# Patient Record
Sex: Male | Born: 1960 | Race: Black or African American | Hispanic: No | Smoking: Never smoker
Health system: Southern US, Community
[De-identification: ages and names within clinical notes are randomized; demographics above are authoritative.]

## PROBLEM LIST (undated history)

## (undated) DIAGNOSIS — I10 Essential (primary) hypertension: Secondary | ICD-10-CM

## (undated) DIAGNOSIS — R519 Headache, unspecified: Secondary | ICD-10-CM

## (undated) DIAGNOSIS — K219 Gastro-esophageal reflux disease without esophagitis: Secondary | ICD-10-CM

## (undated) DIAGNOSIS — G4730 Sleep apnea, unspecified: Secondary | ICD-10-CM

## (undated) DIAGNOSIS — K922 Gastrointestinal hemorrhage, unspecified: Secondary | ICD-10-CM

## (undated) DIAGNOSIS — R079 Chest pain, unspecified: Secondary | ICD-10-CM

## (undated) DIAGNOSIS — N400 Enlarged prostate without lower urinary tract symptoms: Secondary | ICD-10-CM

## (undated) DIAGNOSIS — I4891 Unspecified atrial fibrillation: Secondary | ICD-10-CM

## (undated) DIAGNOSIS — J449 Chronic obstructive pulmonary disease, unspecified: Secondary | ICD-10-CM

## (undated) DIAGNOSIS — D573 Sickle-cell trait: Secondary | ICD-10-CM

## (undated) DIAGNOSIS — I471 Supraventricular tachycardia, unspecified: Secondary | ICD-10-CM

## (undated) DIAGNOSIS — M1990 Unspecified osteoarthritis, unspecified site: Secondary | ICD-10-CM

## (undated) DIAGNOSIS — E041 Nontoxic single thyroid nodule: Secondary | ICD-10-CM

## (undated) DIAGNOSIS — I499 Cardiac arrhythmia, unspecified: Secondary | ICD-10-CM

## (undated) HISTORY — PX: ATRIAL FIBRILLATION ABLATION: EP1191

## (undated) HISTORY — PX: BACK SURGERY: SHX140

## (undated) HISTORY — PX: TRANSURETHRAL RESECTION OF PROSTATE: SHX73

## (undated) HISTORY — PX: TONSILLECTOMY: SUR1361

## (undated) NOTE — *Deleted (*Deleted)
Cardiology Admission History and Physical:   Patient ID: [name] MRN: [id number]; DOB: Jul 22, 1960   Admission date: 02/14/2020  Primary Care Provider: Dois Davenport, MD [facility] Cardiologist: Noah Delaine, MD  [facility] Electrophysiologist:  None ***  Chief Complaint:  ***  Patient Profile:   [name] is a 85 y.o. male with a hx of paroxysmal atrial fibrillation/atrial tachycardia s/p ablation 2018>> redo in 2021 with PVI plus CTI on sotalol and Eliquis, TIA, HTN, and COPD who presented to [facility] 02/14/20 with chest pain.   History of Present Illness:   [name] is a 6yo M with a hx as stated above who presented to [facility] 02/14/20 with chest pain. Per chart review the patient called his cardiologists office today with complaints of chest pain with associated nausea and abdominal pain which began on the morning of presentation.    He has been seen by our service in hospital consultation 04/28/19 for the evaluation of SVT after being admitted with syncope, anemia and GI bleed. His Xarelto has been held. He was noted to have a rapid, narrow complex tachycardia, which is likely SVT. No recurrent afib has been noted. CHADVASC score of 3. Followed by cardiology at [facility]. Echo here shows a hyperdynamic ventricle, probably d/t anemia. BP appears controlled today. He is currently on carvedilol. I agree that we should restart sotalol 40 mg BID and can restart amlodipine for BP control. On exam he is pleasant, in NAD, lungs clear, RRR, s1/s2, no M/R/G's, abdomen soft, non-tender, no LE edema, 2+ pulses. EKG on 1/18 shows normal QTc.  [name] had breakthrough SVT - will restart home sotalol at treatment dose (80 mg BID) and stop coreg. Restart amlodipine (at lower 5 mg dose) for additional BP control as sotalol does not do that. Would recommend follow-up with his cardiologist at [facility] after discharge. Agree with holding off on anticoagulation for at  least 1 week and could consider switch to Eliquis after d/c   Past Medical History:  Diagnosis Date  . A-fib (HCC)   . Chest pain    a. 11/2002 Cath: EF 68%, LM nl, LAD small, nl, LCX nl, RCA tortuous/nl;  b. 08/2011 St Echo: EF 60%, normal contractility, no scar/ischemia.  Marland Kitchen COPD (chronic obstructive pulmonary disease) (HCC)   . Enlarged prostate   . GERD (gastroesophageal reflux disease)   . GI bleeding 08/2018  . Hypertension   . Sickle cell trait (HCC)   . Sleep apnea   . SVT (supraventricular tachycardia) (HCC)     Past Surgical History:  Procedure Laterality Date  . ATRIAL FIBRILLATION ABLATION    . BIOPSY  08/27/2018   Procedure: BIOPSY;  Surgeon: Tressia Danas, MD;  Location: [facility] ENDOSCOPY;  Service: Gastroenterology;;  . BIOPSY  08/28/2018   Procedure: BIOPSY;  Surgeon: Tressia Danas, MD;  Location: [facility] ENDOSCOPY;  Service: Gastroenterology;;  . COLONOSCOPY WITH PROPOFOL N/A 08/28/2018   Procedure: COLONOSCOPY WITH PROPOFOL;  Surgeon: Tressia Danas, MD;  Location: [facility] ENDOSCOPY;  Service: Gastroenterology;  Laterality: N/A;  . ENTEROSCOPY N/A 08/27/2018   Procedure: ENTEROSCOPY;  Surgeon: Tressia Danas, MD;  Location: [facility] ENDOSCOPY;  Service: Gastroenterology;  Laterality: N/A;  . GIVENS CAPSULE STUDY N/A 08/28/2018   Procedure: GIVENS CAPSULE STUDY;  Surgeon: Tressia Danas, MD;  Location: [facility] ENDOSCOPY;  Service: Gastroenterology;  Laterality: N/A;  . LEFT HEART CATHETERIZATION WITH CORONARY ANGIOGRAM N/A 03/11/2013   Procedure: LEFT HEART CATHETERIZATION WITH CORONARY ANGIOGRAM;  Surgeon: Dolores Patty, MD;  [facility] C[facility];  Service: Cardiovascular;  Laterality: N/A;  . POLYPECTOMY  08/28/2018   Procedure: POLYPECTOMY;  Surgeon: Tressia Danas, MD;  Location: [facility] ENDOSCOPY;  Service: Gastroenterology;;  . TONSILLECTOMY    . TRANSURETHRAL RESECTION OF PROSTATE       Medications Prior to Admission: Prior to Admission medications   Medication Sig  Start Date End Date Taking? Authorizing Provider  acetaminophen (TYLENOL) 325 MG tablet Take 650 mg by mouth every 6 (six) hours as needed for moderate pain.    [provider]  amLODipine (NORVASC) 5 MG tablet Take 1 tablet (5 mg total) by mouth daily. 04/30/19 05/30/19  Darlin Drop, DO  apixaban (ELIQUIS) 5 MG TABS tablet Take 1 tablet (5 mg total) by mouth 2 (two) times daily. Take Eliquis 5 mg twice a day one week after GI bleed.  Do not restart Xarelto. 05/06/19   Darlin Drop, DO  cetirizine (ZYRTEC) 10 MG tablet Take 10 mg by mouth daily as needed for allergies.     [provider]  ferrous sulfate 325 (65 FE) MG tablet Take 1 tablet (325 mg total) by mouth daily with breakfast. 04/30/19 06/29/19  Darlin Drop, DO  fluticasone (FLONASE) 50 MCG/ACT nasal spray Place 1 spray into both nostrils daily as needed for allergies or rhinitis.    [provider]  Fluticasone-Salmeterol (ADVAIR) 250-50 MCG/DOSE AEPB Inhale 1 puff into the lungs 2 (two) times daily as needed (wheezing).    [provider]  nitroGLYCERIN (NITROSTAT) 0.4 MG SL tablet Place 1 tablet (0.4 mg total) under the tongue every 5 (five) minutes as needed for chest pain. 03/12/13   Leroy Sea, MD  ondansetron (ZOFRAN) 8 MG tablet Take 8 mg by mouth every 8 (eight) hours as needed for nausea or vomiting.  10/20/18   [provider]  pantoprazole (PROTONIX) 40 MG tablet Take 40 mg by mouth daily. 07/28/14   [provider]  polyethylene glycol (MIRALAX / GLYCOLAX) 17 g packet Take 17 g by mouth daily. 04/30/19   Darlin Drop, DO  sildenafil (REVATIO) 20 MG tablet TAKE 3-5 TABLETS BY MOUTH AS DIRECTED 11/25/18   [provider]  sotalol (BETAPACE) 80 MG tablet Take 1 tablet (80 mg total) by mouth every 12 (twelve) hours. 04/29/19 05/29/19  Darlin Drop, DO  sucralfate (CARAFATE) 1 g tablet Take 1 tablet (1 g total) by mouth 4 (four) times daily -  with meals and at  bedtime. 08/24/19   Rancour, Jeannett Senior, MD  traMADol (ULTRAM) 50 MG tablet Take 50 mg by mouth every 6 (six) hours as needed for moderate pain.    [provider]  gabapentin (NEURONTIN) 300 MG capsule Take 300 mg by mouth at bedtime.  02/26/19  [provider]     Allergies:   No Known Allergies  Social History:   Social History   Socioeconomic History  . Marital status: Divorced    Spouse name: Not on file  . Number of children: Not on file  . Years of education: Not on file  . Highest education level: Not on file  Occupational History  . Not on file  Tobacco Use  . Smoking status: Never Smoker  . Smokeless tobacco: Never Used  Vaping Use  . Vaping Use: Never used  Substance and Sexual Activity  . Alcohol use: No  . Drug use: Never  . Sexual activity: Not on file  Other Topics Concern  . Not on  file  Social History Narrative   Lives in [place] with dtr.   Social Determinants of Health   Financial Resource Strain:   . Difficulty of Paying Living Expenses: Not on file  Food Insecurity:   . Worried About Programme researcher, broadcasting/film/video in the Last Year: Not on file  . Ran Out of Food in the Last Year: Not on file  Transportation Needs:   . Lack of Transportation (Medical): Not on file  . Lack of Transportation (Non-Medical): Not on file  Physical Activity:   . Days of Exercise per Week: Not on file  . Minutes of Exercise per Session: Not on file  Stress:   . Feeling of Stress : Not on file  Social Connections:   . Frequency of Communication with Friends and Family: Not on file  . Frequency of Social Gatherings with Friends and Family: Not on file  . Attends Religious Services: Not on file  . Active Member of Clubs or Organizations: Not on file  . Attends Banker Meetings: Not on file  . Marital Status: Not on file  Intimate Partner Violence:   . Fear of Current or Ex-Partner: Not on file  . Emotionally Abused: Not on file  . Physically Abused: Not  on file  . Sexually Abused: Not on file    Family History:  *** The patient's family history includes CAD in his mother; Multiple myeloma in his brother; Sickle cell anemia in his father. There is no history of Gastric cancer.    ROS:  Please see the history of present illness.  ***All other ROS reviewed and negative.     Physical Exam/Data:   Vitals:   02/14/20 1715 02/14/20 1730 02/14/20 1745 02/14/20 1815  BP: 110/80 99/78 101/80 (!) 118/93  Pulse: (!) 56 (!) 55 (!) 54 (!) 56  Resp: (!) 24 15 17 16   Temp:      TempSrc:      SpO2: 98% 99% 98% 99%  Weight:      Height:       No intake or output data in the 24 hours ending 02/14/20 1844 Last 3 Weights 02/14/2020 02/14/2020 08/23/2019  Weight (lbs) 210 lb 206 lb 216 lb 0.8 oz  Weight (kg) 95.255 kg 93.441 kg 98 kg     Body mass index is 29.29 kg/m.  General:  Well nourished, well developed, in no acute distress*** HEENT: normal Lymph: no adenopathy Neck: no*** JVD Endocrine:  No thryomegaly Vascular: No carotid bruits; FA pulses 2+ bilaterally without bruits  Cardiac:  normal S1, S2; RRR; no murmur *** Lungs:  clear to auscultation bilaterally, no wheezing, rhonchi or rales  Abd: soft, nontender, no hepatomegaly  Ext: no*** edema Musculoskeletal:  No deformities, BUE and BLE strength normal and equal Skin: warm and dry  Neuro:  CNs 2-12 intact, no focal abnormalities noted Psych:  Normal affect    EKG:  The ECG that was done *** was personally reviewed and demonstrates ***  Relevant CV Studies:  Echo 11/26/19:  The left ventricular size is normal.Mild left ventricular hypertrophy Left ventricular systolic function is normal. LV ejection fraction = 55-60%. The left ventricular wall motion is normal. Left ventricular filling pattern is prolonged relaxation. The right ventricle is normal in size and function. The left atrial size is normal. There is no significant valvular stenosis or regurgitation. There was  insufficient TR detected to calculate RV systolic pressure. The aortic sinus is normal size. IVC size was normal. There is  no pericardial effusion. There is no significant change in comparison with the last study.   Laboratory Data:  High Sensitivity Troponin:   Recent Labs  Lab 02/14/20 1514  TROPONINIHS 8      Chemistry Recent Labs  Lab 02/14/20 1514  NA 141  K 3.1*  CL 104  CO2 26  GLUCOSE 86  BUN 15  CREATININE 1.33*  CALCIUM 9.8  GFRNONAA >60  ANIONGAP 11    No results for input(s): PROT, ALBUMIN, AST, ALT, ALKPHOS, BILITOT in the last 168 hours. Hematology Recent Labs  Lab 02/14/20 1514  WBC 6.5  RBC 5.27  HGB 15.2  HCT 45.0  MCV 85.4  MCH 28.8  MCHC 33.8  RDW 12.9  PLT 219   BNPNo results for input(s): BNP, PROBNP in the last 168 hours.  DDimer No results for input(s): DDIMER in the last 168 hours.   Radiology/Studies:  DG Chest Port 1 View  Result Date: 02/14/2020 CLINICAL DATA:  Chest pain for 1 day EXAM: PORTABLE CHEST 1 VIEW COMPARISON:  08/23/2019 FINDINGS: The heart size and mediastinal contours are within normal limits. Both lungs are clear. The visualized skeletal structures are unremarkable. IMPRESSION: No active disease. Electronically Signed   By: Sharlet Salina M.D.   On: 02/14/2020 16:10     Assessment and Plan:   1. ***  {Complete the following score calculators/questions which help meet required metrics.  Press F2         :[id number]}   {Is the patient being seen for CHEST PAIN, UNSTABLE ANGINA, NSTEMI or STEMI?     :[id number]}  {Does this patient have CHF or CHF symptoms?      :[id number]} {Does this patient have ATRIAL FIBRILLATION?:[id number]} Severity of Illness: {Observation/Inpatient:21159}   For questions or updates, please contact [facility] Please consult [email or web address] for contact info under     Signed, Georgie Chard, NP  02/14/2020 6:44 PM

---
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
## 1998-02-15 ENCOUNTER — Encounter: Payer: Self-pay | Admitting: Family Medicine

## 1998-02-15 ENCOUNTER — Ambulatory Visit (HOSPITAL_COMMUNITY): Admission: RE | Admit: 1998-02-15 | Discharge: 1998-02-15 | Payer: Self-pay | Admitting: Family Medicine

## 1998-09-10 ENCOUNTER — Encounter: Payer: Self-pay | Admitting: Emergency Medicine

## 1998-09-11 ENCOUNTER — Inpatient Hospital Stay (HOSPITAL_COMMUNITY): Admission: EM | Admit: 1998-09-11 | Discharge: 1998-09-11 | Payer: Self-pay | Admitting: Emergency Medicine

## 1998-09-12 ENCOUNTER — Inpatient Hospital Stay (HOSPITAL_COMMUNITY): Admission: EM | Admit: 1998-09-12 | Discharge: 1998-09-14 | Payer: Self-pay | Admitting: Emergency Medicine

## 1998-09-13 ENCOUNTER — Encounter: Payer: Self-pay | Admitting: Interventional Cardiology

## 2000-02-02 ENCOUNTER — Emergency Department (HOSPITAL_COMMUNITY): Admission: EM | Admit: 2000-02-02 | Discharge: 2000-02-02 | Payer: Self-pay | Admitting: Emergency Medicine

## 2000-02-05 ENCOUNTER — Encounter: Admission: RE | Admit: 2000-02-05 | Discharge: 2000-02-05 | Payer: Self-pay | Admitting: Family Medicine

## 2000-02-05 ENCOUNTER — Encounter: Payer: Self-pay | Admitting: Family Medicine

## 2000-03-08 ENCOUNTER — Encounter: Payer: Self-pay | Admitting: Family Medicine

## 2000-03-08 ENCOUNTER — Encounter: Admission: RE | Admit: 2000-03-08 | Discharge: 2000-03-08 | Payer: Self-pay | Admitting: Family Medicine

## 2001-02-20 ENCOUNTER — Encounter: Admission: RE | Admit: 2001-02-20 | Discharge: 2001-02-20 | Payer: Self-pay | Admitting: Family Medicine

## 2001-02-20 ENCOUNTER — Encounter: Payer: Self-pay | Admitting: Family Medicine

## 2002-05-05 ENCOUNTER — Ambulatory Visit (HOSPITAL_BASED_OUTPATIENT_CLINIC_OR_DEPARTMENT_OTHER): Admission: RE | Admit: 2002-05-05 | Discharge: 2002-05-05 | Payer: Self-pay | Admitting: Neurology

## 2002-11-16 ENCOUNTER — Encounter: Payer: Self-pay | Admitting: Emergency Medicine

## 2002-11-16 ENCOUNTER — Inpatient Hospital Stay (HOSPITAL_COMMUNITY): Admission: EM | Admit: 2002-11-16 | Discharge: 2002-11-19 | Payer: Self-pay | Admitting: Emergency Medicine

## 2002-11-17 ENCOUNTER — Encounter: Payer: Self-pay | Admitting: Internal Medicine

## 2002-11-20 ENCOUNTER — Inpatient Hospital Stay (HOSPITAL_COMMUNITY): Admission: EM | Admit: 2002-11-20 | Discharge: 2002-11-23 | Payer: Self-pay | Admitting: Emergency Medicine

## 2002-11-20 ENCOUNTER — Encounter: Payer: Self-pay | Admitting: Internal Medicine

## 2002-11-21 ENCOUNTER — Encounter: Payer: Self-pay | Admitting: Internal Medicine

## 2003-10-25 ENCOUNTER — Encounter (INDEPENDENT_AMBULATORY_CARE_PROVIDER_SITE_OTHER): Payer: Self-pay | Admitting: Interventional Cardiology

## 2003-10-26 ENCOUNTER — Inpatient Hospital Stay (HOSPITAL_COMMUNITY): Admission: EM | Admit: 2003-10-26 | Discharge: 2003-10-27 | Payer: Self-pay

## 2004-01-28 ENCOUNTER — Emergency Department (HOSPITAL_COMMUNITY): Admission: EM | Admit: 2004-01-28 | Discharge: 2004-01-28 | Payer: Self-pay | Admitting: Emergency Medicine

## 2004-06-11 DIAGNOSIS — R7301 Impaired fasting glucose: Secondary | ICD-10-CM | POA: Insufficient documentation

## 2007-09-15 DIAGNOSIS — N400 Enlarged prostate without lower urinary tract symptoms: Secondary | ICD-10-CM | POA: Insufficient documentation

## 2007-11-16 ENCOUNTER — Encounter: Admission: RE | Admit: 2007-11-16 | Discharge: 2007-12-16 | Payer: Self-pay | Admitting: Family Medicine

## 2007-11-20 ENCOUNTER — Emergency Department (HOSPITAL_BASED_OUTPATIENT_CLINIC_OR_DEPARTMENT_OTHER): Admission: EM | Admit: 2007-11-20 | Discharge: 2007-11-20 | Payer: Self-pay | Admitting: Emergency Medicine

## 2007-12-16 DIAGNOSIS — E041 Nontoxic single thyroid nodule: Secondary | ICD-10-CM | POA: Insufficient documentation

## 2008-04-18 ENCOUNTER — Emergency Department (HOSPITAL_BASED_OUTPATIENT_CLINIC_OR_DEPARTMENT_OTHER): Admission: EM | Admit: 2008-04-18 | Discharge: 2008-04-18 | Payer: Self-pay | Admitting: Emergency Medicine

## 2008-04-18 ENCOUNTER — Ambulatory Visit: Payer: Self-pay | Admitting: Diagnostic Radiology

## 2009-05-06 DIAGNOSIS — E782 Mixed hyperlipidemia: Secondary | ICD-10-CM | POA: Insufficient documentation

## 2009-05-07 ENCOUNTER — Ambulatory Visit: Payer: Self-pay | Admitting: Radiology

## 2009-05-07 ENCOUNTER — Emergency Department (HOSPITAL_BASED_OUTPATIENT_CLINIC_OR_DEPARTMENT_OTHER): Admission: EM | Admit: 2009-05-07 | Discharge: 2009-05-07 | Payer: Self-pay | Admitting: Emergency Medicine

## 2009-05-09 ENCOUNTER — Emergency Department (HOSPITAL_COMMUNITY): Admission: EM | Admit: 2009-05-09 | Discharge: 2009-05-10 | Payer: Self-pay | Admitting: Emergency Medicine

## 2009-05-17 DIAGNOSIS — J4530 Mild persistent asthma, uncomplicated: Secondary | ICD-10-CM | POA: Insufficient documentation

## 2010-02-23 ENCOUNTER — Encounter: Payer: Self-pay | Admitting: Internal Medicine

## 2010-06-24 LAB — URINALYSIS, ROUTINE W REFLEX MICROSCOPIC
Bilirubin Urine: NEGATIVE
Glucose, UA: NEGATIVE mg/dL
Hgb urine dipstick: NEGATIVE
Ketones, ur: NEGATIVE mg/dL
Nitrite: NEGATIVE
Protein, ur: NEGATIVE mg/dL
Specific Gravity, Urine: 1.012 (ref 1.005–1.030)
Urobilinogen, UA: 0.2 mg/dL (ref 0.0–1.0)
pH: 6 (ref 5.0–8.0)

## 2010-06-27 LAB — POCT CARDIAC MARKERS
CKMB, poc: <1 ng/mL — ABNORMAL LOW (ref 1.0–8.0)
CKMB, poc: <1 ng/mL — ABNORMAL LOW (ref 1.0–8.0)
Myoglobin, poc: 109 ng/mL (ref 12–200)
Myoglobin, poc: 125 ng/mL (ref 12–200)
Troponin i, poc: <0.05 ng/mL (ref 0.00–0.09)
Troponin i, poc: <0.05 ng/mL (ref 0.00–0.09)

## 2010-06-27 LAB — POCT I-STAT, CHEM 8
BUN: 12 mg/dL (ref 6–23)
Calcium, Ion: 1.1 mmol/L — ABNORMAL LOW (ref 1.12–1.32)
Chloride: 106 mEq/L (ref 96–112)
Creatinine, Ser: 1.5 mg/dL (ref 0.4–1.5)
Glucose, Bld: 95 mg/dL (ref 70–99)
HCT: 43 % (ref 39.0–52.0)
Hemoglobin: 14.6 g/dL (ref 13.0–17.0)
Potassium: 3.5 mEq/L (ref 3.5–5.1)
Sodium: 142 mEq/L (ref 135–145)
TCO2: 28 mmol/L (ref 0–100)

## 2010-06-27 LAB — DIFFERENTIAL
Basophils Absolute: 0 10*3/uL (ref 0.0–0.1)
Basophils Relative: 1 % (ref 0–1)
Eosinophils Absolute: 0.1 10*3/uL (ref 0.0–0.7)
Eosinophils Relative: 2 % (ref 0–5)
Lymphocytes Relative: 18 % (ref 12–46)
Lymphs Abs: 1.1 10*3/uL (ref 0.7–4.0)
Monocytes Absolute: 1.1 10*3/uL — ABNORMAL HIGH (ref 0.1–1.0)
Monocytes Relative: 18 % — ABNORMAL HIGH (ref 3–12)
Neutro Abs: 3.6 10*3/uL (ref 1.7–7.7)
Neutrophils Relative %: 62 % (ref 43–77)

## 2010-06-27 LAB — URINALYSIS, ROUTINE W REFLEX MICROSCOPIC
Bilirubin Urine: NEGATIVE
Glucose, UA: NEGATIVE mg/dL
Ketones, ur: NEGATIVE mg/dL
Leukocytes, UA: NEGATIVE
Nitrite: NEGATIVE
Protein, ur: 30 mg/dL — AB
Specific Gravity, Urine: 1.016 (ref 1.005–1.030)
Urobilinogen, UA: 0.2 mg/dL (ref 0.0–1.0)
pH: 6 (ref 5.0–8.0)

## 2010-06-27 LAB — URINE MICROSCOPIC-ADD ON

## 2010-06-27 LAB — CBC
HCT: 42.5 % (ref 39.0–52.0)
Hemoglobin: 14.4 g/dL (ref 13.0–17.0)
MCHC: 33.8 g/dL (ref 30.0–36.0)
MCV: 87.3 fL (ref 78.0–100.0)
Platelets: 188 10*3/uL (ref 150–400)
RBC: 4.86 MIL/uL (ref 4.22–5.81)
RDW: 13.1 % (ref 11.5–15.5)
WBC: 5.9 10*3/uL (ref 4.0–10.5)

## 2010-07-23 LAB — URINE MICROSCOPIC-ADD ON

## 2010-07-23 LAB — URINALYSIS, ROUTINE W REFLEX MICROSCOPIC
Bilirubin Urine: NEGATIVE
Glucose, UA: NEGATIVE mg/dL
Ketones, ur: NEGATIVE mg/dL
Leukocytes, UA: NEGATIVE
Nitrite: NEGATIVE
Protein, ur: NEGATIVE mg/dL
Specific Gravity, Urine: 1.019 (ref 1.005–1.030)
Urobilinogen, UA: 0.2 mg/dL (ref 0.0–1.0)
pH: 5.5 (ref 5.0–8.0)

## 2010-08-24 NOTE — H&P (Signed)
NAMEMarland Kitchen  KEDRON, UNO NO.:  [id number]   MEDICAL RECORD NO.:  [id number]                   PATIENT TYPE:  EMS   LOCATION:  [facility]                                 FACILITY:  [facility]   PHYSICIAN:  Hollice Espy, M.D.            DATE OF BIRTH:  [birth year]   DATE OF ADMISSION:  10/25/2003  DATE OF DISCHARGE:                                HISTORY & PHYSICAL   PRIMARY CARE PHYSICIAN:  Unassigned, patient previously used to see Dr.  Chales Salmon. Abigail Miyamoto but has not seen him for a while and says he is seeing a  new doctor but he cannot remember who.   CHIEF COMPLAINT:  Syncope.   HISTORY OF PRESENT ILLNESS:  This is a 50 year old white malehite male with past  medical history of chest pain and hypertension, who had a normal cardiac  catheterization done in December of 2004 at this hospital and presents with  syncope.  The patient states he has been previously well with no complaints,  no recent illnesses, no sick contacts, when all of a sudden this evening, he  started feeling lightheaded and dizzy.  He denies any nausea or vomiting, he  denied any vertigo symptoms, he denied any chest pain or shortness of breath  but felt like he had to sit down.  He was given some water, which he took a  few sips of this.  He tried to then stand up and walk; the next thing he  remembers, he remembers just going down.  He is not sure for the length of  time he was out, however, family was present and did attend to him.  He does  not remember much about the events after that.  He denied any problems with  slurred speech or weakness or confusion, but just has very little memory of  the incident.  Unfortunately, the family is not present in the emergency  room, nor the attending ER physician who saw the patient.  The patient was  transported to [facility].  An EKG done showed normal sinus rhythm  with a left anterior fascicular block, which, when compared to previous  EKGs,  appears normal.  The patient's vitals were checked and he was with a  heart rate of 71, with a blood pressure of 136/90 on admission; these did  not change.  Orthostatics were checked and found to be normal.  CT of the  head as well as chest x-ray were checked and found to be normal.  The  patient had no elevated white count; he did have a slightly low potassium of  2.7 but the rest of his electrolytes were normal and there was nothing else  of regard.  The patient was admitted, started on IV fluids and currently he  states he is feeling okay.  He denies any complaints.  He denies any  headaches, visual changes, chest pain, palpitations, shortness of breath,  wheeze, cough, abdominal pain, hematuria, dysuria, constipation, diarrhea or  weakness.  He just tells me that he is tired, however, it is midnight.  The  patient also reports that he has had intermittent episodes of chest pain  going on for the past few months but these have been never long-lasting and  he did not have any episodes today.   PAST MEDICAL HISTORY:  1. Past medical history is for previous episode of chest pain, status post     cardiac catheterization done in December which was completely normal.  2. He also has a history of hypertension.  3. The patient has a reported history of TIAs seen on previous discharge     summaries; he did not mention a history of TIAs.   MEDICATIONS:  The patient tells me he takes 2 blood pressure agents; one is  hydrochlorothiazide, the other he cannot remember the name of.   ALLERGIES:  He has no known drug allergies.   SOCIAL HISTORY:  He denies any tobacco, alcohol or drug use.   FAMILY HISTORY:  He has had a mother and grandmother with CAD but no one at  a young age.   PHYSICAL EXAM:  VITAL SIGNS:  Patient's temperature 98.6, heart rate 71,  blood pressure 136/90, respirations 18, O2 saturation 100% on 2 L.  GENERAL:  In general, he is alert and oriented, in no apparent distress.   HEENT:  Normocephalic, atraumatic.  His mucous membranes are moist.  NECK:  He has no carotid bruits.  HEART:  Regular rate and rhythm, S1 and he has a loud S2.  LUNGS:  Lungs are clear to auscultation bilaterally.  ABDOMEN:  Abdomen is soft, nontender and nondistended.  Positive bowel  sounds.  EXTREMITIES:  No clubbing, cyanosis or edema.  NEUROLOGICAL:  His cranial nerves are all intact.  He has no focal extremity  deficits or weaknesses.   LABORATORY WORK:  CT of his head and chest x-ray are both normal.   Sodium 138, potassium 2.7, chloride 108, bicarb 22, BUN 10, creatinine 1.3,  glucose 101.  His LFTs are all within normal limits.  White count 7.7,  hemoglobin and hematocrit 14.2 and 41.8, MCV of 85, platelet count 226,000;  he has no shift.  CPK and MB are 96.3 and 1.1; second set 115 and 1.4; both  troponins are less than 0.05.   ASSESSMENT AND PLAN:  1. Syncope.  This may be some mild dehydration secondary to his     antihypertensives.  In talking to him, I feel that he does not supposedly     follow with a primary care physician and likely just takes his scheduled     doses of medicine which he says have been good for him for the past few     years.  Will admit for a 24-hour observation, check carotid Dopplers and     echocardiogram, as there are no reports of these in his chart, to rule     out stenosis or arrhythmia, but likely this may be from blood pressure     agents alone.  We will continue to check his vitals as well.  2. In regards to his chest pain episodes, even though with his previous     history and a normal catheterization back in December, I would not pursue     this further for right now.  3. In regards to hypokalemia, I will go ahead and replace.  Hollice Espy, M.D.    SKK/MEDQ  D:  10/25/2003  T:  10/25/2003  Job:  [id number]

## 2010-08-24 NOTE — Cardiovascular Report (Signed)
   NAMEVIDAL, LAMPKINS NO.:  [id number]   MEDICAL RECORD NO.:  [id number]                   PATIENT TYPE:  INP   LOCATION:  4733                                 FACILITY:  [facility]   PHYSICIAN:  Salvadore Farber, M.D.             DATE OF BIRTH:  [birth year]   DATE OF PROCEDURE:  11/18/2002  DATE OF DISCHARGE:                              CARDIAC CATHETERIZATION   PROCEDURE:  Left heart catheterization, left ventriculography, coronary  angiography.   INDICATION:  Mr. 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  No prior history of  cardiovascular disease.  He was admitted to hospital complaining of chest  discomfort for five days.  He evolved inferior T-wave inversions and based  on this based on this electrocardiographic abnormality, his complaints of  chest pain, he was  referred for diagnostic angiography.   PROCEDURAL TECHNIQUE:  Informed consent was obtained.  Under 1% lidocaine  local anesthesia, a 6 French sheath was placed in the left femoral artery  using the modified Seldinger technique.  Diagnostic angiography and  ventriculography were performed using JL-4, Amplatz left I and pigtail  catheters.  The patient tolerated the procedure well and was transferred to  the holding room in stable condition.   COMPLICATIONS:  None.   FINDINGS:  1. LV:  128/3/14. EF 68% without regional wall motion abnormality.  2. No aortic stenosis or mitral regurgitation.  3. Left main:  Angiographically normal.  4. LAD:  Small vessel which does not reach the apex of the heart.  It gives     rise to two diagonal branches.  It is angiographically normal.  5. Circumflex:  Moderate size vessel giving rise to two obtuse marginals.     It is angiographically normal.  6. RCA:  Large, dominant vessel with a high anterior takeoff.  The vessel     was tortuous, but angiographically normal.    IMPRESSION/RECOMMENDATIONS:  1. Angiographically normal coronary arteries.  Suspect noncardiac etiology     to this chest discomfort.  2. Normal left ventricular size and systolic function.  3. No aortic stenosis or mitral regurgitation.                                                 Salvadore Farber, M.D.    WED/MEDQ  D:  11/18/2002  T:  11/18/2002  Job:  [id number]   cc:   Chales Salmon. Abigail Miyamoto, M.D.  [address]  [place]  [place] [address]  Fax: [phone]   Jonelle Sidle, M.D. [facility]

## 2010-08-24 NOTE — Discharge Summary (Signed)
[name], [name] NO.:  [id number]   MEDICAL RECORD NO.:  [id number]                   PATIENT TYPE:  INP   LOCATION:  4733                                 FACILITY:  [facility]   PHYSICIAN:  [name], M.D.               DATE OF BIRTH:  [birth year]   DATE OF ADMISSION:  11/16/2002  DATE OF DISCHARGE:  11/19/2002                                 DISCHARGE SUMMARY   CHIEF COMPLAINT:  Chest pains, headache, cough.   CONTINUITY DOCTOR:  [name], M.D.  Phone number [phone].  She is with  [facility].   CONSULTS:  Cardiology by [name].   DISCHARGE DIAGNOSES:  1. Non-cardiac chest pain.  2. Headache of unknown etiology with negative CT scan.  3. Toxic encephalopathy.  4. Constipation.  5. Hypertension.  6. Hemorrhoids.  7. History of tobacco abuse.  8. History of transient ischemic attack.   DISCHARGE MEDICATIONS:  1. Aspirin 81 mg p.o. daily.  2. Cozaar 50 mg p.o. daily.  3. Colace 100 mg p.o. b.i.d.  4. Pepcid 20 mg b.i.d.  5. Tylenol Extra-Strength over-the-counter one to two t.i.d. for headache.   FOLLOWUP:  The patient will schedule appointment with Dr. Eileen Cox at  [facility].  Her phone number is [phone].  The patient was advised to  schedule this appointment within the next few work days.  During this  appointment, the patient's pain due to headaches should be addressed.  The  patient would like something stronger then Tylenol for his almost daily  headache.   The patient also needs followup for his high blood pressure.  His blood  pressure on discharge was 137/92.  He was discharged on Cozaar as well as  his home medication.  The patient states that he cannot remember, but one of  his medications was about to run out.  He was advised to refill that  medication with his new primary care physician during his appointment.   The patient's cause of chest pain was not clear at discharge.  The patient  was  started on Pepcid to treat potential gastroesophageal regurgitation  disease.  At this point, the patient denies symptoms, but his chest pain  could be actually caused by that.   The patient is to follow up with Dr. Montez Cox concerning his ongoing chest  pain in order to determine the potentially non-cardiac causes.    PROCEDURES PERFORMED:  1. Cardiac catheterization performed on November 18, 2002, showed no cardiac     disease, no signs of ischemia, no aortic stenosis, and marked mitral     regurgitation.  Normal left ventricular size and systolic function.  2. CT scan of the head which was negative for bleeding, masses, or signs of     infarction.  3. Chest x-ray on November 16, 2002, which was unremarkable.   CONSULTATIONS:  During his stay, the patient  has been seen by [name],  Department of Cardiology.   ADMITTING HISTORY OF PRESENT ILLNESS:  [name] is a 50 year old African-  American male with a past history of hypertension and distant history of  tobacco abuse, who presented to Urgent Care with five days of sharp,  pinching, left chest pain that lasted about five minutes.  It was on and off  throughout the day, and seems to be worsened by exertion and relieved by  rest.  The patient also reported occasional nausea and diaphoresis which  have occurred together with his pain.  The pain has radiated to his jaw,  neck, and left arm.  The patient denied shortness of breath, dyspnea on  exertion, orthopnea, or paroxysmal nocturnal dyspnea, or any history of  edema.  The patient does report that he has been feeling fatigued for the  past five days as well as having had cough and increased urination.   The patient reports having pressure-like pain that occurs about once a month  and has been going on for the past eight years.  He had been seen prior to  this at [facility] and had cardiac catheterization which showed no  artery disease at that point.  The patient has  also reported a one-month  history of shooting headache which is relieved by Tylenol but associated  with dizziness and blurry vision and occasionally wakes him up at night.   The patient reports having a cough about one week with no upper respiratory  signs.  He has been afebrile, denies any sick contacts.  He does report  chest pain with cough, but this is a different feeling from what he has been  experiencing.   The patient states that for the past week he has had increased urination in  frequency and volume.  No dribbling, no pressure, no dysuria, no abdominal  pain, no hematuria.  On admission to the ED, he had his cardiac enzymes  drawn which were negative.  He had an EKG done which has T-wave changes as  well as possible Q-wave in lead aVL.  He also had rapid repolarization  versus raised ST segments in leads aVL, aVF, and aVR.  There was also  noticed a nonspecific intraventricular conduction loss.   The patient was given nitroglycerin, but reported he developed severe  headache with that and nitroglycerin was stopped.  At this point, the  patient was ready for morphine 1 mg for relief of chest pain.   On admission, his vital signs showed a pulse of 66, blood pressure 148/99,  temperature 98.6, respirations 20, O2 sat 99% on room air.   PHYSICAL EXAM:  CARDIAC EXAM:  Normal.  Normal S1 and S2 sounds.  No  murmurs, rubs, or gallops.  No bruits.  He had excellent radial, dorsalis  pedis, and posterior tibialis pulses.  PULMONARY EXAM:  Clear.  ABDOMINAL EXAM:  Positive for generalized tenderness.  The patient had no  edema.  MUSCULOSKELETAL EXAM:  Within normal limits.  NEUROLOGICAL EXAM:  Within normal limits.   LABS ON ADMISSION:  His sodium was 141, potassium 3.7, glucose 96.  The  patient had a white blood cell count of 6.7, hemoglobin 14.6, hematocrit of  42.5, and platelet count of 231.  The patient's ABG looked strong and showed a pH of 7.42, pCO2 of 42.1, total CO2  of 29.  His three sets of cardiac  enzymes were unremarkable.  The patient had an urinalysis which was  negative, urine cultures have not produced any growth up to this date, not  grown any bacteria.  The patient has been admitted to telemetry to rule out  potential myocardial infarction.   HOSPITAL COURSE:  1. Chest pain:  At admission, the patient's cardiac enzymes have shown CK     elevation up to 247, his troponin levels never rose above 0.01.  Due to     the patient's cough and history of chest pain, a chest x-ray has been     done that has shown no pulmonary process.  Also noted to rule out     possible pulmonary emboli, and D-dimer was checked and it was     unremarkable.  The patient's BNP had been drawn and was less then 30.     Throughout his stay, the patient continued to have chest pain every day.     He was on continuous telemetry, and 12-lead EKG has been ordered on     numerous occasions, but has not shown any changes from his initial EKG.     The patient was started on heparin, aspirin, beta blocker, which was     Lopressor, as well as continued on his Cozaar, his home medications of     hydrochlorothiazide and diltiazem have been stopped at this point.   Prior to initiation of heparin, the patient has received a CT of the head to  rule out possible bleeding.  That was done on November 17, 2002.  The CT scan  showed no signs of bleeding or intracranial mass.  The patient's lipid panel  has been checked and shown to have LDL of 115.  On November 18, 2002, the  patient had undergone a cardiac catheterization that showed no coronary  artery disease, normal left ventricular function, there was no evidence of  valvular disease.  The patient continued to have chest pain the next day.  At this point, another EKG was done which again showed no changes.  The  patient has been explained that his cause of pain was probably non-cardiac  in origin.  He has been discharged to follow up  with his new primary care  physician, [name].  Instructed to address other possible causes of his  chest pain on his follow up visit.   The patient has been placed on Pepcid 20 mg b.i.d. in order to treat  potential gastroesophageal reflux disease, it could one of potential causes  for his chest pain.   1. History of headaches: Throughout his hospital stay, the patient continued     to have daily headaches, about two per day.  The patient also reported     feeling faint occasionally with a headache.  A CT scan of his head was     shown to be unremarkable.  The patient was discharged with instructions     to follow up for his pain control due to headaches with his primary care     physician.  He was also instructed not to take more then eight Extra     Strength Tylenol pills per day.   1. Hypertension: Throughout the hospital stay, the patient had been     normotensive except for two occasions, one on admission.  His blood    pressure has been 148/99, and one more time on the day of discharge his     diastolic pressure was up to 92.  Throughout his hospital stay, the     patient has remained on  Cozaar 50 mg daily as well as metoprolol 12.5 mg     b.i.d. __________.  The patient has been instructed to follow up with his     primary care physician concerning his history of hypertension in order to     develop an appropriate prescription regimen.   1. Constipation: The patient has reported that he has had no stools for     three days.  He was given Colace 100 mg b.i.d.  After this, his     constipation has resolved.  He was discharged home on a Colace regimen to     control further bouts of constipation.  This seems to be a chronic     problem for him.   1. Cough: The patient had presented to the emergency department with a     history of five days of cough.  He had a chest x-ray done to rule out     infectious sources of his chest pain which was negative.  On the second     day  of admission, his cough has resolved and the patient has been     asymptomatic throughout his stay.   DISCHARGE LABORATORY VALUES:  The patient's hemoglobin on discharge was  13.7.  Hematocrit was 40.1. White blood cell count 9.1, platelets 239, MCV  of 85.2.  The patient's sodium was 140, potassium of 3.6, his creatinine was  1.3, and his BUN was 11.  The patient's PTT on the 13th of August was 30.  His lipid profiles have shown cholesterol of 167, his total triglycerides  were 114, his HDL was 29, and his LDL was 150.  The patient has  had a CT of the head done on August 11th which was unremarkable.  He had a  chest x-ray done on August 10th which showed no pulmonary disease.  His  urine culture from the 10th of August showed no growth.   DICTATED BY:  Salomon Mast, M.D.                        [name], M.D.    WA/MEDQ  D:  11/19/2002  T:  11/20/2002  Job:  [id number]   cc:   [name], M.D.  [facility]

## 2010-08-24 NOTE — Discharge Summary (Signed)
   [name], [name]                          ACCOUNT NO.:  [id number]   MEDICAL RECORD NO.:  [id number]                   PATIENT TYPE:  INP   LOCATION:  2002                                 FACILITY:  [facility]   PHYSICIAN:  Yuji Walth dictator                    DATE OF BIRTH:  [birth year]   DATE OF ADMISSION:  11/19/2002  DATE OF DISCHARGE:                                 DISCHARGE SUMMARY   ADDENDUM:  Please send his discharge summary Dr. Brandon Melnick, phone number  [phone].                                                Cyanne Delmar dictator    /MEDQ  D:  11/19/2002  T:  11/20/2002  Job:  [id number]

## 2010-08-24 NOTE — Discharge Summary (Signed)
NAMETRAVARES, NELLES NO.:  [id number]   MEDICAL RECORD NO.:  [id number]                   PATIENT TYPE:  INP   LOCATION:  4733                                 FACILITY:  [facility]   PHYSICIAN:  Ileana Roup, M.D.               DATE OF BIRTH:  Aug 18, 1960   DATE OF ADMISSION:  11/16/2002  DATE OF DISCHARGE:  11/19/2002                                 DISCHARGE SUMMARY   DISCHARGE DIAGNOSES:  1. Noncardiac chest pain, status post heart catheterization which was     angiographically normal.  2. Hypertension.  3. History of hemorrhoids.  4. Previous tobacco abuse 18 years ago.  5. History of mini-strokes per patient history.  6. History of recent headaches.   DISCHARGE MEDICATIONS:  1. Aspirin 81 mg p.o. daily.  2. Cozaar 50 mg daily.  3. Colace 100 mg b.i.d.  4. Pepcid 20 mg b.i.d.   FOLLOWUP:  The patient will follow up with her primary care doctor, Dr.  Eileen Stanford, at [facility].   CONSULTATIONS:  Cardiology, Dr. Jonelle Sidle.   PROCEDURES:  1. Coronary catheterization angiographically normal.  2. CT of the head which was normal.   LABORATORIES:  Hemoglobin 14.6, hematocrit 42.5.  BUN and creatinine 10 and  1.2.  Cardiac enzymes negative.  Lipid profile:  Cholesterol 167,  triglycerides 114, HDL 29, LDL 115.   HISTORY OF PRESENT ILLNESS:  The patient is a 50 year old African American  male with a history of hypertension, who presented to the emergency  department with a five-day history of intermittent substernal chest pain  with exertion that radiated to his left arm and neck.  The patient reports  that it was occasionally accompanied by diaphoresis and nausea, though this  was unreliable.  He denied any shortness of breath with the episode.  He  went to [facility] on the day of admission to be evaluated and was sent to  the emergency department for further management.  He reported that he had a  negative cath eight years, and had a  normal stress test three to four years  ago.  He reports that this pain was similar to the chest pain that he had  eight years ago prompting coronary catheterization.  He is currently a  nonsmoker and has been for 18 years and denies diabetes.   HOSPITAL COURSE:  PROBLEM #1 - CHEST PAIN:  The patient was admitted to a  telemetry bed, started on a beta blocker as well as morphine, O2 and  sublingual nitroglycerin for chest pain.  Heparin was not immediately begun  because the patient was also complaining of new headaches and CT was  performed prior to starting heparin, however, heparin was started.  The  patient ultimately did undergo a coronary catheterization which was  angiographically normal.  His chest pain was felt to be noncardiac in  nature.   PROBLEM #2 - HEADACHES:  The patient had complained of headaches over the  past few days.  It had increased and was worried about these.  A  CT scan of  his head was performed which was negative.  These headaches were ultimately  thought to be secondary to migraine or tension-type headaches.   PROBLEM #3 - HYPERTENSION:  He will continue his Cozaar but no additions  were made; his blood pressure remained stable during his hospitalization.   PROBLEM #4 - DYSLIPIDEMIA:  The patient has an elevated LDL cholesterol of  115.  He will be referred to his primary care physician, who will decide  whether or not to start him on a statin.      Will Averett, M.D.                        Ileana Roup, M.D.    WA/MEDQ  D:  03/15/2003  T:  03/16/2003  Job:  [id number]

## 2010-08-24 NOTE — Consult Note (Signed)
NAME:  [name], [name] NO.:  [id number]   MEDICAL RECORD NO.:  [id number]                   PATIENT TYPE:  INP   LOCATION:  4733                                 FACILITY:  [facility]   PHYSICIAN:  Jonelle Sidle, M.D. [facility]        DATE OF BIRTH:  1960/08/24   DATE OF CONSULTATION:  DATE OF DISCHARGE:                                   CONSULTATION   REASON FOR CONSULTATION:  Chest pain.   HISTORY OF PRESENT ILLNESS:  The patient is a pleasant 50 year old male with  a history of hypertension, remote tobacco use and no prior definitive  history of coronary artery disease or myocardial infarction.  He reports a  normal stress test and cardiac catheterization approximately eight years  ago in [place].  He is now admitted following the onset of chest discomfort  approximately six days ago that has been intermittent and related largely  with exertion.  He describes this as a pressure sensation that radiates into  his neck and left arm down to his fourth and fifth fingers.  The symptoms  last typically for a few minutes and are seemingly resolved mostly with  rest.  He has also had some sharp shooting headache pains not necessarily  related to the same chest discomfort, but also over the last week.  He  apparently has a prior history of toxic encephalopathy reportedly due to  environmental exposure.  The specifics on this are not clear at this time.  He also has a previous history of TIA's and has been on aspirin chronically.  He has had recurrent pain since hospitalization although his Troponin I  levels have been normal on three occasions.  Interestingly, his twelve lead  electrocardiogram is abnormal showing on the [facility] tracing ST elevation  in the inferior leads with nonspecific lateral changes and more consistently  in the hospital focal ST elevation in leads 1 and AVL with T wave inversions  in the inferolateral leads.  There have been no specific  changes on serial  tracings. We have been asked to evaluate further.   ALLERGIES:  No contrast dye or iodine allergies.   MEDICATIONS AT HOME:  1. Hydrochlorothiazide.  2. Diltiazem.  3. Aspirin.  4. Cozaar.  5. Vitamin E.  6. Tylenol p.r.n.   PRESENT MEDICATIONS:  1. Lopressor 12.5 mg p.o. b.i.d.  2. Aspirin 81 mg p.o. daily.  3. Losartan 50 mg p.o. daily.  4. Tylenol p.r.n.  5. Sublingual nitroglycerin p.r.n.   PAST MEDICAL HISTORY:  1. No definitive history of coronary artery disease with reportedly normal     stress test and cardiac catheterization approximately eight years ago in     [place].  2. History of hypertension.  3. No clear history of dyslipidemia.  4. Reportedly history of toxic neuropathy/encephalopathy.  This was     apparently diagnosed by an environmental physician and is felt to be due  to some type of environmental exposure.  He apparently worked in the [employer].  The specifics on this are not clear.  5. History of headaches in the past.  6. Reported history of transient ischemic attacks.   SOCIAL HISTORY:  The patient is married and has five children.  He  previously worked in [employer].  He has a prior history of tobacco use  approximately 20 years ago and denies any significant alcohol use.   FAMILY HISTORY:  Noncontributory for premature cardiovascular disease.   REVIEW OF SYMPTOMS:  As described in the history of present illness.   PHYSICAL EXAMINATION:  VITAL SIGNS: Today his temperature is 97.5 degrees,  blood pressure is 129/82, heart rate is 62 and regular, oxygen saturation is  94% on room air.  GENERAL:  This is a well nourished male lying supine in no acute distress.  HEENT:  Conjunctivae and lids are normal.  Oropharynx is clear.  The neck is  supple without elevated jugular venous pressure or carotid bruits.  There is  no thyromegaly noted.  LUNGS:  Clear to auscultation with normal respiratory effort at  rest.  CARDIAC EXAM:  Regular rate and rhythm without S3 gallop or significant  murmur. There is no clear pericardial rub both supine and leaning forward.  ABDOMEN:  Soft without hepatomegaly or bruits.  EXTREMITIES:  No cyanosis, clubbing or edema.  Peripheral pulses are 2+.  SKIN:  No ulcerative changes noted.  MUSCULOSKELETAL:  No abnormalities are noted.  NEUROPSYCHIATRIC:  The patient is alert and oriented times three.   LABORATORY DATA:  WBC 7.8, hemoglobin 13.6, hematocrit 40.1, platelets  217,000.  Sodium 140, potassium 3.5, BUN 12, creatinine 1.3. D-dimer is  0.26.  Troponin I levels have been negative on four occasions with the peak  being 0.01 and peak CK is 247 with an MB relative index of 0.4.  Total  cholesterol 167, triglycerides 114, HDL 29 and LDL is 115.   Chest x-ray from August 10 is reported as showing low volumes with bibasilar  atelectasis.   IMPRESSION:  1. Chest pain syndrome in a 50 year old male with a remote history of     tobacco use, mild dyslipidemia, hypertension, prior history of transient     ischemic attacks and an abnormal electrocardiogram at rest.  Cardiac     markers are negative against a high risk of acute coronary syndrome. He     continues to have recurrent symptomatology, however. He has had no recent     testing since a reportedly normal stress test and cardiac catheterization     eight years ago.  2. Recent headaches as described.  3. Hypertension.  4. Dyslipidemia with an LDL cholesterol of 115.   RECOMMENDATIONS:  1. It sounds as if a CT scan of the head is planned to assess for any acute     central nervous system event.  I agree with this and if no acute bleeding     or abnormalities are seen, would consider starting heparin or Lovenox for     the time-being.  2. I discussed further diagnostic testing with the patient and his wife     including potentially repeating the stress test    versus definitive coronary angiography.  At  this point, we will plan to     proceed tomorrow with cardiac catheterization to clearly define the     coronary anatomy.  3. Further recommendations to follow.  Jonelle Sidle, M.D. [facility]    SGM/MEDQ  D:  11/17/2002  T:  11/17/2002  Job:  [id number]   cc:   Ileana Roup, M.D.  [address], [place] [address]  Fax: [phone]

## 2010-08-24 NOTE — Discharge Summary (Signed)
[name], [name]                          ACCOUNT NO.:  [id number]   MEDICAL RECORD NO.:  [id number]                   PATIENT TYPE:  OBV   LOCATION:  4739                                 FACILITY:  [facility]   PHYSICIAN:  Deirdre Peer. Polite, M.D.              DATE OF BIRTH:  [birth year]   DATE OF ADMISSION:  10/24/2003  DATE OF DISCHARGE:  10/27/2003                                 DISCHARGE SUMMARY   DISCHARGE DIAGNOSES:  1. Syncope most likely secondary to vasovagal, evaluation negative for other     cause this hospitalization.  Orthostatics negative, CT of the head     negative, carotid arteries negative, two-dimensional echocardiogram with     preserved LV function.  2. Hypertension, borderline control, recommend continue same home medicines     with follow-up with M.D. in one week.  3. Hypokalemia secondary to hydrochlorothiazide, K-Dur 20 mEq daily has been     added.  4. Muscular chest pain, no evaluation after the patient has had negative     cardiac catheterization in August of 2004 with normal coronaries and     normal LV function.  5. Chronic pain in the low back, chest wall area.   DISCHARGE MEDICATIONS:  The patient is asked to resume home medicines.  1. Tiazac 20 to 40 mg daily.  2. Hydrochlorothiazide 25 mg p.o. daily.  3. K-Dur 20 mEq p.o. daily.   DISPOSITION:  The patient was discharged to home in stable condition.   STUDIES:  The patient had two-dimensional echocardiogram which showed EF of  55 to 65%, no wall motion abnormality, did show increased wall thickening  LV.  Carotid Doppler, no significant intracranial artery stenosis  bilaterally.  CT of the head negative. Chest x-ray negative.   HISTORY OF PRESENT ILLNESS:  A 50 year old male with above medical problems  who presented to the ED for evaluation after syncopal spell in church.  It  was noted that the patient had been standing up for half an hour to an hour  praying and at that time began to feel  dizzy and lightheaded which made him  feel like he needed to sit down.  However, did get up immediately after and  at that time had a syncopal spell.  The patient was transferred to the ED  for further evaluation.  At that time, the patient's lytes were negative for  hypokalemia, glucose within normal limits, orthostatics negative.  Admission  was deemed necessary for further evaluation and treatment.  Please see  dictated H&P for further details.   PAST MEDICAL HISTORY:  As stated above.   MEDICATIONS:  1. Tiazac.  2. Hydrochlorothiazide.  3. Unknown pain medicine.   SOCIAL HISTORY:  Negative for tobacco, alcohol, or drugs.   FAMILY HISTORY:  Noncontributory.   HOSPITAL COURSE:  The patient is admitted to a medicine floor bed for  evaluation and treatment of syncope.  The  patient had CT of the head which  was negative, two-dimensional echocardiogram without pathologic findings.  The patient was continued on a monitor without any event arrhythmia or  pulse.  The patient's blood pressure medicines were resumed after having  negative orthostatics x2 days.  The patient ambulated without difficulty.  The patient did complain of some nonspecific chest wall pain during his  hospitalization which was reproducible with palpation.  Please note, the  patient has had negative cardiac catheterization in August of 2004 with  normal coronary arteries, therefore, no further cardiac evaluation was done  during this hospitalization.  At this time, the patient is stable for  discharge to home, felt the cause of his syncope is vasovagal secondary to  prolonged standing in church and praying.  Also please note that the church  is very warm during that time.  The patient has other medical problems and  he will continue his current outpatient medicines.                                                Deirdre Peer. Polite, M.D.    RDP/MEDQ  D:  10/27/2003  T:  10/28/2003  Job:  [id number]   cc:   Dr.  Barnett Abu  [place], [place]

## 2011-01-04 LAB — URINALYSIS, ROUTINE W REFLEX MICROSCOPIC
Bilirubin Urine: NEGATIVE
Glucose, UA: NEGATIVE
Hgb urine dipstick: NEGATIVE
Ketones, ur: NEGATIVE
Nitrite: NEGATIVE
Protein, ur: NEGATIVE
Specific Gravity, Urine: 1.014
Urobilinogen, UA: 0.2
pH: 7

## 2011-05-05 DIAGNOSIS — R7303 Prediabetes: Secondary | ICD-10-CM | POA: Insufficient documentation

## 2011-08-08 ENCOUNTER — Encounter: Payer: Self-pay | Admitting: Internal Medicine

## 2011-08-20 ENCOUNTER — Other Ambulatory Visit (HOSPITAL_COMMUNITY): Payer: Self-pay | Admitting: Family Medicine

## 2011-08-21 ENCOUNTER — Encounter: Payer: Self-pay | Admitting: Internal Medicine

## 2011-08-21 ENCOUNTER — Ambulatory Visit (HOSPITAL_BASED_OUTPATIENT_CLINIC_OR_DEPARTMENT_OTHER): Payer: Medicare Other

## 2011-08-21 ENCOUNTER — Ambulatory Visit (HOSPITAL_COMMUNITY): Payer: Medicare Other | Attending: Cardiology

## 2011-08-21 DIAGNOSIS — Z8249 Family history of ischemic heart disease and other diseases of the circulatory system: Secondary | ICD-10-CM | POA: Insufficient documentation

## 2011-08-21 DIAGNOSIS — I10 Essential (primary) hypertension: Secondary | ICD-10-CM | POA: Insufficient documentation

## 2011-08-21 DIAGNOSIS — R5381 Other malaise: Secondary | ICD-10-CM | POA: Insufficient documentation

## 2011-08-21 DIAGNOSIS — R079 Chest pain, unspecified: Secondary | ICD-10-CM | POA: Insufficient documentation

## 2011-08-21 DIAGNOSIS — R072 Precordial pain: Secondary | ICD-10-CM

## 2011-08-21 DIAGNOSIS — R0989 Other specified symptoms and signs involving the circulatory and respiratory systems: Secondary | ICD-10-CM

## 2011-08-21 DIAGNOSIS — F17200 Nicotine dependence, unspecified, uncomplicated: Secondary | ICD-10-CM | POA: Insufficient documentation

## 2011-09-23 ENCOUNTER — Ambulatory Visit: Payer: Medicare Other | Attending: Family Medicine | Admitting: Physical Therapy

## 2011-09-23 DIAGNOSIS — M545 Low back pain, unspecified: Secondary | ICD-10-CM | POA: Insufficient documentation

## 2011-09-23 DIAGNOSIS — M25569 Pain in unspecified knee: Secondary | ICD-10-CM | POA: Insufficient documentation

## 2011-09-23 DIAGNOSIS — M542 Cervicalgia: Secondary | ICD-10-CM | POA: Insufficient documentation

## 2011-09-23 DIAGNOSIS — IMO0001 Reserved for inherently not codable concepts without codable children: Secondary | ICD-10-CM | POA: Insufficient documentation

## 2011-09-25 ENCOUNTER — Ambulatory Visit: Payer: Medicare Other | Admitting: Rehabilitation

## 2011-09-30 ENCOUNTER — Ambulatory Visit: Payer: Medicare Other | Admitting: Physical Therapy

## 2011-10-01 ENCOUNTER — Ambulatory Visit: Payer: Medicare Other | Admitting: Physical Therapy

## 2011-10-03 ENCOUNTER — Ambulatory Visit: Payer: Medicare Other | Admitting: Physical Therapy

## 2011-10-08 ENCOUNTER — Ambulatory Visit: Payer: Medicare Other | Attending: Family Medicine | Admitting: Physical Therapy

## 2011-10-08 DIAGNOSIS — M545 Low back pain, unspecified: Secondary | ICD-10-CM | POA: Insufficient documentation

## 2011-10-08 DIAGNOSIS — IMO0001 Reserved for inherently not codable concepts without codable children: Secondary | ICD-10-CM | POA: Insufficient documentation

## 2011-10-08 DIAGNOSIS — M542 Cervicalgia: Secondary | ICD-10-CM | POA: Insufficient documentation

## 2011-10-08 DIAGNOSIS — M25569 Pain in unspecified knee: Secondary | ICD-10-CM | POA: Insufficient documentation

## 2011-10-15 ENCOUNTER — Ambulatory Visit: Payer: Medicare Other | Admitting: Physical Therapy

## 2011-10-16 ENCOUNTER — Ambulatory Visit: Payer: Medicare Other | Admitting: Physical Therapy

## 2011-10-17 ENCOUNTER — Ambulatory Visit: Payer: Medicare Other | Admitting: Physical Therapy

## 2011-10-21 ENCOUNTER — Ambulatory Visit: Payer: Medicare Other | Admitting: Physical Therapy

## 2011-10-24 ENCOUNTER — Ambulatory Visit: Payer: Medicare Other | Admitting: Physical Therapy

## 2011-10-29 ENCOUNTER — Ambulatory Visit: Payer: Medicare Other | Admitting: Physical Therapy

## 2011-10-31 ENCOUNTER — Ambulatory Visit: Payer: Medicare Other | Admitting: Physical Therapy

## 2011-11-05 ENCOUNTER — Ambulatory Visit: Payer: Medicare Other | Admitting: Physical Therapy

## 2011-11-07 ENCOUNTER — Ambulatory Visit: Payer: 59 | Attending: Family Medicine | Admitting: Physical Therapy

## 2011-11-07 DIAGNOSIS — M542 Cervicalgia: Secondary | ICD-10-CM | POA: Insufficient documentation

## 2011-11-07 DIAGNOSIS — M545 Low back pain, unspecified: Secondary | ICD-10-CM | POA: Insufficient documentation

## 2011-11-07 DIAGNOSIS — M25569 Pain in unspecified knee: Secondary | ICD-10-CM | POA: Insufficient documentation

## 2011-11-07 DIAGNOSIS — IMO0001 Reserved for inherently not codable concepts without codable children: Secondary | ICD-10-CM | POA: Insufficient documentation

## 2011-11-13 ENCOUNTER — Ambulatory Visit: Payer: 59 | Admitting: Physical Therapy

## 2011-11-15 ENCOUNTER — Ambulatory Visit: Payer: 59 | Admitting: Physical Therapy

## 2012-02-25 DIAGNOSIS — E042 Nontoxic multinodular goiter: Secondary | ICD-10-CM | POA: Insufficient documentation

## 2012-08-28 DIAGNOSIS — K4020 Bilateral inguinal hernia, without obstruction or gangrene, not specified as recurrent: Secondary | ICD-10-CM | POA: Insufficient documentation

## 2012-11-06 DIAGNOSIS — R972 Elevated prostate specific antigen [PSA]: Secondary | ICD-10-CM | POA: Insufficient documentation

## 2013-03-09 ENCOUNTER — Inpatient Hospital Stay (HOSPITAL_BASED_OUTPATIENT_CLINIC_OR_DEPARTMENT_OTHER)
Admission: EM | Admit: 2013-03-09 | Discharge: 2013-03-12 | DRG: 287 | Disposition: A | Discharge status: Home / Self Care | Payer: Medicare PPO | Attending: Internal Medicine | Admitting: Internal Medicine

## 2013-03-09 ENCOUNTER — Encounter (HOSPITAL_BASED_OUTPATIENT_CLINIC_OR_DEPARTMENT_OTHER): Payer: Self-pay | Admitting: Emergency Medicine

## 2013-03-09 ENCOUNTER — Emergency Department (HOSPITAL_BASED_OUTPATIENT_CLINIC_OR_DEPARTMENT_OTHER): Payer: Medicare PPO

## 2013-03-09 DIAGNOSIS — N400 Enlarged prostate without lower urinary tract symptoms: Secondary | ICD-10-CM | POA: Diagnosis present

## 2013-03-09 DIAGNOSIS — J449 Chronic obstructive pulmonary disease, unspecified: Secondary | ICD-10-CM | POA: Diagnosis present

## 2013-03-09 DIAGNOSIS — R0789 Other chest pain: Principal | ICD-10-CM | POA: Diagnosis present

## 2013-03-09 DIAGNOSIS — E876 Hypokalemia: Secondary | ICD-10-CM | POA: Diagnosis not present

## 2013-03-09 DIAGNOSIS — Z807 Family history of other malignant neoplasms of lymphoid, hematopoietic and related tissues: Secondary | ICD-10-CM

## 2013-03-09 DIAGNOSIS — Z8249 Family history of ischemic heart disease and other diseases of the circulatory system: Secondary | ICD-10-CM

## 2013-03-09 DIAGNOSIS — J4489 Other specified chronic obstructive pulmonary disease: Secondary | ICD-10-CM | POA: Diagnosis present

## 2013-03-09 DIAGNOSIS — R9431 Abnormal electrocardiogram [ECG] [EKG]: Secondary | ICD-10-CM | POA: Diagnosis present

## 2013-03-09 DIAGNOSIS — R079 Chest pain, unspecified: Secondary | ICD-10-CM | POA: Diagnosis present

## 2013-03-09 DIAGNOSIS — I10 Essential (primary) hypertension: Secondary | ICD-10-CM | POA: Diagnosis present

## 2013-03-09 DIAGNOSIS — N19 Unspecified kidney failure: Secondary | ICD-10-CM | POA: Diagnosis present

## 2013-03-09 DIAGNOSIS — Z8489 Family history of other specified conditions: Secondary | ICD-10-CM

## 2013-03-09 DIAGNOSIS — D573 Sickle-cell trait: Secondary | ICD-10-CM | POA: Diagnosis present

## 2013-03-09 DIAGNOSIS — Z79899 Other long term (current) drug therapy: Secondary | ICD-10-CM

## 2013-03-09 DIAGNOSIS — R0602 Shortness of breath: Secondary | ICD-10-CM

## 2013-03-09 DIAGNOSIS — G4730 Sleep apnea, unspecified: Secondary | ICD-10-CM | POA: Diagnosis present

## 2013-03-09 HISTORY — DX: Sickle-cell trait: D57.3

## 2013-03-09 HISTORY — DX: Benign prostatic hyperplasia without lower urinary tract symptoms: N40.0

## 2013-03-09 HISTORY — DX: Essential (primary) hypertension: I10

## 2013-03-09 HISTORY — DX: Sleep apnea, unspecified: G47.30

## 2013-03-09 HISTORY — DX: Chest pain, unspecified: R07.9

## 2013-03-09 HISTORY — DX: Chronic obstructive pulmonary disease, unspecified: J44.9

## 2013-03-09 LAB — CBC WITH DIFFERENTIAL/PLATELET
Basophils Absolute: 0 10*3/uL (ref 0.0–0.1)
Basophils Relative: 0 % (ref 0–1)
Eosinophils Absolute: 0.5 10*3/uL (ref 0.0–0.7)
Eosinophils Relative: 6 % — ABNORMAL HIGH (ref 0–5)
HCT: 43.3 % (ref 39.0–52.0)
Hemoglobin: 14.9 g/dL (ref 13.0–17.0)
Lymphocytes Relative: 33 % (ref 12–46)
Lymphs Abs: 2.7 10*3/uL (ref 0.7–4.0)
MCH: 29 pg (ref 26.0–34.0)
MCHC: 34.4 g/dL (ref 30.0–36.0)
MCV: 84.2 fL (ref 78.0–100.0)
Monocytes Absolute: 0.8 10*3/uL (ref 0.1–1.0)
Monocytes Relative: 10 % (ref 3–12)
Neutro Abs: 4.3 10*3/uL (ref 1.7–7.7)
Neutrophils Relative %: 52 % (ref 43–77)
Platelets: 227 10*3/uL (ref 150–400)
RBC: 5.14 MIL/uL (ref 4.22–5.81)
RDW: 12.8 % (ref 11.5–15.5)
WBC: 8.3 10*3/uL (ref 4.0–10.5)

## 2013-03-09 LAB — COMPREHENSIVE METABOLIC PANEL
ALT: 33 U/L (ref 0–53)
AST: 44 U/L — ABNORMAL HIGH (ref 0–37)
Albumin: 4.3 g/dL (ref 3.5–5.2)
Alkaline Phosphatase: 80 U/L (ref 39–117)
BUN: 13 mg/dL (ref 6–23)
CO2: 32 mEq/L (ref 19–32)
Calcium: 9.8 mg/dL (ref 8.4–10.5)
Chloride: 102 mEq/L (ref 96–112)
Creatinine, Ser: 1.4 mg/dL — ABNORMAL HIGH (ref 0.50–1.35)
GFR calc Af Amer: 65 mL/min — ABNORMAL LOW (ref >90)
GFR calc non Af Amer: 56 mL/min — ABNORMAL LOW (ref >90)
Glucose, Bld: 100 mg/dL — ABNORMAL HIGH (ref 70–99)
Potassium: 3.5 mEq/L (ref 3.5–5.1)
Sodium: 143 mEq/L (ref 135–145)
Total Bilirubin: 0.5 mg/dL (ref 0.3–1.2)
Total Protein: 8.5 g/dL — ABNORMAL HIGH (ref 6.0–8.3)

## 2013-03-09 LAB — TROPONIN I: Troponin I: <0.3 ng/mL (ref <0.30)

## 2013-03-09 MED ORDER — MORPHINE SULFATE 4 MG/ML IJ SOLN
4.0000 mg | Freq: Once | INTRAMUSCULAR | Status: AC
  Administered 2013-03-10: 4 mg via INTRAVENOUS
  Filled 2013-03-09: qty 1

## 2013-03-09 MED ORDER — NITROGLYCERIN 2 % TD OINT
1.0000 [in_us] | TOPICAL_OINTMENT | Freq: Once | TRANSDERMAL | Status: AC
  Administered 2013-03-09: 1 [in_us] via TOPICAL
  Filled 2013-03-09: qty 1

## 2013-03-09 MED ORDER — ASPIRIN 81 MG PO CHEW
324.0000 mg | CHEWABLE_TABLET | Freq: Once | ORAL | Status: AC
  Administered 2013-03-09: 324 mg via ORAL
  Filled 2013-03-09: qty 4

## 2013-03-09 MED ORDER — ACETAMINOPHEN 325 MG PO TABS
650.0000 mg | ORAL_TABLET | Freq: Once | ORAL | Status: AC
  Administered 2013-03-09: 650 mg via ORAL

## 2013-03-09 MED ORDER — ACETAMINOPHEN 325 MG PO TABS
ORAL_TABLET | ORAL | Status: AC
  Filled 2013-03-09: qty 2

## 2013-03-09 NOTE — ED Provider Notes (Signed)
CSN: [id number]     Arrival date & time 03/09/13  2200 History  This chart was scribed for Shon Baton, MD by Danella Maiers, ED Scribe. This patient was seen in room MH08/MH08 and the patient's care was started at 10:10 PM.   Chief Complaint  Patient presents with  . Chest Pain   The history is provided by the patient. No language interpreter was used.   HPI Comments: [name] is a 52 y.o. male with a h/o COPD who presents to the Emergency Department complaining of constant, dull, left-sided CP and sharp, stabbing pain to his left arm since last night. He reports prior episodes of similar CP. He denies that the CP worsens with exertion. He reports nausea today. He also reports SOB and states he thinks it is due to his COPD. He used his inhaler with no relief. He denies recent cough, fevers. He has a family h/o heart disease (mother). He has a history of hypertension.   Past Medical History  Diagnosis Date  . COPD (chronic obstructive pulmonary disease)   . Enlarged prostate   . Sleep apnea   . Hypertension    Past Surgical History  Procedure Laterality Date  . Tonsillectomy    . Cardiac catheterization     History reviewed. No pertinent family history. History  Substance Use Topics  . Smoking status: Never Smoker   . Smokeless tobacco: Not on file  . Alcohol Use: No    Review of Systems  Constitutional: Negative for fever.  Respiratory: Positive for shortness of breath. Negative for cough.   Cardiovascular: Positive for chest pain.  Gastrointestinal: Positive for nausea. Negative for abdominal pain.  Musculoskeletal: Negative for back pain.    Allergies  Review of patient's allergies indicates no known allergies.  Home Medications   Current Outpatient Rx  Name  Route  Sig  Dispense  Refill  . diltiazem (DILACOR XR) 240 MG 24 hr capsule   Oral   Take 240 mg by mouth daily.         . finasteride (PROPECIA) 1 MG tablet   Oral   Take 5 mg by mouth  daily.         . hydrochlorothiazide (HYDRODIURIL) 25 MG tablet   Oral   Take 25 mg by mouth daily.         Marland Kitchen ibuprofen (ADVIL,MOTRIN) 800 MG tablet   Oral   Take 800 mg by mouth every 8 (eight) hours as needed.         Marland Kitchen omeprazole (PRILOSEC) 20 MG capsule   Oral   Take 40 mg by mouth daily.          . tamsulosin (FLOMAX) 0.4 MG CAPS capsule   Oral   Take 0.4 mg by mouth.         . triamterene-hydrochlorothiazide (DYAZIDE) 37.5-25 MG per capsule   Oral   Take 1 capsule by mouth daily.          BP 135/88  Pulse 80  Temp(Src) 98.1 F (36.7 C) (Oral)  Resp 20  Ht 6' (1.829 m)  Wt 192 lb (87.091 kg)  BMI 26.03 kg/m2  SpO2 99% Physical Exam  Nursing note and vitals reviewed. Constitutional: He is oriented to person, place, and time. He appears well-developed and well-nourished. No distress.  HENT:  Head: Normocephalic and atraumatic.  Neck: No JVD present.  Cardiovascular: Normal rate, regular rhythm and normal heart sounds.   No murmur heard. Pulmonary/Chest: Effort normal  and breath sounds normal. No respiratory distress. He has no wheezes. He exhibits no tenderness.  Abdominal: Soft. Bowel sounds are normal. There is no tenderness. There is no rebound.  Musculoskeletal: He exhibits no edema.  Lymphadenopathy:    He has no cervical adenopathy.  Neurological: He is alert and oriented to person, place, and time.  Skin: Skin is warm and dry.  Psychiatric: He has a normal mood and affect.    ED Course  Procedures (including critical care time) Medications  aspirin chewable tablet 324 mg (324 mg Oral Given 03/09/13 2235)  nitroGLYCERIN (NITROGLYN) 2 % ointment 1 inch (1 inch Topical Given 03/09/13 2233)  acetaminophen (TYLENOL) tablet 650 mg (650 mg Oral Given 03/09/13 2242)  morphine 4 MG/ML injection 4 mg (4 mg Intravenous Given 03/10/13 0000)    DIAGNOSTIC STUDIES: Oxygen Saturation is 99% on RA, normal by my interpretation.    COORDINATION OF  CARE: 10:27 PM- Discussed treatment plan with pt which includes troponin test, blood work, repeat EKG. Pt agrees to plan.    Labs Review Labs Reviewed  CBC WITH DIFFERENTIAL - Abnormal; Notable for the following:    Eosinophils Relative 6 (*)    All other components within normal limits  COMPREHENSIVE METABOLIC PANEL - Abnormal; Notable for the following:    Glucose, Bld 100 (*)    Creatinine, Ser 1.40 (*)    Total Protein 8.5 (*)    AST 44 (*)    GFR calc non Af Amer 56 (*)    GFR calc Af Amer 65 (*)    All other components within normal limits  TROPONIN I  D-DIMER, QUANTITATIVE   Imaging Review Dg Chest 2 View  03/09/2013   CLINICAL DATA:  Left chest pain, shortness of breath.  EXAM: CHEST  2 VIEW  COMPARISON:  05/09/2009  FINDINGS: The heart size and mediastinal contours are within normal limits. Both lungs are clear. No pleural effusion or pneumothorax. Multilevel degenerative changes and flowing anterior osteophytes.  IMPRESSION: No active cardiopulmonary disease.   Electronically Signed   By: Jearld Lesch M.D.   On: 03/09/2013 23:27    EKG Interpretation    Date/Time:  Tuesday March 09 2013 22:07:02 EST Ventricular Rate:  69 PR Interval:  204 QRS Duration: 126 QT Interval:  400 QTC Calculation: 428 R Axis:   -76 Text Interpretation:  Normal sinus rhythm Possible Left atrial enlargement Left axis deviation Left ventricular hypertrophy with QRS widening Nonspecific T wave abnormality Abnormal ECG <58mm ST elevation in leads I and AVL, ST elevation in AVL unchanged from prior Confirmed by Brenley Priore  MD, Marykathleen Russi ([id number]) on 03/09/2013 10:31:16 PM           Medications  aspirin chewable tablet 324 mg (324 mg Oral Given 03/09/13 2235)  nitroGLYCERIN (NITROGLYN) 2 % ointment 1 inch (1 inch Topical Given 03/09/13 2233)  acetaminophen (TYLENOL) tablet 650 mg (650 mg Oral Given 03/09/13 2242)  morphine 4 MG/ML injection 4 mg (4 mg Intravenous Given 03/10/13 0000)    MDM    1. Chest pain   2. SOB (shortness of breath)    This is a 52 year old male who presents with chest pain and shortness of breath. He is nontoxic-appearing on exam.  Vital signs are within normal limits. He has a normal cardiac and pulmonary exam. Patient hasfactors including hypertension for coronary artery disease. His story is somewhat atypical for ACS. EKG initially showed less than 1 mm ST elevation in leads 1 and aVL. When compared  to prior, patient has had prior elevations in aVL but none in one. I repeated an EKG 20 minutes following patient's arrival. There are no dynamic changes. I discussed with the cardiologist on call (Sivak) who agrees that the patient does not meet STEMI criteria at this time.  Initial troponin is negative. Patient was given nitroglycerin with improvement of his chest pain from 8 to 4.  Patient was subsequently given morphine with further improvement of his pain. D-dimer is negative and patient is low risk for PE. Given patient's risk factors and abnormal EKG, I feel the patient should be admitted for rule out.  I have reviewed the patient's chart and he had a cardiac catheterization in 2004 that showed clean coronaries at that time.  I discussed the patient with Dr. Toniann Fail who has agreed to receive the patient in transfer.  I personally performed the services described in this documentation, which was scribed in my presence. The recorded information has been reviewed and is accurate.    Shon Baton, MD 03/10/13 [phone]

## 2013-03-09 NOTE — ED Notes (Signed)
Call placed to [facility] to obtain med list per pt's request.

## 2013-03-09 NOTE — ED Notes (Signed)
Pt c/o dull ache chest pain which radiates down left arm also c/o SOB

## 2013-03-10 ENCOUNTER — Encounter (HOSPITAL_COMMUNITY): Payer: Self-pay | Admitting: Internal Medicine

## 2013-03-10 ENCOUNTER — Observation Stay (HOSPITAL_COMMUNITY): Payer: Medicare PPO

## 2013-03-10 DIAGNOSIS — I369 Nonrheumatic tricuspid valve disorder, unspecified: Secondary | ICD-10-CM

## 2013-03-10 DIAGNOSIS — I200 Unstable angina: Secondary | ICD-10-CM

## 2013-03-10 DIAGNOSIS — R079 Chest pain, unspecified: Secondary | ICD-10-CM | POA: Diagnosis present

## 2013-03-10 DIAGNOSIS — I10 Essential (primary) hypertension: Secondary | ICD-10-CM

## 2013-03-10 DIAGNOSIS — R0602 Shortness of breath: Secondary | ICD-10-CM

## 2013-03-10 DIAGNOSIS — R9431 Abnormal electrocardiogram [ECG] [EKG]: Secondary | ICD-10-CM | POA: Diagnosis present

## 2013-03-10 LAB — CBC WITH DIFFERENTIAL/PLATELET
Basophils Absolute: 0 10*3/uL (ref 0.0–0.1)
Basophils Relative: 1 % (ref 0–1)
Eosinophils Absolute: 0.4 10*3/uL (ref 0.0–0.7)
Eosinophils Relative: 6 % — ABNORMAL HIGH (ref 0–5)
HCT: 40.1 % (ref 39.0–52.0)
Hemoglobin: 13.9 g/dL (ref 13.0–17.0)
Lymphocytes Relative: 32 % (ref 12–46)
Lymphs Abs: 2.1 10*3/uL (ref 0.7–4.0)
MCH: 29.1 pg (ref 26.0–34.0)
MCHC: 34.7 g/dL (ref 30.0–36.0)
MCV: 84.1 fL (ref 78.0–100.0)
Monocytes Absolute: 0.7 10*3/uL (ref 0.1–1.0)
Monocytes Relative: 11 % (ref 3–12)
Neutro Abs: 3.4 10*3/uL (ref 1.7–7.7)
Neutrophils Relative %: 51 % (ref 43–77)
Platelets: 217 10*3/uL (ref 150–400)
RBC: 4.77 MIL/uL (ref 4.22–5.81)
RDW: 12.6 % (ref 11.5–15.5)
WBC: 6.6 10*3/uL (ref 4.0–10.5)

## 2013-03-10 LAB — COMPREHENSIVE METABOLIC PANEL
ALT: 26 U/L (ref 0–53)
AST: 35 U/L (ref 0–37)
Albumin: 3.8 g/dL (ref 3.5–5.2)
Alkaline Phosphatase: 68 U/L (ref 39–117)
BUN: 12 mg/dL (ref 6–23)
CO2: 29 mEq/L (ref 19–32)
Calcium: 9.2 mg/dL (ref 8.4–10.5)
Chloride: 102 mEq/L (ref 96–112)
Creatinine, Ser: 1.33 mg/dL (ref 0.50–1.35)
GFR calc Af Amer: 69 mL/min — ABNORMAL LOW (ref >90)
GFR calc non Af Amer: 60 mL/min — ABNORMAL LOW (ref >90)
Glucose, Bld: 107 mg/dL — ABNORMAL HIGH (ref 70–99)
Potassium: 3.1 mEq/L — ABNORMAL LOW (ref 3.5–5.1)
Sodium: 141 mEq/L (ref 135–145)
Total Bilirubin: 0.7 mg/dL (ref 0.3–1.2)
Total Protein: 7.5 g/dL (ref 6.0–8.3)

## 2013-03-10 LAB — URINALYSIS, ROUTINE W REFLEX MICROSCOPIC
Bilirubin Urine: NEGATIVE
Glucose, UA: NEGATIVE mg/dL
Hgb urine dipstick: NEGATIVE
Ketones, ur: NEGATIVE mg/dL
Leukocytes, UA: NEGATIVE
Nitrite: NEGATIVE
Protein, ur: NEGATIVE mg/dL
Specific Gravity, Urine: 1.016 (ref 1.005–1.030)
Urobilinogen, UA: 1 mg/dL (ref 0.0–1.0)
pH: 7 (ref 5.0–8.0)

## 2013-03-10 LAB — D-DIMER, QUANTITATIVE (NOT AT ARMC): D-Dimer, Quant: <0.27 ug/mL-FEU (ref 0.00–0.48)

## 2013-03-10 LAB — RAPID URINE DRUG SCREEN, HOSP PERFORMED
Amphetamines: NOT DETECTED
Barbiturates: NOT DETECTED
Benzodiazepines: NOT DETECTED
Cocaine: NOT DETECTED
Opiates: POSITIVE — AB
Tetrahydrocannabinol: NOT DETECTED

## 2013-03-10 LAB — TROPONIN I
Troponin I: <0.3 ng/mL (ref <0.30)
Troponin I: <0.3 ng/mL (ref <0.30)
Troponin I: <0.3 ng/mL (ref <0.30)

## 2013-03-10 LAB — MRSA PCR SCREENING: MRSA by PCR: NEGATIVE

## 2013-03-10 LAB — D-DIMER, QUANTITATIVE: D-Dimer, Quant: <0.27 ug/mL-FEU (ref 0.00–0.48)

## 2013-03-10 MED ORDER — MORPHINE SULFATE 2 MG/ML IJ SOLN: 1.0000 mg | INTRAMUSCULAR | Status: DC | PRN

## 2013-03-10 MED ORDER — FINASTERIDE 1 MG PO TABS
5.0000 mg | ORAL_TABLET | Freq: Every day | ORAL | Status: DC
  Administered 2013-03-10 – 2013-03-12 (×3): 5 mg via ORAL
  Filled 2013-03-10 (×3): qty 5

## 2013-03-10 MED ORDER — PANTOPRAZOLE SODIUM 40 MG PO TBEC
40.0000 mg | DELAYED_RELEASE_TABLET | Freq: Every day | ORAL | Status: DC
  Administered 2013-03-10 – 2013-03-12 (×3): 40 mg via ORAL
  Filled 2013-03-10 (×3): qty 1

## 2013-03-10 MED ORDER — SODIUM CHLORIDE 0.9 % IV SOLN
1.0000 mL/kg/h | INTRAVENOUS | Status: DC
  Administered 2013-03-11: 1 mL/kg/h via INTRAVENOUS

## 2013-03-10 MED ORDER — ENOXAPARIN SODIUM 40 MG/0.4ML ~~LOC~~ SOLN
40.0000 mg | SUBCUTANEOUS | Status: DC
  Administered 2013-03-10: 40 mg via SUBCUTANEOUS
  Filled 2013-03-10 (×3): qty 0.4

## 2013-03-10 MED ORDER — TRIAMTERENE-HCTZ 37.5-25 MG PO CAPS
1.0000 | ORAL_CAPSULE | Freq: Every day | ORAL | Status: DC
  Administered 2013-03-10 – 2013-03-12 (×3): 1 via ORAL
  Filled 2013-03-10 (×3): qty 1

## 2013-03-10 MED ORDER — TAMSULOSIN HCL 0.4 MG PO CAPS
0.4000 mg | ORAL_CAPSULE | Freq: Every day | ORAL | Status: DC
Start: 2013-03-10 — End: 2013-03-12
  Administered 2013-03-10 – 2013-03-11 (×2): 0.4 mg via ORAL
  Filled 2013-03-10 (×4): qty 1

## 2013-03-10 MED ORDER — GADOBENATE DIMEGLUMINE 529 MG/ML IV SOLN
38.0000 mL | Freq: Once | INTRAVENOUS | Status: AC | PRN
  Administered 2013-03-10: 38 mL via INTRAVENOUS

## 2013-03-10 MED ORDER — ACETAMINOPHEN 325 MG PO TABS
650.0000 mg | ORAL_TABLET | Freq: Four times a day (QID) | ORAL | Status: DC | PRN
  Administered 2013-03-10: 650 mg via ORAL
  Filled 2013-03-10: qty 2

## 2013-03-10 MED ORDER — SODIUM CHLORIDE 0.9 % IV SOLN: 250.0000 mL | INTRAVENOUS | Status: DC | PRN

## 2013-03-10 MED ORDER — ONDANSETRON HCL 4 MG/2ML IJ SOLN
4.0000 mg | Freq: Four times a day (QID) | INTRAMUSCULAR | Status: DC | PRN
  Administered 2013-03-10: 4 mg via INTRAVENOUS
  Filled 2013-03-10: qty 2

## 2013-03-10 MED ORDER — SODIUM CHLORIDE 0.9 % IJ SOLN
3.0000 mL | Freq: Two times a day (BID) | INTRAMUSCULAR | Status: DC
  Administered 2013-03-10 – 2013-03-11 (×3): 3 mL via INTRAVENOUS

## 2013-03-10 MED ORDER — DILTIAZEM HCL ER 240 MG PO CP24
240.0000 mg | ORAL_CAPSULE | Freq: Every day | ORAL | Status: DC
  Administered 2013-03-10 – 2013-03-12 (×3): 240 mg via ORAL
  Filled 2013-03-10 (×3): qty 1

## 2013-03-10 MED ORDER — ASPIRIN EC 325 MG PO TBEC
325.0000 mg | DELAYED_RELEASE_TABLET | Freq: Every day | ORAL | Status: DC
  Administered 2013-03-10 – 2013-03-12 (×3): 325 mg via ORAL
  Filled 2013-03-10 (×3): qty 1

## 2013-03-10 MED ORDER — NITROGLYCERIN 0.4 MG SL SUBL
0.4000 mg | SUBLINGUAL_TABLET | SUBLINGUAL | Status: DC | PRN
  Administered 2013-03-10 – 2013-03-12 (×4): 0.4 mg via SUBLINGUAL
  Filled 2013-03-10 (×4): qty 25

## 2013-03-10 MED ORDER — ACETAMINOPHEN 650 MG RE SUPP: 650.0000 mg | Freq: Four times a day (QID) | RECTAL | Status: DC | PRN

## 2013-03-10 MED ORDER — POTASSIUM CHLORIDE CRYS ER 20 MEQ PO TBCR
40.0000 meq | EXTENDED_RELEASE_TABLET | Freq: Once | ORAL | Status: AC
  Administered 2013-03-10: 40 meq via ORAL
  Filled 2013-03-10: qty 2

## 2013-03-10 MED ORDER — SODIUM CHLORIDE 0.9 % IV SOLN
INTRAVENOUS | Status: DC
  Administered 2013-03-10: 09:00:00 via INTRAVENOUS

## 2013-03-10 MED ORDER — ONDANSETRON HCL 4 MG PO TABS: 4.0000 mg | ORAL_TABLET | Freq: Four times a day (QID) | ORAL | Status: DC | PRN

## 2013-03-10 MED ORDER — SODIUM CHLORIDE 0.9 % IJ SOLN
3.0000 mL | Freq: Two times a day (BID) | INTRAMUSCULAR | Status: DC
  Administered 2013-03-10: 3 mL via INTRAVENOUS

## 2013-03-10 MED ORDER — SODIUM CHLORIDE 0.9 % IJ SOLN: 3.0000 mL | INTRAMUSCULAR | Status: DC | PRN

## 2013-03-10 MED ORDER — PANTOPRAZOLE SODIUM 40 MG PO TBEC: 40.0000 mg | DELAYED_RELEASE_TABLET | Freq: Every day | ORAL | Status: DC

## 2013-03-10 MED ORDER — SODIUM CHLORIDE 0.9 % IJ SOLN
3.0000 mL | Freq: Two times a day (BID) | INTRAMUSCULAR | Status: DC
  Administered 2013-03-10 – 2013-03-11 (×2): 3 mL via INTRAVENOUS

## 2013-03-10 MED FILL — Morphine Sulfate Inj PF 0.5 MG/ML: INTRAMUSCULAR | Qty: 2 | Status: AC

## 2013-03-10 NOTE — Progress Notes (Signed)
Pt with ST elevation/ST depression in various leads on 12-lead EKG. Dr. Toniann Fail in house and notified

## 2013-03-10 NOTE — Consult Note (Signed)
 CARDIOLOGY CONSULT NOTE   Patient ID: [name] MRN: [id number], DOB/AGE: 54/29/1962   Admit date: 03/09/2013 Date of Consult: 03/10/2013  Primary Physician: KATES, CHARITY, MD Primary Cardiologist: previously seen by S. McDowell, MD ([id number])  Pt. Profile  Patient is a 51-year-old male with PMH of HTN, angina, COPD, and family Hx of  CAD, MI. Presented to ED with chest pain.  Problem List  Past Medical History  Diagnosis Date  . COPD (chronic obstructive pulmonary disease)   . Enlarged prostate   . Sleep apnea   . Hypertension   . Chest pain     a. 11/2002 Cath: EF 68%, LM nl, LAD small, nl, LCX nl, RCA tortuous/nl;  b. 08/2011 St Echo: EF 60%, normal contractility, no scar/ischemia.  . Sickle cell trait     Past Surgical History  Procedure Laterality Date  . Tonsillectomy    . Cardiac catheterization      Allergies  No Known Allergies  HPI   51-year-old male with the above problem list. Has a history of angina for many years. The pain feels as though his heart is being squeezed and it is accompanied by left arm pain; the pain is frequent, not on exertion, and only lasts a few minutes. He used to take nitro SL; it caused ha. 2004 he experienced chest pain; ECG showed T-wave inversion. Cath was normal. 08/2011 Echo was normal. He last saw his primary care physician 3 months ago. Patient continues to have chest pain intermittently; denies diaphoresis, dizziness, and SOB. Lightheadedness accompanies chest pain when  running at the gym; he just works through it. 03/07/13 patient was lying in bed and started to have chest pain, left arm pain, SOB, nausea, and lightheadedness. The chest pain was 8/10; it was the same feeling as his heart was being squeezed. He did not take anything for the pain; nothing made it better or worse. He attributed the SOB to his COPD; albuterol did not alleviate SOB. For the next 2 days he continued to go to work; the pain was continuous but would change  in intensity from 8/10 - 4/10. Last night, 03/10/13 he decided to go to the ED since the pain did not go away. He was given asa, nitroglycerin patch, tylenol; once given morphine he was 0/10. SOB, nausea, and lightheadedness went away. ECG showed lateral ST elevations; he had same findings on ECG 02/2010. Troponin (-) x 2. CXR normal. Was admitted.   7:00 am this morning the chest and arm pain returned 6/10; he denies SOB, nausea, diaphoresis, lightheadedness.   Inpatient Medications  . aspirin EC  325 mg Oral Daily  . diltiazem  240 mg Oral Daily  . enoxaparin (LOVENOX) injection  40 mg Subcutaneous Q24H  . finasteride  5 mg Oral Daily  . pantoprazole  40 mg Oral Daily  . sodium chloride  3 mL Intravenous Q12H  . sodium chloride  3 mL Intravenous Q12H  . tamsulosin  0.4 mg Oral QPC supper  . triamterene-hydrochlorothiazide  1 capsule Oral Daily    Family History Family History  Problem Relation Age of Onset  . CAD Mother     died in her 70's - MI  . Multiple myeloma Brother   . Sickle cell anemia Father     died @ 36     Social History History   Social History  . Marital Status: Married    Spouse Name: N/A    Number of Children: N/A  . Years   of Education: N/A   Occupational History  . Not on file.   Social History Main Topics  . Smoking status: Never Smoker   . Smokeless tobacco: Not on file  . Alcohol Use: No  . Drug Use: No  . Sexual Activity: No   Other Topics Concern  . Not on file   Social History Narrative   Lives in [place] with dtr.     Review of Systems  General:  No chills, fever, night sweats or weight changes.  Cardiovascular:  (+) chest pain; no dyspnea on exertion, edema, orthopnea, palpitations, paroxysmal nocturnal dyspnea. Dermatological: No rash, lesions/masses Respiratory: No cough, dyspnea Urologic: No hematuria, dysuria.  +++ difficulty starting stream/h/o bph. Abdominal:   (+) nausea; no vomiting, diarrhea, bright red blood per rectum,  melena, or hematemesis Neurologic:  No visual changes, wkns, changes in mental status. All other systems reviewed and are otherwise negative except as noted above.  Physical Exam  Blood pressure 112/77, pulse 58, temperature 97.7 F (36.5 C), temperature source Oral, resp. rate 11, height 6' (1.829 m), weight 192 lb (87.091 kg), SpO2 97.00%.  General: Pleasant, NAD Psych: Normal affect. Neuro: Alert and oriented X 3. Moves all extremities spontaneously. HEENT: Normal  Neck: Supple without bruits or JVD. Lungs:  Resp regular and unlabored, CTA. Heart: RRR no s3, s4, or murmurs. Abdomen: Soft, non-tender, non-distended, BS + x 4.  Extremities: No clubbing, cyanosis or edema. DP/PT/Radials 2+ and equal bilaterally.  Labs   Recent Labs  03/09/13 2210 03/10/13 0352 03/10/13 0940  TROPONINI <0.30 <0.30 <0.30   Lab Results  Component Value Date   WBC 6.6 03/10/2013   HGB 13.9 03/10/2013   HCT 40.1 03/10/2013   MCV 84.1 03/10/2013   PLT 217 03/10/2013     Recent Labs Lab 03/10/13 0940  NA 141  K 3.1*  CL 102  CO2 29  BUN 12  CREATININE 1.33  CALCIUM 9.2  PROT 7.5  BILITOT 0.7  ALKPHOS 68  ALT 26  AST 35  GLUCOSE 107*   Lab Results  Component Value Date   DDIMER <0.27 03/09/2013    Radiology/Studies  Dg Chest 2 View  03/09/2013   CLINICAL DATA:  Left chest pain, shortness of breath.  EXAM: CHEST  2 VIEW  COMPARISON:  05/09/2009  FINDINGS: The heart size and mediastinal contours are within normal limits. Both lungs are clear. No pleural effusion or pneumothorax. Multilevel degenerative changes and flowing anterior osteophytes.  IMPRESSION: No active cardiopulmonary disease.   Electronically Signed   By: Andrew  DelGaizo M.D.   On: 03/09/2013 23:27   ECG  Sinus bradycardia 59bpm, ~1mm ST elevation in I, aVL, st dep and twi in III - similar to old ecg's.  ASSESSMENT AND PLAN  1. Unstable Angina- Patient has a Hx of angina with left arm pain that has been occuring  for years. These occurences are frequent but last only a few minutes and tend to go away when he rests. Patient sometimes experiences lightheadedness with the chest pain when running. His current episode lasted for 3 days and was accompanied by SOB, nausea,and lightheadedness which was resolved when given morphine. Troponin (-) x 2, ECG showed ST elevations but was the same as 2011 (stress test was normal). CXR normal. Echo was ordered; waiting for results. -catheretization to rule out infarct cause of ST elevations and angina. Last cath 2004. -Cardiac MRI  2. Hypokalemia- potassium 3.5. -Give K-Dur  Corilin Meggitt, [employer] NP Student  Addendum    Patient seen and examined with Ms. Meggitt and prior records extensively reviewed. He is a 51-year-old male with prior history of chest pain and abnormal ECG dating back quite some time. He had a normal cardiac catheterization 2004 and subsequently a normal stress echocardiogram in 2013. He has lateral ST segment elevation dating back to ECGs in 2011 in the computer. He was in his usual state of health until this past Sunday evening when he developed left shoulder pain and later left chest discomfort. This was constant from Sunday evening until yesterday morning. Over the same period of time, he has been experiencing more dyspnea and abdominal discomfort. Despite having this discomfort, he has been working in symptoms are not worsened with exertion. Yesterday, symptoms were more intermittent in nature, with severe left-sided chest squeezing coming on lasting about 5 or 10 seconds and then resolving before returning within about 30 minutes. He called his primary care provider yesterday afternoon and was advised to present to the ED for evaluation. Here, ECG shows lateral ST segment elevation with inferior ST depression, similar to prior ECGs. Though he was pain-free overnight, he had recurrent chest pain this morning which is been more persistent. ECG was slightly  more pronounced lateral ST elevation of troponins have been normal. Chest pain denies any worse with palpation, deep breathing, or position changes. Vital signs are currently stable.  See above for FH/SH/ROS (all obtained by me)  Exam: Pleasant African American male no acute distress, awake and oriented x3, normal affect. Neuro is grossly intact and nonfocal. Skin is warm and dry without lesions or masses. Neck is supple without bruits or JVD. Lungs respirations regular and unlabored, clear to auscultation. Cardiac regular S1-S2, no S3, S4, or murmurs. Abdomen is round soft, nontender, nondistended, bowel sounds present x4. Extremities are warm and dry without clubbing, cyanosis, or edema. Distal pulses are 2+ and equal bilaterally.  Assessment and plan: 1. Unstable angina/abnormal ECG: Patient presents with a 2 to three-day history of persistent chest discomfort and abnormal ECG. ECG abnormalities date back some time. Despite prolonged symptoms and Sunday evening, his troponins are normal. He had a normal catheterization 2004 and a negative stress echocardiogram in 2013. We will plan to evaluate cardiac MRI today to assess for possible scar and/or infiltrative disease which could potentially account for his ECG changes. Because of ongoing chest discomfort, we'll also arrange for diagnostic cardiac catheterization tomorrow to rule out obstructive CAD.  Continue aspirin therapy. Regardless of outcome of testing, patient should be provided with a copy of his ECG to carry with him at all times.  2. Hypertension: Stable.  3. Hypokalemia: Supplement.  Signed, Christopher Berge, NP 03/10/2013, 11:11 AM  Patient examined chart reviewed. Chronically abnormal ECG with lateral ST elevation especially in I and AVL with inferior T wave inversions. No rub on exam.  Will arrange MRI today to r/o infiltrative process that may contribute to chronically abnormal ECG , aneurysm, sarcoid or other Given persistant  nature of pain and some relief with nitro will cath in a.m. Agree with prolonged pain and negative troponin urgent cath not indicated  Shahed Yeoman   

## 2013-03-10 NOTE — Progress Notes (Signed)
[facility] Progress Note  TEAM 1 - Stepdown ICU Team   [name] NWG:[id number] DOB: 09-Apr-1960 DOA: 03/09/2013 PCP: Tomma Lightning, MD  Brief narrative: 52 year old male patient with history of hypertension and COPD. Presented to medicine are hypoactive complaints of chest pain for 2 days. The pain is located on the left side anteriorly nonradiating and is pressure-like lasting a few minutes with recurrence. Patient has chronic shortness of breath and this is not associated with chest pain. No other symptoms. Initial EKG showed ST segment elevation in leads 1 aVL. On-call cardiologist was consulted reviewed the EKG and felt these changes were not significant. They were similar to previous EKGs. Initial cardiac markers were negative. The patient's chest pain only improved after morphine and has been recurrent since that time. Of note patient had a normal cardiac catheterization in 2004. Echocardiogram in 2005 revealed normal systolic function without any regional wall motion abnormalities.  Assessment/Plan: Chest pain  -Cardiac enzymes have remained negative despite ongoing chest pain  -EKG done this morning during chest pain demonstrated apparent new T-wave inversion in leads III, aVF, and V1; subsequent review of previous admission EKGs demonstrated similar findings. Because of these abnormal findings cardiology recommended patient be provided a copy of his EKG time of discharge to carry with him at all times.  -Continue aspirin; if diagnostic workup consistent with cardiac ischemia consider changing diltiazem to beta blocker -Cardiology was consulted and an ordered a cardiac MRI to assess for possible scar and/or infiltrative disease which could account for the EKG changes. In addition because of ongoing chest discomfort a diagnostic cardiac catheterization is planned for 03/11/2013 to rule out obstructive CAD.  -2-D echocardiogram was completed that demonstrated preserved  LV function without regional wall motion abnormalities only mild stable LVH   Hypertension  -Low pressure has remained stable throughout the hospitalization no changes made to the medications at this time   COPD  -stable without decompensation   DVT prophylaxis: Lovenox Code Status: Full Family Communication: Patient Disposition Plan/Expected LOS: Remain in step down. Anticipate discharge to home when medically stable Isolation: None Nutritional Status: Stable  Consultants: Cardiology   Procedures: 2-D echocardiogram  Left ventricle: The cavity size was normal. Wall thickness was increased in a pattern of mild LVH. Systolic function was normal. The estimated ejection fraction was in the range of 55% to 60%. Wall motion was normal; there were no regional wall motion abnormalities.   Left heart catheterization   pending   Antibiotics: None  HPI/Subjective: Patient continues to complain of atypical chest discomfort mainly in the mid to left anterior chest a few seconds in duration and occurring at rest. No associated symptoms such as diaphoresis, shortness of breath or nausea. Is not worsened with movement of upper body or upper extremities. Anterior chest wall nontender to palpation.  Objective: Blood pressure 145/116, pulse 90, temperature 98.3 F (36.8 C), temperature source Oral, resp. rate 19, height 6' (1.829 m), weight 192 lb (87.091 kg), SpO2 100.00%.  Intake/Output Summary (Last 24 hours) at 03/10/13 1348 Last data filed at 03/10/13 1300  Gross per 24 hour  Intake    580 ml  Output    300 ml  Net    280 ml     Exam: Follow up exam completed. Patient admitted at 3:24 AM today   Scheduled Meds:  Scheduled Meds: . aspirin EC  325 mg Oral Daily  . diltiazem  240 mg Oral Daily  . enoxaparin (LOVENOX) injection  40 mg  Subcutaneous Q24H  . finasteride  5 mg Oral Daily  . pantoprazole  40 mg Oral Daily  . sodium chloride  3 mL Intravenous Q12H  . sodium  chloride  3 mL Intravenous Q12H  . sodium chloride  3 mL Intravenous Q12H  . tamsulosin  0.4 mg Oral QPC supper  . triamterene-hydrochlorothiazide  1 capsule Oral Daily   Continuous Infusions: . sodium chloride 20 mL/hr at 03/10/13 0845  . [START ON 03/11/2013] sodium chloride      **Reviewed in detail by the Attending Physician  Data Reviewed: Basic Metabolic Panel:  Recent Labs Lab 03/09/13 2210 03/10/13 0940  NA 143 141  K 3.5 3.1*  CL 102 102  CO2 32 29  GLUCOSE 100* 107*  BUN 13 12  CREATININE 1.40* 1.33  CALCIUM 9.8 9.2   Liver Function Tests:  Recent Labs Lab 03/09/13 2210 03/10/13 0940  AST 44* 35  ALT 33 26  ALKPHOS 80 68  BILITOT 0.5 0.7  PROT 8.5* 7.5  ALBUMIN 4.3 3.8   No results found for this basename: LIPASE, AMYLASE,  in the last 168 hours No results found for this basename: AMMONIA,  in the last 168 hours CBC:  Recent Labs Lab 03/09/13 2210 03/10/13 0940  WBC 8.3 6.6  NEUTROABS 4.3 3.4  HGB 14.9 13.9  HCT 43.3 40.1  MCV 84.2 84.1  PLT 227 217   Cardiac Enzymes:  Recent Labs Lab 03/09/13 2210 03/10/13 0352 03/10/13 0940  TROPONINI <0.30 <0.30 <0.30   BNP (last 3 results) No results found for this basename: PROBNP,  in the last 8760 hours CBG: No results found for this basename: GLUCAP,  in the last 168 hours  Recent Results (from the past 240 hour(s))  MRSA PCR SCREENING     Status: None   Collection Time    03/10/13  3:00 AM      Result Value Range Status   MRSA by PCR NEGATIVE  NEGATIVE Final   Comment:            The GeneXpert MRSA Assay (FDA     approved for NASAL specimens     only), is one component of a     comprehensive MRSA colonization     surveillance program. It is not     intended to diagnose MRSA     infection nor to guide or     monitor treatment for     MRSA infections.     Studies:  Recent x-ray studies have been reviewed in detail by the Attending Physician     Junious Silk, ANP [facility] Office  [phone] Pager [phone]  **If unable to reach the above provider after paging please contact the Flow Manager @ [phone]  On-Call/Text Page:      [email or web address]      password [email or web address]  If 7PM-7AM, please contact night-coverage [email or web address] Password [email or web address] 03/10/2013, 1:48 PM   LOS: 1 day   I have examined the patient, reviewed the chart and modified the above note which I agree with.   Velda Wendt,MD [phone] 03/10/2013, 5:15 PM

## 2013-03-10 NOTE — Progress Notes (Signed)
Echo Lab  2D Echocardiogram completed.  Meryl Ponder L Moni Rothrock, RDCS 03/10/2013 9:29 AM

## 2013-03-10 NOTE — H&P (Addendum)
[facility] History and Physical  [name]:[id number] DOB: February 20, 1961 DOA: 03/09/2013  Referring physician: ER physician. PCP: Bobby Lightning, MD   Chief Complaint: Chest pain.  HPI: [name] is a 52 y.o. male with history of hypertension, BPH and COPD presented to the ER at the [facility] with complaints of chest pain. Patient has been experiencing chest pain since 2 days. Pain is left sided anterior chest wall nonradiating pressure-like lasts a few minutes and recurs. Patient states that he also has shortness of breath which is not new. Denies any nausea vomiting diaphoresis palpitations or abdominal pain. In the ER patient's EKG was showing ST elevation in leads  one and aVL. On-call cardiologist was consulted and as per the cardiologist they were not significant. Patient's cardiac markers were negative. Patient's chest pain only improved after morphine and has been recurring. Patient has been admitted for further management. Patient states that he has had cardiac catheterization in 2004.  Review of Systems: As presented in the history of presenting illness, rest negative.  Past Medical History  Diagnosis Date  . COPD (chronic obstructive pulmonary disease)   . Enlarged prostate   . Sleep apnea   . Hypertension    Past Surgical History  Procedure Laterality Date  . Tonsillectomy    . Cardiac catheterization     Social History:  reports that he has never smoked. He does not have any smokeless tobacco history on file. He reports that he does not drink alcohol or use illicit drugs. Where does patient live home. Can patient participate in ADLs? Yes.  No Known Allergies  Family History:  Family History  Problem Relation Age of Onset  . CAD Mother   . Multiple myeloma Brother       Prior to Admission medications   Medication Sig Start Date End Date Taking? Authorizing Provider  diltiazem (DILACOR XR) 240 MG 24 hr capsule Take 240 mg by mouth  daily.   Yes Historical Provider, MD  finasteride (PROPECIA) 1 MG tablet Take 5 mg by mouth daily.   Yes Historical Provider, MD  hydrochlorothiazide (HYDRODIURIL) 25 MG tablet Take 25 mg by mouth daily.   Yes Historical Provider, MD  ibuprofen (ADVIL,MOTRIN) 800 MG tablet Take 800 mg by mouth every 8 (eight) hours as needed.   Yes Historical Provider, MD  omeprazole (PRILOSEC) 20 MG capsule Take 40 mg by mouth daily.    Yes Historical Provider, MD  tamsulosin (FLOMAX) 0.4 MG CAPS capsule Take 0.4 mg by mouth.   Yes Historical Provider, MD  triamterene-hydrochlorothiazide (DYAZIDE) 37.5-25 MG per capsule Take 1 capsule by mouth daily.   Yes Historical Provider, MD    Physical Exam: Filed Vitals:   03/09/13 2212 03/10/13 0039 03/10/13 0039 03/10/13 0105  BP: 135/88  141/84 124/90  Pulse: 80 63  62  Temp: 98.1 F (36.7 C)     TempSrc: Oral     Resp: 20   13  Height: 6' (1.829 m)     Weight: 87.091 kg (192 lb)     SpO2: 99%   100%     General:  Well-developed well-nourished.  Eyes: Anicteric no pallor.  ENT: No discharge from ears eyes nose mouth.  Neck:  No mass felt no. No JVD appreciated.  Cardiovascular: S1-S2 heard.  Respiratory: No rhonchi or crepitations.  Abdomen: Soft nontender bowel sounds present.  Skin: No rash.  Musculoskeletal: No edema.  Psychiatric: Appears normal.  Neurologic: Alert awake oriented to time place  and person. Moves all extremities.  Labs on Admission:  Basic Metabolic Panel:  Recent Labs Lab 03/09/13 2210  NA 143  K 3.5  CL 102  CO2 32  GLUCOSE 100*  BUN 13  CREATININE 1.40*  CALCIUM 9.8   Liver Function Tests:  Recent Labs Lab 03/09/13 2210  AST 44*  ALT 33  ALKPHOS 80  BILITOT 0.5  PROT 8.5*  ALBUMIN 4.3   No results found for this basename: LIPASE, AMYLASE,  in the last 168 hours No results found for this basename: AMMONIA,  in the last 168 hours CBC:  Recent Labs Lab 03/09/13 2210  WBC 8.3  NEUTROABS 4.3   HGB 14.9  HCT 43.3  MCV 84.2  PLT 227   Cardiac Enzymes:  Recent Labs Lab 03/09/13 2210  TROPONINI <0.30    BNP (last 3 results) No results found for this basename: PROBNP,  in the last 8760 hours CBG: No results found for this basename: GLUCAP,  in the last 168 hours  Radiological Exams on Admission: Dg Chest 2 View  03/09/2013   CLINICAL DATA:  Left chest pain, shortness of breath.  EXAM: CHEST  2 VIEW  COMPARISON:  05/09/2009  FINDINGS: The heart size and mediastinal contours are within normal limits. Both lungs are clear. No pleural effusion or pneumothorax. Multilevel degenerative changes and flowing anterior osteophytes.  IMPRESSION: No active cardiopulmonary disease.   Electronically Signed   By: Jearld Lesch M.D.   On: 03/09/2013 23:27    EKG: Independently reviewed. Normal sinus rhythm with ST-T elevation in leads one and aVL.  Assessment/Plan Principal Problem:   Chest pain Active Problems:   HTN (hypertension)   1. Chest pain - as per the on-call cardiologist the EKG changes were not significant for ST elevation MI. Presently patient is chest pain-free. Cycle cardiac markers check 2-D echo check d-dimer. Aspirin. 2. Hypertension - continue home medications. 3. Renal failure, probably chronic - creatinine in 2011 was 1.5. Closely follow intake output and metabolic panel. Check UA. Discontinue NSAIDs. 4. History of COPD - presently not wheezing. 5. History of BPH.    Code Status: Full code.  Family Communication: None.  Disposition Plan: Admit for observation.    Tali Coster N. [facility] Pager [phone].  If 7PM-7AM, please contact night-coverage [email or web address] Password [email or web address] 03/10/2013, 3:25 AM

## 2013-03-10 NOTE — Progress Notes (Signed)
UR completed 

## 2013-03-10 NOTE — Discharge Summary (Deleted)
Physician Discharge Summary  [name] AVW:[id number] DOB: 1961-02-05 DOA: 03/09/2013  PCP: Tomma Lightning, MD  Admit date: 03/09/2013 Discharge date: 03/11/2013  Time spent: 30 minutes  Recommendations for Outpatient Follow-up:  1. Followup with your primary care physician  For non cardiac chest pain  Discharge Diagnoses:  Principal Problem:   Chest pain Active Problems:   HTN (hypertension)   Nonspecific abnormal electrocardiogram (ECG) (EKG)   Discharge Condition: stable  Diet recommendation: Heart Healthy  Filed Weights   03/09/13 2212  Weight: 192 lb (87.091 kg)    History of present illness:  52 year old male patient with history of hypertension and COPD. Presented to medicine are hypoactive complaints of chest pain for 2 days. The pain is located on the left side anteriorly nonradiating and is pressure-like lasting a few minutes with recurrence. Patient has chronic shortness of breath and this is not associated with chest pain. No other symptoms. Initial EKG showed ST segment elevation in leads 1 aVL. On-call cardiologist was consulted reviewed the EKG and felt these changes were not significant. They were similar to previous EKGs. Initial cardiac markers were negative. The patient's chest pain only improved after morphine and has been recurrent since that time. Of note patient had a normal cardiac catheterization in 2004. Echocardiogram in 2005 revealed normal systolic function without any regional wall motion abnormalities.  Hospital Course:  Chest pain -Cardiac enzymes remained negative despite ongoing chest pain -EKG done on morning of 12/03 during chest pain complaints demonstrated apparent new T-wave inversion in leads 3, aVF, and V1; subsequent review of previous admission EKGs demonstrated similar findings. Because of these abnormal findings cardiology recommended patient be provided a copy of his EKG time of discharge to carry with him at all times. -Cardiology  was consulted and an ordered a cardiac MRI to assess for possible scar and/or infiltrative disease which could account for the EKG changes. This revealed no scar or evidence of HOCM. In addition because of ongoing chest discomfort a diagnostic cardiac catheterization was completed on12/07/2012 and demonstrated normal coronaries -2-D echocardiogram was completed that demonstrated preserved LV function without regional wall motion abnormalities only mild stable LVH  Hypertension -Blood pressure has remained stable throughout the hospitalization so no changes made to home medications  COPD -stable without decompensation  Procedures: 2-D echocardiogram Left ventricle: The cavity size was normal. Wall thickness was increased in a pattern of mild LVH. Systolic function was normal. The estimated ejection fraction was in the range of 55% to 60%. Wall motion was normal; there were no regional wall motion abnormalities.  Left heart catheterization Normal coronaries  Consultations:  Cardiology  Discharge Exam: Filed Vitals:   03/11/13 0804  BP: 128/96  Pulse:   Temp:   Resp:    General: No acute respiratory distress Lungs: Clear to auscultation bilaterally without wheezes or crackles, RA Cardiovascular: Regular rate and rhythm without murmur gallop or rub normal S1 and S2, no peripheral edema or JVD Abdomen: Nontender, nondistended, soft, bowel sounds positive, no rebound, no ascites, no appreciable mass Musculoskeletal: No significant cyanosis, clubbing of bilateral lower extremities Neurological: Alert and oriented x 3, moves all extremities x 4 without focal neurological deficits, CN 2-12 intact    Discharge Instructions     Medication List         diltiazem 240 MG 24 hr capsule  Commonly known as:  DILACOR XR  Take 240 mg by mouth daily.     ibuprofen 800 MG tablet  Commonly known as:  ADVIL,MOTRIN  Take 800 mg by mouth every 8 (eight) hours as needed.     omeprazole  10 MG capsule  Commonly known as:  PRILOSEC  Take 40 mg by mouth daily.     tamsulosin 0.4 MG Caps capsule  Commonly known as:  FLOMAX  Take 0.4 mg by mouth daily.     tiotropium 18 MCG inhalation capsule  Commonly known as:  SPIRIVA  Place 18 mcg into inhaler and inhale daily.     triamterene-hydrochlorothiazide 37.5-25 MG per capsule  Commonly known as:  DYAZIDE  Take 1 capsule by mouth daily.       No Known Allergies Follow-up Information   Follow up with KATES, CHARITY, MD In 2 weeks. (Regarding your non cardiac chest pain)    Specialty:  Family Medicine   Contact information:   [address], [address] [facility] [place] [address] [phone]        The results of significant diagnostics from this hospitalization (including imaging, microbiology, ancillary and laboratory) are listed below for reference.    Significant Diagnostic Studies: Dg Chest 2 View  03/09/2013   CLINICAL DATA:  Left chest pain, shortness of breath.  EXAM: CHEST  2 VIEW  COMPARISON:  05/09/2009  FINDINGS: The heart size and mediastinal contours are within normal limits. Both lungs are clear. No pleural effusion or pneumothorax. Multilevel degenerative changes and flowing anterior osteophytes.  IMPRESSION: No active cardiopulmonary disease.   Electronically Signed   By: Jearld Lesch M.D.   On: 03/09/2013 23:27    Microbiology: Recent Results (from the past 240 hour(s))  MRSA PCR SCREENING     Status: None   Collection Time    03/10/13  3:00 AM      Result Value Range Status   MRSA by PCR NEGATIVE  NEGATIVE Final   Comment:            The GeneXpert MRSA Assay (FDA     approved for NASAL specimens     only), is one component of a     comprehensive MRSA colonization     surveillance program. It is not     intended to diagnose MRSA     infection nor to guide or     monitor treatment for     MRSA infections.     Labs: Basic Metabolic Panel:  Recent  Labs Lab 03/09/13 2210 03/10/13 0940 03/11/13 0352  NA 143 141 139  K 3.5 3.1* 3.8  CL 102 102 102  CO2 32 29 24  GLUCOSE 100* 107* 82  BUN 13 12 11   CREATININE 1.40* 1.33 1.27  CALCIUM 9.8 9.2 9.3   Liver Function Tests:  Recent Labs Lab 03/09/13 2210 03/10/13 0940  AST 44* 35  ALT 33 26  ALKPHOS 80 68  BILITOT 0.5 0.7  PROT 8.5* 7.5  ALBUMIN 4.3 3.8   No results found for this basename: LIPASE, AMYLASE,  in the last 168 hours No results found for this basename: AMMONIA,  in the last 168 hours CBC:  Recent Labs Lab 03/09/13 2210 03/10/13 0940  WBC 8.3 6.6  NEUTROABS 4.3 3.4  HGB 14.9 13.9  HCT 43.3 40.1  MCV 84.2 84.1  PLT 227 217   Cardiac Enzymes:  Recent Labs Lab 03/09/13 2210 03/10/13 0352 03/10/13 0940 03/10/13 1556  TROPONINI <0.30 <0.30 <0.30 <0.30   BNP: BNP (last 3 results) No results found for this basename: PROBNP,  in the last 8760 hours CBG:  No results found for this basename: GLUCAP,  in the last 168 hours     Signed:  ELLIS,ALLISON L. ANP [facility] 03/11/2013, 12:11 PM

## 2013-03-11 ENCOUNTER — Encounter (HOSPITAL_COMMUNITY)
Admission: EM | Disposition: A | Discharge status: Home / Self Care | Payer: Self-pay | Source: Home / Self Care | Attending: Internal Medicine

## 2013-03-11 DIAGNOSIS — R9431 Abnormal electrocardiogram [ECG] [EKG]: Secondary | ICD-10-CM

## 2013-03-11 DIAGNOSIS — I200 Unstable angina: Secondary | ICD-10-CM

## 2013-03-11 HISTORY — PX: LEFT HEART CATHETERIZATION WITH CORONARY ANGIOGRAM: SHX5451

## 2013-03-11 LAB — D-DIMER, QUANTITATIVE (NOT AT ARMC): D-Dimer, Quant: <0.27 ug/mL-FEU (ref 0.00–0.48)

## 2013-03-11 LAB — BASIC METABOLIC PANEL
BUN: 11 mg/dL (ref 6–23)
CO2: 24 mEq/L (ref 19–32)
Calcium: 9.3 mg/dL (ref 8.4–10.5)
Chloride: 102 mEq/L (ref 96–112)
Creatinine, Ser: 1.27 mg/dL (ref 0.50–1.35)
GFR calc Af Amer: 73 mL/min — ABNORMAL LOW (ref >90)
GFR calc non Af Amer: 63 mL/min — ABNORMAL LOW (ref >90)
Glucose, Bld: 82 mg/dL (ref 70–99)
Potassium: 3.8 mEq/L (ref 3.5–5.1)
Sodium: 139 mEq/L (ref 135–145)

## 2013-03-11 LAB — PROTIME-INR
INR: 0.93 (ref 0.00–1.49)
Prothrombin Time: 12.3 seconds (ref 11.6–15.2)

## 2013-03-11 SURGERY — LEFT HEART CATHETERIZATION WITH CORONARY ANGIOGRAM
Anesthesia: LOCAL

## 2013-03-11 MED ORDER — LIDOCAINE HCL (PF) 1 % IJ SOLN
INTRAMUSCULAR | Status: AC
  Filled 2013-03-11: qty 30

## 2013-03-11 MED ORDER — MIDAZOLAM HCL 2 MG/2ML IJ SOLN
INTRAMUSCULAR | Status: AC
  Filled 2013-03-11: qty 2

## 2013-03-11 MED ORDER — SODIUM CHLORIDE 0.9 % IV SOLN: INTRAVENOUS | Status: AC

## 2013-03-11 MED ORDER — INFLUENZA VAC SPLIT QUAD 0.5 ML IM SUSP
0.5000 mL | Freq: Once | INTRAMUSCULAR | Status: AC
  Administered 2013-03-12: 0.5 mL via INTRAMUSCULAR
  Filled 2013-03-11 (×2): qty 0.5

## 2013-03-11 MED ORDER — VERAPAMIL HCL 2.5 MG/ML IV SOLN
INTRAVENOUS | Status: AC
  Filled 2013-03-11: qty 2

## 2013-03-11 MED ORDER — ACETAMINOPHEN 325 MG PO TABS
650.0000 mg | ORAL_TABLET | ORAL | Status: DC | PRN
  Administered 2013-03-12 (×3): 650 mg via ORAL
  Filled 2013-03-11 (×3): qty 2

## 2013-03-11 MED ORDER — SODIUM CHLORIDE 0.9 % IV SOLN
INTRAVENOUS | Status: DC
  Administered 2013-03-11 – 2013-03-12 (×2): via INTRAVENOUS

## 2013-03-11 MED ORDER — FENTANYL CITRATE 0.05 MG/ML IJ SOLN
INTRAMUSCULAR | Status: AC
  Filled 2013-03-11: qty 2

## 2013-03-11 MED ORDER — HEPARIN (PORCINE) IN NACL 2-0.9 UNIT/ML-% IJ SOLN
INTRAMUSCULAR | Status: AC
  Filled 2013-03-11: qty 1000

## 2013-03-11 MED ORDER — HEPARIN SODIUM (PORCINE) 1000 UNIT/ML IJ SOLN
INTRAMUSCULAR | Status: AC
  Filled 2013-03-11: qty 1

## 2013-03-11 MED ORDER — OXYCODONE HCL 5 MG PO TABS
5.0000 mg | ORAL_TABLET | ORAL | Status: DC | PRN
  Administered 2013-03-11: 17:00:00 5 mg via ORAL
  Filled 2013-03-11: qty 1
  Filled 2013-03-11: qty 2

## 2013-03-11 MED ORDER — NITROGLYCERIN 0.2 MG/ML ON CALL CATH LAB
INTRAVENOUS | Status: AC
  Filled 2013-03-11: qty 1

## 2013-03-11 MED ORDER — ONDANSETRON HCL 4 MG/2ML IJ SOLN: 4.0000 mg | Freq: Four times a day (QID) | INTRAMUSCULAR | Status: DC | PRN

## 2013-03-11 NOTE — H&P (View-Only) (Signed)
CARDIOLOGY CONSULT NOTE   Patient ID: [name] MRN: [id number], DOB/AGE: 54-Oct-1962   Admit date: 03/09/2013 Date of Consult: 03/10/2013  Primary Physician: Tomma Lightning, MD Primary Cardiologist: previously seen by Ival Bible, MD (2004)  Pt. Profile  Patient is a 52 year old male with PMH of HTN, angina, COPD, and family Hx of  CAD, MI. Presented to ED with chest pain.  Problem List  Past Medical History  Diagnosis Date  . COPD (chronic obstructive pulmonary disease)   . Enlarged prostate   . Sleep apnea   . Hypertension   . Chest pain     a. 11/2002 Cath: EF 68%, LM nl, LAD small, nl, LCX nl, RCA tortuous/nl;  b. 08/2011 St Echo: EF 60%, normal contractility, no scar/ischemia.  . Sickle cell trait     Past Surgical History  Procedure Laterality Date  . Tonsillectomy    . Cardiac catheterization      Allergies  No Known Allergies  HPI   52 year old male with the above problem list. Has a history of angina for many years. The pain feels as though his heart is being squeezed and it is accompanied by left arm pain; the pain is frequent, not on exertion, and only lasts a few minutes. He used to take nitro SL; it caused ha. 2004 he experienced chest pain; ECG showed T-wave inversion. Cath was normal. 08/2011 Echo was normal. He last saw his primary care physician 3 months ago. Patient continues to have chest pain intermittently; denies diaphoresis, dizziness, and SOB. Lightheadedness accompanies chest pain when  running at the gym; he just works through it. 03/07/13 patient was lying in bed and started to have chest pain, left arm pain, SOB, nausea, and lightheadedness. The chest pain was 8/10; it was the same feeling as his heart was being squeezed. He did not take anything for the pain; nothing made it better or worse. He attributed the SOB to his COPD; albuterol did not alleviate SOB. For the next 2 days he continued to go to work; the pain was continuous but would change  in intensity from 8/10 - 4/10. Last night, 03/10/13 he decided to go to the ED since the pain did not go away. He was given asa, nitroglycerin patch, tylenol; once given morphine he was 0/10. SOB, nausea, and lightheadedness went away. ECG showed lateral ST elevations; he had same findings on ECG 02/2010. Troponin (-) x 2. CXR normal. Was admitted.   7:00 am this morning the chest and arm pain returned 6/10; he denies SOB, nausea, diaphoresis, lightheadedness.   Inpatient Medications  . aspirin EC  325 mg Oral Daily  . diltiazem  240 mg Oral Daily  . enoxaparin (LOVENOX) injection  40 mg Subcutaneous Q24H  . finasteride  5 mg Oral Daily  . pantoprazole  40 mg Oral Daily  . sodium chloride  3 mL Intravenous Q12H  . sodium chloride  3 mL Intravenous Q12H  . tamsulosin  0.4 mg Oral QPC supper  . triamterene-hydrochlorothiazide  1 capsule Oral Daily    Family History Family History  Problem Relation Age of Onset  . CAD Mother     died in her 32's - MI  . Multiple myeloma Brother   . Sickle cell anemia Father     died @ 72     Social History History   Social History  . Marital Status: Married    Spouse Name: N/A    Number of Children: N/A  . Years  of Education: N/A   Occupational History  . Not on file.   Social History Main Topics  . Smoking status: Never Smoker   . Smokeless tobacco: Not on file  . Alcohol Use: No  . Drug Use: No  . Sexual Activity: No   Other Topics Concern  . Not on file   Social History Narrative   Lives in [place] with dtr.     Review of Systems  General:  No chills, fever, night sweats or weight changes.  Cardiovascular:  (+) chest pain; no dyspnea on exertion, edema, orthopnea, palpitations, paroxysmal nocturnal dyspnea. Dermatological: No rash, lesions/masses Respiratory: No cough, dyspnea Urologic: No hematuria, dysuria.  +++ difficulty starting stream/h/o bph. Abdominal:   (+) nausea; no vomiting, diarrhea, bright red blood per rectum,  melena, or hematemesis Neurologic:  No visual changes, wkns, changes in mental status. All other systems reviewed and are otherwise negative except as noted above.  Physical Exam  Blood pressure 112/77, pulse 58, temperature 97.7 F (36.5 C), temperature source Oral, resp. rate 11, height 6' (1.829 m), weight 192 lb (87.091 kg), SpO2 97.00%.  General: Pleasant, NAD Psych: Normal affect. Neuro: Alert and oriented X 3. Moves all extremities spontaneously. HEENT: Normal  Neck: Supple without bruits or JVD. Lungs:  Resp regular and unlabored, CTA. Heart: RRR no s3, s4, or murmurs. Abdomen: Soft, non-tender, non-distended, BS + x 4.  Extremities: No clubbing, cyanosis or edema. DP/PT/Radials 2+ and equal bilaterally.  Labs   Recent Labs  03/09/13 2210 03/10/13 0352 03/10/13 0940  TROPONINI <0.30 <0.30 <0.30   Lab Results  Component Value Date   WBC 6.6 03/10/2013   HGB 13.9 03/10/2013   HCT 40.1 03/10/2013   MCV 84.1 03/10/2013   PLT 217 03/10/2013     Recent Labs Lab 03/10/13 0940  NA 141  K 3.1*  CL 102  CO2 29  BUN 12  CREATININE 1.33  CALCIUM 9.2  PROT 7.5  BILITOT 0.7  ALKPHOS 68  ALT 26  AST 35  GLUCOSE 107*   Lab Results  Component Value Date   DDIMER <0.27 03/09/2013    Radiology/Studies  Dg Chest 2 View  03/09/2013   CLINICAL DATA:  Left chest pain, shortness of breath.  EXAM: CHEST  2 VIEW  COMPARISON:  05/09/2009  FINDINGS: The heart size and mediastinal contours are within normal limits. Both lungs are clear. No pleural effusion or pneumothorax. Multilevel degenerative changes and flowing anterior osteophytes.  IMPRESSION: No active cardiopulmonary disease.   Electronically Signed   By: Jearld Lesch M.D.   On: 03/09/2013 23:27   ECG  Sinus bradycardia 59bpm, ~41mm ST elevation in I, aVL, st dep and twi in III - similar to old ecg's.  ASSESSMENT AND PLAN  1. Unstable Angina- Patient has a Hx of angina with left arm pain that has been occuring  for years. These occurences are frequent but last only a few minutes and tend to go away when he rests. Patient sometimes experiences lightheadedness with the chest pain when running. His current episode lasted for 3 days and was accompanied by SOB, nausea,and lightheadedness which was resolved when given morphine. Troponin (-) x 2, ECG showed ST elevations but was the same as 2011 (stress test was normal). CXR normal. Echo was ordered; waiting for results. -catheretization to rule out infarct cause of ST elevations and angina. Last cath 2004. -Cardiac MRI  2. Hypokalemia- potassium 3.5. -Give K-Dur  [name], [employer] NP Student  Addendum  Patient seen and examined with Ms. Cloyde Reams and prior records extensively reviewed. He is a 52 year old male with prior history of chest pain and abnormal ECG dating back quite some time. He had a normal cardiac catheterization 2004 and subsequently a normal stress echocardiogram in 2013. He has lateral ST segment elevation dating back to ECGs in 2011 in the computer. He was in his usual state of health until this past Sunday evening when he developed left shoulder pain and later left chest discomfort. This was constant from Sunday evening until yesterday morning. Over the same period of time, he has been experiencing more dyspnea and abdominal discomfort. Despite having this discomfort, he has been working in symptoms are not worsened with exertion. Yesterday, symptoms were more intermittent in nature, with severe left-sided chest squeezing coming on lasting about 5 or 10 seconds and then resolving before returning within about 30 minutes. He called his primary care provider yesterday afternoon and was advised to present to the ED for evaluation. Here, ECG shows lateral ST segment elevation with inferior ST depression, similar to prior ECGs. Though he was pain-free overnight, he had recurrent chest pain this morning which is been more persistent. ECG was slightly  more pronounced lateral ST elevation of troponins have been normal. Chest pain denies any worse with palpation, deep breathing, or position changes. Vital signs are currently stable.  See above for FH/SH/ROS (all obtained by me)  Exam: Pleasant African American male no acute distress, awake and oriented x3, normal affect. Neuro is grossly intact and nonfocal. Skin is warm and dry without lesions or masses. Neck is supple without bruits or JVD. Lungs respirations regular and unlabored, clear to auscultation. Cardiac regular S1-S2, no S3, S4, or murmurs. Abdomen is round soft, nontender, nondistended, bowel sounds present x4. Extremities are warm and dry without clubbing, cyanosis, or edema. Distal pulses are 2+ and equal bilaterally.  Assessment and plan: 1. Unstable angina/abnormal ECG: Patient presents with a 2 to three-day history of persistent chest discomfort and abnormal ECG. ECG abnormalities date back some time. Despite prolonged symptoms and Sunday evening, his troponins are normal. He had a normal catheterization 2004 and a negative stress echocardiogram in 2013. We will plan to evaluate cardiac MRI today to assess for possible scar and/or infiltrative disease which could potentially account for his ECG changes. Because of ongoing chest discomfort, we'll also arrange for diagnostic cardiac catheterization tomorrow to rule out obstructive CAD.  Continue aspirin therapy. Regardless of outcome of testing, patient should be provided with a copy of his ECG to carry with him at all times.  2. Hypertension: Stable.  3. Hypokalemia: Supplement.  Signed, Nicolasa Ducking, NP 03/10/2013, 11:11 AM  Patient examined chart reviewed. Chronically abnormal ECG with lateral ST elevation especially in I and AVL with inferior T wave inversions. No rub on exam.  Will arrange MRI today to r/o infiltrative process that may contribute to chronically abnormal ECG , aneurysm, sarcoid or other Given persistant  nature of pain and some relief with nitro will cath in a.m. Agree with prolonged pain and negative troponin urgent cath not indicated  Charlton Haws

## 2013-03-11 NOTE — Progress Notes (Signed)
Pt transferred to cath lab with tech and RN on monitor; pt's cellphone given to family in waiting room; emotional support given to pt & to family

## 2013-03-11 NOTE — Progress Notes (Signed)
[facility] Progress Note [facility] TEAM 1 - Stepdown ICU Team   [name] AVW:[id number] DOB: [birth year] DOA: 03/09/2013 PCP: Tomma Lightning, MD  Brief narrative: 52 year old male patient with history of hypertension and COPD. Presented to medicine are hypoactive complaints of chest pain for 2 days. The pain is located on the left side anteriorly nonradiating and is pressure-like lasting a few minutes with recurrence. Patient has chronic shortness of breath and this is not associated with chest pain. No other symptoms. Initial EKG showed ST segment elevation in leads 1 aVL. On-call cardiologist was consulted reviewed the EKG and felt these changes were not significant. They were similar to previous EKGs. Initial cardiac markers were negative. The patient's chest pain only improved after morphine and has been recurrent since that time. Of note patient had a normal cardiac catheterization in 2004. Echocardiogram in 2005 revealed normal systolic function without any regional wall motion abnormalities.  Assessment/Plan: Chest pain  -Cardiac enzymes have remained negative despite ongoing chest pain  -EKG done during chest pain demonstrated apparent new T-wave inversion in leads III, aVF, and V1; subsequent review of previous admission EKGs demonstrated similar findings. Because of these abnormal findings cardiology recommended patient be provided a copy of his EKG time of discharge to carry with him at all times.  -Continue aspirin; if diagnostic workup consistent with cardiac ischemia consider changing diltiazem to beta blocker -Cardiology was consulted and an ordered a cardiac MRI to assess for possible scar and/or infiltrative disease which could account for the EKG changes. In addition because of ongoing chest discomfort a diagnostic cardiac catheterization is planned for 03/11/2013 to rule out obstructive CAD.  -2-D echocardiogram was completed that demonstrated preserved LV function  without regional wall motion abnormalities only mild stable LVH  - cardiac cath negative for stenosis - no reproducible pain - will obtain D dimer and if elevated will order a Ct of the chest- may not be completed right away due to receiving contrast today.  - add Percocet so pt can cut back on Morphine use  Hypertension  -Low pressure has remained stable throughout the hospitalization no changes made to the medications at this time   COPD  -stable without decompensation   DVT prophylaxis: Lovenox Code Status: Full Family Communication: Patient Disposition Plan/Expected LOS: transfer to med/surg Isolation: None Nutritional Status: Stable  Consultants: Cardiology   Procedures: 2-D echocardiogram  Left ventricle: The cavity size was normal. Wall thickness was increased in a pattern of mild LVH. Systolic function was normal. The estimated ejection fraction was in the range of 55% to 60%. Wall motion was normal; there were no regional wall motion abnormalities.   Left heart catheterization   pending   Antibiotics: None  HPI/Subjective: Patient continues to complain of atypical chest discomfort which lasts a few seconds and feels like a squeezing. Still radiates down left arm occasionally. No symptoms of reflux. Not associated with cough and does not change with movement.   Objective: Blood pressure 134/88, pulse 57, temperature 98.1 F (36.7 C), temperature source Oral, resp. rate 18, height 6' (1.829 m), weight 87.091 kg (192 lb), SpO2 98.00%.  Intake/Output Summary (Last 24 hours) at 03/11/13 1713 Last data filed at 03/11/13 1330  Gross per 24 hour  Intake 874.85 ml  Output   1100 ml  Net -225.15 ml     Exam:  General: No acute respiratory distresss  Lungs: Clear to auscultation bilaterally without wheezes or crackles  Chest: no chest wall tenderness Cardiovascular: Regular  rate and rhythm without murmur gallop or rub normal S1 and S2  Abdomen: Nontender,  nondistended, soft, bowel sounds positive, no rebound, no ascites, no appreciable mass  Extremities: No significant cyanosis, clubbing, or edema bilateral lower extremities    Scheduled Meds:  Scheduled Meds: . aspirin EC  325 mg Oral Daily  . diltiazem  240 mg Oral Daily  . enoxaparin (LOVENOX) injection  40 mg Subcutaneous Q24H  . finasteride  5 mg Oral Daily  . influenza vac split quadrivalent PF  0.5 mL Intramuscular Once  . pantoprazole  40 mg Oral Daily  . sodium chloride  3 mL Intravenous Q12H  . sodium chloride  3 mL Intravenous Q12H  . tamsulosin  0.4 mg Oral QPC supper  . triamterene-hydrochlorothiazide  1 capsule Oral Daily   Continuous Infusions: . sodium chloride 20 mL/hr at 03/10/13 0845  . sodium chloride      **Reviewed in detail by the Attending Physician  Data Reviewed: Basic Metabolic Panel:  Recent Labs Lab 03/09/13 2210 03/10/13 0940 03/11/13 0352  NA 143 141 139  K 3.5 3.1* 3.8  CL 102 102 102  CO2 32 29 24  GLUCOSE 100* 107* 82  BUN 13 12 11   CREATININE 1.40* 1.33 1.27  CALCIUM 9.8 9.2 9.3   Liver Function Tests:  Recent Labs Lab 03/09/13 2210 03/10/13 0940  AST 44* 35  ALT 33 26  ALKPHOS 80 68  BILITOT 0.5 0.7  PROT 8.5* 7.5  ALBUMIN 4.3 3.8   No results found for this basename: LIPASE, AMYLASE,  in the last 168 hours No results found for this basename: AMMONIA,  in the last 168 hours CBC:  Recent Labs Lab 03/09/13 2210 03/10/13 0940  WBC 8.3 6.6  NEUTROABS 4.3 3.4  HGB 14.9 13.9  HCT 43.3 40.1  MCV 84.2 84.1  PLT 227 217   Cardiac Enzymes:  Recent Labs Lab 03/09/13 2210 03/10/13 0352 03/10/13 0940 03/10/13 1556  TROPONINI <0.30 <0.30 <0.30 <0.30   BNP (last 3 results) No results found for this basename: PROBNP,  in the last 8760 hours CBG: No results found for this basename: GLUCAP,  in the last 168 hours  Recent Results (from the past 240 hour(s))  MRSA PCR SCREENING     Status: None   Collection Time     03/10/13  3:00 AM      Result Value Range Status   MRSA by PCR NEGATIVE  NEGATIVE Final   Comment:            The GeneXpert MRSA Assay (FDA     approved for NASAL specimens     only), is one component of a     comprehensive MRSA colonization     surveillance program. It is not     intended to diagnose MRSA     infection nor to guide or     monitor treatment for     MRSA infections.     Studies:  Recent x-ray studies have been reviewed in detail by the Attending Physician   Laportia Carley,MD [phone] 03/11/2013, 5:13 PM

## 2013-03-11 NOTE — Progress Notes (Signed)
TR BAND REMOVAL  LOCATION:  right radial  DEFLATED PER PROTOCOL:  yes  TIME BAND OFF / DRESSING APPLIED:   1425   SITE UPON ARRIVAL:   Level 0  SITE AFTER BAND REMOVAL:  Level 0  REVERSE ALLEN'S TEST:    positive  CIRCULATION SENSATION AND MOVEMENT:  Within Normal Limits  yes  COMMENTS:

## 2013-03-11 NOTE — Interval H&P Note (Signed)
Cath Lab Visit (complete for each Cath Lab visit)  Clinical Evaluation Leading to the Procedure:   ACS: yes  Non-ACS:    Anginal Classification: CCS IV  Anti-ischemic medical therapy: Maximal Therapy (2 or more classes of medications)  Non-Invasive Test Results: No non-invasive testing performed  Prior CABG: No previous CABG      History and Physical Interval Note:  03/11/2013 10:47 AM  [name]  has presented today for surgery, with the diagnosis of unstable anginaThe various methods of treatment have been discussed with the patient and family. After consideration of risks, benefits and other options for treatment, the patient has consented to  Procedure(s): LEFT HEART CATHETERIZATION WITH CORONARY ANGIOGRAM (N/A) as a surgical intervention .  The patient's history has been reviewed, patient examined, no change in status, stable for surgery.  I have reviewed the patient's chart and labs.  Questions were answered to the patient's satisfaction.     Tashayla Therien

## 2013-03-11 NOTE — CV Procedure (Signed)
Cardiac Cath Procedure Note:  Indication: Unstable angina  Procedures performed:  1) Selective coronary angiography 2) Left heart catheterization 3) Left ventriculogram  Description of procedure:   The risks and indication of the procedure were explained. Consent was signed and placed on the chart. An appropriate timeout was taken prior to the procedure. After a normal Allen's test was confirmed, the right wrist was prepped and draped in the routine sterile fashion and anesthetized with 1% local lidocaine.   A 5 FR arterial sheath was then placed in the right radial artery using a modified Seldinger technique. Systemic heparin was administered. 3mg  IV verapamil was given through the sheath. Standard catheters including a JR4 (used for left coronary system), AL-1 (used for RCA with high anterior take-off) and angled pigtail. All catheter exchanges were made over a wire.  Complications:  None apparent  Total contrast: 100 cc Findings:  Ao Pressure: 114/72 (92) LV Pressure:  109/5/12 There was no signficant gradient across the aortic valve on pullback.  1. Left main: Angiographically normal.  2. LAD: Small vessel which does not reach the apex of the heart. It gives  rise to two diagonal branches. It is angiographically normal.  3. Circumflex: Moderate size vessel giving rise to two obtuse marginals.  It is angiographically normal.  4. RCA: Large, dominant vessel with a high anterior takeoff. The vessel  was tortuous, but angiographically normal.   LV: Limited due to ectopy. EF is normal. Probable LVH.  IMPRESSION/RECOMMENDATIONS:  1. Angiographically normal coronary arteries. Suspect noncardiac etiology  to this chest discomfort.  2. Normal left ventricular size and systolic function.  3. No aortic stenosis or mitral regurgitation.  Assessment:  No further cardiac work-up needed. Can be discharged today from cardiac perspective. Consider other sources of noncardiac  CP.  Daniel Bensimhon,MD 11:27 AM

## 2013-03-12 MED ORDER — OXYCODONE HCL 5 MG PO TABS: 5.0000 mg | ORAL_TABLET | Freq: Four times a day (QID) | ORAL | Status: DC | PRN

## 2013-03-12 MED ORDER — NITROGLYCERIN 0.4 MG SL SUBL: 0.4000 mg | SUBLINGUAL_TABLET | SUBLINGUAL | Status: AC | PRN

## 2013-03-12 MED ORDER — BISACODYL 5 MG PO TBEC
10.0000 mg | DELAYED_RELEASE_TABLET | Freq: Once | ORAL | Status: AC
  Administered 2013-03-12: 10 mg via ORAL
  Filled 2013-03-12: qty 2

## 2013-03-12 NOTE — Progress Notes (Addendum)
Spoke with MD on call. No orders given. Will continue to monitor pt.

## 2013-03-12 NOTE — Progress Notes (Signed)
Pt states "my chest started to hurt when I sat up to use the urinal." Pt reports chest pain as "7 out of 10" radiating to left arm. V/s stable.  One nitro tab given. Will continue to monitor .

## 2013-03-12 NOTE — Progress Notes (Signed)
Patient discharge teaching given, including activity, diet, follow-up appoints, and medications. Patient verbalized understanding of all discharge instructions. IV access was d/c'd. Vitals are stable. Skin is intact except as charted in most recent assessments. Pt to be escorted out by NT, to be driven home by family. 

## 2013-03-12 NOTE — Progress Notes (Signed)
Pt denies any current chest pain. V/s stable. Prn tylenol given for headache. Will notify NP on call. Will continue to monitor pt.

## 2013-03-12 NOTE — Progress Notes (Signed)
Pt denies any current chest pain. V/s stable . Will make on call MD aware. Will continue to monitor pt.

## 2013-03-12 NOTE — Care Management Note (Signed)
    Page 1 of 1   03/12/2013     12:43:36 PM   CARE MANAGEMENT NOTE 03/12/2013  Patient:  [name], [name]   Account Number:  [id number]  Date Initiated:  03/12/2013  Documentation initiated by:  Letha Cape  Subjective/Objective Assessment:   dx chest pain  admit- lives with family.  pta indep.     Action/Plan:   Anticipated DC Date:  03/12/2013   Anticipated DC Plan:  HOME/SELF CARE      DC Planning Services  CM consult      Choice offered to / List presented to:             Status of service:  Completed, signed off Medicare Important Message given?   (If response is "NO", the following Medicare IM given date fields will be blank) Date Medicare IM given:   Date Additional Medicare IM given:    Discharge Disposition:  HOME/SELF CARE  Per UR Regulation:  Reviewed for med. necessity/level of care/duration of stay  If discussed at Long Length of Stay Meetings, dates discussed:    Comments:  03/12/13 12:40 Letha Cape RN, BSN [phone] patient lives with family, pta indep. Patient is s/p heart cath and for dc today.  Patient  mentioned that his pulmonlogist had ordered a cpap for him  and he has not received it yet.  NCM checked with [facility] and they state they do not have anything ordered for this patient.  NCM suggested to patient to have his pulmonologist order it again and he will have to submit sleep study  etc.  Patient states he will call his pulmonologist.

## 2013-03-12 NOTE — Discharge Summary (Addendum)
[facility]                                                                                   [name], is a 52 y.o. male  DOB March 07, 1961  MRN [id number].  Admission date:  03/09/2013  Admitting Physician  Eduard Clos, MD  Discharge Date:  03/12/2013   Primary MD  Tomma Lightning, MD  Recommendations for primary care physician for things to follow:      Admission Diagnosis  SOB (shortness of breath) [786.05] Chest pain [786.50]  Discharge Diagnosis  noncardiac musculoskeletal chest pain  Principal Problem:   Chest pain Active Problems:   HTN (hypertension)   Nonspecific abnormal electrocardiogram (ECG) (EKG)      Past Medical History  Diagnosis Date  . COPD (chronic obstructive pulmonary disease)   . Enlarged prostate   . Sleep apnea   . Hypertension   . Chest pain     a. 11/2002 Cath: EF 68%, LM nl, LAD small, nl, LCX nl, RCA tortuous/nl;  b. 08/2011 St Echo: EF 60%, normal contractility, no scar/ischemia.  . Sickle cell trait     Past Surgical History  Procedure Laterality Date  . Tonsillectomy    . Cardiac catheterization       Discharge Condition: Table   Follow-up Information   Follow up with Tomma Lightning, MD In 2 weeks. (Regarding your non cardiac chest pain)    Specialty:  Family Medicine   Contact information:   [address], [address] [facility] [place] [address] [phone]       Follow up with Stan Head, MD. Schedule an appointment as soon as possible for a visit in 1 week.   Specialty:  Gastroenterology   Contact information:   [address] [place] [address] [phone]         Consults obtained - cardiology   Discharge Medications      Medication List         diltiazem 240 MG 24 hr capsule  Commonly known as:  DILACOR XR  Take 240 mg by mouth daily.     ibuprofen 800 MG tablet  Commonly known as:  ADVIL,MOTRIN  Take 800 mg by mouth every 8 (eight) hours as  needed.     omeprazole 10 MG capsule  Commonly known as:  PRILOSEC  Take 40 mg by mouth daily.     oxyCODONE 5 MG immediate release tablet  Commonly known as:  Oxy IR/ROXICODONE  Take 1 tablet (5 mg total) by mouth every 6 (six) hours as needed for moderate pain or severe pain.     tamsulosin 0.4 MG Caps capsule  Commonly known as:  FLOMAX  Take 0.4 mg by mouth daily.     tiotropium 18 MCG inhalation capsule  Commonly known as:  SPIRIVA  Place 18 mcg into inhaler and inhale daily.     triamterene-hydrochlorothiazide 37.5-25 MG per capsule  Commonly known as:  DYAZIDE  Take 1 capsule by mouth daily.         Diet and Activity recommendation: See Discharge Instructions below   Discharge Instructions      Follow  with Primary MD Tomma Lightning, MD in 7 days   Get CBC, CMP, checked 7 days by Primary MD and again as instructed by your Primary MD. Get a 2 view Chest X ray done next visit if you had Pneumonia of Lung problems at the Hospital.  Get Medicines reviewed and adjusted.  Please request your Prim.MD to go over all Hospital Tests and Procedure/Radiological results at the follow up, please get all Hospital records sent to your Prim MD by signing hospital release before you go home.  Activity: As tolerated with Full fall precautions use walker/cane & assistance as needed   Diet:  Heart Healthy  For Heart failure patients - Check your Weight same time everyday, if you gain over 2 pounds, or you develop in leg swelling, experience more shortness of breath or chest pain, call your Primary MD immediately. Follow Cardiac Low Salt Diet and 1.8 lit/day fluid restriction.  Disposition Home    If you experience worsening of your admission symptoms, develop shortness of breath, life threatening emergency, suicidal or homicidal thoughts you must seek medical attention immediately by calling 911 or calling your MD immediately  if symptoms less severe.  You Must read complete  instructions/literature along with all the possible adverse reactions/side effects for all the Medicines you take and that have been prescribed to you. Take any new Medicines after you have completely understood and accpet all the possible adverse reactions/side effects.   Do not drive and provide baby sitting services if your were admitted for syncope or siezures until you have seen by Primary MD or a Neurologist and advised to do so again.  Do not drive when taking Pain medications.    Do not take more than prescribed Pain, Sleep and Anxiety Medications  Special Instructions: If you have smoked or chewed Tobacco  in the last 2 yrs please stop smoking, stop any regular Alcohol  and or any Recreational drug use.  Wear Seat belts while driving.   Please note  You were cared for by a hospitalist during your hospital stay. If you have any questions about your discharge medications or the care you received while you were in the hospital after you are discharged, you can call the unit and asked to speak with the hospitalist on call if the hospitalist that took care of you is not available. Once you are discharged, your primary care physician will handle any further medical issues. Please note that NO REFILLS for any discharge medications will be authorized once you are discharged, as it is imperative that you return to your primary care physician (or establish a relationship with a primary care physician if you do not have one) for your aftercare needs so that they can reassess your need for medications and monitor your lab values.   Major procedures and Radiology Reports - PLEASE review detailed and final reports for all details, in brief -   Left heart cath  IMPRESSION/RECOMMENDATIONS:  1. Angiographically normal coronary arteries. Suspect noncardiac etiology  to this chest discomfort.  2. Normal left ventricular size and systolic function.  3. No aortic stenosis or mitral regurgitation.   Assessment:  No further cardiac work-up needed. Can be discharged today from cardiac perspective. Consider other sources of noncardiac CP.   Echo   Study Conclusions  Left ventricle: The cavity size was normal. Wall thickness was increased in a pattern of mild LVH. Systolic function was normal. The estimated ejection fraction was in the range of 55% to 60%. Wall  motion was normal; there were no regional wall motion abnormalities.     Dg Chest 2 View  03/09/2013   CLINICAL DATA:  Left chest pain, shortness of breath.  EXAM: CHEST  2 VIEW  COMPARISON:  05/09/2009  FINDINGS: The heart size and mediastinal contours are within normal limits. Both lungs are clear. No pleural effusion or pneumothorax. Multilevel degenerative changes and flowing anterior osteophytes.  IMPRESSION: No active cardiopulmonary disease.   Electronically Signed   By: Jearld Lesch M.D.   On: 03/09/2013 23:27   Mr Card Morphology Wo/w Cm  03/10/2013   CLINICAL DATA:  Chest Pain with abnormal ECG Lateral ST segment elevation  EXAM: CARDIAC MRI  TECHNIQUE: The patient was scanned on a 1.5 Tesla GE magnet. A dedicated cardiac coil was used. Functional imaging was done using Fiesta sequences. 2,3, and 4 chamber views were done to assess for RWMA's. Modified Simpson's rule using a short axis stack was used to calculate an ejection fraction on a dedicated work [employer]. The patient received 38 cc of Multihance. After 10 minutes inversion recovery sequences were used to assess for infiltration and scar tissue.  CONTRAST:  38 cc of Multihance  FINDINGS: All 4 cardiac chambers were normal in size and function. There was moderate basal septal hypertrophy 16mm. There was no SAM or HOCM. There were no discrete RWMAls The atrial septum was redundant with no PFO or ASD. There was no pericardial effusion The AV, MV and TV were structurally normal. The quantitative EF was 58% (EDV 157cc ESV 67cc SV 90cc) Delayed  enhancement images showed no infiltration or scar tissue  IMPRESSION: 1) Normal LV cavity size and function EF 58%  2) No hyperenhancement or scar tissue in LV myocardium  3) Moderate basal septal hypertrophy with no HOCM or SAM  4) No pericardial effusion  Charlton Haws   Electronically Signed   By: Charlton Haws M.D.   On: 03/10/2013 19:05    Micro Results     Recent Results (from the past 240 hour(s))  MRSA PCR SCREENING     Status: None   Collection Time    03/10/13  3:00 AM      Result Value Range Status   MRSA by PCR NEGATIVE  NEGATIVE Final   Comment:            The GeneXpert MRSA Assay (FDA     approved for NASAL specimens     only), is one component of a     comprehensive MRSA colonization     surveillance program. It is not     intended to diagnose MRSA     infection nor to guide or     monitor treatment for     MRSA infections.     History of present illness and  Hospital Course:     Kindly see H&P for history of present illness and admission details, please review complete Labs, Consult reports and Test reports for all details in brief [name], is a 53 y.o. male, patient with history of  GERD, sickle cell trait, COPD admitted to the hospital for left-sided chest wall likely musculoskeletal chest pain with nonspecific ST changes which included ST elevation however with negative troponins, he was seen by cardiology, he had unremarkable echogram, cardiac MRI and left heart cath. His d-dimer was in negative range x3, chest pain is thought to be either in GI origin or musculoskeletal. He has had complete workup which has been unrevealing  and chest pain likely his normal life threatening in the region. He will be discharged on PPI I have requested him to followup with his PCP and GI physician for a possible GI source of chest pain workup.   Patient is hemodynamically stable, lab work is unremarkable, as noted above echogram, cardiac MRI and left heart catheterization  unremarkable. Troponin negative x3, d-dimer negative x3. He will be discharged on PPI.   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 And tells me that the chest pain has been present for a few months now and it's not new.  Today   Subjective:   [name] today has no headache,no  abdominal pain,no new weakness tingling or numbness, feels much better wants to go home today , mild R sided non pleuritic chest wall pain.  Objective:   Blood pressure 124/78, pulse 60, temperature 97 F (36.1 C), temperature source Oral, resp. rate 18, height 6' (1.829 m), weight 88.587 kg (195 lb 4.8 oz), SpO2 97.00%.   Intake/Output Summary (Last 24 hours) at 03/12/13 1032 Last data filed at 03/12/13 0246  Gross per 24 hour  Intake 641.67 ml  Output    700 ml  Net -58.33 ml    Exam Awake Alert, Oriented *3, No new F.N deficits, Normal affect Lehr.AT,PERRAL Supple Neck,No JVD, No cervical lymphadenopathy appriciated.  Symmetrical Chest wall movement, Good air movement bilaterally, CTAB RRR,No Gallops,Rubs or new Murmurs, No Parasternal Heave +ve B.Sounds, Abd Soft, Non tender, No organomegaly appriciated, No rebound -guarding or rigidity. No Cyanosis, Clubbing or edema, No new Rash or bruise  Data Review   CBC w Diff: Lab Results  Component Value Date   WBC 6.6 03/10/2013   HGB 13.9 03/10/2013   HCT 40.1 03/10/2013   PLT 217 03/10/2013   LYMPHOPCT 32 03/10/2013   MONOPCT 11 03/10/2013   EOSPCT 6* 03/10/2013   BASOPCT 1 03/10/2013    CMP: Lab Results  Component Value Date   NA 139 03/11/2013   K 3.8 03/11/2013   CL 102 03/11/2013   CO2 24 03/11/2013   BUN 11 03/11/2013   CREATININE 1.27 03/11/2013   PROT 7.5 03/10/2013   ALBUMIN 3.8 03/10/2013   BILITOT 0.7 03/10/2013   ALKPHOS 68 03/10/2013   AST 35 03/10/2013   ALT 26 03/10/2013  .   Total Time in preparing  paper work, data evaluation and todays exam - 35 minutes  Leroy Sea M.D on 03/12/2013 at 10:32 AM  [facility] Office  [phone]

## 2013-03-12 NOTE — Progress Notes (Signed)
Pt resting well. Denies any current chest pain. Will continue to monitor.

## 2013-03-12 NOTE — Progress Notes (Signed)
Pt report chest pain radiating to left arm . Nitro tab given.

## 2013-03-21 DIAGNOSIS — G4733 Obstructive sleep apnea (adult) (pediatric): Secondary | ICD-10-CM | POA: Insufficient documentation

## 2013-08-20 DIAGNOSIS — N3281 Overactive bladder: Secondary | ICD-10-CM | POA: Insufficient documentation

## 2013-12-05 ENCOUNTER — Encounter (HOSPITAL_BASED_OUTPATIENT_CLINIC_OR_DEPARTMENT_OTHER): Payer: Self-pay | Admitting: Emergency Medicine

## 2013-12-05 ENCOUNTER — Observation Stay (HOSPITAL_BASED_OUTPATIENT_CLINIC_OR_DEPARTMENT_OTHER)
Admission: EM | Admit: 2013-12-05 | Discharge: 2013-12-08 | Disposition: A | Discharge status: Home / Self Care | Payer: Medicare PPO | Attending: Cardiovascular Disease | Admitting: Cardiovascular Disease

## 2013-12-05 DIAGNOSIS — J449 Chronic obstructive pulmonary disease, unspecified: Secondary | ICD-10-CM | POA: Insufficient documentation

## 2013-12-05 DIAGNOSIS — I10 Essential (primary) hypertension: Secondary | ICD-10-CM | POA: Diagnosis not present

## 2013-12-05 DIAGNOSIS — M25519 Pain in unspecified shoulder: Secondary | ICD-10-CM

## 2013-12-05 DIAGNOSIS — D573 Sickle-cell trait: Secondary | ICD-10-CM | POA: Diagnosis not present

## 2013-12-05 DIAGNOSIS — Z79899 Other long term (current) drug therapy: Secondary | ICD-10-CM | POA: Insufficient documentation

## 2013-12-05 DIAGNOSIS — G4730 Sleep apnea, unspecified: Secondary | ICD-10-CM | POA: Insufficient documentation

## 2013-12-05 DIAGNOSIS — N400 Enlarged prostate without lower urinary tract symptoms: Secondary | ICD-10-CM | POA: Insufficient documentation

## 2013-12-05 DIAGNOSIS — I219 Acute myocardial infarction, unspecified: Principal | ICD-10-CM | POA: Insufficient documentation

## 2013-12-05 DIAGNOSIS — M542 Cervicalgia: Secondary | ICD-10-CM | POA: Diagnosis not present

## 2013-12-05 DIAGNOSIS — E876 Hypokalemia: Secondary | ICD-10-CM | POA: Diagnosis present

## 2013-12-05 DIAGNOSIS — R072 Precordial pain: Secondary | ICD-10-CM

## 2013-12-05 DIAGNOSIS — R079 Chest pain, unspecified: Secondary | ICD-10-CM

## 2013-12-05 DIAGNOSIS — R5383 Other fatigue: Secondary | ICD-10-CM

## 2013-12-05 DIAGNOSIS — M25512 Pain in left shoulder: Secondary | ICD-10-CM

## 2013-12-05 DIAGNOSIS — J4489 Other specified chronic obstructive pulmonary disease: Secondary | ICD-10-CM | POA: Insufficient documentation

## 2013-12-05 DIAGNOSIS — I213 ST elevation (STEMI) myocardial infarction of unspecified site: Secondary | ICD-10-CM

## 2013-12-05 DIAGNOSIS — R5381 Other malaise: Secondary | ICD-10-CM

## 2013-12-05 DIAGNOSIS — R0789 Other chest pain: Secondary | ICD-10-CM

## 2013-12-05 DIAGNOSIS — R9431 Abnormal electrocardiogram [ECG] [EKG]: Secondary | ICD-10-CM

## 2013-12-05 DIAGNOSIS — R109 Unspecified abdominal pain: Secondary | ICD-10-CM | POA: Diagnosis present

## 2013-12-05 DIAGNOSIS — R1084 Generalized abdominal pain: Secondary | ICD-10-CM

## 2013-12-05 DIAGNOSIS — R197 Diarrhea, unspecified: Secondary | ICD-10-CM

## 2013-12-05 LAB — CBC WITH DIFFERENTIAL/PLATELET
Basophils Absolute: 0 10*3/uL (ref 0.0–0.1)
Basophils Relative: 0 % (ref 0–1)
Eosinophils Absolute: 0.3 10*3/uL (ref 0.0–0.7)
Eosinophils Relative: 4 % (ref 0–5)
HCT: 41.7 % (ref 39.0–52.0)
Hemoglobin: 14.3 g/dL (ref 13.0–17.0)
Lymphocytes Relative: 34 % (ref 12–46)
Lymphs Abs: 2 10*3/uL (ref 0.7–4.0)
MCH: 29.1 pg (ref 26.0–34.0)
MCHC: 34.3 g/dL (ref 30.0–36.0)
MCV: 84.8 fL (ref 78.0–100.0)
Monocytes Absolute: 0.7 10*3/uL (ref 0.1–1.0)
Monocytes Relative: 13 % — ABNORMAL HIGH (ref 3–12)
Neutro Abs: 2.9 10*3/uL (ref 1.7–7.7)
Neutrophils Relative %: 49 % (ref 43–77)
Platelets: 194 10*3/uL (ref 150–400)
RBC: 4.92 MIL/uL (ref 4.22–5.81)
RDW: 12.8 % (ref 11.5–15.5)
WBC: 5.8 10*3/uL (ref 4.0–10.5)

## 2013-12-05 LAB — COMPREHENSIVE METABOLIC PANEL
ALT: 22 U/L (ref 0–53)
AST: 30 U/L (ref 0–37)
Albumin: 4.1 g/dL (ref 3.5–5.2)
Alkaline Phosphatase: 75 U/L (ref 39–117)
Anion gap: 13 (ref 5–15)
BUN: 10 mg/dL (ref 6–23)
CO2: 29 mEq/L (ref 19–32)
Calcium: 9.9 mg/dL (ref 8.4–10.5)
Chloride: 103 mEq/L (ref 96–112)
Creatinine, Ser: 1.3 mg/dL (ref 0.50–1.35)
GFR calc Af Amer: 70 mL/min — ABNORMAL LOW (ref >90)
GFR calc non Af Amer: 61 mL/min — ABNORMAL LOW (ref >90)
Glucose, Bld: 101 mg/dL — ABNORMAL HIGH (ref 70–99)
Potassium: 3.2 mEq/L — ABNORMAL LOW (ref 3.7–5.3)
Sodium: 145 mEq/L (ref 137–147)
Total Bilirubin: 0.7 mg/dL (ref 0.3–1.2)
Total Protein: 7.9 g/dL (ref 6.0–8.3)

## 2013-12-05 LAB — PROTIME-INR
INR: 0.97 (ref 0.00–1.49)
Prothrombin Time: 12.9 seconds (ref 11.6–15.2)

## 2013-12-05 LAB — TROPONIN I
Troponin I: <0.3 ng/mL (ref <0.30)
Troponin I: <0.3 ng/mL (ref <0.30)

## 2013-12-05 LAB — APTT: aPTT: 32 seconds (ref 24–37)

## 2013-12-05 LAB — MAGNESIUM: Magnesium: 2 mg/dL (ref 1.5–2.5)

## 2013-12-05 LAB — MRSA PCR SCREENING: MRSA by PCR: NEGATIVE

## 2013-12-05 MED ORDER — TIOTROPIUM BROMIDE MONOHYDRATE 18 MCG IN CAPS
18.0000 ug | ORAL_CAPSULE | Freq: Every day | RESPIRATORY_TRACT | Status: DC
Start: 2013-12-05 — End: 2013-12-08
  Administered 2013-12-06 – 2013-12-08 (×3): 18 ug via RESPIRATORY_TRACT
  Filled 2013-12-05: qty 5

## 2013-12-05 MED ORDER — ASPIRIN 81 MG PO CHEW: 324.0000 mg | CHEWABLE_TABLET | ORAL | Status: AC

## 2013-12-05 MED ORDER — ONDANSETRON HCL 4 MG/2ML IJ SOLN
4.0000 mg | Freq: Once | INTRAMUSCULAR | Status: AC
  Administered 2013-12-05: 4 mg via INTRAVENOUS

## 2013-12-05 MED ORDER — TRIAMTERENE-HCTZ 37.5-25 MG PO CAPS
1.0000 | ORAL_CAPSULE | Freq: Every day | ORAL | Status: DC
  Administered 2013-12-05 – 2013-12-08 (×4): 1 via ORAL
  Filled 2013-12-05 (×4): qty 1

## 2013-12-05 MED ORDER — MORPHINE SULFATE 4 MG/ML IJ SOLN
INTRAMUSCULAR | Status: AC
  Filled 2013-12-05: qty 1

## 2013-12-05 MED ORDER — MORPHINE SULFATE 2 MG/ML IJ SOLN
1.0000 mg | INTRAMUSCULAR | Status: DC | PRN
  Administered 2013-12-05 – 2013-12-06 (×3): 2 mg via INTRAVENOUS
  Filled 2013-12-05 (×4): qty 1

## 2013-12-05 MED ORDER — NITROGLYCERIN 0.4 MG SL SUBL: 0.4000 mg | SUBLINGUAL_TABLET | SUBLINGUAL | Status: DC | PRN

## 2013-12-05 MED ORDER — ONDANSETRON HCL 4 MG/2ML IJ SOLN
INTRAMUSCULAR | Status: AC
  Filled 2013-12-05: qty 2

## 2013-12-05 MED ORDER — ONDANSETRON HCL 4 MG/2ML IJ SOLN
4.0000 mg | Freq: Four times a day (QID) | INTRAMUSCULAR | Status: DC | PRN
  Administered 2013-12-06: 4 mg via INTRAVENOUS
  Filled 2013-12-05: qty 2

## 2013-12-05 MED ORDER — ASPIRIN 81 MG PO CHEW
324.0000 mg | CHEWABLE_TABLET | Freq: Once | ORAL | Status: AC
  Administered 2013-12-05: 324 mg via ORAL

## 2013-12-05 MED ORDER — ASPIRIN EC 81 MG PO TBEC
81.0000 mg | DELAYED_RELEASE_TABLET | Freq: Every day | ORAL | Status: DC
  Administered 2013-12-06 – 2013-12-08 (×3): 81 mg via ORAL
  Filled 2013-12-05 (×3): qty 1

## 2013-12-05 MED ORDER — ACETAMINOPHEN 325 MG PO TABS
650.0000 mg | ORAL_TABLET | ORAL | Status: DC | PRN
  Administered 2013-12-05 – 2013-12-07 (×5): 650 mg via ORAL
  Filled 2013-12-05 (×5): qty 2

## 2013-12-05 MED ORDER — NITROGLYCERIN 0.4 MG SL SUBL
0.4000 mg | SUBLINGUAL_TABLET | SUBLINGUAL | Status: DC | PRN
  Administered 2013-12-05: 0.4 mg via SUBLINGUAL
  Filled 2013-12-05: qty 1

## 2013-12-05 MED ORDER — ASPIRIN 81 MG PO CHEW
CHEWABLE_TABLET | ORAL | Status: AC
  Filled 2013-12-05: qty 4

## 2013-12-05 MED ORDER — DILTIAZEM HCL ER 240 MG PO CP24
240.0000 mg | ORAL_CAPSULE | Freq: Every day | ORAL | Status: DC
  Administered 2013-12-05 – 2013-12-07 (×3): 240 mg via ORAL
  Filled 2013-12-05 (×3): qty 1

## 2013-12-05 MED ORDER — NITROGLYCERIN 0.4 MG SL SUBL
0.4000 mg | SUBLINGUAL_TABLET | SUBLINGUAL | Status: DC | PRN
  Administered 2013-12-05: 0.4 mg via SUBLINGUAL

## 2013-12-05 MED ORDER — POTASSIUM CHLORIDE CRYS ER 20 MEQ PO TBCR
40.0000 meq | EXTENDED_RELEASE_TABLET | Freq: Once | ORAL | Status: AC
  Administered 2013-12-05: 40 meq via ORAL
  Filled 2013-12-05: qty 2

## 2013-12-05 MED ORDER — TAMSULOSIN HCL 0.4 MG PO CAPS
0.4000 mg | ORAL_CAPSULE | Freq: Every day | ORAL | Status: DC
  Administered 2013-12-05 – 2013-12-08 (×4): 0.4 mg via ORAL
  Filled 2013-12-05 (×4): qty 1

## 2013-12-05 MED ORDER — PANTOPRAZOLE SODIUM 40 MG PO TBEC
80.0000 mg | DELAYED_RELEASE_TABLET | Freq: Every day | ORAL | Status: DC
  Administered 2013-12-05 – 2013-12-08 (×4): 80 mg via ORAL
  Filled 2013-12-05 (×4): qty 2

## 2013-12-05 MED ORDER — NITROGLYCERIN IN D5W 200-5 MCG/ML-% IV SOLN
5.0000 ug/min | Freq: Once | INTRAVENOUS | Status: AC
  Administered 2013-12-05: 10 ug/min via INTRAVENOUS

## 2013-12-05 MED ORDER — ASPIRIN 300 MG RE SUPP
300.0000 mg | RECTAL | Status: AC
  Filled 2013-12-05: qty 1

## 2013-12-05 MED ORDER — NITROGLYCERIN IN D5W 200-5 MCG/ML-% IV SOLN
INTRAVENOUS | Status: AC
  Administered 2013-12-05: 10 ug/min via INTRAVENOUS
  Filled 2013-12-05: qty 250

## 2013-12-05 MED ORDER — NITROGLYCERIN 0.4 MG SL SUBL
SUBLINGUAL_TABLET | SUBLINGUAL | Status: AC
  Administered 2013-12-05: 0.4 mg via SUBLINGUAL
  Filled 2013-12-05: qty 3

## 2013-12-05 MED ORDER — MORPHINE SULFATE 4 MG/ML IJ SOLN
4.0000 mg | INTRAMUSCULAR | Status: AC | PRN
  Administered 2013-12-05 (×2): 4 mg via INTRAVENOUS
  Filled 2013-12-05: qty 1

## 2013-12-05 MED ORDER — HEPARIN SODIUM (PORCINE) 5000 UNIT/ML IJ SOLN
4000.0000 [IU] | Freq: Once | INTRAMUSCULAR | Status: AC
  Administered 2013-12-05: 4000 [IU] via INTRAVENOUS
  Filled 2013-12-05: qty 1

## 2013-12-05 MED ORDER — HEPARIN (PORCINE) IN NACL 100-0.45 UNIT/ML-% IJ SOLN
1000.0000 [IU]/h | Freq: Once | INTRAMUSCULAR | Status: AC
  Administered 2013-12-05: 1000 [IU]/h via INTRAVENOUS

## 2013-12-05 MED ORDER — HEPARIN (PORCINE) IN NACL 100-0.45 UNIT/ML-% IJ SOLN
INTRAMUSCULAR | Status: AC
  Filled 2013-12-05: qty 250

## 2013-12-05 MED ORDER — ISOSORBIDE MONONITRATE ER 30 MG PO TB24
30.0000 mg | ORAL_TABLET | Freq: Every day | ORAL | Status: DC
  Administered 2013-12-05 – 2013-12-08 (×4): 30 mg via ORAL
  Filled 2013-12-05 (×5): qty 1

## 2013-12-05 NOTE — Progress Notes (Signed)
ANTICOAGULATION CONSULT NOTE - Initial Consult  Pharmacy Consult for Heparin  Indication: chest pain/ACS  No Known Allergies  Patient Measurements: Height: 6' (182.9 cm) Weight: 183 lb (83.008 kg) IBW/kg (Calculated) : 77.6   Vital Signs: Temp: 97.4 F (36.3 C) (08/30 0947) Temp src: Oral (08/30 0947) BP: 150/89 mmHg (08/30 0947) Pulse Rate: 64 (08/30 0947)  Labs:  Recent Labs  12/05/13 0845  HGB 14.3  HCT 41.7  PLT 194  APTT 32  LABPROT 12.9  INR 0.97  CREATININE 1.30  TROPONINI <0.30    Estimated Creatinine Clearance: 71.3 ml/min (by C-G formula based on Cr of 1.3).   Medical History: Past Medical History  Diagnosis Date  . COPD (chronic obstructive pulmonary disease)   . Enlarged prostate   . Sleep apnea   . Hypertension   . Chest pain     a. 11/2002 Cath: EF 68%, LM nl, LAD small, nl, LCX nl, RCA tortuous/nl;  b. 08/2011 St Echo: EF 60%, normal contractility, no scar/ischemia.  . Sickle cell trait      Assessment: 52yom admitted for CP with ECG changes, Tp neg.  Has been cathed on multiple occassions with similar all showing normal coronaries.  CBC stable, Cr stable at baseline 1.3, K 3.2 slightly low.  Heparin drip started, ASA given.    Goal of Therapy:  Heparin level 0.3-0.7 units/ml Monitor platelets by anticoagulation protocol: Yes   Plan:  Heparin bolus 4000 uts IV x1 and heparin drip 1000 uts/hr  - started at [facility] Draw HL, CBC 6hr after start and daily  Bonnita Nasuti Pharm.D. CPP, BCPS Clinical Pharmacist [phone] 12/05/2013 10:10 AM

## 2013-12-05 NOTE — ED Provider Notes (Signed)
Pt transferred from [facility] for Cards evaluation. Pt comes in with atypical chest pains, with typical features (radiating to arm, neck, responding to nitro, diaphoresis). EKG shows   EKG here shows suttle ST elevation in v2 and mild inferior ST depression. Pt is on heparin and nitro gtt. Vitals are WNL.      Varney Biles, MD 12/05/13 1012

## 2013-12-05 NOTE — H&P (Signed)
Chief Complaint: Chest/left shoulder pain. Abdominal pain and diarrhea Primary Cardiologist: Dr. Domenic Polite    Patient Profile: Patient is a 53 year old male with PMH of HTN, chronic chest pain, COPD, and family Hx of CAD/ MI. Presented to ED with left shoulder/chest pain, with associated nausea, abdominal discomfort and diarrhea.   HPI: The patient is a 53 y/o male who has undergone extensive w/u in the past for chest pain. In December 2014, he presented to [facility] with chest pain. Per records, symptoms were described as substernal and occuring at rest, radiating to the left arm and associated with dyspnea, nausea and lightheadedness. His EKG at that time showed lateral ST elevations (Note: he has had same findings on EKG since 02/2010). Cardiac enzymes at that time were normal. CXR and D-dimer were also normal. He ultimately underwent a diagnostic LHC, performed by Dr. Haroldine Laws, on 03/11/2013. This revealed angiographically normal coronary arteries and normal left ventricular size and normal systolic function. There was no evidence of aortic stenosis or mitral regurgitation. A noncardiac etiology was suspected at that time and no further cardiac work-up was indicated.   He presents back to [facility] today with complaints of recurrent chest discomfort/left shoulder pain, with associated nausea, abdominal discomfort and diarrhea. He initially presented to [place]. EKG revealed ST elevations of 1-2 mm in lead 1, and aVL. Inverted T waves in lead 3. EKG abnormalities were felt to be consistent with acute lateral wall MI and Code STEMI was initially activated and patient was transported urgently to [facility].  However, records and prior EKGs were reviewed both by Dr. Terrence Dupont, on-call cardiologist for [facility] ER, as well as Dr. Burt Knack, on-call STEMI MD. Based on the fact that his EKG was essentially unchanged compared to prior EKGs in 2011 and 2014 and normal w/u less than 1 year ago, the Code STEMI was canceled.  Further w/u is pending.   Thus far, lab troponin is negative x 1. CMP reveals hypokalemia with K of 3.2. CBC is unremarkable. Physical exam is fairly unremarkable with the exception of mild periumbilical tenderness with palpation. His chest/shoulder pain is not reproducible with palpation.  On interview, he reports being in his usual state of health until Thursday evening while he was exercising at the gym. He was engaging in his usual physical activity routine. He was jogging around the track and felt left shoulder discomfort, prompting him to discontinue his workout. The discomfort in his left shoulder resolved with rest. The following day, on Friday morning, he woke up with recurrent severe anterior shoulder pain radiating to the bilateral neck as well as some radiation to his chest. Described as an achy sensation with a sharp component. Also with intermittent bilateral upper extremity numbness and tingling.  He denies any strenuous upper body resistance training as well as any injury or trauma. He had associated nausea and diarrhea but no vomiting. He also felt diaphoretic. No associated dyspnea. There were no exacerbating factors in regards to his chest/shoulder discomfort. He does note that he took one sublingual nitroglycerin tablet that relieved his symptoms, although did not completely resolve his pain. He denies any sick contacts.  His symptoms persisted off and on throughout the day. The following day, on Saturday, his symptoms improved. However, this morning he had recurrent symptoms prompting him to present to [place].   Currently in the ER, he continues to have mild discomfort, although slowly improving after administration of morphine.   Past Medical History  Diagnosis Date  .  COPD (chronic obstructive pulmonary disease)   . Enlarged prostate   . Sleep apnea   . Hypertension   . Chest pain     a. 11/2002 Cath: EF 68%, LM nl, LAD small, nl, LCX nl, RCA tortuous/nl;  b.  08/2011 St Echo: EF 60%, normal contractility, no scar/ischemia.  . Sickle cell trait     Past Surgical History  Procedure Laterality Date  . Tonsillectomy      Family History  Problem Relation Age of Onset  . CAD Mother     died in her 41's - MI  . Multiple myeloma Brother   . Sickle cell anemia Father     died @ 41   Social History:  reports that he has never smoked. He does not have any smokeless tobacco history on file. He reports that he does not drink alcohol or use illicit drugs.  Allergies: No Known Allergies   (Not in a hospital admission)  Results for orders placed during the hospital encounter of 12/05/13 (from the past 48 hour(s))  CBC WITH DIFFERENTIAL     Status: Abnormal   Collection Time    12/05/13  8:45 AM      Result Value Ref Range   WBC 5.8  4.0 - 10.5 K/uL   RBC 4.92  4.22 - 5.81 MIL/uL   Hemoglobin 14.3  13.0 - 17.0 g/dL   HCT 41.7  39.0 - 52.0 %   MCV 84.8  78.0 - 100.0 fL   MCH 29.1  26.0 - 34.0 pg   MCHC 34.3  30.0 - 36.0 g/dL   RDW 12.8  11.5 - 15.5 %   Platelets 194  150 - 400 K/uL   Neutrophils Relative % 49  43 - 77 %   Neutro Abs 2.9  1.7 - 7.7 K/uL   Lymphocytes Relative 34  12 - 46 %   Lymphs Abs 2.0  0.7 - 4.0 K/uL   Monocytes Relative 13 (*) 3 - 12 %   Monocytes Absolute 0.7  0.1 - 1.0 K/uL   Eosinophils Relative 4  0 - 5 %   Eosinophils Absolute 0.3  0.0 - 0.7 K/uL   Basophils Relative 0  0 - 1 %   Basophils Absolute 0.0  0.0 - 0.1 K/uL  COMPREHENSIVE METABOLIC PANEL     Status: Abnormal   Collection Time    12/05/13  8:45 AM      Result Value Ref Range   Sodium 145  137 - 147 mEq/L   Potassium 3.2 (*) 3.7 - 5.3 mEq/L   Chloride 103  96 - 112 mEq/L   CO2 29  19 - 32 mEq/L   Glucose, Bld 101 (*) 70 - 99 mg/dL   BUN 10  6 - 23 mg/dL   Creatinine, Ser 1.30  0.50 - 1.35 mg/dL   Calcium 9.9  8.4 - 10.5 mg/dL   Total Protein 7.9  6.0 - 8.3 g/dL   Albumin 4.1  3.5 - 5.2 g/dL   AST 30  0 - 37 U/L   ALT 22  0 - 53 U/L    Alkaline Phosphatase 75  39 - 117 U/L   Total Bilirubin 0.7  0.3 - 1.2 mg/dL   GFR calc non Af Amer 61 (*) >90 mL/min   GFR calc Af Amer 70 (*) >90 mL/min   Comment: (NOTE)     The eGFR has been calculated using the CKD EPI equation.     This calculation has  not been validated in all clinical situations.     eGFR's persistently <90 mL/min signify possible Chronic Kidney     Disease.   Anion gap 13  5 - 15  TROPONIN I     Status: None   Collection Time    12/05/13  8:45 AM      Result Value Ref Range   Troponin I <0.30  <0.30 ng/mL   Comment:            Due to the release kinetics of cTnI,     a negative result within the first hours     of the onset of symptoms does not rule out     myocardial infarction with certainty.     If myocardial infarction is still suspected,     repeat the test at appropriate intervals.  PROTIME-INR     Status: None   Collection Time    12/05/13  8:45 AM      Result Value Ref Range   Prothrombin Time 12.9  11.6 - 15.2 seconds   INR 0.97  0.00 - 1.49  APTT     Status: None   Collection Time    12/05/13  8:45 AM      Result Value Ref Range   aPTT 32  24 - 37 seconds   No results found.  Review of Systems  Constitutional: Positive for diaphoresis.  Respiratory: Negative for shortness of breath.   Cardiovascular: Positive for chest pain. Negative for leg swelling.  Gastrointestinal: Positive for nausea, abdominal pain and diarrhea. Negative for vomiting.  Musculoskeletal: Positive for joint pain and neck pain. Negative for falls.  Neurological: Positive for dizziness and tingling (bilateral upper extremities). Negative for loss of consciousness.  All other systems reviewed and are negative.   Blood pressure 146/95, pulse 67, temperature 97.4 F (36.3 C), temperature source Oral, resp. rate 11, height 6' (1.829 m), weight 183 lb (83.008 kg), SpO2 100.00%. Physical Exam  Constitutional: He is oriented to person, place, and time. He appears  well-developed and well-nourished. No distress.  Neck: Normal range of motion. No JVD present. No muscular tenderness present. Carotid bruit is not present. No rigidity. Normal range of motion present.  Cardiovascular: Normal rate, regular rhythm, normal heart sounds and intact distal pulses.  Exam reveals no gallop and no friction rub.   No murmur heard. Pulses:      Radial pulses are 2+ on the right side, and 2+ on the left side.       Dorsalis pedis pulses are 2+ on the right side, and 2+ on the left side.  Respiratory: Effort normal and breath sounds normal. No respiratory distress. He has no rales.  GI: Soft. Bowel sounds are normal. He exhibits no distension and no mass. There is tenderness (mild periumbilical). There is no rebound and no guarding.  Musculoskeletal: He exhibits no edema.  Neurological: He is alert and oriented to person, place, and time.  Skin: Skin is warm and dry. He is not diaphoretic.  Psychiatric: He has a normal mood and affect. His behavior is normal.     Assessment/Plan Active Problems:   Chest pain- atypical. Normal LHC 03/2013   HTN (hypertension)   Nonspecific abnormal electrocardiogram (ECG) (EKG)   Abdominal pain   Diarrhea   Hypokalemia  1. Chest/left upper extremity pain: Symptoms sound atypical. He had an extensive cardiac workup less than one year ago in December 2014 with a cardiac catheterization revealing angiographically normal coronaries and normal left  ventricular systolic function. EKG is abnormal however unchanged compared to prior EKGs from 2011 and 2014. Lab troponin negative x1. Will admit to observation. Will continue to cycle enzymes. Repeat 2D echo.  2. Abdominal pain with associated diarrhea: Labs are fairly unremarkable. He is afebrile and white count is within normal limits.   3. Hypokalemia: Potassium is 3.2. This is likely subsequent to problem #2 (diarrhea). He needs  repletion with supplemental K. Will order Kdur, 40 mEq. Will  check a Mg.   SIMMONS, BRITTAINY 12/05/2013, 11:12 AM

## 2013-12-05 NOTE — ED Provider Notes (Signed)
CSN: [id number]     Arrival date & time 12/05/13  0825 History   First MD Initiated Contact with Patient 12/05/13 [phone]     Chief Complaint  Patient presents with  . Chest Pain      HPI  Patient presents with shoulder neck pain and chest pain. He relates his symptoms from Friday night. He felt fatigued on Friday and was hurting in his left anterior chest and her left shoulder. He states he had some diarrhea and thought perhaps he had a bug. He is fatigued yesterday after supper going to the grocery store and unable to walk last night was limited by pain. He came home, took nitroglycerin, his pain went away, and he went to sleep. He waking this morning at 6:30. He got up and some to walk to the bathroom. He felt sweaty dizzy and pain in his left neck and left shoulder and left chest. He presents here.  History of hypertension. No history of diabetes. Mother has sudden cardiac death. He is a lifetime nonsmoker.  He states he has had evaluation for his heart in the past. She cannot relate specific testing. This was done at [facility]. He states he "doesn't think so when asked if he had stents. No cardiac surgeries.  Past Medical History  Diagnosis Date  . COPD (chronic obstructive pulmonary disease)   . Enlarged prostate   . Sleep apnea   . Hypertension   . Chest pain     a. 11/2002 Cath: EF 68%, LM nl, LAD small, nl, LCX nl, RCA tortuous/nl;  b. 08/2011 St Echo: EF 60%, normal contractility, no scar/ischemia.  . Sickle cell trait    Past Surgical History  Procedure Laterality Date  . Tonsillectomy     Family History  Problem Relation Age of Onset  . CAD Mother     died in her 37's - MI  . Multiple myeloma Brother   . Sickle cell anemia Father     died @ 53   History  Substance Use Topics  . Smoking status: Never Smoker   . Smokeless tobacco: Not on file  . Alcohol Use: No    Review of Systems  Constitutional: Negative for fever, chills, diaphoresis, appetite change and  fatigue.  HENT: Negative for mouth sores, sore throat and trouble swallowing.   Eyes: Negative for visual disturbance.  Respiratory: Negative for cough, chest tightness, shortness of breath and wheezing.   Cardiovascular: Positive for chest pain.  Gastrointestinal: Positive for diarrhea. Negative for nausea, vomiting, abdominal pain and abdominal distention.  Endocrine: Negative for polydipsia, polyphagia and polyuria.  Genitourinary: Negative for dysuria, frequency and hematuria.  Musculoskeletal: Positive for neck pain. Negative for gait problem.       Shoulder pain  Skin: Negative for color change, pallor and rash.  Neurological: Negative for dizziness, syncope, light-headedness and headaches.  Hematological: Does not bruise/bleed easily.  Psychiatric/Behavioral: Negative for behavioral problems and confusion.      Allergies  Review of patient's allergies indicates no known allergies.  Home Medications   Prior to Admission medications   Medication Sig Start Date End Date Taking? Authorizing Provider  diltiazem (DILACOR XR) 240 MG 24 hr capsule Take 240 mg by mouth daily.    Historical Provider, MD  ibuprofen (ADVIL,MOTRIN) 800 MG tablet Take 800 mg by mouth every 8 (eight) hours as needed.    Historical Provider, MD  nitroGLYCERIN (NITROSTAT) 0.4 MG SL tablet Place 1 tablet (0.4 mg total) under the tongue every  5 (five) minutes as needed for chest pain. 03/12/13   Thurnell Lose, MD  omeprazole (PRILOSEC) 10 MG capsule Take 40 mg by mouth daily.    Historical Provider, MD  oxyCODONE (OXY IR/ROXICODONE) 5 MG immediate release tablet Take 1 tablet (5 mg total) by mouth every 6 (six) hours as needed for moderate pain or severe pain. 03/12/13   Thurnell Lose, MD  tamsulosin (FLOMAX) 0.4 MG CAPS capsule Take 0.4 mg by mouth daily.    Historical Provider, MD  tiotropium (SPIRIVA) 18 MCG inhalation capsule Place 18 mcg into inhaler and inhale daily.    Historical Provider, MD   triamterene-hydrochlorothiazide (DYAZIDE) 37.5-25 MG per capsule Take 1 capsule by mouth daily.    Historical Provider, MD   BP 148/98  Pulse 63  Temp(Src) 97.9 F (36.6 C) (Oral)  Resp 13  Ht 6' (1.829 m)  Wt 183 lb (83.008 kg)  BMI 24.81 kg/m2  SpO2 100% Physical Exam  Constitutional: He is oriented to person, place, and time. He appears well-developed and well-nourished. No distress.  HENT:  Head: Normocephalic.  Eyes: Conjunctivae are normal. Pupils are equal, round, and reactive to light. No scleral icterus.  Neck: Normal range of motion. Neck supple. No thyromegaly present.  Cardiovascular: Normal rate and regular rhythm.  Exam reveals no gallop and no friction rub.   No murmur heard. Pulmonary/Chest: Effort normal and breath sounds normal. No respiratory distress. He has no wheezes. He has no rales.  Abdominal: Soft. Bowel sounds are normal. He exhibits no distension. There is no tenderness. There is no rebound.  Musculoskeletal: Normal range of motion.  Neurological: He is alert and oriented to person, place, and time.  Skin: Skin is warm and dry. No rash noted.  Psychiatric: He has a normal mood and affect. His behavior is normal.    ED Course  Procedures (including critical care time) Labs Review Labs Reviewed  CBC WITH DIFFERENTIAL - Abnormal; Notable for the following:    Monocytes Relative 13 (*)    All other components within normal limits  PROTIME-INR  APTT  COMPREHENSIVE METABOLIC PANEL  TROPONIN I    Imaging Review No results found.  EKG: \Sinus rhythm.  ST elevations of 1-2 mm in lead 1, and aVL. Inverted T waves in lead 3. Changes consistent with acute lateral wall MI. No obvious reciprocal changes other than inferiorly. Anterior ST segments are isoelectric.   MDM   Final diagnoses:  ST elevation myocardial infarction (STEMI), unspecified artery    Patient presents with chest pain and diaphoresis. He has ST elevations on his EKG. Upon his  arrival and upon seeing these changes I immediately initiated a "code STEMI".  Scar was immediately returned to me by Dr. Terrence Dupont.  He immediately sent to the patient in transfer. Patient at my request was given heparin bolus and drip and started on nitroglycerin drip. Is also given 4 baby aspirin by mouth.  I left his room, and went to review his old records. At the same time Dr. Terrence Dupont called me back. Mr. Vanauken had similar presentation with ST elevations in a normal angiogram in December of 2014 this was done by Dr. Tempie Hoist of [facility] cardiology. I then spoke with Dr. Sherren Mocha on-call for [facility]  Dr. Burt Knack was helpful as always, and returned  my call immediately.  We discussed the patient and his similar previous presentation. By that time the patient was already in her legs care and in route towards [place]. Patient  will be seen in the emergency room by [facility] cardiology. The code STEMI was canceled pending further evaluation and enzyme result.  I discuss the patient's evaluation in situation with Dr.Nanavati, my partner in the emergency room at [facility] hospital.    Tanna Furry, MD 12/05/13 [phone]

## 2013-12-05 NOTE — ED Notes (Signed)
Report unable to be given due to cath lab staff still in transport to hospital. Report given to EMS  and EDP reported to cardiologist,.

## 2013-12-05 NOTE — H&P (Addendum)
The patient was seen and examined, and I agree with the assessment and plan as documented above, with modifications as noted below. Pt with multiple hospitalizations for chest pain, most recently in 03/2013. Underwent coronary angiography at that time and echocardiography. Coronaries were normal as were LV systolic function and regional wall motion. ECG's reviewed by Drs. Cooper, Edisto, and myself, and continue to demonstrate high lateral ST elevation with nonspecific ST depression in V5-V6, with ST depressions and TWI inferiorly (8:42 ECG). He says "I do not feel well" complaining of left shoulder pain radiating into left precordium and neck. No shortness of breath. Initial symptoms noted after exercising where he experienced marked fatigue and left-sided chest pain. Has had abdominal discomfort, nausea, fatigue, diarrhea, and chills. 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 First serum troponin is normal. K low at 3.2 in context of diuretic use and diarrhea.  When he took a SL nitro at home, his chest discomfort eased up. He gets chest pain about once a week but does not routinely take nitro. Physical exam demonstrated no RUQ tenderness, mild epigastric tenderness, normal cardiovascular exam, and clear lungs. While this may be another benign presentation, it concerns me that he describes marked fatigue particularly with exertion and associated chest pain. He may have a viral illness as well (gastroenteritis). He had no documented diagonal disease by cath. It is possible this represents vasospastic angina, although he is on calcium channel blockers. Will check an echocardiogram for sake of thoroughness to make certain LV systolic function and regional wall motion are normal. Continue to cycle troponins. Replete K. I will start Imdur 30 mg daily and d/c nitro drip as well.

## 2013-12-05 NOTE — ED Notes (Signed)
Cardiology MD at bedside.

## 2013-12-05 NOTE — ED Notes (Signed)
To cath lab NOW by [facility].

## 2013-12-05 NOTE — ED Notes (Signed)
States that on Friday he was having shoulder and neck pain, then developed diarrhea. Slept most of the day and felt fatigued. Yesterday left to go grocery shopping, came home and was feeling tired again. Rested for the rest of the day, went on a neighborhood walk last night. Came home and was having shoulder pain and chest pain. Left shoulder and left chest pain that started again last night and continues.

## 2013-12-06 ENCOUNTER — Observation Stay (HOSPITAL_COMMUNITY): Payer: Medicare PPO

## 2013-12-06 ENCOUNTER — Encounter (HOSPITAL_COMMUNITY): Payer: Self-pay | Admitting: Radiology

## 2013-12-06 DIAGNOSIS — M25519 Pain in unspecified shoulder: Secondary | ICD-10-CM | POA: Diagnosis not present

## 2013-12-06 DIAGNOSIS — R0789 Other chest pain: Secondary | ICD-10-CM

## 2013-12-06 DIAGNOSIS — R079 Chest pain, unspecified: Secondary | ICD-10-CM | POA: Diagnosis not present

## 2013-12-06 DIAGNOSIS — I369 Nonrheumatic tricuspid valve disorder, unspecified: Secondary | ICD-10-CM

## 2013-12-06 DIAGNOSIS — I219 Acute myocardial infarction, unspecified: Secondary | ICD-10-CM | POA: Diagnosis not present

## 2013-12-06 DIAGNOSIS — M542 Cervicalgia: Secondary | ICD-10-CM | POA: Diagnosis not present

## 2013-12-06 LAB — CBC
HCT: 38 % — ABNORMAL LOW (ref 39.0–52.0)
Hemoglobin: 12.8 g/dL — ABNORMAL LOW (ref 13.0–17.0)
MCH: 28.4 pg (ref 26.0–34.0)
MCHC: 33.7 g/dL (ref 30.0–36.0)
MCV: 84.4 fL (ref 78.0–100.0)
Platelets: 185 10*3/uL (ref 150–400)
RBC: 4.5 MIL/uL (ref 4.22–5.81)
RDW: 12.7 % (ref 11.5–15.5)
WBC: 6.6 10*3/uL (ref 4.0–10.5)

## 2013-12-06 LAB — BASIC METABOLIC PANEL
Anion gap: 11 (ref 5–15)
BUN: 11 mg/dL (ref 6–23)
CO2: 26 mEq/L (ref 19–32)
Calcium: 8.8 mg/dL (ref 8.4–10.5)
Chloride: 104 mEq/L (ref 96–112)
Creatinine, Ser: 1.23 mg/dL (ref 0.50–1.35)
GFR calc Af Amer: 75 mL/min — ABNORMAL LOW (ref >90)
GFR calc non Af Amer: 65 mL/min — ABNORMAL LOW (ref >90)
Glucose, Bld: 104 mg/dL — ABNORMAL HIGH (ref 70–99)
Potassium: 3.4 mEq/L — ABNORMAL LOW (ref 3.7–5.3)
Sodium: 141 mEq/L (ref 137–147)

## 2013-12-06 LAB — GLUCOSE, CAPILLARY: Glucose-Capillary: 239 mg/dL — ABNORMAL HIGH (ref 70–99)

## 2013-12-06 MED ORDER — ALUM & MAG HYDROXIDE-SIMETH 200-200-20 MG/5ML PO SUSP
30.0000 mL | ORAL | Status: DC | PRN
  Administered 2013-12-06: 30 mL via ORAL
  Filled 2013-12-06: qty 30

## 2013-12-06 MED ORDER — SODIUM CHLORIDE 0.9 % IV BOLUS (SEPSIS)
250.0000 mL | Freq: Once | INTRAVENOUS | Status: AC
  Administered 2013-12-06: 250 mL via INTRAVENOUS

## 2013-12-06 MED ORDER — IOHEXOL 350 MG/ML SOLN
100.0000 mL | Freq: Once | INTRAVENOUS | Status: AC | PRN
  Administered 2013-12-06: 100 mL via INTRAVENOUS

## 2013-12-06 MED ORDER — OXYCODONE HCL 5 MG PO TABS
5.0000 mg | ORAL_TABLET | Freq: Four times a day (QID) | ORAL | Status: DC | PRN
  Administered 2013-12-06: 5 mg via ORAL
  Filled 2013-12-06: qty 1

## 2013-12-06 NOTE — Progress Notes (Signed)
Patient transferred to CT for scan, then [facility] room 10 with belongings, medications and chart. [name]

## 2013-12-06 NOTE — Progress Notes (Addendum)
Called by Ebony Hail, RN  for a "second set of eyes" for  this pt who c/o left sided, achy chest pain onset 2130 hrs, rated 6/10, non radiating. Denies diaphoresis, nausea, vomiting, declines offer of NTG or oxygen.  Billat BS clear, EKG obtained without changes from prior EKG.  Pt prefers to "wait it out-it usually just goes away after a while".  Will continue to follow as needed.  VSS:  98.5 127/82  64 18 100% RA  Hand off to [place], [place] F/U pt asleep per Ebony Hail, RN

## 2013-12-06 NOTE — Progress Notes (Signed)
Echocardiogram 2D Echocardiogram has been performed.  [name] 12/06/2013, 11:40 AM

## 2013-12-06 NOTE — Progress Notes (Signed)
UR completed 

## 2013-12-06 NOTE — Progress Notes (Signed)
Patient Name: [name] Date of Encounter: 12/06/2013    SUBJECTIVE: Diffuse set of complaints including neck discomfort, left shoulder discomfort, radiating pain into the left arm, and chest tightness. These complaints are cyclical. The chest discomfort can last minutes. The neck and arm discomfort can last considerably longer. There no precipitants. Prior to this admission the patient has also developed profound weakness, recurring nausea, diarrhea, and some dry heaves.  He currently complains of having significant neck and shoulder discomfort. He is able to move without exacerbating the discomfort. She complains of feeling weak.  TELEMETRY:  Normal sinus rhythm Filed Vitals:   12/06/13 0900 12/06/13 0906 12/06/13 1000 12/06/13 1016  BP: 107/65   118/91  Pulse: 55  56 57  Temp:      TempSrc:      Resp: 13  12 10   Height:      Weight:      SpO2: 95% 100% 100% 99%    Intake/Output Summary (Last 24 hours) at 12/06/13 1043 Last data filed at 12/06/13 0800  Gross per 24 hour  Intake    455 ml  Output   1225 ml  Net   -770 ml   LABS: Basic Metabolic Panel:  Recent Labs  12/05/13 0845 12/05/13 1558 12/06/13 0215  NA 145  --  141  K 3.2*  --  3.4*  CL 103  --  104  CO2 29  --  26  GLUCOSE 101*  --  104*  BUN 10  --  11  CREATININE 1.30  --  1.23  CALCIUM 9.9  --  8.8  MG  --  2.0  --    CBC:  Recent Labs  12/05/13 0845 12/06/13 0215  WBC 5.8 6.6  NEUTROABS 2.9  --   HGB 14.3 12.8*  HCT 41.7 38.0*  MCV 84.8 84.4  PLT 194 185   Cardiac Enzymes:  Recent Labs  12/05/13 0845 12/05/13 2022  TROPONINI <0.30 <0.30   ECG: ST elevation one in aVL with reciprocal changes. ECG is unchanged from prior tracings back to 2011.  Radiology/Studies:  Chest x-ray reveals no active cardiopulmonary disease  Physical Exam: Blood pressure 118/91, pulse 57, temperature 98.1 F (36.7 C), temperature source Oral, resp. rate 10, height 6' (1.829 m), weight  183 lb (83.008 kg), SpO2 99.00%. Weight change:   Wt Readings from Last 3 Encounters:  12/05/13 183 lb (83.008 kg)  03/11/13 195 lb 4.8 oz (88.587 kg)  03/11/13 195 lb 4.8 oz (88.587 kg)    Chest is clear Cardiac exam is normal Exam reveals no triggerpoints, swelling, or tenderness Extremities reveal no edema. Pulses are 2+ and symmetric in the upper and lower extremities.   ASSESSMENT:  1. Neck shoulder and arm discomfort, uncertain etiology but unlikely to be related to coronary artery disease/myocardial ischemia 2. Recurring brief episodes of squeezing chest discomfort. This complaint is similar to episodes of occurred intermittently over the past 12-24 months. Coronary angiography in December was unremarkable 3. History of chronic pain syndrome, poorly characterized 4. Nausea and diarrhea her prior to this admission  Plan:  1. Ambulate 2. Chest CT with contrast to rule out aortic disease 3. There is no indication for an ischemic evaluation under the current set of complaints 4. Patient is a candidate for discharge within the next 24 hours depending upon his course 5. May need to have an MRI of his neck if the neck and shoulder discomfort persist.  Signed,  Sinclair Grooms 12/06/2013, 10:43 AM

## 2013-12-07 DIAGNOSIS — R079 Chest pain, unspecified: Secondary | ICD-10-CM

## 2013-12-07 MED ORDER — NAPROXEN 500 MG PO TABS
500.0000 mg | ORAL_TABLET | Freq: Two times a day (BID) | ORAL | Status: DC
  Administered 2013-12-07 – 2013-12-08 (×2): 500 mg via ORAL
  Filled 2013-12-07 (×5): qty 1

## 2013-12-07 MED ORDER — AMLODIPINE BESYLATE 5 MG PO TABS
5.0000 mg | ORAL_TABLET | Freq: Every day | ORAL | Status: DC
  Administered 2013-12-07 – 2013-12-08 (×2): 5 mg via ORAL
  Filled 2013-12-07 (×2): qty 1

## 2013-12-07 MED ORDER — KETOROLAC TROMETHAMINE 30 MG/ML IJ SOLN
30.0000 mg | Freq: Once | INTRAMUSCULAR | Status: AC
  Administered 2013-12-07: 30 mg via INTRAVENOUS
  Filled 2013-12-07: qty 1

## 2013-12-07 NOTE — Progress Notes (Addendum)
Patient Profile: The patient is a 53 y/o male who has undergone extensive w/u in the past for chest pain. In December 2014, he presented to [facility] with chest pain. His EKG at that time showed lateral ST elevations (Note: he has had same findings on EKG since 02/2010). Cardiac enzymes at that time were normal. CXR and D-dimer were also normal. He ultimately underwent a diagnostic LHC, performed by Dr. Haroldine Laws, on 03/11/2013. This revealed angiographically normal coronary arteries and normal left ventricular size and normal systolic function. There was no evidence of aortic stenosis or mitral regurgitation. A noncardiac etiology was suspected at that time and no further cardiac work-up was indicated.   Presented back on 12/05/13 with similar complaints including neck discomfort, left shoulder discomfort, radiating pain into the left arm, and chest tightness. Also complained of nausea, abdominal discomfort and diarrhea. EKG unchanged with ST elevations.  Cardiac enzymes negative x 3. 2D echo demonstrates normal systolic function with EF 65-70%. No WMA. CT of chest negative for any aortic disease/ acute processes.     Subjective: Still with intermittent left sided chest discomfort. Feels like someone is "sqeezing" his heart.   Objective: Vital signs in last 24 hours: Temp:  [98.3 F (36.8 C)-98.5 F (36.9 C)] 98.3 F (36.8 C) (09/01 0457) Pulse Rate:  [50-89] 60 (09/01 0457) Resp:  [10-21] 18 (09/01 0457) BP: (105-127)/(62-91) 118/64 mmHg (09/01 0457) SpO2:  [92 %-100 %] 94 % (09/01 0750) Weight:  [181 lb 6.4 oz (82.283 kg)] 181 lb 6.4 oz (82.283 kg) (09/01 0457)    Intake/Output from previous day: 08/31 0701 - 09/01 0700 In: 200 [P.O.:200] Out: 540 [Urine:540] Intake/Output this shift: Total I/O In: 360 [P.O.:360] Out: -   Medications Current Facility-Administered Medications  Medication Dose Route Frequency Provider Last Rate Last Dose  . acetaminophen (TYLENOL) tablet 650 mg  650 mg  Oral Q4H PRN Brittainy Simmons, PA-C   650 mg at 12/06/13 1138  . alum & mag hydroxide-simeth (MAALOX/MYLANTA) 200-200-20 MG/5ML suspension 30 mL  30 mL Oral Q4H PRN Herminio Commons, MD   30 mL at 12/06/13 0132  . aspirin EC tablet 81 mg  81 mg Oral Daily Brittainy Simmons, PA-C   81 mg at 12/06/13 0911  . diltiazem (DILACOR XR) 24 hr capsule 240 mg  240 mg Oral Daily Brittainy Simmons, PA-C   240 mg at 12/06/13 0911  . isosorbide mononitrate (IMDUR) 24 hr tablet 30 mg  30 mg Oral Daily Herminio Commons, MD   30 mg at 12/06/13 0913  . morphine 2 MG/ML injection 1-2 mg  1-2 mg Intravenous Q4H PRN Herminio Commons, MD   2 mg at 12/06/13 1308  . nitroGLYCERIN (NITROSTAT) SL tablet 0.4 mg  0.4 mg Sublingual Q5 Min x 3 PRN Brittainy Simmons, PA-C   0.4 mg at 12/05/13 2039  . ondansetron (ZOFRAN) injection 4 mg  4 mg Intravenous Q6H PRN Brittainy Simmons, PA-C   4 mg at 12/06/13 1022  . oxyCODONE (Oxy IR/ROXICODONE) immediate release tablet 5 mg  5 mg Oral Q6H PRN Eileen Stanford, PA-C   5 mg at 12/06/13 1453  . pantoprazole (PROTONIX) EC tablet 80 mg  80 mg Oral Daily Brittainy Simmons, PA-C   80 mg at 12/06/13 0915  . tamsulosin (FLOMAX) capsule 0.4 mg  0.4 mg Oral Daily Brittainy Simmons, PA-C   0.4 mg at 12/06/13 0911  . tiotropium (SPIRIVA) inhalation capsule 18 mcg  18 mcg Inhalation Daily Lyda Jester, PA-C  18 mcg at 12/07/13 0749  . triamterene-hydrochlorothiazide (DYAZIDE) 37.5-25 MG per capsule 1 capsule  1 capsule Oral Daily Brittainy Simmons, PA-C   1 capsule at 12/06/13 0911    PE: General appearance: alert, cooperative and no distress Neck: no carotid bruit and no JVD Lungs: clear to auscultation bilaterally Heart: regular rate and rhythm Extremities: no LEE Pulses: 2+ and symmetric Skin: warm and dry Neurologic: Grossly normal  Lab Results:   Recent Labs  12/05/13 0845 12/06/13 0215  WBC 5.8 6.6  HGB 14.3 12.8*  HCT 41.7 38.0*  PLT 194 185    BMET  Recent Labs  12/05/13 0845 12/06/13 0215  NA 145 141  K 3.2* 3.4*  CL 103 104  CO2 29 26  GLUCOSE 101* 104*  BUN 10 11  CREATININE 1.30 1.23  CALCIUM 9.9 8.8   PT/INR  Recent Labs  12/05/13 0845  LABPROT 12.9  INR 0.97     Assessment/Plan  Active Problems:   Chest pain- atypical. Normal LHC 03/2013   HTN (hypertension)   Nonspecific abnormal electrocardiogram (ECG) (EKG)   Abdominal pain   Diarrhea   Hypokalemia  1. Chest Pain: W/u continues to be unremarkable. However patient still with complaints of intermittent chest discomfort described as someone "squeezing" his heart. ? Prinzmetal's angina. He is already on a CCB (diltiazem) and a nitrate (Imdur). ? If amlodipine would be a better option than diltiazem.  2. Neck/Shoulder/Arm discomfort: recommend OP MRI of his neck if discomfort persists.   3. Hypokalemia: K was low yesterday at 3.4. Per MAR, this was not repleted as he did not receive any supplemental K. Will give 40 mEq today.   4. Nausea/diarrhea/abdominal discomfort: resolved  5. HTN: stable   LOS: 2 days    Brittainy M. Ladoris Gene 12/07/2013 8:53 AM   Patient seen and examined. Agree with assessment and plan. Continues to experince atypical chest and left arm discomfort. Suspect musculoskeletal in etiology. In the past can persist for days or weeks. CT as above. Will change to amlodipine from cardizem. Will give trial of NSAIDx. Keep today and plan dc tomorrow with consideration of MRI to neck if discomfort persists.   [name] Sine, MD, [facility] 12/07/2013 9:55 AM

## 2013-12-07 NOTE — Progress Notes (Signed)
UR completed 

## 2013-12-08 ENCOUNTER — Other Ambulatory Visit (HOSPITAL_COMMUNITY): Payer: Self-pay | Admitting: Physician Assistant

## 2013-12-08 DIAGNOSIS — I219 Acute myocardial infarction, unspecified: Secondary | ICD-10-CM | POA: Diagnosis not present

## 2013-12-08 DIAGNOSIS — R079 Chest pain, unspecified: Secondary | ICD-10-CM | POA: Diagnosis not present

## 2013-12-08 DIAGNOSIS — M542 Cervicalgia: Secondary | ICD-10-CM | POA: Diagnosis not present

## 2013-12-08 DIAGNOSIS — M25519 Pain in unspecified shoulder: Secondary | ICD-10-CM | POA: Diagnosis not present

## 2013-12-08 LAB — SEDIMENTATION RATE: Sed Rate: 7 mm/hr (ref 0–16)

## 2013-12-08 LAB — BASIC METABOLIC PANEL
Anion gap: 9 (ref 5–15)
BUN: 15 mg/dL (ref 6–23)
CO2: 31 mEq/L (ref 19–32)
Calcium: 9 mg/dL (ref 8.4–10.5)
Chloride: 103 mEq/L (ref 96–112)
Creatinine, Ser: 1.49 mg/dL — ABNORMAL HIGH (ref 0.50–1.35)
GFR calc Af Amer: 60 mL/min — ABNORMAL LOW (ref >90)
GFR calc non Af Amer: 52 mL/min — ABNORMAL LOW (ref >90)
Glucose, Bld: 86 mg/dL (ref 70–99)
Potassium: 3.6 mEq/L — ABNORMAL LOW (ref 3.7–5.3)
Sodium: 143 mEq/L (ref 137–147)

## 2013-12-08 MED ORDER — NAPROXEN 375 MG PO TABS
375.0000 mg | ORAL_TABLET | Freq: Two times a day (BID) | ORAL | Status: DC
Start: 2013-12-08 — End: 2017-04-05

## 2013-12-08 MED ORDER — TRIAMTERENE-HCTZ 37.5-25 MG PO CAPS: 1.0000 | ORAL_CAPSULE | Freq: Every day | ORAL | Status: DC

## 2013-12-08 MED ORDER — AMLODIPINE BESYLATE 5 MG PO TABS: 5.0000 mg | ORAL_TABLET | Freq: Every day | ORAL | Status: DC

## 2013-12-08 MED ORDER — ISOSORBIDE MONONITRATE ER 30 MG PO TB24: 30.0000 mg | ORAL_TABLET | Freq: Every day | ORAL | Status: DC

## 2013-12-08 NOTE — Discharge Summary (Signed)
CARDIOLOGY DISCHARGE SUMMARY   Patient ID: [name] MRN: [id number] DOB/AGE: 1960/11/12 53 y.o.  Admit date: 12/05/2013 Discharge date: 12/08/2013  PCP: Doree Fudge, MD Primary Cardiologist: Dr. Domenic Polite  Primary Discharge Diagnosis: Chest pain- atypical. Normal LHC 03/2013, on rx for PrinzMetal angina  Secondary Discharge Diagnosis:    HTN (hypertension)   Nonspecific abnormal electrocardiogram (ECG) (EKG)   Abdominal pain   Diarrhea   Hypokalemia  Procedures: 2 D Echocardiogram, CTA chest   Hospital Course: [name] is a 53 y.o. male with a history of chest pain and an abnormal ECG, s/p clean cath 03/2013 and treated for PrinzMetal's angina.    He had recurrent chest pain and came to the hospital, where he was admitted for further evaluation and treatment.   His pain improved with morphine as well as sublingual nitroglycerin. His cardiac enzymes were negative for MI. His chest pain was associated with left shoulder pain as well as some nausea, abdominal discomfort and diarrhea. His potassium was low on admission at 3.2 was supplemented. The magnesium level was checked but was within normal limits. The CBC did not show any acute infection. The nausea and diarrhea resolved and did not return. His ECG was abnormal at baseline, but unchanged from a previous ECG.  He had some symptoms consistent with musculoskeletal discomfort and was started on Naprosyn, once he ruled out for MI. He also received IV Toradol.  A CT angiogram of the chest was performed to further evaluate his pain. The results are below, but there were no acute abnormalities. He was started on Imdur for possible Prinzmetal's angina. He had been on Cardizem but this was changed to amlodipine. His creatinine increased slightly during admission, his diuretic was held and he is to hold this for the next 5 days. He is encouraged to followup with his primary care physician and get repeat labs next  week.  He ambulated with nursing staff on the new medication regimen. Initially, he got some chest pain with exertion, but it improved as he continued to ambulate. An ECG done at the time showed no acute changes. As his symptoms were improving and a previous cath had been clean, no further inpatient workup is indicated. He was evaluated by Dr. Claiborne Billings and considered stable for discharge, to follow up as an outpatient. Of note, the patient expressed a wish to followup with Dr. Einar Gip. His daughter had spoken with their office once already. [name] was advised that he should call Dr. Irven Shelling office for an appointment, and followup with [place] as needed.  Labs:  Lab Results  Component Value Date   WBC 6.6 12/06/2013   HGB 12.8* 12/06/2013   HCT 38.0* 12/06/2013   MCV 84.4 12/06/2013   PLT 185 12/06/2013    Recent Labs Lab 12/05/13 0845  12/08/13 0406  NA 145  < > 143  K 3.2*  < > 3.6*  CL 103  < > 103  CO2 29  < > 31  BUN 10  < > 15  CREATININE 1.30  < > 1.49*  CALCIUM 9.9  < > 9.0  PROT 7.9  --   --   BILITOT 0.7  --   --   ALKPHOS 75  --   --   ALT 22  --   --   AST 30  --   --   GLUCOSE 101*  < > 86  < > = values in this interval not displayed.  Recent  Labs  12/05/13 2022  TROPONINI <0.30     Radiology: Ct Angio Chest Aortic Dissect W &/or W/o 12/06/2013   CLINICAL DATA:  Left arm and left shoulder pain  EXAM: CT ANGIOGRAPHY CHEST WITH CONTRAST  TECHNIQUE: Multidetector CT imaging of the chest was performed using the standard protocol during bolus administration of intravenous contrast. Multiplanar CT image reconstructions and MIPs were obtained to evaluate the vascular anatomy.  CONTRAST:  146mL OMNIPAQUE IOHEXOL 350 MG/ML SOLN  COMPARISON:  09/07/2013  FINDINGS: Lungs are clear. No pleural or pericardial effusion. No significant hilar or mediastinal adenopathy. Thoracic inlet normal. There are a few tiny left thyroid nodules all well below cm in size.  Thoracic aorta appears normal  with no calcification, dissection, or dilatation. Central pulmonary arteries also appear normal.  Images to the upper abdomen show no acute abnormalities. There is diverticulosis throughout the visualized portions of the colon.  There are no acute musculoskeletal findings.  Review of the MIP images confirms the above findings.  IMPRESSION: No acute abnormalities   Electronically Signed   By: Skipper Cliche M.D.   On: 12/06/2013 13:54   EKG: 12/08/2013 SR, no acute ischemic changes Vent. rate 65 BPM PR interval 204 ms QRS duration 128 ms QT/QTc 424/440 ms P-R-T axes 59 -71 13  Echo:12/06/2013 Study Conclusions - Left ventricle: The cavity size was normal. There was mild concentric hypertrophy. Systolic function was vigorous. The estimated ejection fraction was in the range of 65% to 70%. Wall motion was normal; there were no regional wall motion abnormalities. Left ventricular diastolic function parameters were normal. - Aortic valve: Trileaflet; normal thickness leaflets. There was no regurgitation. - Mitral valve: There was no regurgitation. - Left atrium: The atrium was normal in size. - Right atrium: The atrium was normal in size. - Tricuspid valve: There was mild regurgitation. - Pulmonic valve: There was no regurgitation. - Pulmonary arteries: Systolic pressure was within the normal range. - Pericardium, extracardiac: There was no pericardial effusion. Impressions: - Normal biventricular size and function. Normal filling pressures. No regional wall motion abnormalities. Mild tricuspid regurgitation. Normal RVSP.   FOLLOW UP PLANS AND APPOINTMENTS No Known Allergies   Medication List    STOP taking these medications       diltiazem 240 MG 24 hr capsule  Commonly known as:  DILACOR XR     ibuprofen 800 MG tablet  Commonly known as:  ADVIL,MOTRIN      TAKE these medications       amLODipine 5 MG tablet  Commonly known as:  NORVASC  Take 1 tablet (5 mg total) by  mouth daily.     isosorbide mononitrate 30 MG 24 hr tablet  Commonly known as:  IMDUR  Take 1 tablet (30 mg total) by mouth daily.     naproxen 375 MG tablet  Commonly known as:  NAPROSYN  Take 1 tablet (375 mg total) by mouth 2 (two) times daily with a meal.     nitroGLYCERIN 0.4 MG SL tablet  Commonly known as:  NITROSTAT  Place 1 tablet (0.4 mg total) under the tongue every 5 (five) minutes as needed for chest pain.     omeprazole 40 MG capsule  Commonly known as:  PRILOSEC  Take 40 mg by mouth daily.     oxyCODONE 5 MG immediate release tablet  Commonly known as:  Oxy IR/ROXICODONE  Take 1 tablet (5 mg total) by mouth every 6 (six) hours as needed for moderate pain or severe  pain.     tamsulosin 0.4 MG Caps capsule  Commonly known as:  FLOMAX  Take 0.4 mg by mouth daily.     tiotropium 18 MCG inhalation capsule  Commonly known as:  SPIRIVA  Place 18 mcg into inhaler and inhale daily.     triamterene-hydrochlorothiazide 37.5-25 MG per capsule  Commonly known as:  DYAZIDE  Take 1 each (1 capsule total) by mouth daily.  Start taking on:  12/11/2013        Discharge Instructions   Diet - low sodium heart healthy    Complete by:  As directed      Increase activity slowly    Complete by:  As directed           Follow-up Information   Follow up with [facility]. ([phone], As needed)    Contact information:   [address] [place] [address]       Follow up with Laverda Page, MD. (As needed, If symptoms worsen)    Specialty:  Cardiology   Contact information:   [place] [place] [address] [phone]       Schedule an appointment as soon as possible for a visit with Doree Fudge, MD. (Lab work next week to check potassium and kidney function.)    Specialty:  Family Medicine   Contact information:   [address] [place] [address] [phone]        BRING ALL MEDICATIONS WITH YOU TO FOLLOW UP APPOINTMENTS  Time spent with patient to include physician time: 45 min Signed: Rosaria Ferries, PA-C 12/08/2013, 4:49 PM Co-Sign MD

## 2013-12-08 NOTE — Progress Notes (Signed)
Ambulated with pt in the hallway, pt complained of a squeezing ache in his left chest and upper left arm with a 4-5 pain rating. Elayne Guerin, [place] notified.  Will continue to monitor.

## 2013-12-08 NOTE — Progress Notes (Addendum)
Patient's discharge medications updated after patient discharged home.  Attempted to reach patient at his home number, line busy and unable to leave message.  Attempted daughter Dominque's number, line not in service.  Left message with Trapp,Patricia Daughter [phone] to have patient call back to the Unit.  Rosaria Ferries PA paged and notified.  Sanda Linger  Received phone call back from patient and daughter.  Patient updated to start taking Norvasc 5 mg daily and medication should be at the pharmacy for him to pick up.  Rosaria Ferries PA updated.  Sanda Linger

## 2013-12-08 NOTE — Progress Notes (Signed)
Patient Name: [name] Date of Encounter: 12/08/2013  Active Problems:   Chest pain- atypical. Normal LHC 03/2013   HTN (hypertension)   Nonspecific abnormal electrocardiogram (ECG) (EKG)   Abdominal pain   Diarrhea   Hypokalemia    Patient Profile: 53 yo male w/ hx clean cath 03/2013, rx for PrinzMetal's angina was admitted 08/30 with chest pain  SUBJECTIVE: No more chest pain, neck pain is improved. Has not ambulated yet today.  OBJECTIVE Filed Vitals:   12/07/13 1538 12/07/13 2012 12/08/13 0521 12/08/13 1002  BP: 106/68 111/70 121/82 134/78  Pulse: 55 54 54 62  Temp: 98.6 F (37 C) 98.1 F (36.7 C) 98 F (36.7 C) 98.4 F (36.9 C)  TempSrc: Oral Oral Oral Oral  Resp: 16 18 18 18   Height:      Weight:   181 lb 7 oz (82.3 kg)   SpO2: 99% 100% 100% 100%    Intake/Output Summary (Last 24 hours) at 12/08/13 1011 Last data filed at 12/07/13 1700  Gross per 24 hour  Intake    240 ml  Output      0 ml  Net    240 ml   Filed Weights   12/05/13 0830 12/07/13 0457 12/08/13 0521  Weight: 183 lb (83.008 kg) 181 lb 6.4 oz (82.283 kg) 181 lb 7 oz (82.3 kg)    PHYSICAL EXAM General: Well developed, well nourished, male in no acute distress. Head: Normocephalic, atraumatic.  Neck: Supple without bruits, JVD not elevated. Lungs:  Resp regular and unlabored, CTA. Heart: RRR, S1, S2, no S3, S4, or murmur; no rub. Abdomen: Soft, non-tender, non-distended, BS + x 4.  Extremities: No clubbing, cyanosis, no edema.  Neuro: Alert and oriented X 3. Moves all extremities spontaneously. Psych: Normal affect.  LABS: CBC: Recent Labs  12/06/13 0215  WBC 6.6  HGB 12.8*  HCT 38.0*  MCV 84.4  PLT 938   Basic Metabolic Panel: Recent Labs  12/05/13 1558 12/06/13 0215 12/08/13 0406  NA  --  141 143  K  --  3.4* 3.6*  CL  --  104 103  CO2  --  26 31  GLUCOSE  --  104* 86  BUN  --  11 15  CREATININE  --  1.23 1.49*  CALCIUM  --  8.8 9.0  MG 2.0  --   --     Cardiac Enzymes: Recent Labs  12/05/13 2022  TROPONINI <0.30   TELE:  SR, no significant ectopy  Radiology/Studies: Ct Angio Chest Aortic Dissect W &/or W/o 12/06/2013   CLINICAL DATA:  Left arm and left shoulder pain  EXAM: CT ANGIOGRAPHY CHEST WITH CONTRAST  TECHNIQUE: Multidetector CT imaging of the chest was performed using the standard protocol during bolus administration of intravenous contrast. Multiplanar CT image reconstructions and MIPs were obtained to evaluate the vascular anatomy.  CONTRAST:  125mL OMNIPAQUE IOHEXOL 350 MG/ML SOLN  COMPARISON:  09/07/2013  FINDINGS: Lungs are clear. No pleural or pericardial effusion. No significant hilar or mediastinal adenopathy. Thoracic inlet normal. There are a few tiny left thyroid nodules all well below cm in size.  Thoracic aorta appears normal with no calcification, dissection, or dilatation. Central pulmonary arteries also appear normal.  Images to the upper abdomen show no acute abnormalities. There is diverticulosis throughout the visualized portions of the colon.  There are no acute musculoskeletal findings.  Review of the MIP images confirms the above findings.  IMPRESSION: No acute abnormalities  Electronically Signed   By: Skipper Cliche M.D.   On: 12/06/2013 13:54     Current Medications:  . amLODipine  5 mg Oral Daily  . aspirin EC  81 mg Oral Daily  . isosorbide mononitrate  30 mg Oral Daily  . naproxen  500 mg Oral BID WC  . pantoprazole  80 mg Oral Daily  . tamsulosin  0.4 mg Oral Daily  . tiotropium  18 mcg Inhalation Daily  . triamterene-hydrochlorothiazide  1 capsule Oral Daily      ASSESSMENT AND PLAN: 1. Chest Pain: NL cors at cath 2014. W/u continues to be unremarkable. However patient still with complaints of intermittent chest discomfort described as someone "squeezing" his heart. ? Prinzmetal's angina. Continue nitrates (Imdur).  Cardizem changed to amlodipine 09/01. Ambulate and see how tolerated.   2.  Neck/Shoulder/Arm discomfort: recommend OP MRI of his neck if discomfort persists. Pt should f/u with primary MD  3. Hypokalemia: K was low 08/31 at 3.4. Rec'd 40 mEq, follow.   4. Nausea/diarrhea/abdominal discomfort: resolved   5. HTN: stable  Signed, Rosaria Ferries , PA-C 10:11 AM 12/08/2013   Patient seen and examined. Agree with assessment and plan. Feels better today with resolution of discomfort. Yesterday amlodipine was started in place of cardizem and he was given one dose of toradol and started on oral NAIDx. Cr 1.49 today increased from 1.23.  Will decrease naprosyn to 375 mg bid today; ? dc later today. No pleuritic symptoms   [name] Sine, MD, [facility] 12/08/2013 11:43 AM

## 2014-03-17 ENCOUNTER — Encounter (HOSPITAL_COMMUNITY): Payer: Self-pay | Admitting: Internal Medicine

## 2014-04-12 ENCOUNTER — Other Ambulatory Visit: Payer: Self-pay | Admitting: Physician Assistant

## 2014-04-13 ENCOUNTER — Other Ambulatory Visit: Payer: Self-pay | Admitting: Cardiology

## 2014-06-15 ENCOUNTER — Other Ambulatory Visit: Payer: Self-pay | Admitting: Cardiology

## 2014-12-13 ENCOUNTER — Other Ambulatory Visit: Payer: Self-pay | Admitting: Physician Assistant

## 2015-02-06 ENCOUNTER — Encounter (HOSPITAL_BASED_OUTPATIENT_CLINIC_OR_DEPARTMENT_OTHER): Payer: Self-pay | Admitting: *Deleted

## 2015-02-06 ENCOUNTER — Emergency Department (HOSPITAL_BASED_OUTPATIENT_CLINIC_OR_DEPARTMENT_OTHER)
Admission: EM | Admit: 2015-02-06 | Discharge: 2015-02-06 | Discharge status: Against Medical Advice | Payer: Medicare HMO | Attending: Emergency Medicine | Admitting: Emergency Medicine

## 2015-02-06 ENCOUNTER — Emergency Department (HOSPITAL_BASED_OUTPATIENT_CLINIC_OR_DEPARTMENT_OTHER): Payer: Medicare HMO

## 2015-02-06 DIAGNOSIS — Z87438 Personal history of other diseases of male genital organs: Secondary | ICD-10-CM | POA: Diagnosis not present

## 2015-02-06 DIAGNOSIS — Z862 Personal history of diseases of the blood and blood-forming organs and certain disorders involving the immune mechanism: Secondary | ICD-10-CM | POA: Insufficient documentation

## 2015-02-06 DIAGNOSIS — R0789 Other chest pain: Secondary | ICD-10-CM | POA: Insufficient documentation

## 2015-02-06 DIAGNOSIS — J449 Chronic obstructive pulmonary disease, unspecified: Secondary | ICD-10-CM | POA: Insufficient documentation

## 2015-02-06 DIAGNOSIS — R079 Chest pain, unspecified: Secondary | ICD-10-CM | POA: Diagnosis present

## 2015-02-06 DIAGNOSIS — I10 Essential (primary) hypertension: Secondary | ICD-10-CM | POA: Insufficient documentation

## 2015-02-06 DIAGNOSIS — Z79899 Other long term (current) drug therapy: Secondary | ICD-10-CM | POA: Insufficient documentation

## 2015-02-06 DIAGNOSIS — Z8669 Personal history of other diseases of the nervous system and sense organs: Secondary | ICD-10-CM | POA: Diagnosis not present

## 2015-02-06 LAB — CBC
HCT: 42.5 % (ref 39.0–52.0)
Hemoglobin: 14.4 g/dL (ref 13.0–17.0)
MCH: 28.6 pg (ref 26.0–34.0)
MCHC: 33.9 g/dL (ref 30.0–36.0)
MCV: 84.3 fL (ref 78.0–100.0)
Platelets: 214 10*3/uL (ref 150–400)
RBC: 5.04 MIL/uL (ref 4.22–5.81)
RDW: 12.5 % (ref 11.5–15.5)
WBC: 8 10*3/uL (ref 4.0–10.5)

## 2015-02-06 LAB — COMPREHENSIVE METABOLIC PANEL
ALT: 20 U/L (ref 17–63)
AST: 37 U/L (ref 15–41)
Albumin: 4.6 g/dL (ref 3.5–5.0)
Alkaline Phosphatase: 65 U/L (ref 38–126)
Anion gap: 6 (ref 5–15)
BUN: 14 mg/dL (ref 6–20)
CO2: 31 mmol/L (ref 22–32)
Calcium: 9.7 mg/dL (ref 8.9–10.3)
Chloride: 103 mmol/L (ref 101–111)
Creatinine, Ser: 1.4 mg/dL — ABNORMAL HIGH (ref 0.61–1.24)
GFR calc Af Amer: >60 mL/min (ref >60)
GFR calc non Af Amer: 55 mL/min — ABNORMAL LOW (ref >60)
Glucose, Bld: 130 mg/dL — ABNORMAL HIGH (ref 65–99)
Potassium: 3.7 mmol/L (ref 3.5–5.1)
Sodium: 140 mmol/L (ref 135–145)
Total Bilirubin: 0.8 mg/dL (ref 0.3–1.2)
Total Protein: 8.6 g/dL — ABNORMAL HIGH (ref 6.5–8.1)

## 2015-02-06 LAB — TROPONIN I
Troponin I: <0.03 ng/mL (ref <0.031)
Troponin I: <0.03 ng/mL (ref <0.031)

## 2015-02-06 MED ORDER — FENTANYL CITRATE (PF) 100 MCG/2ML IJ SOLN
100.0000 ug | Freq: Once | INTRAMUSCULAR | Status: AC
  Administered 2015-02-06: 100 ug via INTRAVENOUS
  Filled 2015-02-06: qty 2

## 2015-02-06 NOTE — ED Notes (Signed)
MD made aware of c/o CP

## 2015-02-06 NOTE — ED Provider Notes (Signed)
CSN: [id number]     Arrival date & time 02/06/15  1847 History   First MD Initiated Contact with Patient 02/06/15 1855     Chief Complaint  Patient presents with  . Chest Pain     (Consider location/radiation/quality/duration/timing/severity/associated sxs/prior Treatment) HPI CHRISOPHER PUSTEJOVSKY is a 54 y.o. male with a history of hypertension, COPD, atypical precordial chest pain, normal heart cath in 03/2013, comes in for evaluation of chest pain. Patient reports since Thursday he has had constant sharp left-sided chest pain that occurs when he breathes deeply. He reports associated nausea and intermittent sweatiness. He reports having taken some of his nitroglycerin with only mild relief of his symptoms at the time. He reports associated cough on Thursday and Friday morning, but has since resolved. He denies any fevers, chills, shortness of breath, leg swelling or pain, recent travel or surgeries, numbness or weakness. He reports he has not followed up with his cardiologist.  Past Medical History  Diagnosis Date  . COPD (chronic obstructive pulmonary disease) ([place])   . Enlarged prostate   . Sleep apnea   . Hypertension   . Chest pain     a. 11/2002 Cath: EF 68%, LM nl, LAD small, nl, LCX nl, RCA tortuous/nl;  b. 08/2011 St Echo: EF 60%, normal contractility, no scar/ischemia.  . Sickle cell trait [facility])    Past Surgical History  Procedure Laterality Date  . Tonsillectomy    . Left heart catheterization with coronary angiogram N/A 03/11/2013    Procedure: LEFT HEART CATHETERIZATION WITH CORONARY ANGIOGRAM;  Surgeon: Jolaine Artist, MD;  Location[facility] CATH LAB;  Service: Cardiovascular;  Laterality: N/A;   Family History  Problem Relation Age of Onset  . CAD Mother     died in her 103's - MI  . Multiple myeloma Brother   . Sickle cell anemia Father     died @ 58   Social History  Substance Use Topics  . Smoking status: Never Smoker   . Smokeless tobacco: Never Used  . Alcohol  Use: No    Review of Systems A 10 point review of systems was completed and was negative except for pertinent positives and negatives as mentioned in the history of present illness     Allergies  Review of patient's allergies indicates no known allergies.  Home Medications   Prior to Admission medications   Medication Sig Start Date End Date Taking? Authorizing Provider  amLODipine (NORVASC) 5 MG tablet TAKE 1 TABLET BY MOUTH EVERY DAY 12/13/14  Yes Rhonda G Barrett, PA-C  diltiazem (DILACOR XR) 240 MG 24 hr capsule Take 240 mg by mouth daily.   Yes Historical Provider, MD  isosorbide mononitrate (IMDUR) 30 MG 24 hr tablet TAKE 1 TABLET BY MOUTH EVERY DAY 04/13/14  Yes Satira Sark, MD  naproxen (NAPROSYN) 375 MG tablet Take 1 tablet (375 mg total) by mouth 2 (two) times daily with a meal. 12/08/13  Yes Rhonda G Barrett, PA-C  nitroGLYCERIN (NITROSTAT) 0.4 MG SL tablet Place 1 tablet (0.4 mg total) under the tongue every 5 (five) minutes as needed for chest pain. 03/12/13  Yes Thurnell Lose, MD  omeprazole (PRILOSEC) 40 MG capsule Take 40 mg by mouth daily.   Yes Historical Provider, MD  oxyCODONE (OXY IR/ROXICODONE) 5 MG immediate release tablet Take 1 tablet (5 mg total) by mouth every 6 (six) hours as needed for moderate pain or severe pain. 03/12/13  Yes Thurnell Lose, MD  tamsulosin (FLOMAX) 0.4  MG CAPS capsule Take 0.4 mg by mouth daily.   Yes Historical Provider, MD  tiotropium (SPIRIVA) 18 MCG inhalation capsule Place 18 mcg into inhaler and inhale daily.   Yes Historical Provider, MD  triamterene-hydrochlorothiazide (DYAZIDE) 37.5-25 MG per capsule Take 1 each (1 capsule total) by mouth daily. 12/11/13  Yes Rhonda G Barrett, PA-C   BP 133/91 mmHg  Pulse 66  Temp(Src) 98.4 F (36.9 C) (Oral)  Resp 18  Ht 6' (1.829 m)  Wt 190 lb (86.183 kg)  BMI 25.76 kg/m2  SpO2 99% Physical Exam  Constitutional: He is oriented to person, place, and time. He appears well-developed and  well-nourished.  HENT:  Head: Normocephalic and atraumatic.  Mouth/Throat: Oropharynx is clear and moist.  Eyes: Conjunctivae are normal. Pupils are equal, round, and reactive to light. Right eye exhibits no discharge. Left eye exhibits no discharge. No scleral icterus.  Neck: Neck supple.  Cardiovascular: Normal rate, regular rhythm and normal heart sounds.   Pulmonary/Chest: Effort normal and breath sounds normal. No respiratory distress. He has no wheezes. He has no rales. He exhibits no tenderness.  Abdominal: Soft. There is no tenderness.  Musculoskeletal: Normal range of motion. He exhibits no edema or tenderness.  Neurological: He is alert and oriented to person, place, and time.  Cranial Nerves II-XII grossly intact  Skin: Skin is warm and dry. No rash noted.  Psychiatric: He has a normal mood and affect.  Nursing note and vitals reviewed.   ED Course  Procedures (including critical care time) Labs Review Labs Reviewed  COMPREHENSIVE METABOLIC PANEL - Abnormal; Notable for the following:    Glucose, Bld 130 (*)    Creatinine, Ser 1.40 (*)    Total Protein 8.6 (*)    GFR calc non Af Amer 55 (*)    All other components within normal limits  CBC  TROPONIN I  TROPONIN I    Imaging Review Dg Chest 2 View  02/06/2015  CLINICAL DATA:  Initial encounter for 4 day history of chest pain. EXAM: CHEST  2 VIEW COMPARISON:  03/09/2013.  Chest CT from 12/06/2013. FINDINGS: The lungs are clear without focal pneumonia, edema, pneumothorax or pleural effusion. The cardiopericardial silhouette is within normal limits for size. Imaged bony structures of the thorax are intact. Telemetry leads overlie the chest. IMPRESSION: No active cardiopulmonary disease. Electronically Signed   By: Misty Stanley M.D.   On: 02/06/2015 19:29   I have personally reviewed and evaluated these images and lab results as part of my medical decision-making.   EKG Interpretation   Date/Time:  Monday February 06 2015 18:54:28 EDT Ventricular Rate:  63 PR Interval:  206 QRS Duration: 108 QT Interval:  428 QTC Calculation: 437 R Axis:   -72 Text Interpretation:  Normal sinus rhythm Left anterior fascicular block  Abnormal ECG Confirmed by Hazle Coca [phone]) on 02/06/2015 6:54:30 PM     Meds given in ED:  Medications  fentaNYL (SUBLIMAZE) injection 100 mcg (100 mcg Intravenous Given 02/06/15 1931)    Discharge Medication List as of 02/06/2015 11:23 PM     Filed Vitals:   02/06/15 1901 02/06/15 1902 02/06/15 1955  BP: 141/99  133/91  Pulse: 68  66  Temp: 98.4 F (36.9 C)  98.4 F (36.9 C)  TempSrc: Oral  Oral  Resp: 20  18  Height:  6' (1.829 m)   Weight:  190 lb (86.183 kg)   SpO2: 100%  99%    MDM  Audry Pili A  Rosengrant is a 54 y.o. male with a history of hypertension, COPD, atypical precordial chest pain, normal heart cath in 03/2013, comes in for evaluation of chest pain. Complains of sharp, central chest pain with deep respiration. Constant since Thursday. On arrival, patient was hemodynamically stable with normal vital signs. ECG is abnormal but unchanged from previous ECG. Delta Troponin is negative. Chest x-ray without acute cardiopulmonary pathology. Patient refuses to see my attending, Dr. Ralene Bathe and subsequently reports he is leaving [place]. Patient is of sound mind to make this decision and has been counseled on consequences of his decision including death patient verbalizes understanding and accepts responsibility for decision. Final diagnoses:  None        Comer Locket, PA-C 02/06/15 [place], MD 02/07/15 0003

## 2015-02-06 NOTE — ED Notes (Signed)
MD at bedside. 

## 2015-02-06 NOTE — ED Notes (Signed)
Pa  at bedside. 

## 2015-02-06 NOTE — ED Notes (Signed)
Chest pain and SOB since thursday

## 2015-02-06 NOTE — ED Notes (Signed)
PA at bedside.

## 2015-02-06 NOTE — ED Notes (Signed)
Pt states he dosnt want to stay

## 2015-02-06 NOTE — ED Notes (Signed)
Patient transported to X-ray 

## 2015-02-07 ENCOUNTER — Emergency Department (HOSPITAL_BASED_OUTPATIENT_CLINIC_OR_DEPARTMENT_OTHER)
Admission: EM | Admit: 2015-02-07 | Discharge: 2015-02-07 | Disposition: A | Discharge status: Home / Self Care | Payer: Medicare HMO | Attending: Emergency Medicine | Admitting: Emergency Medicine

## 2015-02-07 ENCOUNTER — Encounter (HOSPITAL_BASED_OUTPATIENT_CLINIC_OR_DEPARTMENT_OTHER): Payer: Self-pay

## 2015-02-07 DIAGNOSIS — Z79899 Other long term (current) drug therapy: Secondary | ICD-10-CM | POA: Diagnosis not present

## 2015-02-07 DIAGNOSIS — R0789 Other chest pain: Secondary | ICD-10-CM | POA: Insufficient documentation

## 2015-02-07 DIAGNOSIS — I10 Essential (primary) hypertension: Secondary | ICD-10-CM | POA: Insufficient documentation

## 2015-02-07 DIAGNOSIS — Z8669 Personal history of other diseases of the nervous system and sense organs: Secondary | ICD-10-CM | POA: Diagnosis not present

## 2015-02-07 DIAGNOSIS — N400 Enlarged prostate without lower urinary tract symptoms: Secondary | ICD-10-CM | POA: Insufficient documentation

## 2015-02-07 DIAGNOSIS — Z862 Personal history of diseases of the blood and blood-forming organs and certain disorders involving the immune mechanism: Secondary | ICD-10-CM | POA: Insufficient documentation

## 2015-02-07 DIAGNOSIS — J449 Chronic obstructive pulmonary disease, unspecified: Secondary | ICD-10-CM | POA: Insufficient documentation

## 2015-02-07 DIAGNOSIS — R079 Chest pain, unspecified: Secondary | ICD-10-CM | POA: Diagnosis present

## 2015-02-07 LAB — CBC WITH DIFFERENTIAL/PLATELET
Basophils Absolute: 0 10*3/uL (ref 0.0–0.1)
Basophils Relative: 0 %
Eosinophils Absolute: 0.3 10*3/uL (ref 0.0–0.7)
Eosinophils Relative: 4 %
HCT: 45.2 % (ref 39.0–52.0)
Hemoglobin: 15.4 g/dL (ref 13.0–17.0)
Lymphocytes Relative: 31 %
Lymphs Abs: 2.2 10*3/uL (ref 0.7–4.0)
MCH: 28.8 pg (ref 26.0–34.0)
MCHC: 34.1 g/dL (ref 30.0–36.0)
MCV: 84.5 fL (ref 78.0–100.0)
Monocytes Absolute: 0.7 10*3/uL (ref 0.1–1.0)
Monocytes Relative: 9 %
Neutro Abs: 3.9 10*3/uL (ref 1.7–7.7)
Neutrophils Relative %: 56 %
Platelets: 212 10*3/uL (ref 150–400)
RBC: 5.35 MIL/uL (ref 4.22–5.81)
RDW: 12.7 % (ref 11.5–15.5)
WBC: 7 10*3/uL (ref 4.0–10.5)

## 2015-02-07 LAB — URINE MICROSCOPIC-ADD ON

## 2015-02-07 LAB — COMPREHENSIVE METABOLIC PANEL
ALT: 25 U/L (ref 17–63)
AST: 39 U/L (ref 15–41)
Albumin: 4.7 g/dL (ref 3.5–5.0)
Alkaline Phosphatase: 67 U/L (ref 38–126)
Anion gap: 7 (ref 5–15)
BUN: 14 mg/dL (ref 6–20)
CO2: 31 mmol/L (ref 22–32)
Calcium: 10 mg/dL (ref 8.9–10.3)
Chloride: 103 mmol/L (ref 101–111)
Creatinine, Ser: 1.26 mg/dL — ABNORMAL HIGH (ref 0.61–1.24)
GFR calc Af Amer: >60 mL/min (ref >60)
GFR calc non Af Amer: >60 mL/min (ref >60)
Glucose, Bld: 97 mg/dL (ref 65–99)
Potassium: 3.5 mmol/L (ref 3.5–5.1)
Sodium: 141 mmol/L (ref 135–145)
Total Bilirubin: 1 mg/dL (ref 0.3–1.2)
Total Protein: 8.9 g/dL — ABNORMAL HIGH (ref 6.5–8.1)

## 2015-02-07 LAB — URINALYSIS, ROUTINE W REFLEX MICROSCOPIC
Bilirubin Urine: NEGATIVE
Glucose, UA: NEGATIVE mg/dL
Ketones, ur: NEGATIVE mg/dL
Leukocytes, UA: NEGATIVE
Nitrite: NEGATIVE
Protein, ur: NEGATIVE mg/dL
Specific Gravity, Urine: 1.018 (ref 1.005–1.030)
Urobilinogen, UA: 0.2 mg/dL (ref 0.0–1.0)
pH: 7 (ref 5.0–8.0)

## 2015-02-07 LAB — RAPID URINE DRUG SCREEN, HOSP PERFORMED
Amphetamines: NOT DETECTED
Barbiturates: NOT DETECTED
Benzodiazepines: NOT DETECTED
Cocaine: NOT DETECTED
Opiates: NOT DETECTED
Tetrahydrocannabinol: NOT DETECTED

## 2015-02-07 LAB — PROTIME-INR
INR: 0.93 (ref 0.00–1.49)
Prothrombin Time: 12.7 seconds (ref 11.6–15.2)

## 2015-02-07 LAB — TROPONIN I: Troponin I: <0.03 ng/mL (ref <0.031)

## 2015-02-07 LAB — LIPASE, BLOOD: Lipase: 21 U/L (ref 11–51)

## 2015-02-07 MED ORDER — NITROGLYCERIN 0.4 MG SL SUBL
SUBLINGUAL_TABLET | SUBLINGUAL | Status: AC
  Administered 2015-02-07: 0.4 mg via SUBLINGUAL
  Filled 2015-02-07: qty 3

## 2015-02-07 MED ORDER — MORPHINE SULFATE (PF) 4 MG/ML IV SOLN
4.0000 mg | Freq: Once | INTRAVENOUS | Status: AC
  Administered 2015-02-07: 4 mg via INTRAVENOUS
  Filled 2015-02-07: qty 1

## 2015-02-07 MED ORDER — HYDROCODONE-ACETAMINOPHEN 5-325 MG PO TABS: 1.0000 | ORAL_TABLET | ORAL | Status: DC | PRN

## 2015-02-07 MED ORDER — ISOSORBIDE MONONITRATE ER 60 MG PO TB24: 60.0000 mg | ORAL_TABLET | Freq: Every day | ORAL | Status: DC

## 2015-02-07 MED ORDER — ACETAMINOPHEN 325 MG PO TABS
650.0000 mg | ORAL_TABLET | Freq: Once | ORAL | Status: AC
  Administered 2015-02-07: 650 mg via ORAL
  Filled 2015-02-07: qty 2

## 2015-02-07 MED ORDER — ASPIRIN 81 MG PO CHEW
CHEWABLE_TABLET | ORAL | Status: AC
  Administered 2015-02-07: 324 mg
  Filled 2015-02-07: qty 4

## 2015-02-07 MED ORDER — HEPARIN (PORCINE) IN NACL 100-0.45 UNIT/ML-% IJ SOLN
INTRAMUSCULAR | Status: AC
  Filled 2015-02-07: qty 250

## 2015-02-07 MED ORDER — AMLODIPINE BESYLATE 10 MG PO TABS: 10.0000 mg | ORAL_TABLET | Freq: Every day | ORAL | Status: DC

## 2015-02-07 MED ORDER — ISOSORBIDE MONONITRATE ER 60 MG PO TB24
60.0000 mg | ORAL_TABLET | Freq: Every day | ORAL | Status: DC
  Filled 2015-02-07: qty 1

## 2015-02-07 MED ORDER — NITROGLYCERIN 2 % TD OINT
1.0000 [in_us] | TOPICAL_OINTMENT | Freq: Once | TRANSDERMAL | Status: AC
  Administered 2015-02-07: 1 [in_us] via TOPICAL
  Filled 2015-02-07: qty 1

## 2015-02-07 MED ORDER — ONDANSETRON HCL 4 MG/2ML IJ SOLN
4.0000 mg | Freq: Once | INTRAMUSCULAR | Status: AC
  Administered 2015-02-07: 4 mg via INTRAVENOUS
  Filled 2015-02-07: qty 2

## 2015-02-07 MED ORDER — NITROGLYCERIN 0.4 MG SL SUBL
0.4000 mg | SUBLINGUAL_TABLET | SUBLINGUAL | Status: DC | PRN
  Administered 2015-02-07 (×3): 0.4 mg via SUBLINGUAL
  Filled 2015-02-07: qty 1

## 2015-02-07 MED ORDER — NITROGLYCERIN IN D5W 200-5 MCG/ML-% IV SOLN
INTRAVENOUS | Status: AC
  Filled 2015-02-07: qty 250

## 2015-02-07 NOTE — ED Notes (Signed)
NTG paste removed per order Dr. Johnney Killian.

## 2015-02-07 NOTE — Discharge Instructions (Signed)
Nonspecific Chest Pain  °Chest pain can be caused by many different conditions. There is always a chance that your pain could be related to something serious, such as a heart attack or a blood clot in your lungs. Chest pain can also be caused by conditions that are not life-threatening. If you have chest pain, it is very important to follow up with your health care provider. °CAUSES  °Chest pain can be caused by: °· Heartburn. °· Pneumonia or bronchitis. °· Anxiety or stress. °· Inflammation around your heart (pericarditis) or lung (pleuritis or pleurisy). °· A blood clot in your lung. °· A collapsed lung (pneumothorax). It can develop suddenly on its own (spontaneous pneumothorax) or from trauma to the chest. °· Shingles infection (varicella-zoster virus). °· Heart attack. °· Damage to the bones, muscles, and cartilage that make up your chest wall. This can include: °¨ Bruised bones due to injury. °¨ Strained muscles or cartilage due to frequent or repeated coughing or overwork. °¨ Fracture to one or more ribs. °¨ Sore cartilage due to inflammation (costochondritis). °RISK FACTORS  °Risk factors for chest pain may include: °· Activities that increase your risk for trauma or injury to your chest. °· Respiratory infections or conditions that cause frequent coughing. °· Medical conditions or overeating that can cause heartburn. °· Heart disease or family history of heart disease. °· Conditions or health behaviors that increase your risk of developing a blood clot. °· Having had chicken pox (varicella zoster). °SIGNS AND SYMPTOMS °Chest pain can feel like: °· Burning or tingling on the surface of your chest or deep in your chest. °· Crushing, pressure, aching, or squeezing pain. °· Dull or sharp pain that is worse when you move, cough, or take a deep breath. °· Pain that is also felt in your back, neck, shoulder, or arm, or pain that spreads to any of these areas. °Your chest pain may come and go, or it may stay  constant. °DIAGNOSIS °Lab tests or other studies may be needed to find the cause of your pain. Your health care provider may have you take a test called an ambulatory ECG (electrocardiogram). An ECG records your heartbeat patterns at the time the test is performed. You may also have other tests, such as: °· Transthoracic echocardiogram (TTE). During echocardiography, sound waves are used to create a picture of all of the heart structures and to look at how blood flows through your heart. °· Transesophageal echocardiogram (TEE). This is a more advanced imaging test that obtains images from inside your body. It allows your health care provider to see your heart in finer detail. °· Cardiac monitoring. This allows your health care provider to monitor your heart rate and rhythm in real time. °· Holter monitor. This is a portable device that records your heartbeat and can help to diagnose abnormal heartbeats. It allows your health care provider to track your heart activity for several days, if needed. °· Stress tests. These can be done through exercise or by taking medicine that makes your heart beat more quickly. °· Blood tests. °· Imaging tests. °TREATMENT  °Your treatment depends on what is causing your chest pain. Treatment may include: °· Medicines. These may include: °¨ Acid blockers for heartburn. °¨ Anti-inflammatory medicine. °¨ Pain medicine for inflammatory conditions. °¨ Antibiotic medicine, if an infection is present. °¨ Medicines to dissolve blood clots. °¨ Medicines to treat coronary artery disease. °· Supportive care for conditions that do not require medicines. This may include: °¨ Resting. °¨ Applying heat   or cold packs to injured areas. °¨ Limiting activities until pain decreases. °HOME CARE INSTRUCTIONS °· If you were prescribed an antibiotic medicine, finish it all even if you start to feel better. °· Avoid any activities that bring on chest pain. °· Do not use any tobacco products, including  cigarettes, chewing tobacco, or electronic cigarettes. If you need help quitting, ask your health care provider. °· Do not drink alcohol. °· Take medicines only as directed by your health care provider. °· Keep all follow-up visits as directed by your health care provider. This is important. This includes any further testing if your chest pain does not go away. °· If heartburn is the cause for your chest pain, you may be told to keep your head raised (elevated) while sleeping. This reduces the chance that acid will go from your stomach into your esophagus. °· Make lifestyle changes as directed by your health care provider. These may include: °¨ Getting regular exercise. Ask your health care provider to suggest some activities that are safe for you. °¨ Eating a heart-healthy diet. A registered dietitian can help you to learn healthy eating options. °¨ Maintaining a healthy weight. °¨ Managing diabetes, if necessary. °¨ Reducing stress. °SEEK MEDICAL CARE IF: °· Your chest pain does not go away after treatment. °· You have a rash with blisters on your chest. °· You have a fever. °SEEK IMMEDIATE MEDICAL CARE IF:  °· Your chest pain is worse. °· You have an increasing cough, or you cough up blood. °· You have severe abdominal pain. °· You have severe weakness. °· You faint. °· You have chills. °· You have sudden, unexplained chest discomfort. °· You have sudden, unexplained discomfort in your arms, back, neck, or jaw. °· You have shortness of breath at any time. °· You suddenly start to sweat, or your skin gets clammy. °· You feel nauseous or you vomit. °· You suddenly feel light-headed or dizzy. °· Your heart begins to beat quickly, or it feels like it is skipping beats. °These symptoms may represent a serious problem that is an emergency. Do not wait to see if the symptoms will go away. Get medical help right away. Call your local emergency services (911 in the [place] Do not drive yourself to the hospital. °  °This  information is not intended to replace advice given to you by your health care provider. Make sure you discuss any questions you have with your health care provider. °  °Document Released: 01/02/2005 Document Revised: 04/15/2014 Document Reviewed: 10/29/2013 °Elsevier Interactive Patient Education ©2016 [employer] ° °

## 2015-02-07 NOTE — ED Notes (Signed)
Denies cp

## 2015-02-07 NOTE — ED Notes (Signed)
Pt states he was seen for CP here last night-left with plans for PCP to admit him to [facility] today-[facility] has no beds so he was advised to come back to ED-pt c/o CP x "couple days"

## 2015-02-07 NOTE — ED Provider Notes (Signed)
CSN: [id number]     Arrival date & time 02/07/15  1146 History   First MD Initiated Contact with Patient 02/07/15 1158     Chief Complaint  Patient presents with  . Chest Pain     (Consider location/radiation/quality/duration/timing/severity/associated sxs/prior Treatment) HPI Patient states he's been having chest pain for about 5 days now. He reports his nearly continuous. It does get worse at sometimes compared to others. He reports that his sharp and uncomfortable in his left chest. He reports it does get worse with exertion. He also notes it is worse with deep inspiration. He denies he's had cough or fever or sputum production. No nausea or diaphoresis. He has no lower extremity symptoms. The patient was seen in the emergency department yesterday evening. At that time he did rule out for MI with 2 sets of cardiac enzymes but left AMA. Past Medical History  Diagnosis Date  . COPD (chronic obstructive pulmonary disease) ([place])   . Enlarged prostate   . Sleep apnea   . Hypertension   . Chest pain     a. 11/2002 Cath: EF 68%, LM nl, LAD small, nl, LCX nl, RCA tortuous/nl;  b. 08/2011 St Echo: EF 60%, normal contractility, no scar/ischemia.  . Sickle cell trait [facility])    Past Surgical History  Procedure Laterality Date  . Tonsillectomy    . Left heart catheterization with coronary angiogram N/A 03/11/2013    Procedure: LEFT HEART CATHETERIZATION WITH CORONARY ANGIOGRAM;  Surgeon: Jolaine Artist, MD;  Location: [facility] CATH LAB;  Service: Cardiovascular;  Laterality: N/A;   Family History  Problem Relation Age of Onset  . CAD Mother     died in her 14's - MI  . Multiple myeloma Brother   . Sickle cell anemia Father     died @ 14   Social History  Substance Use Topics  . Smoking status: Never Smoker   . Smokeless tobacco: Never Used  . Alcohol Use: No    Review of Systems 10 Systems reviewed and are negative for acute change except as noted in the HPI.    Allergies  Review of  patient's allergies indicates no known allergies.  Home Medications   Prior to Admission medications   Medication Sig Start Date End Date Taking? Authorizing Provider  amLODipine (NORVASC) 10 MG tablet Take 1 tablet (10 mg total) by mouth daily. 02/07/15   Charlesetta Shanks, MD  amLODipine (NORVASC) 5 MG tablet TAKE 1 TABLET BY MOUTH EVERY DAY 12/13/14   Evelene Croon Barrett, PA-C  HYDROcodone-acetaminophen (NORCO/VICODIN) 5-325 MG tablet Take 1-2 tablets by mouth every 4 (four) hours as needed for moderate pain or severe pain. 02/07/15   Charlesetta Shanks, MD  isosorbide mononitrate (IMDUR) 30 MG 24 hr tablet TAKE 1 TABLET BY MOUTH EVERY DAY 04/13/14   Satira Sark, MD  isosorbide mononitrate (IMDUR) 60 MG 24 hr tablet Take 1 tablet (60 mg total) by mouth daily. 02/07/15   Charlesetta Shanks, MD  naproxen (NAPROSYN) 375 MG tablet Take 1 tablet (375 mg total) by mouth 2 (two) times daily with a meal. 12/08/13   Rhonda G Barrett, PA-C  nitroGLYCERIN (NITROSTAT) 0.4 MG SL tablet Place 1 tablet (0.4 mg total) under the tongue every 5 (five) minutes as needed for chest pain. 03/12/13   Thurnell Lose, MD  omeprazole (PRILOSEC) 40 MG capsule Take 40 mg by mouth daily.    Historical Provider, MD  oxyCODONE (OXY IR/ROXICODONE) 5 MG immediate release tablet Take 1 tablet (  5 mg total) by mouth every 6 (six) hours as needed for moderate pain or severe pain. 03/12/13   Thurnell Lose, MD  tamsulosin (FLOMAX) 0.4 MG CAPS capsule Take 0.4 mg by mouth daily.    Historical Provider, MD  tiotropium (SPIRIVA) 18 MCG inhalation capsule Place 18 mcg into inhaler and inhale daily.    Historical Provider, MD  triamterene-hydrochlorothiazide (DYAZIDE) 37.5-25 MG per capsule Take 1 each (1 capsule total) by mouth daily. 12/11/13   Rhonda G Barrett, PA-C   BP 117/82 mmHg  Pulse 62  Temp(Src) 98.3 F (36.8 C) (Oral)  Resp 18  Ht 6' (1.829 m)  Wt 193 lb (87.544 kg)  BMI 26.17 kg/m2  SpO2 100% Physical Exam  Constitutional: He is  oriented to person, place, and time. He appears well-developed and well-nourished.  Patient appears to be in pain. He is alert and nontoxic. He has no restaurant stress. Skin is warm and dry.  HENT:  Head: Normocephalic and atraumatic.  Eyes: EOM are normal. Pupils are equal, round, and reactive to light.  Neck: Neck supple.  Cardiovascular: Normal rate, regular rhythm, normal heart sounds and intact distal pulses.   Pulmonary/Chest: Effort normal and breath sounds normal.  Abdominal: Soft. Bowel sounds are normal. He exhibits no distension. There is no tenderness.  Musculoskeletal: Normal range of motion. He exhibits no edema.  Neurological: He is alert and oriented to person, place, and time. He has normal strength. Coordination normal. GCS eye subscore is 4. GCS verbal subscore is 5. GCS motor subscore is 6.  Skin: Skin is warm, dry and intact.  Psychiatric: He has a normal mood and affect.    ED Course  Procedures (including critical care time) Labs Review Labs Reviewed  COMPREHENSIVE METABOLIC PANEL - Abnormal; Notable for the following:    Creatinine, Ser 1.26 (*)    Total Protein 8.9 (*)    All other components within normal limits  URINALYSIS, ROUTINE W REFLEX MICROSCOPIC (NOT AT [facility]) - Abnormal; Notable for the following:    Hgb urine dipstick TRACE (*)    All other components within normal limits  LIPASE, BLOOD  TROPONIN I  CBC WITH DIFFERENTIAL/PLATELET  PROTIME-INR  URINE RAPID DRUG SCREEN, HOSP PERFORMED  URINE MICROSCOPIC-ADD ON    Imaging Review Dg Chest 2 View  02/06/2015  CLINICAL DATA:  Initial encounter for 4 day history of chest pain. EXAM: CHEST  2 VIEW COMPARISON:  03/09/2013.  Chest CT from 12/06/2013. FINDINGS: The lungs are clear without focal pneumonia, edema, pneumothorax or pleural effusion. The cardiopericardial silhouette is within normal limits for size. Imaged bony structures of the thorax are intact. Telemetry leads overlie the chest. IMPRESSION:  No active cardiopulmonary disease. Electronically Signed   By: Misty Stanley M.D.   On: 02/06/2015 19:29   I have personally reviewed and evaluated these images and lab results as part of my medical decision-making.   EKG Interpretation   Date/Time:  Tuesday February 07 2015 11:55:46 EDT Ventricular Rate:  65 PR Interval:  208 QRS Duration: 104 QT Interval:  420 QTC Calculation: 436 R Axis:   -72 Text Interpretation:  NSR. Left axis deviation. no sig change from  previous. Confirmed by Johnney Killian, MD, Jeannie Done [phone]) on 02/07/2015 3:04:24 PM      consult: Patient's case is reviewed Dr. Oval Linsey of cardiology. EKG comparisons are made and it is determined that this is consistent with prior EKGs and no STEMI alert is called. Patient had 2 sets of cardiac enzymes  yesterday evening both negative and a cardiac cath 2 years ago without coronary artery disease. At this time Dr. Oval Linsey did not feel that this was likely to be a cardiac ischemic event. Discussion was had regarding a Prinzmetal angina type of presentation and recommendations are made to increase the patient's Imdur to 60 mg daily and amlodipine to 10 mg daily with planned cardiology follow-up. The patient will be contacted for follow-up this week.  MDM   Final diagnoses:  Atypical chest pain     Patient presents with chest pain that has been present for 5 days. He has ruled out for MI with cardiac enzymes. EKG had abnormality with repolarization elevation. Comparisons were made to old EKGs and this was determined to be present previously. The patient does not have history suggestive of pulmonary embolus. He has no lower extremity symptoms, travel history or personal history of DVT or PE. There is no tachypnea, hypoxia or tachycardia. Patient was admitted approximately one month ago with a chest pain evaluation and at that time he had a CT chest that ruled out thoracic or significant anatomical laterality's. It is and the patient will have  close cardiac follow-up this week.    Charlesetta Shanks, MD 02/07/15 [phone]

## 2015-02-07 NOTE — ED Notes (Signed)
PT denies c/p at present.

## 2015-02-07 NOTE — ED Notes (Signed)
Rates pain 8/10.

## 2015-02-07 NOTE — ED Notes (Signed)
Dr Johnney Killian aware of isosorbide is not stored in our pyxis.

## 2015-02-14 ENCOUNTER — Ambulatory Visit: Payer: Medicare HMO | Admitting: Cardiology

## 2015-02-14 DIAGNOSIS — R0989 Other specified symptoms and signs involving the circulatory and respiratory systems: Secondary | ICD-10-CM

## 2015-02-16 ENCOUNTER — Encounter: Payer: Self-pay | Admitting: Cardiology

## 2015-03-25 DIAGNOSIS — N529 Male erectile dysfunction, unspecified: Secondary | ICD-10-CM | POA: Insufficient documentation

## 2015-04-25 DIAGNOSIS — R350 Frequency of micturition: Secondary | ICD-10-CM | POA: Insufficient documentation

## 2015-06-21 DIAGNOSIS — R413 Other amnesia: Secondary | ICD-10-CM | POA: Insufficient documentation

## 2015-06-21 DIAGNOSIS — G5680 Other specified mononeuropathies of unspecified upper limb: Secondary | ICD-10-CM | POA: Insufficient documentation

## 2015-06-21 DIAGNOSIS — M542 Cervicalgia: Secondary | ICD-10-CM | POA: Insufficient documentation

## 2015-06-21 DIAGNOSIS — R202 Paresthesia of skin: Secondary | ICD-10-CM | POA: Insufficient documentation

## 2015-08-04 DIAGNOSIS — R059 Cough, unspecified: Secondary | ICD-10-CM | POA: Insufficient documentation

## 2015-09-27 DIAGNOSIS — K219 Gastro-esophageal reflux disease without esophagitis: Secondary | ICD-10-CM | POA: Insufficient documentation

## 2016-02-05 DIAGNOSIS — Z860100 Personal history of colon polyps, unspecified: Secondary | ICD-10-CM | POA: Insufficient documentation

## 2016-04-17 DIAGNOSIS — I517 Cardiomegaly: Secondary | ICD-10-CM | POA: Insufficient documentation

## 2016-05-19 ENCOUNTER — Encounter (HOSPITAL_BASED_OUTPATIENT_CLINIC_OR_DEPARTMENT_OTHER): Payer: Self-pay | Admitting: Emergency Medicine

## 2016-05-19 ENCOUNTER — Emergency Department (HOSPITAL_BASED_OUTPATIENT_CLINIC_OR_DEPARTMENT_OTHER): Payer: Medicare Other

## 2016-05-19 ENCOUNTER — Emergency Department (HOSPITAL_BASED_OUTPATIENT_CLINIC_OR_DEPARTMENT_OTHER)
Admission: EM | Admit: 2016-05-19 | Discharge: 2016-05-19 | Disposition: A | Discharge status: Home / Self Care | Payer: Medicare Other | Attending: Emergency Medicine | Admitting: Emergency Medicine

## 2016-05-19 DIAGNOSIS — R079 Chest pain, unspecified: Secondary | ICD-10-CM | POA: Diagnosis present

## 2016-05-19 DIAGNOSIS — J449 Chronic obstructive pulmonary disease, unspecified: Secondary | ICD-10-CM | POA: Diagnosis not present

## 2016-05-19 DIAGNOSIS — Z79899 Other long term (current) drug therapy: Secondary | ICD-10-CM | POA: Insufficient documentation

## 2016-05-19 DIAGNOSIS — I10 Essential (primary) hypertension: Secondary | ICD-10-CM | POA: Diagnosis not present

## 2016-05-19 HISTORY — DX: Supraventricular tachycardia, unspecified: I47.10

## 2016-05-19 HISTORY — DX: Supraventricular tachycardia: I47.1

## 2016-05-19 LAB — CBC
HCT: 46.3 % (ref 39.0–52.0)
Hemoglobin: 15.6 g/dL (ref 13.0–17.0)
MCH: 28.4 pg (ref 26.0–34.0)
MCHC: 33.7 g/dL (ref 30.0–36.0)
MCV: 84.3 fL (ref 78.0–100.0)
Platelets: 256 10*3/uL (ref 150–400)
RBC: 5.49 MIL/uL (ref 4.22–5.81)
RDW: 13.1 % (ref 11.5–15.5)
WBC: 7.1 10*3/uL (ref 4.0–10.5)

## 2016-05-19 LAB — BASIC METABOLIC PANEL
Anion gap: 7 (ref 5–15)
BUN: 15 mg/dL (ref 6–20)
CO2: 28 mmol/L (ref 22–32)
Calcium: 9.4 mg/dL (ref 8.9–10.3)
Chloride: 104 mmol/L (ref 101–111)
Creatinine, Ser: 1.45 mg/dL — ABNORMAL HIGH (ref 0.61–1.24)
GFR calc Af Amer: >60 mL/min (ref >60)
GFR calc non Af Amer: 52 mL/min — ABNORMAL LOW (ref >60)
Glucose, Bld: 137 mg/dL — ABNORMAL HIGH (ref 65–99)
Potassium: 3.4 mmol/L — ABNORMAL LOW (ref 3.5–5.1)
Sodium: 139 mmol/L (ref 135–145)

## 2016-05-19 LAB — TROPONIN I
Troponin I: <0.03 ng/mL (ref <0.03)
Troponin I: <0.03 ng/mL (ref <0.03)

## 2016-05-19 LAB — D-DIMER, QUANTITATIVE: D-Dimer, Quant: <0.27 ug/mL-FEU (ref 0.00–0.50)

## 2016-05-19 NOTE — ED Provider Notes (Signed)
[facility] DEPT [facility] Provider Note   CSN: [id number] Arrival date & time: 05/19/16  1620 By signing my name below, I, Dyke Brackett, attest that this documentation has been prepared under the direction and in the presence of Quintella Reichert, MD . Electronically Signed: Dyke Brackett, Scribe. 05/19/2016. 12:10 AM.   History   Chief Complaint Chief Complaint  Patient presents with  . Chest Pain   HPI [name] is a 56 y.o. male with a h/o COPD, HTN, SVT, abdormal EKG and atypical chest pain who presents to the Emergency Department complaining of frequent, intermittent, gradually improving CP x 10 days. He describes his pain as sharp, severe left sided chest pain that radiates to his left abdomen. Pt has taken antacids with no relief. Pt reports taking NGL with some temporary relief of pain. Per pt, nothing brings about or alleviates pain. He notes associated SOB, nonproductive cough, upper back pain, and lightheadedness. Pt states he had an injection to his neck for treatment of cervical radiculopathy  two days prior to his symptoms.  Per pt, his last heart catheterization was 2 years ago at [facility]. No PMHx of blood clots, MI, HLD, or DM. He is a nonsmoker. Pt denies any diaphoresis, nausea, or vomiting.   The history is provided by the patient. No language interpreter was used.   Past Medical History:  Diagnosis Date  . Chest pain    a. 11/2002 Cath: EF 68%, LM nl, LAD small, nl, LCX nl, RCA tortuous/nl;  b. 08/2011 St Echo: EF 60%, normal contractility, no scar/ischemia.  Marland Kitchen COPD (chronic obstructive pulmonary disease) ([place])   . Enlarged prostate   . Hypertension   . Sickle cell trait ([place])   . Sleep apnea   . SVT (supraventricular tachycardia) [facility])     Patient Active Problem List   Diagnosis Date Noted  . Abdominal pain 12/05/2013  . Diarrhea 12/05/2013  . Hypokalemia 12/05/2013  . Chest pain- atypical. Normal LHC 03/2013 03/10/2013  . HTN (hypertension) 03/10/2013    . Nonspecific abnormal electrocardiogram (ECG) (EKG) 03/10/2013    Past Surgical History:  Procedure Laterality Date  . LEFT HEART CATHETERIZATION WITH CORONARY ANGIOGRAM N/A 03/11/2013   Procedure: LEFT HEART CATHETERIZATION WITH CORONARY ANGIOGRAM;  Surgeon: Jolaine Artist, MD;  Location: [facility] CATH LAB;  Service: Cardiovascular;  Laterality: N/A;  . TONSILLECTOMY    . TRANSURETHRAL RESECTION OF PROSTATE       Home Medications    Prior to Admission medications   Medication Sig Start Date End Date Taking? Authorizing Provider  amLODipine (NORVASC) 10 MG tablet Take 1 tablet (10 mg total) by mouth daily. 02/07/15  Yes Charlesetta Shanks, MD  HYDROcodone-acetaminophen (NORCO/VICODIN) 5-325 MG tablet Take 1-2 tablets by mouth every 4 (four) hours as needed for moderate pain or severe pain. 02/07/15  Yes Charlesetta Shanks, MD  isosorbide mononitrate (IMDUR) 60 MG 24 hr tablet Take 1 tablet (60 mg total) by mouth daily. 02/07/15  Yes Charlesetta Shanks, MD  naproxen (NAPROSYN) 375 MG tablet Take 1 tablet (375 mg total) by mouth 2 (two) times daily with a meal. 12/08/13  Yes Rhonda G Barrett, PA-C  nitroGLYCERIN (NITROSTAT) 0.4 MG SL tablet Place 1 tablet (0.4 mg total) under the tongue every 5 (five) minutes as needed for chest pain. 03/12/13  Yes Thurnell Lose, MD  omeprazole (PRILOSEC) 40 MG capsule Take 40 mg by mouth daily.   Yes Historical Provider, MD  oxyCODONE (OXY IR/ROXICODONE) 5 MG immediate release tablet Take  1 tablet (5 mg total) by mouth every 6 (six) hours as needed for moderate pain or severe pain. 03/12/13  Yes Leroy Sea, MD  tamsulosin (FLOMAX) 0.4 MG CAPS capsule Take 0.4 mg by mouth daily.   Yes Historical Provider, MD  tiotropium (SPIRIVA) 18 MCG inhalation capsule Place 18 mcg into inhaler and inhale daily.   Yes Historical Provider, MD  triamterene-hydrochlorothiazide (DYAZIDE) 37.5-25 MG per capsule Take 1 each (1 capsule total) by mouth daily. 12/11/13  Yes Rhonda G Barrett,  PA-C  amLODipine (NORVASC) 5 MG tablet TAKE 1 TABLET BY MOUTH EVERY DAY 12/13/14   Joline Salt Barrett, PA-C  isosorbide mononitrate (IMDUR) 30 MG 24 hr tablet TAKE 1 TABLET BY MOUTH EVERY DAY 04/13/14   Jonelle Sidle, MD    Family History Family History  Problem Relation Age of Onset  . CAD Mother     died in her 17's - MI  . Multiple myeloma Brother   . Sickle cell anemia Father     died @ 19    Social History Social History  Substance Use Topics  . Smoking status: Never Smoker  . Smokeless tobacco: Never Used  . Alcohol use No    Allergies   Patient has no known allergies.  Review of Systems Review of Systems 10 systems reviewed and all are negative for acute change except as noted in the HPI.  Physical Exam Updated Vital Signs BP 124/95   Pulse 66   Temp 98.8 F (37.1 C) (Oral)   Resp 15   Ht 5\' 11"  (1.803 m)   Wt 205 lb (93 kg)   SpO2 97%   BMI 28.59 kg/m   Physical Exam  Constitutional: He is oriented to person, place, and time. He appears well-developed and well-nourished.  HENT:  Head: Normocephalic and atraumatic.  Cardiovascular: Normal rate and regular rhythm.   No murmur heard. Pulmonary/Chest: Effort normal and breath sounds normal. No respiratory distress.  Abdominal: Soft. There is no tenderness. There is no rebound and no guarding.  Musculoskeletal: He exhibits no edema or tenderness.  Neurological: He is alert and oriented to person, place, and time.  Skin: Skin is warm and dry.  Psychiatric: He has a normal mood and affect. His behavior is normal.  Nursing note and vitals reviewed.  ED Treatments / Results  DIAGNOSTIC STUDIES:  Oxygen Saturation is 100% on RA, normal by my interpretation.    COORDINATION OF CARE:  5:07 PM Discussed treatment plan with pt at bedside and pt agreed to plan.  Labs (all labs ordered are listed, but only abnormal results are displayed) Labs Reviewed  BASIC METABOLIC PANEL - Abnormal; Notable for the  following:       Result Value   Potassium 3.4 (*)    Glucose, Bld 137 (*)    Creatinine, Ser 1.45 (*)    GFR calc non Af Amer 52 (*)    All other components within normal limits  CBC  TROPONIN I  D-DIMER, QUANTITATIVE (NOT AT [facility])  TROPONIN I    EKG  EKG Interpretation  Date/Time:  Sunday May 19 2016 16:37:39 EST Ventricular Rate:  77 PR Interval:  190 QRS Duration: 124 QT Interval:  416 QTC Calculation: 470 R Axis:   -93 Text Interpretation:  Normal sinus rhythm Right superior axis deviation Non-specific intra-ventricular conduction delay Abnormal ECG Confirmed by 03-15-1988 [id number]) on 05/19/2016 5:08:36 PM       Radiology Dg Chest 2 View  Result Date:  05/19/2016 CLINICAL DATA:  Chest pain, cough, dizziness x 10 days EXAM: CHEST  2 VIEW COMPARISON:  09/27/2015 FINDINGS: Lungs are clear.  No pleural effusion or pneumothorax. The heart is normal in size. Degenerative changes of the visualized thoracolumbar spine. IMPRESSION: No evidence of acute cardiopulmonary disease. Electronically Signed   By: Julian Hy M.D.   On: 05/19/2016 17:05    Procedures Procedures (including critical care time)  Medications Ordered in ED Medications - No data to display   Initial Impression / Assessment and Plan / ED Course  I have reviewed the triage vital signs and the nursing notes.  Pertinent labs & imaging results that were available during my care of the patient were reviewed by me and considered in my medical decision making (see chart for details).     The patient here for evaluation of 10 days of episodic chest pain. EKG does have some ST elevation that is present on prior EKGs. He has no pain in the emergency department and his labs are at his baseline. Current clinical picture is not consistent with ACS, and unstable angina, PE, dissection, pneumonia. Discussed with patient importance of cardiology follow-up for undifferentiated chest pain. Discussed home care and  return precautions.  Final Clinical Impressions(s) / ED Diagnoses   Final diagnoses:  Nonspecific chest pain    New Prescriptions Discharge Medication List as of 05/19/2016  8:21 PM    I personally performed the services described in this documentation, which was scribed in my presence. The recorded information has been reviewed and is accurate.    Quintella Reichert, MD 05/20/16 [phone]

## 2016-05-19 NOTE — ED Notes (Signed)
Pt on cardiac monitor, auto VS and pulse ox.

## 2016-05-19 NOTE — ED Triage Notes (Signed)
Chest pain and dizziness for last 10 days . Denies at present

## 2016-05-20 ENCOUNTER — Emergency Department (HOSPITAL_COMMUNITY): Payer: Medicare Other

## 2016-05-20 ENCOUNTER — Encounter (HOSPITAL_COMMUNITY): Payer: Self-pay

## 2016-05-20 DIAGNOSIS — Z5321 Procedure and treatment not carried out due to patient leaving prior to being seen by health care provider: Secondary | ICD-10-CM | POA: Diagnosis not present

## 2016-05-20 DIAGNOSIS — R079 Chest pain, unspecified: Secondary | ICD-10-CM | POA: Insufficient documentation

## 2016-05-20 LAB — CBC
HCT: 44.6 % (ref 39.0–52.0)
Hemoglobin: 15 g/dL (ref 13.0–17.0)
MCH: 28.5 pg (ref 26.0–34.0)
MCHC: 33.6 g/dL (ref 30.0–36.0)
MCV: 84.8 fL (ref 78.0–100.0)
Platelets: 253 10*3/uL (ref 150–400)
RBC: 5.26 MIL/uL (ref 4.22–5.81)
RDW: 13 % (ref 11.5–15.5)
WBC: 9.4 10*3/uL (ref 4.0–10.5)

## 2016-05-20 LAB — BASIC METABOLIC PANEL
Anion gap: 8 (ref 5–15)
BUN: 13 mg/dL (ref 6–20)
CO2: 27 mmol/L (ref 22–32)
Calcium: 9.3 mg/dL (ref 8.9–10.3)
Chloride: 103 mmol/L (ref 101–111)
Creatinine, Ser: 1.47 mg/dL — ABNORMAL HIGH (ref 0.61–1.24)
GFR calc Af Amer: 59 mL/min — ABNORMAL LOW (ref >60)
GFR calc non Af Amer: 51 mL/min — ABNORMAL LOW (ref >60)
Glucose, Bld: 101 mg/dL — ABNORMAL HIGH (ref 65–99)
Potassium: 3.9 mmol/L (ref 3.5–5.1)
Sodium: 138 mmol/L (ref 135–145)

## 2016-05-20 LAB — I-STAT TROPONIN, ED: Troponin i, poc: 0 ng/mL (ref 0.00–0.08)

## 2016-05-20 NOTE — ED Triage Notes (Signed)
Pt complaining of left sided sharp chest pain that radiates to L arm, back and L jaw. Pt states seen by ED for same last night. Pt denies any cough or SOB. Pt states some nausea, denies any emesis or diarrhea.

## 2016-05-21 ENCOUNTER — Emergency Department (HOSPITAL_COMMUNITY)
Admission: EM | Admit: 2016-05-21 | Discharge: 2016-05-21 | Disposition: A | Discharge status: Home / Self Care | Payer: Medicare Other | Attending: Dermatology | Admitting: Dermatology

## 2016-05-21 NOTE — ED Notes (Signed)
Pt called for once more for vital sign reassessment. No answer no response

## 2016-05-21 NOTE — ED Notes (Signed)
Pt called for x2 for vital sign reassessment. No answer

## 2016-08-16 DIAGNOSIS — K921 Melena: Secondary | ICD-10-CM | POA: Insufficient documentation

## 2016-08-16 DIAGNOSIS — D649 Anemia, unspecified: Secondary | ICD-10-CM | POA: Insufficient documentation

## 2016-10-17 DIAGNOSIS — Z9889 Other specified postprocedural states: Secondary | ICD-10-CM | POA: Insufficient documentation

## 2016-10-17 DIAGNOSIS — I309 Acute pericarditis, unspecified: Secondary | ICD-10-CM | POA: Insufficient documentation

## 2016-10-23 DIAGNOSIS — R1319 Other dysphagia: Secondary | ICD-10-CM | POA: Insufficient documentation

## 2016-12-15 IMAGING — CR DG CHEST 2V
2 series · 2 of 2 positions shown · non-contrast
Comparison: 03/09/2013.  Chest CT from 12/06/2013.

CLINICAL DATA: Initial encounter for 4 day history of chest pain.

EXAM:
CHEST  2 VIEW

[w chest pa]
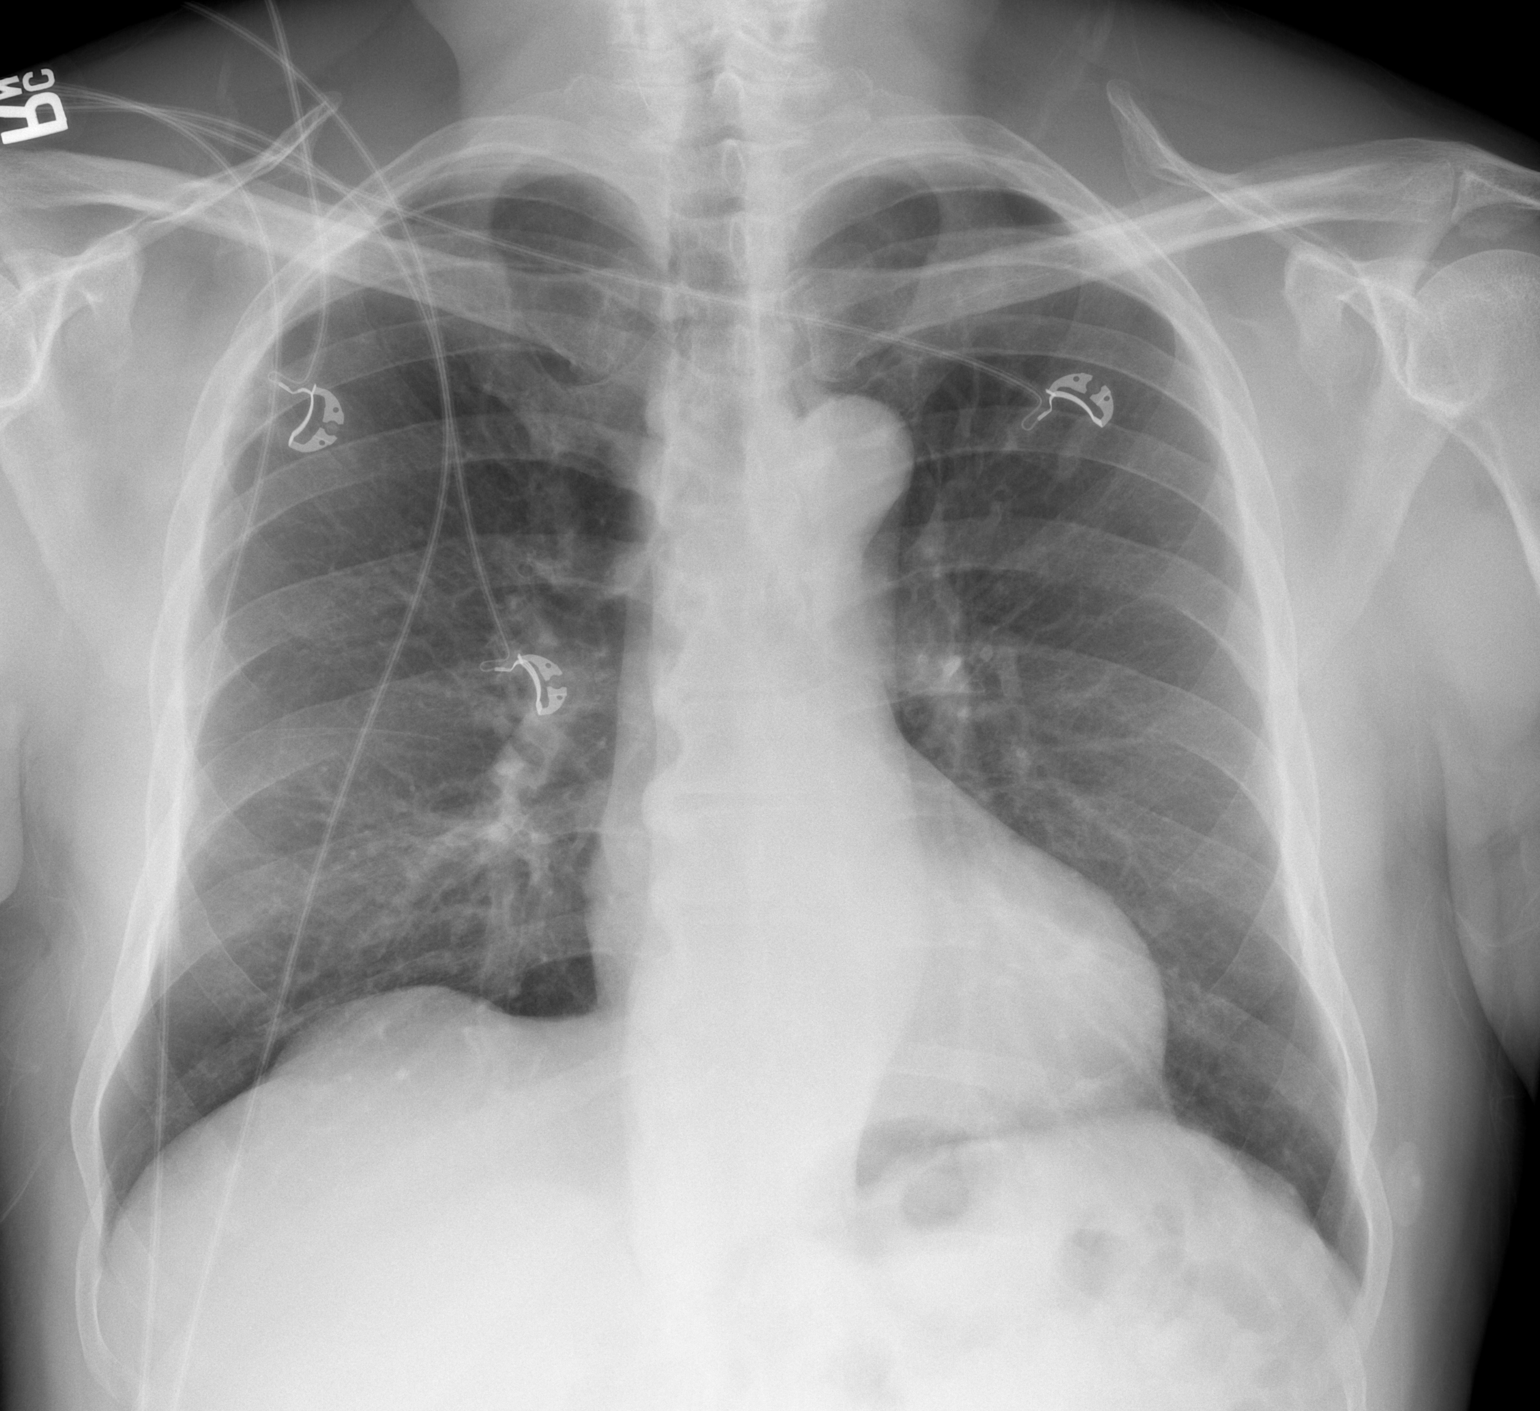

[w chest lat]
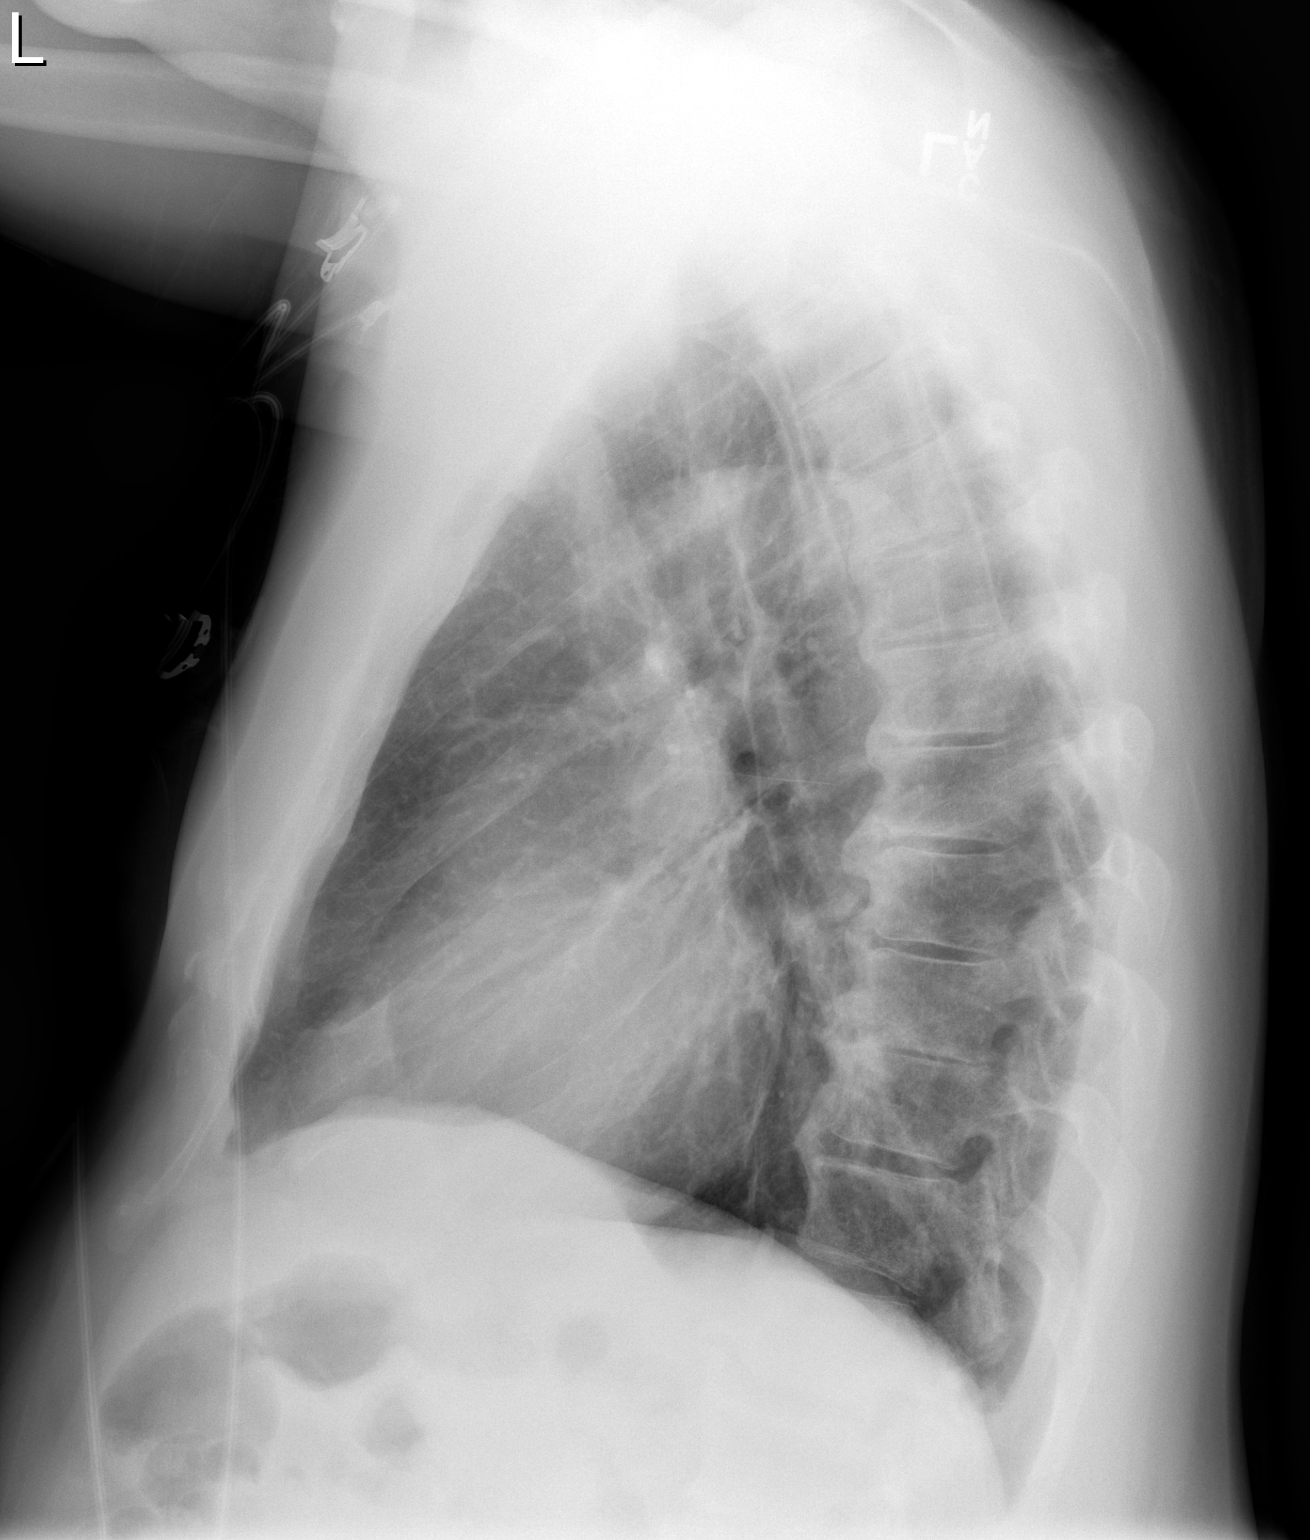

[2 of 2 positions shown; findings below may reference images not displayed]

FINDINGS: The lungs are clear without focal pneumonia, edema, pneumothorax or
pleural effusion. The cardiopericardial silhouette is within normal
limits for size. Imaged bony structures of the thorax are intact.
Telemetry leads overlie the chest.
IMPRESSION: No active cardiopulmonary disease.

## 2017-01-11 ENCOUNTER — Emergency Department (HOSPITAL_BASED_OUTPATIENT_CLINIC_OR_DEPARTMENT_OTHER)
Admission: EM | Admit: 2017-01-11 | Discharge: 2017-01-11 | Disposition: A | Discharge status: Home / Self Care | Payer: Medicare Other | Attending: Emergency Medicine | Admitting: Emergency Medicine

## 2017-01-11 ENCOUNTER — Emergency Department (HOSPITAL_BASED_OUTPATIENT_CLINIC_OR_DEPARTMENT_OTHER): Payer: Medicare Other

## 2017-01-11 ENCOUNTER — Encounter (HOSPITAL_BASED_OUTPATIENT_CLINIC_OR_DEPARTMENT_OTHER): Payer: Self-pay

## 2017-01-11 DIAGNOSIS — J449 Chronic obstructive pulmonary disease, unspecified: Secondary | ICD-10-CM | POA: Insufficient documentation

## 2017-01-11 DIAGNOSIS — R202 Paresthesia of skin: Secondary | ICD-10-CM | POA: Diagnosis not present

## 2017-01-11 DIAGNOSIS — I10 Essential (primary) hypertension: Secondary | ICD-10-CM | POA: Insufficient documentation

## 2017-01-11 DIAGNOSIS — Z79899 Other long term (current) drug therapy: Secondary | ICD-10-CM | POA: Diagnosis not present

## 2017-01-11 DIAGNOSIS — R0789 Other chest pain: Secondary | ICD-10-CM | POA: Diagnosis not present

## 2017-01-11 DIAGNOSIS — R079 Chest pain, unspecified: Secondary | ICD-10-CM | POA: Diagnosis present

## 2017-01-11 LAB — CBC
HCT: 41.8 % (ref 39.0–52.0)
Hemoglobin: 14.2 g/dL (ref 13.0–17.0)
MCH: 28.3 pg (ref 26.0–34.0)
MCHC: 34 g/dL (ref 30.0–36.0)
MCV: 83.3 fL (ref 78.0–100.0)
Platelets: 219 10*3/uL (ref 150–400)
RBC: 5.02 MIL/uL (ref 4.22–5.81)
RDW: 13.1 % (ref 11.5–15.5)
WBC: 7.2 10*3/uL (ref 4.0–10.5)

## 2017-01-11 LAB — TROPONIN I: Troponin I: 0.03 ng/mL (ref <0.03)

## 2017-01-11 LAB — BASIC METABOLIC PANEL
Anion gap: 6 (ref 5–15)
BUN: 14 mg/dL (ref 6–20)
CO2: 28 mmol/L (ref 22–32)
Calcium: 9.4 mg/dL (ref 8.9–10.3)
Chloride: 105 mmol/L (ref 101–111)
Creatinine, Ser: 1.33 mg/dL — ABNORMAL HIGH (ref 0.61–1.24)
GFR calc Af Amer: >60 mL/min (ref >60)
GFR calc non Af Amer: 58 mL/min — ABNORMAL LOW (ref >60)
Glucose, Bld: 106 mg/dL — ABNORMAL HIGH (ref 65–99)
Potassium: 4.4 mmol/L (ref 3.5–5.1)
Sodium: 139 mmol/L (ref 135–145)

## 2017-01-11 MED ORDER — ALUM & MAG HYDROXIDE-SIMETH 200-200-20 MG/5ML PO SUSP
30.0000 mL | Freq: Once | ORAL | Status: AC
  Administered 2017-01-11: 30 mL via ORAL
  Filled 2017-01-11: qty 30

## 2017-01-11 MED ORDER — PREDNISONE 50 MG PO TABS
60.0000 mg | ORAL_TABLET | Freq: Once | ORAL | Status: AC
  Administered 2017-01-11: 07:00:00 60 mg via ORAL
  Filled 2017-01-11: qty 1

## 2017-01-11 NOTE — ED Provider Notes (Signed)
[facility] DEPT [facility] Provider Note   CSN: [id number] Arrival date & time: 01/11/17  0506     History   Chief Complaint Chief Complaint  Patient presents with  . Chest Pain    HPI [name] is a 56 y.o. male.  HPI Patient is a 56 year old male who presents the emergency department with intermittent sharp pain across his chest and his bilateral arms over the past 24 hours.  He states the episodes last for about 2 or 3 minutes and resolved.  They can occur at rest.  He denies back pain.  No shortness of breath.  No recent cough or fevers or chills.  Denies abdominal pain.  He reports a fullness sensation in his left side of his face.  Reports some nausea without vomiting.  Denies syncope.  No palpitations.  History of paroxysmal atrial fibrillation on chronic anticoagulation.  He did undergo an ablation for his A. fib this year and is in sinus rhythm since the ablation.  He has a history of heart catheterization 2014 demonstrated normal coronary arteries (see below)   Left Heart Cath 03/2013 1. Left main: Angiographically normal.  2. LAD: Small vessel which does not reach the apex of the heart. It gives  rise to two diagonal branches. It is angiographically normal.  3. Circumflex: Moderate size vessel giving rise to two obtuse marginals.  It is angiographically normal.  4. RCA: Large, dominant vessel with a high anterior takeoff. The vessel  was tortuous, but angiographically normal.  LV: Limited due to ectopy. EF is normal. Probable LVH. IMPRESSION/RECOMMENDATIONS:  1. Angiographically normal coronary arteries. Suspect noncardiac etiology  to this chest discomfort.  2. Normal left ventricular size and systolic function.  3. No aortic stenosis or mitral regurgitation.  Past Medical History:  Diagnosis Date  . Chest pain    a. 11/2002 Cath: EF 68%, LM nl, LAD small, nl, LCX nl, RCA tortuous/nl;  b. 08/2011 St Echo: EF 60%, normal contractility, no scar/ischemia.  Marland Kitchen  COPD (chronic obstructive pulmonary disease) ([place])   . Enlarged prostate   . Hypertension   . Sickle cell trait ([place])   . Sleep apnea   . SVT (supraventricular tachycardia) [facility])     Patient Active Problem List   Diagnosis Date Noted  . Abdominal pain 12/05/2013  . Diarrhea 12/05/2013  . Hypokalemia 12/05/2013  . Chest pain- atypical. Normal LHC 03/2013 03/10/2013  . HTN (hypertension) 03/10/2013  . Nonspecific abnormal electrocardiogram (ECG) (EKG) 03/10/2013    Past Surgical History:  Procedure Laterality Date  . LEFT HEART CATHETERIZATION WITH CORONARY ANGIOGRAM N/A 03/11/2013   Procedure: LEFT HEART CATHETERIZATION WITH CORONARY ANGIOGRAM;  Surgeon: Jolaine Artist, MD;  Location: [facility] CATH LAB;  Service: Cardiovascular;  Laterality: N/A;  . TONSILLECTOMY    . TRANSURETHRAL RESECTION OF PROSTATE         Home Medications    Prior to Admission medications   Medication Sig Start Date End Date Taking? Authorizing Provider  amLODipine (NORVASC) 10 MG tablet Take 1 tablet (10 mg total) by mouth daily. 02/07/15   Charlesetta Shanks, MD  amLODipine (NORVASC) 5 MG tablet TAKE 1 TABLET BY MOUTH EVERY DAY 12/13/14   Barrett, Evelene Croon, PA-C  HYDROcodone-acetaminophen (NORCO/VICODIN) 5-325 MG tablet Take 1-2 tablets by mouth every 4 (four) hours as needed for moderate pain or severe pain. 02/07/15   Charlesetta Shanks, MD  isosorbide mononitrate (IMDUR) 30 MG 24 hr tablet TAKE 1 TABLET BY MOUTH EVERY DAY 04/13/14  Satira Sark, MD  isosorbide mononitrate (IMDUR) 60 MG 24 hr tablet Take 1 tablet (60 mg total) by mouth daily. 02/07/15   Charlesetta Shanks, MD  naproxen (NAPROSYN) 375 MG tablet Take 1 tablet (375 mg total) by mouth 2 (two) times daily with a meal. 12/08/13   Barrett, Evelene Croon, PA-C  nitroGLYCERIN (NITROSTAT) 0.4 MG SL tablet Place 1 tablet (0.4 mg total) under the tongue every 5 (five) minutes as needed for chest pain. 03/12/13   Thurnell Lose, MD  omeprazole (PRILOSEC) 40 MG  capsule Take 40 mg by mouth daily.    [provider]  oxyCODONE (OXY IR/ROXICODONE) 5 MG immediate release tablet Take 1 tablet (5 mg total) by mouth every 6 (six) hours as needed for moderate pain or severe pain. 03/12/13   Thurnell Lose, MD  tamsulosin (FLOMAX) 0.4 MG CAPS capsule Take 0.4 mg by mouth daily.    [provider]  tiotropium (SPIRIVA) 18 MCG inhalation capsule Place 18 mcg into inhaler and inhale daily.    [provider]  triamterene-hydrochlorothiazide (DYAZIDE) 37.5-25 MG per capsule Take 1 each (1 capsule total) by mouth daily. 12/11/13   Barrett, Evelene Croon, PA-C    Family History Family History  Problem Relation Age of Onset  . CAD Mother        died in her 74's - MI  . Multiple myeloma Brother   . Sickle cell anemia Father        died @ 73    Social History Social History  Substance Use Topics  . Smoking status: Never Smoker  . Smokeless tobacco: Never Used  . Alcohol use No     Allergies   Patient has no known allergies.   Review of Systems Review of Systems  All other systems reviewed and are negative.    Physical Exam Updated Vital Signs Ht _0  (1.803 m)   Wt 93 kg (205 lb)   BMI 28.59 kg/m   Physical Exam  Constitutional: He is oriented to person, place, and time. He appears well-developed and well-nourished.  HENT:  Head: Normocephalic and atraumatic.  Eyes: EOM are normal.  Neck: Normal range of motion.  Cardiovascular: Normal rate, regular rhythm, normal heart sounds and intact distal pulses.   Pulmonary/Chest: Effort normal and breath sounds normal. No respiratory distress.  Abdominal: Soft. He exhibits no distension. There is no tenderness.  Musculoskeletal: Normal range of motion.  Full range of motion bilateral shoulders, elbows, wrists.  No rashes noted of his upper extremities.  Normal radial pulses present bilaterally  Neurological: He is alert and oriented to person, place, and time.  Skin: Skin  is warm and dry.  Psychiatric: He has a normal mood and affect. Judgment normal.  Nursing note and vitals reviewed.    ED Treatments / Results  Labs (all labs ordered are listed, but only abnormal results are displayed) Labs Reviewed  BASIC METABOLIC PANEL - Abnormal; Notable for the following:       Result Value   Glucose, Bld 106 (*)    Creatinine, Ser 1.33 (*)    GFR calc non Af Amer 58 (*)    All other components within normal limits  TROPONIN I - Abnormal; Notable for the following:    Troponin I 0.03 (*)    All other components within normal limits  CBC    EKG  EKG Interpretation  Date/Time:  Saturday January 11 2017 05:14:42 EDT Ventricular Rate:  63 PR Interval:  QRS Duration: 116 QT Interval:  419 QTC Calculation: 429 R Axis:   -61 Text Interpretation:  Sinus rhythm Borderline prolonged PR interval Left anterior fascicular block Nonspecific T abnormalities, inferior leads No significant change was found Confirmed by Jola Schmidt [phone]) on 01/11/2017 5:17:11 AM       Radiology Dg Chest 2 View  Result Date: 01/11/2017 CLINICAL DATA:  Sharp chest pain beginning last night at 6 p.m. Radiates to both arms and to the left jaw. Nausea and dizziness. EXAM: CHEST  2 VIEW COMPARISON:  05/20/2016 FINDINGS: Normal heart size and pulmonary vascularity. No focal airspace disease or consolidation in the lungs. No blunting of costophrenic angles. No pneumothorax. Mediastinal contours appear intact. Degenerative changes in the spine. IMPRESSION: No active cardiopulmonary disease. Electronically Signed   By: Lucienne Capers M.D.   On: 01/11/2017 05:42    Procedures Procedures (including critical care time)  Medications Ordered in ED Medications  alum & mag hydroxide-simeth (MAALOX/MYLANTA) 200-200-20 MG/5ML suspension 30 mL (30 mLs Oral Given 01/11/17 0601)  predniSONE (DELTASONE) tablet 60 mg (60 mg Oral Given 01/11/17 8651)    Patient is overall well-appearing.  This  likely represents esophageal spasm versus GERD.  Doubt PE.  Doubt ACS.  Normal coronary arteries in 2014.  Intermittent symptoms.  Doubt dissection.   Initial Impression / Assessment and Plan / ED Course  I have reviewed the triage vital signs and the nursing notes.  Pertinent labs & imaging results that were available during my care of the patient were reviewed by me and considered in my medical decision making (see chart for details).     I personally performed the services described in this documentation, which was scribed in my presence. The recorded information has been reviewed and is accurate.    Final Clinical Impressions(s) / ED Diagnoses   Final diagnoses:  None    New Prescriptions New Prescriptions   No medications on file     Jola Schmidt, MD 01/11/17 [phone]

## 2017-01-11 NOTE — ED Notes (Signed)
Patient transported to X-ray 

## 2017-01-11 NOTE — ED Triage Notes (Signed)
Pt reports sharp chest pain that began last night around 6pm, pain went away and started again this morning. Pain radiates to bilat arms and into his left jaw. Pt reports nausea and dizziness, denies vomiting.

## 2017-01-11 NOTE — ED Notes (Signed)
ED Provider at bedside. 

## 2017-03-30 ENCOUNTER — Other Ambulatory Visit: Payer: Self-pay

## 2017-03-30 ENCOUNTER — Encounter (HOSPITAL_BASED_OUTPATIENT_CLINIC_OR_DEPARTMENT_OTHER): Payer: Self-pay | Admitting: Emergency Medicine

## 2017-03-30 ENCOUNTER — Emergency Department (HOSPITAL_BASED_OUTPATIENT_CLINIC_OR_DEPARTMENT_OTHER)
Admission: EM | Admit: 2017-03-30 | Discharge: 2017-03-30 | Disposition: A | Discharge status: Home / Self Care | Payer: Medicare Other | Attending: Emergency Medicine | Admitting: Emergency Medicine

## 2017-03-30 ENCOUNTER — Emergency Department (HOSPITAL_BASED_OUTPATIENT_CLINIC_OR_DEPARTMENT_OTHER): Payer: Medicare Other

## 2017-03-30 DIAGNOSIS — J441 Chronic obstructive pulmonary disease with (acute) exacerbation: Secondary | ICD-10-CM | POA: Diagnosis not present

## 2017-03-30 DIAGNOSIS — R0602 Shortness of breath: Secondary | ICD-10-CM | POA: Diagnosis not present

## 2017-03-30 DIAGNOSIS — Z79899 Other long term (current) drug therapy: Secondary | ICD-10-CM | POA: Diagnosis not present

## 2017-03-30 DIAGNOSIS — I10 Essential (primary) hypertension: Secondary | ICD-10-CM | POA: Insufficient documentation

## 2017-03-30 DIAGNOSIS — Z7901 Long term (current) use of anticoagulants: Secondary | ICD-10-CM | POA: Diagnosis not present

## 2017-03-30 DIAGNOSIS — J209 Acute bronchitis, unspecified: Secondary | ICD-10-CM

## 2017-03-30 DIAGNOSIS — R05 Cough: Secondary | ICD-10-CM | POA: Diagnosis present

## 2017-03-30 LAB — BASIC METABOLIC PANEL
Anion gap: 8 (ref 5–15)
BUN: 14 mg/dL (ref 6–20)
CO2: 27 mmol/L (ref 22–32)
Calcium: 9.2 mg/dL (ref 8.9–10.3)
Chloride: 106 mmol/L (ref 101–111)
Creatinine, Ser: 1.52 mg/dL — ABNORMAL HIGH (ref 0.61–1.24)
GFR calc Af Amer: 57 mL/min — ABNORMAL LOW (ref >60)
GFR calc non Af Amer: 49 mL/min — ABNORMAL LOW (ref >60)
Glucose, Bld: 120 mg/dL — ABNORMAL HIGH (ref 65–99)
Potassium: 3.4 mmol/L — ABNORMAL LOW (ref 3.5–5.1)
Sodium: 141 mmol/L (ref 135–145)

## 2017-03-30 LAB — CBC WITH DIFFERENTIAL/PLATELET
Basophils Absolute: 0 10*3/uL (ref 0.0–0.1)
Basophils Relative: 0 %
Eosinophils Absolute: 0.3 10*3/uL (ref 0.0–0.7)
Eosinophils Relative: 3 %
HCT: 48.3 % (ref 39.0–52.0)
Hemoglobin: 16.3 g/dL (ref 13.0–17.0)
Lymphocytes Relative: 26 %
Lymphs Abs: 2.2 10*3/uL (ref 0.7–4.0)
MCH: 28.2 pg (ref 26.0–34.0)
MCHC: 33.7 g/dL (ref 30.0–36.0)
MCV: 83.7 fL (ref 78.0–100.0)
Monocytes Absolute: 1 10*3/uL (ref 0.1–1.0)
Monocytes Relative: 12 %
Neutro Abs: 5.1 10*3/uL (ref 1.7–7.7)
Neutrophils Relative %: 59 %
Platelets: 183 10*3/uL (ref 150–400)
RBC: 5.77 MIL/uL (ref 4.22–5.81)
RDW: 13.4 % (ref 11.5–15.5)
WBC: 8.6 10*3/uL (ref 4.0–10.5)

## 2017-03-30 LAB — BRAIN NATRIURETIC PEPTIDE: B Natriuretic Peptide: 37.5 pg/mL (ref 0.0–100.0)

## 2017-03-30 LAB — TROPONIN I: Troponin I: <0.03 ng/mL (ref <0.03)

## 2017-03-30 MED ORDER — BENZONATATE 100 MG PO CAPS: 100.0000 mg | ORAL_CAPSULE | Freq: Three times a day (TID) | ORAL | 0 refills | Status: DC

## 2017-03-30 MED ORDER — AZITHROMYCIN 250 MG PO TABS
500.0000 mg | ORAL_TABLET | Freq: Once | ORAL | Status: AC
  Administered 2017-03-30: 500 mg via ORAL
  Filled 2017-03-30: qty 2

## 2017-03-30 MED ORDER — METHYLPREDNISOLONE SODIUM SUCC 125 MG IJ SOLR
125.0000 mg | Freq: Once | INTRAMUSCULAR | Status: AC
Start: 2017-03-30 — End: 2017-03-30
  Administered 2017-03-30: 125 mg via INTRAVENOUS
  Filled 2017-03-30: qty 2

## 2017-03-30 MED ORDER — AZITHROMYCIN 250 MG PO TABS
ORAL_TABLET | ORAL | 0 refills | Status: DC
Start: 2017-03-31 — End: 2017-04-04

## 2017-03-30 MED ORDER — SALINE SPRAY 0.65 % NA SOLN
1.0000 | Freq: Once | NASAL | Status: AC
Start: 2017-03-30 — End: 2017-03-30
  Administered 2017-03-30: 1 via NASAL
  Filled 2017-03-30 (×2): qty 44

## 2017-03-30 MED ORDER — PREDNISONE 10 MG (21) PO TBPK: ORAL_TABLET | ORAL | 0 refills | Status: DC

## 2017-03-30 MED ORDER — ALBUTEROL (5 MG/ML) CONTINUOUS INHALATION SOLN
10.0000 mg/h | INHALATION_SOLUTION | RESPIRATORY_TRACT | Status: DC
  Administered 2017-03-30: 10 mg/h via RESPIRATORY_TRACT
  Filled 2017-03-30: qty 20

## 2017-03-30 NOTE — ED Provider Notes (Signed)
[place] EMERGENCY DEPARTMENT Provider Note   CSN: [id number] Arrival date & time: 03/30/17  1106     History   Chief Complaint Chief Complaint  Patient presents with  . Cough    HPI [name] is a 56 y.o. male.  Pt presents to the ED today with sob and cough.  He has used otc robitussin without relief of sx.  Pt feels like he has a lot of sinus pressure and has been coughing up phlegm.  The pt has a hx of COPD probably from chemicals he was exposed to.  He has never smoked.  He denies fevers, but felt hot last night.      Past Medical History:  Diagnosis Date  . Chest pain    a. 11/2002 Cath: EF 68%, LM nl, LAD small, nl, LCX nl, RCA tortuous/nl;  b. 08/2011 St Echo: EF 60%, normal contractility, no scar/ischemia.  Marland Kitchen COPD (chronic obstructive pulmonary disease) ([place])   . Enlarged prostate   . Hypertension   . Sickle cell trait ([place])   . Sleep apnea   . SVT (supraventricular tachycardia) [facility])     Patient Active Problem List   Diagnosis Date Noted  . Abdominal pain 12/05/2013  . Diarrhea 12/05/2013  . Hypokalemia 12/05/2013  . Chest pain- atypical. Normal LHC 03/2013 03/10/2013  . HTN (hypertension) 03/10/2013  . Nonspecific abnormal electrocardiogram (ECG) (EKG) 03/10/2013    Past Surgical History:  Procedure Laterality Date  . LEFT HEART CATHETERIZATION WITH CORONARY ANGIOGRAM N/A 03/11/2013   Procedure: LEFT HEART CATHETERIZATION WITH CORONARY ANGIOGRAM;  Surgeon: Jolaine Artist, MD;  Location: [facility] CATH LAB;  Service: Cardiovascular;  Laterality: N/A;  . TONSILLECTOMY    . TRANSURETHRAL RESECTION OF PROSTATE         Home Medications    Prior to Admission medications   Medication Sig Start Date End Date Taking? Authorizing Provider  amLODipine (NORVASC) 10 MG tablet Take 1 tablet (10 mg total) by mouth daily. 02/07/15   Charlesetta Shanks, MD  amLODipine (NORVASC) 5 MG tablet TAKE 1 TABLET BY MOUTH EVERY DAY 12/13/14   Barrett, Evelene Croon,  PA-C  apixaban (ELIQUIS) 2.5 MG TABS tablet Take 2.5 mg by mouth 2 (two) times daily.    [provider]  azithromycin (ZITHROMAX) 250 MG tablet Take 1 every day until finished. 03/31/17   Isla Pence, MD  benzonatate (TESSALON) 100 MG capsule Take 1 capsule (100 mg total) by mouth every 8 (eight) hours. 03/30/17   Isla Pence, MD  hydrochlorothiazide (HYDRODIURIL) 25 MG tablet Take by mouth daily.    [provider]  HYDROcodone-acetaminophen (NORCO/VICODIN) 5-325 MG tablet Take 1-2 tablets by mouth every 4 (four) hours as needed for moderate pain or severe pain. 02/07/15   Charlesetta Shanks, MD  isosorbide mononitrate (IMDUR) 30 MG 24 hr tablet TAKE 1 TABLET BY MOUTH EVERY DAY 04/13/14   Satira Sark, MD  isosorbide mononitrate (IMDUR) 60 MG 24 hr tablet Take 1 tablet (60 mg total) by mouth daily. 02/07/15   Charlesetta Shanks, MD  metoprolol tartrate (LOPRESSOR) 50 MG tablet Take 25 mg by mouth 2 (two) times daily.    [provider]  naproxen (NAPROSYN) 375 MG tablet Take 1 tablet (375 mg total) by mouth 2 (two) times daily with a meal. 12/08/13   Barrett, Evelene Croon, PA-C  nitroGLYCERIN (NITROSTAT) 0.4 MG SL tablet Place 1 tablet (0.4 mg total) under the tongue every 5 (five) minutes as needed for chest pain.  03/12/13   Thurnell Lose, MD  omeprazole (PRILOSEC) 40 MG capsule Take 40 mg by mouth daily.    [provider]  oxyCODONE (OXY IR/ROXICODONE) 5 MG immediate release tablet Take 1 tablet (5 mg total) by mouth every 6 (six) hours as needed for moderate pain or severe pain. 03/12/13   Thurnell Lose, MD  predniSONE (STERAPRED UNI-PAK 21 TAB) 10 MG (21) TBPK tablet Take 6 tabs by mouth daily  for 2 days, then 5 tabs for 2 days, then 4 tabs for 2 days, then 3 tabs for 2 days, 2 tabs for 2 days, then 1 tab by mouth daily for 2 days 03/30/17   Isla Pence, MD  tamsulosin (FLOMAX) 0.4 MG CAPS capsule Take 0.4 mg by mouth daily.    [provider]  tiotropium (SPIRIVA) 18 MCG inhalation capsule Place 18 mcg into inhaler and inhale daily.    [provider]  triamterene-hydrochlorothiazide (DYAZIDE) 37.5-25 MG per capsule Take 1 each (1 capsule total) by mouth daily. 12/11/13   Barrett, Evelene Croon, PA-C    Family History Family History  Problem Relation Age of Onset  . CAD Mother        died in her 56's - MI  . Multiple myeloma Brother   . Sickle cell anemia Father        died @ 55    Social History Social History   Tobacco Use  . Smoking status: Never Smoker  . Smokeless tobacco: Never Used  Substance Use Topics  . Alcohol use: No  . Drug use: No     Allergies   Patient has no known allergies.   Review of Systems Review of Systems  HENT: Positive for congestion.   Respiratory: Positive for cough, shortness of breath and wheezing.   All other systems reviewed and are negative.    Physical Exam Updated Vital Signs BP 116/85   Pulse 70   Temp 99.2 F (37.3 C) (Oral)   Resp 20   Ht 6' (1.829 m)   Wt 93 kg (205 lb)   SpO2 98%   BMI 27.80 kg/m   Physical Exam  Constitutional: He is oriented to person, place, and time. He appears well-developed and well-nourished.  HENT:  Head: Normocephalic and atraumatic.  Right Ear: External ear normal.  Left Ear: External ear normal.  Nose: Nose normal.  Mouth/Throat: Oropharynx is clear and moist.  Eyes: Conjunctivae and EOM are normal. Pupils are equal, round, and reactive to light.  Neck: Normal range of motion. Neck supple.  Cardiovascular: Normal rate, regular rhythm, normal heart sounds and intact distal pulses.  Pulmonary/Chest: He has wheezes.  Abdominal: Soft. Bowel sounds are normal.  Musculoskeletal: Normal range of motion.  Neurological: He is alert and oriented to person, place, and time.  Skin: Skin is warm and dry. Capillary refill takes less than 2 seconds.  Psychiatric: He has a normal mood and affect. His behavior is normal. Judgment  and thought content normal.  Nursing note and vitals reviewed.    ED Treatments / Results  Labs (all labs ordered are listed, but only abnormal results are displayed) Labs Reviewed  BASIC METABOLIC PANEL - Abnormal; Notable for the following components:      Result Value   Potassium 3.4 (*)    Glucose, Bld 120 (*)    Creatinine, Ser 1.52 (*)    GFR calc non Af Amer 49 (*)    GFR calc Af Amer 57 (*)  All other components within normal limits  CBC WITH DIFFERENTIAL/PLATELET  BRAIN NATRIURETIC PEPTIDE  TROPONIN I    EKG  EKG Interpretation  Date/Time:  _0 /23/18 1259       Isla Pence, MD 03/30/17 1300

## 2017-03-30 NOTE — ED Triage Notes (Signed)
Patient states that he has had COPD in the past, reports that for the last week he has had a cough, congestion and SOB - Noted cough in triage. The patient also reports chest tightness

## 2017-04-04 ENCOUNTER — Observation Stay (HOSPITAL_COMMUNITY)
Admission: EM | Admit: 2017-04-04 | Discharge: 2017-04-05 | Disposition: A | Discharge status: Home / Self Care | Payer: Medicare Other | Attending: Oncology | Admitting: Oncology

## 2017-04-04 ENCOUNTER — Encounter (HOSPITAL_COMMUNITY): Payer: Self-pay | Admitting: Emergency Medicine

## 2017-04-04 ENCOUNTER — Other Ambulatory Visit: Payer: Self-pay

## 2017-04-04 ENCOUNTER — Emergency Department (HOSPITAL_COMMUNITY): Payer: Medicare Other

## 2017-04-04 DIAGNOSIS — R0789 Other chest pain: Secondary | ICD-10-CM | POA: Diagnosis present

## 2017-04-04 DIAGNOSIS — G8929 Other chronic pain: Secondary | ICD-10-CM

## 2017-04-04 DIAGNOSIS — Z8249 Family history of ischemic heart disease and other diseases of the circulatory system: Secondary | ICD-10-CM

## 2017-04-04 DIAGNOSIS — I4589 Other specified conduction disorders: Secondary | ICD-10-CM

## 2017-04-04 DIAGNOSIS — Z79899 Other long term (current) drug therapy: Secondary | ICD-10-CM | POA: Diagnosis not present

## 2017-04-04 DIAGNOSIS — I10 Essential (primary) hypertension: Secondary | ICD-10-CM | POA: Diagnosis not present

## 2017-04-04 DIAGNOSIS — J449 Chronic obstructive pulmonary disease, unspecified: Secondary | ICD-10-CM | POA: Diagnosis not present

## 2017-04-04 DIAGNOSIS — K219 Gastro-esophageal reflux disease without esophagitis: Secondary | ICD-10-CM | POA: Diagnosis not present

## 2017-04-04 DIAGNOSIS — I482 Chronic atrial fibrillation: Secondary | ICD-10-CM

## 2017-04-04 DIAGNOSIS — I447 Left bundle-branch block, unspecified: Secondary | ICD-10-CM

## 2017-04-04 DIAGNOSIS — E876 Hypokalemia: Secondary | ICD-10-CM

## 2017-04-04 DIAGNOSIS — Z7901 Long term (current) use of anticoagulants: Secondary | ICD-10-CM

## 2017-04-04 DIAGNOSIS — R079 Chest pain, unspecified: Secondary | ICD-10-CM

## 2017-04-04 LAB — I-STAT TROPONIN, ED: Troponin i, poc: 0 ng/mL (ref 0.00–0.08)

## 2017-04-04 LAB — HEMOGLOBIN A1C
Hgb A1c MFr Bld: 6.1 % — ABNORMAL HIGH (ref 4.8–5.6)
Mean Plasma Glucose: 128.37 mg/dL

## 2017-04-04 LAB — CBC
HCT: 38.1 % — ABNORMAL LOW (ref 39.0–52.0)
Hemoglobin: 12.6 g/dL — ABNORMAL LOW (ref 13.0–17.0)
MCH: 27.6 pg (ref 26.0–34.0)
MCHC: 33.1 g/dL (ref 30.0–36.0)
MCV: 83.6 fL (ref 78.0–100.0)
Platelets: 285 10*3/uL (ref 150–400)
RBC: 4.56 MIL/uL (ref 4.22–5.81)
RDW: 12.8 % (ref 11.5–15.5)
WBC: 17 10*3/uL — ABNORMAL HIGH (ref 4.0–10.5)

## 2017-04-04 LAB — BASIC METABOLIC PANEL
Anion gap: 5 (ref 5–15)
BUN: 12 mg/dL (ref 6–20)
CO2: 28 mmol/L (ref 22–32)
Calcium: 8.8 mg/dL — ABNORMAL LOW (ref 8.9–10.3)
Chloride: 107 mmol/L (ref 101–111)
Creatinine, Ser: 1.27 mg/dL — ABNORMAL HIGH (ref 0.61–1.24)
GFR calc Af Amer: >60 mL/min (ref >60)
GFR calc non Af Amer: >60 mL/min (ref >60)
Glucose, Bld: 93 mg/dL (ref 65–99)
Potassium: 3.1 mmol/L — ABNORMAL LOW (ref 3.5–5.1)
Sodium: 140 mmol/L (ref 135–145)

## 2017-04-04 LAB — TSH: TSH: 0.514 u[IU]/mL (ref 0.350–4.500)

## 2017-04-04 LAB — TROPONIN I: Troponin I: <0.03 ng/mL (ref <0.03)

## 2017-04-04 MED ORDER — GABAPENTIN 300 MG PO CAPS
300.0000 mg | ORAL_CAPSULE | Freq: Every day | ORAL | Status: DC
  Filled 2017-04-04: qty 1

## 2017-04-04 MED ORDER — IPRATROPIUM-ALBUTEROL 0.5-2.5 (3) MG/3ML IN SOLN
3.0000 mL | Freq: Once | RESPIRATORY_TRACT | Status: AC
  Administered 2017-04-04: 3 mL via RESPIRATORY_TRACT
  Filled 2017-04-04: qty 3

## 2017-04-04 MED ORDER — TAMSULOSIN HCL 0.4 MG PO CAPS
0.4000 mg | ORAL_CAPSULE | Freq: Every day | ORAL | Status: DC
  Administered 2017-04-05: 0.4 mg via ORAL
  Filled 2017-04-04: qty 1

## 2017-04-04 MED ORDER — ACETAMINOPHEN 325 MG PO TABS: 650.0000 mg | ORAL_TABLET | Freq: Four times a day (QID) | ORAL | Status: DC | PRN

## 2017-04-04 MED ORDER — PANTOPRAZOLE SODIUM 40 MG PO TBEC
40.0000 mg | DELAYED_RELEASE_TABLET | Freq: Every day | ORAL | Status: DC
Start: 2017-04-04 — End: 2017-04-05
  Administered 2017-04-04 – 2017-04-05 (×2): 40 mg via ORAL
  Filled 2017-04-04 (×2): qty 1

## 2017-04-04 MED ORDER — METOPROLOL SUCCINATE 12.5 MG HALF TABLET
12.5000 mg | ORAL_TABLET | Freq: Every day | ORAL | Status: DC
  Administered 2017-04-05: 12.5 mg via ORAL
  Filled 2017-04-04: qty 1

## 2017-04-04 MED ORDER — ASPIRIN EC 81 MG PO TBEC
81.0000 mg | DELAYED_RELEASE_TABLET | Freq: Every day | ORAL | Status: DC
  Administered 2017-04-05: 81 mg via ORAL
  Filled 2017-04-04: qty 1

## 2017-04-04 MED ORDER — TRIAMTERENE-HCTZ 37.5-25 MG PO CAPS
1.0000 | ORAL_CAPSULE | Freq: Every day | ORAL | Status: DC
  Administered 2017-04-05: 1 via ORAL
  Filled 2017-04-04: qty 1

## 2017-04-04 MED ORDER — APIXABAN 5 MG PO TABS
5.0000 mg | ORAL_TABLET | Freq: Two times a day (BID) | ORAL | Status: DC
Start: 2017-04-04 — End: 2017-04-05
  Administered 2017-04-04 – 2017-04-05 (×2): 5 mg via ORAL
  Filled 2017-04-04 (×2): qty 1

## 2017-04-04 MED ORDER — POTASSIUM CHLORIDE CRYS ER 20 MEQ PO TBCR
40.0000 meq | EXTENDED_RELEASE_TABLET | Freq: Two times a day (BID) | ORAL | Status: DC
  Administered 2017-04-04 – 2017-04-05 (×2): 40 meq via ORAL
  Filled 2017-04-04 (×2): qty 2

## 2017-04-04 MED ORDER — METOPROLOL TARTRATE 25 MG PO TABS: 25.0000 mg | ORAL_TABLET | Freq: Two times a day (BID) | ORAL | Status: DC

## 2017-04-04 MED ORDER — SENNOSIDES-DOCUSATE SODIUM 8.6-50 MG PO TABS: 1.0000 | ORAL_TABLET | Freq: Every evening | ORAL | Status: DC | PRN

## 2017-04-04 MED ORDER — SOTALOL HCL 80 MG PO TABS
40.0000 mg | ORAL_TABLET | Freq: Two times a day (BID) | ORAL | Status: DC
  Administered 2017-04-04 – 2017-04-05 (×2): 40 mg via ORAL
  Filled 2017-04-04 (×2): qty 0.5

## 2017-04-04 MED ORDER — FENTANYL CITRATE (PF) 100 MCG/2ML IJ SOLN
50.0000 ug | Freq: Once | INTRAMUSCULAR | Status: AC
  Administered 2017-04-04: 50 ug via INTRAVENOUS
  Filled 2017-04-04: qty 2

## 2017-04-04 MED ORDER — AMLODIPINE BESYLATE 5 MG PO TABS
10.0000 mg | ORAL_TABLET | Freq: Every day | ORAL | Status: DC
  Administered 2017-04-05: 10 mg via ORAL
  Filled 2017-04-04: qty 2

## 2017-04-04 MED ORDER — TIOTROPIUM BROMIDE MONOHYDRATE 18 MCG IN CAPS
18.0000 ug | ORAL_CAPSULE | Freq: Every day | RESPIRATORY_TRACT | Status: DC
  Administered 2017-04-05: 18 ug via RESPIRATORY_TRACT
  Filled 2017-04-04: qty 5

## 2017-04-04 MED ORDER — NITROGLYCERIN 0.4 MG SL SUBL
0.4000 mg | SUBLINGUAL_TABLET | SUBLINGUAL | Status: DC | PRN
  Administered 2017-04-04 (×2): 0.4 mg via SUBLINGUAL
  Filled 2017-04-04: qty 1

## 2017-04-04 MED ORDER — ACETAMINOPHEN 650 MG RE SUPP: 650.0000 mg | Freq: Four times a day (QID) | RECTAL | Status: DC | PRN

## 2017-04-04 MED ORDER — NAPROXEN 250 MG PO TABS
375.0000 mg | ORAL_TABLET | Freq: Two times a day (BID) | ORAL | Status: DC | PRN
  Administered 2017-04-05: 375 mg via ORAL
  Filled 2017-04-04: qty 2

## 2017-04-04 NOTE — H&P (Signed)
Date: 04/04/2017               Patient Name:  [name] MRN: [id number]  DOB: February 16, 1961 Age / Sex: 56 y.o., male   PCP: [name], MD         Medical Service: Internal Medicine Teaching Service         Attending Physician: Dr. Annia Belt, MD    First Contact: Dr. Vickki Muff Pager: [phone]  Second Contact: Dr. Einar Gip Pager: [phone]       After Hours (After 5p/  First Contact Pager: [phone]  weekends / holidays): Second Contact Pager: [phone]   Chief Complaint: chest pain, cough  History of Present Illness: 56 yo male with PMH of COPD, Afib, GERD, chronic non cardiac chest pain presented at the request of his PCP for chest pain.  Patient states this all began on Sunday when he developed a cough productive of thick yellow sputum.  He went to an urgent care and was diagnosed with bronchitis was given antibiotics, steroids, and a cough medicine.  He felt these were helping him however on Tuesday morning he noticed that he had some chest pain that lasted 3-4 hours and radiated to his left shoulder it was nonexertional, nonpleuritic, not worse with coughing and nonreproducible, he did not feel as if he was in A. fib at the time he is very sensitive to when he goes in A. fib and can always tell according to him.  He had an additional episode on Wednesday morning and another this morning before his appointment with his PCP.   He states he has had this pain in the past but not since before his atrial fibrillation ablation in July.  He denied any recent fevers, runny nose, nausea/vomiting, diarrhea.    On chart review the patient has had 2 left heart catheterizations most recent in 2015 which showed clean coronaries.  Additionally he has had 2 nuclear stress tests that have been negative the latter being performed this year.  He did have an abnormal echocardiogram performed recently at [facility] where there was concern for hocm physiology with [facility].  The patient  was supposed to follow-up with his cardiologist Dr. Philbert Riser to further work this up however he felt that it was not important enough to go to this appointment.    ED course: Pt arrived with no active chest pain and normal vitals he was in sinus rhythm.  His troponin was negative, he had a white count of 17,000, and a mild hypokalemia.  He had a chest x-ray that was negative for acute processes, and his EKG showed only sinus rhythm.    Meds:  Current Meds  Medication Sig  . amLODipine (NORVASC) 10 MG tablet Take 1 tablet (10 mg total) by mouth daily.  Marland Kitchen apixaban (ELIQUIS) 5 MG TABS tablet Take 5 mg by mouth 2 (two) times daily.  . benzonatate (TESSALON) 100 MG capsule Take 1 capsule (100 mg total) by mouth every 8 (eight) hours.  . cetirizine (ZYRTEC) 10 MG tablet Take 10 mg by mouth daily.  Marland Kitchen gabapentin (NEURONTIN) 300 MG capsule Take 300 mg by mouth at bedtime.  Marland Kitchen ibuprofen (ADVIL,MOTRIN) 600 MG tablet Take 600 mg by mouth every 8 (eight) hours as needed for moderate pain.  . metoprolol tartrate (LOPRESSOR) 50 MG tablet Take 25 mg by mouth 2 (two) times daily.  . naproxen (NAPROSYN) 375 MG tablet Take 1 tablet (375 mg total) by mouth 2 (  two) times daily with a meal. (Patient taking differently: Take 375 mg by mouth 2 (two) times daily as needed for mild pain. )  . nitroGLYCERIN (NITROSTAT) 0.4 MG SL tablet Place 1 tablet (0.4 mg total) under the tongue every 5 (five) minutes as needed for chest pain.  Marland Kitchen omeprazole (PRILOSEC) 40 MG capsule Take 40 mg by mouth daily.  Marland Kitchen oxyCODONE (OXY IR/ROXICODONE) 5 MG immediate release tablet Take 1 tablet (5 mg total) by mouth every 6 (six) hours as needed for moderate pain or severe pain.  Marland Kitchen oxycodone-acetaminophen (LYNOX) 7.5-300 MG tablet Take 1 tablet by mouth every 8 (eight) hours as needed for pain.  . pantoprazole (PROTONIX) 40 MG tablet Take 40 mg by mouth daily.  . predniSONE (STERAPRED UNI-PAK 21 TAB) 10 MG (21) TBPK tablet Take 6 tabs by mouth daily   for 2 days, then 5 tabs for 2 days, then 4 tabs for 2 days, then 3 tabs for 2 days, 2 tabs for 2 days, then 1 tab by mouth daily for 2 days  . sotalol (BETAPACE) 80 MG tablet Take 40 mg by mouth 2 (two) times daily.  . SUMAtriptan (IMITREX) 50 MG tablet Take 50-100 mg by mouth once as needed for migraine.  . tamsulosin (FLOMAX) 0.4 MG CAPS capsule Take 0.4 mg by mouth daily.  Marland Kitchen tiotropium (SPIRIVA) 18 MCG inhalation capsule Place 18 mcg into inhaler and inhale daily.  . traMADol (ULTRAM) 50 MG tablet Take 50 mg by mouth every 6 (six) hours as needed for moderate pain.  Marland Kitchen triamterene-hydrochlorothiazide (DYAZIDE) 37.5-25 MG per capsule Take 1 each (1 capsule total) by mouth daily.  . VESICARE 5 MG tablet Take 5 mg by mouth daily.  . [DISCONTINUED] hydrochlorothiazide (HYDRODIURIL) 25 MG tablet Take 25 mg by mouth daily.      Allergies: Allergies as of 04/04/2017  . (No Known Allergies)   Past Medical History:  Diagnosis Date  . Chest pain    a. 11/2002 Cath: EF 68%, LM nl, LAD small, nl, LCX nl, RCA tortuous/nl;  b. 08/2011 St Echo: EF 60%, normal contractility, no scar/ischemia.  Marland Kitchen COPD (chronic obstructive pulmonary disease) ([place])   . Enlarged prostate   . Hypertension   . Sickle cell trait ([place])   . Sleep apnea   . SVT (supraventricular tachycardia) ([facility])     Family History:  Family History  Problem Relation Age of Onset  . CAD Mother        died in her 11's - MI  . Multiple myeloma Brother   . Sickle cell anemia Father        died @ 85    Social History:  Social History   Socioeconomic History  . Marital status: Divorced    Spouse name: Not on file  . Number of children: Not on file  . Years of education: Not on file  . Highest education level: Not on file  Social Needs  . Financial resource strain: Not on file  . Food insecurity - worry: Not on file  . Food insecurity - inability: Not on file  . Transportation needs - medical: Not on file  . Transportation needs  - non-medical: Not on file  Occupational History  . Not on file  Tobacco Use  . Smoking status: Never Smoker  . Smokeless tobacco: Never Used  Substance and Sexual Activity  . Alcohol use: No  . Drug use: No  . Sexual activity: No    Birth control/protection: None  Other Topics Concern  . Not on file  Social History Narrative   Lives in [place] with dtr.    Review of Systems: A complete ROS was negative except as per HPI.   Physical Exam: Blood pressure (!) 157/106, pulse 61, temperature 97.7 F (36.5 C), temperature source Oral, resp. rate 12, height '5\' 11"'$  (1.803 m), weight 208 lb 4.8 oz (94.5 kg), SpO2 97 %. Physical Exam  Constitutional: He is oriented to person, place, and time. He appears well-developed and well-nourished.  HENT:  Head: Normocephalic and atraumatic.  Eyes: Right eye exhibits no discharge. Left eye exhibits no discharge. No scleral icterus.  Neck: No JVD present.  Cardiovascular: Normal rate, regular rhythm, normal heart sounds and intact distal pulses. Exam reveals no gallop and no friction rub.  No murmur heard. Pulmonary/Chest: Effort normal and breath sounds normal. No accessory muscle usage. No respiratory distress. He has no wheezes. He has no rales.  Abdominal: Soft. Bowel sounds are normal. He exhibits no distension and no mass. There is no tenderness. There is no guarding.  Musculoskeletal:       Right lower leg: He exhibits no edema.       Left lower leg: He exhibits no edema.  Neurological: He is alert and oriented to person, place, and time.  Psychiatric: His mood appears not anxious. He is not agitated.    EKG: personally reviewed my interpretation is sinus rhythm  CXR: personally reviewed my interpretation is no acute cardiopulmonary abnormality  Assessment & Plan by Problem: Active Problems:   Atypical chest pain  Patient Profile: 56 yo male with PMH of COPD, Afib, GERD, chronic non cardiac chest pain presented at the request of his PCP  for chest pain that occurred during the week  Shortly after he was diagnosed and treated for bronchitis    Atypical chest pain: pt with extensive workup for CAD which has all been negative.  There was concern for HOCM/SAM per chart review from pts cardiologist at [facility], however cardiac MRI showed no evidence of this.    -Troponin neg x1 will trend troponins, repeat EKG -continuous cardiac monitoring -has not had any pain since arrival -pain could be from recent respiratory infection, although not pleuritic  Acute on chronic COPD: pt reports recent diagnosis of bronchitis has finished short course of abx and steroids, still has cough but says feels much better since Sunday.  Chest x-ray here in ED clear, does have increased white count but afebrile, had short course of steroids from urgent care, no hypoxia.  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  He takes spiriva daily at home.    -continue home spiriva -supportive care for now  Afib: chronic, has remained in SR for the most part since ablation in July.  On metoprolol succinate 12.'5mg'$ , sotalol '80mg'$ ,  eliquis  -continue home meds   Dispo: Admit patient to Observation with expected length of stay less than 2 midnights.  Signed: Katherine Roan, MD 04/04/2017, 7:51 PM  Vickki Muff MD PGY-1 Internal Medicine Pager # [phone]

## 2017-04-04 NOTE — Care Management Note (Signed)
Case Management Note  Patient Details  Name: [name] MRN: [id number] Date of Birth: 1960-12-28  Subjective/Objective:                  Chest Pain  Action/Plan: CM spoke with the patient at the bedside. Patient states his daughter lives with him at home. He denies any difficulty with transportation to physician appointments or affording medications. Reports he is supposed to use a CPAP but did not comply with the requirements in the past. States he does plan to discuss this with his PCP since he will need to have a sleep study again. Unit CM to follow for discharge needs.   Expected Discharge Date:   unknown           Expected Discharge Plan:  Home/Self Care  In-House Referral:     Discharge planning Services  CM Consult  Post Acute Care Choice:    Choice offered to:     DME Arranged:    DME Agency:     HH Arranged:    HH Agency:     Status of Service:  In process, will continue to follow  If discussed at Long Length of Stay Meetings, dates discussed:    Additional Comments:  Apolonio Schneiders, RN 04/04/2017, 6:18 PM

## 2017-04-04 NOTE — ED Notes (Signed)
ED Provider at bedside. 

## 2017-04-04 NOTE — Progress Notes (Signed)
Patient's potassium per last lab value 3.1, no potassium given per [facility] since lab resulted.  RN text paged [facility] with this information.

## 2017-04-04 NOTE — ED Notes (Signed)
Patient transported to X-ray 

## 2017-04-04 NOTE — Care Management Obs Status (Signed)
[place] NOTIFICATION   Patient Details  Name: MASUD HOLUB MRN: [id number] Date of Birth: [birth year]   Medicare Observation Status Notification Given:  Yes    Apolonio Schneiders, RN 04/04/2017, 6:04 PM

## 2017-04-04 NOTE — ED Provider Notes (Signed)
[facility] EMERGENCY DEPARTMENT Provider Note   CSN: [id number] Arrival date & time: 04/04/17  1144     History   Chief Complaint Chief Complaint  Patient presents with  . Chest Pain    HPI LOC FEINSTEIN is a 56 y.o. male.   Chest Pain   This is a new problem. The current episode started more than 2 days ago. The problem occurs constantly. The problem has not changed since onset.The pain is associated with exertion. The pain radiates to the left neck, left jaw and left shoulder. The symptoms are aggravated by exertion. Associated symptoms include nausea and shortness of breath. He has tried nothing for the symptoms. The treatment provided no relief. There are no known risk factors.    Past Medical History:  Diagnosis Date  . Chest pain    a. 11/2002 Cath: EF 68%, LM nl, LAD small, nl, LCX nl, RCA tortuous/nl;  b. 08/2011 St Echo: EF 60%, normal contractility, no scar/ischemia.  Marland Kitchen COPD (chronic obstructive pulmonary disease) ([place])   . Enlarged prostate   . Hypertension   . Sickle cell trait ([place])   . Sleep apnea   . SVT (supraventricular tachycardia) [id number])     Patient Active Problem List   Diagnosis Date Noted  . Abdominal pain 12/05/2013  . Diarrhea 12/05/2013  . Hypokalemia 12/05/2013  . Chest pain- atypical. Normal LHC 03/2013 03/10/2013  . HTN (hypertension) 03/10/2013  . Nonspecific abnormal electrocardiogram (ECG) (EKG) 03/10/2013    Past Surgical History:  Procedure Laterality Date  . LEFT HEART CATHETERIZATION WITH CORONARY ANGIOGRAM N/A 03/11/2013   Procedure: LEFT HEART CATHETERIZATION WITH CORONARY ANGIOGRAM;  Surgeon: Jolaine Artist, MD;  Location: [facility] CATH LAB;  Service: Cardiovascular;  Laterality: N/A;  . TONSILLECTOMY    . TRANSURETHRAL RESECTION OF PROSTATE         Home Medications    Prior to Admission medications   Medication Sig Start Date End Date Taking? Authorizing Provider  amLODipine (NORVASC) 10 MG tablet  Take 1 tablet (10 mg total) by mouth daily. 02/07/15  Yes Charlesetta Shanks, MD  apixaban (ELIQUIS) 5 MG TABS tablet Take 5 mg by mouth 2 (two) times daily.   Yes [provider]  benzonatate (TESSALON) 100 MG capsule Take 1 capsule (100 mg total) by mouth every 8 (eight) hours. 03/30/17  Yes Isla Pence, MD  cetirizine (ZYRTEC) 10 MG tablet Take 10 mg by mouth daily.   Yes [provider]  gabapentin (NEURONTIN) 300 MG capsule Take 300 mg by mouth at bedtime.   Yes [provider]  hydrochlorothiazide (HYDRODIURIL) 25 MG tablet Take 25 mg by mouth daily.    Yes [provider]  ibuprofen (ADVIL,MOTRIN) 600 MG tablet Take 600 mg by mouth every 8 (eight) hours as needed for moderate pain.   Yes [provider]  metoprolol tartrate (LOPRESSOR) 50 MG tablet Take 25 mg by mouth 2 (two) times daily.   Yes [provider]  naproxen (NAPROSYN) 375 MG tablet Take 1 tablet (375 mg total) by mouth 2 (two) times daily with a meal. Patient taking differently: Take 375 mg by mouth 2 (two) times daily as needed for mild pain.  12/08/13  Yes Barrett, Evelene Croon, PA-C  nitroGLYCERIN (NITROSTAT) 0.4 MG SL tablet Place 1 tablet (0.4 mg total) under the tongue every 5 (five) minutes as needed for chest pain. 03/12/13  Yes Thurnell Lose, MD  omeprazole (PRILOSEC) 40 MG capsule Take 40 mg by  mouth daily.   Yes [provider]  oxyCODONE (OXY IR/ROXICODONE) 5 MG immediate release tablet Take 1 tablet (5 mg total) by mouth every 6 (six) hours as needed for moderate pain or severe pain. 03/12/13  Yes Thurnell Lose, MD  oxycodone-acetaminophen (LYNOX) 7.5-300 MG tablet Take 1 tablet by mouth every 8 (eight) hours as needed for pain.   Yes [provider]  pantoprazole (PROTONIX) 40 MG tablet Take 40 mg by mouth daily.   Yes [provider]  predniSONE (STERAPRED UNI-PAK 21 TAB) 10 MG (21) TBPK tablet Take 6 tabs by mouth daily  for 2 days,  then 5 tabs for 2 days, then 4 tabs for 2 days, then 3 tabs for 2 days, 2 tabs for 2 days, then 1 tab by mouth daily for 2 days 03/30/17  Yes Isla Pence, MD  sotalol (BETAPACE) 80 MG tablet Take 40 mg by mouth 2 (two) times daily.   Yes [provider]  SUMAtriptan (IMITREX) 50 MG tablet Take 50-100 mg by mouth once as needed for migraine. 05/03/16  Yes [provider]  tamsulosin (FLOMAX) 0.4 MG CAPS capsule Take 0.4 mg by mouth daily.   Yes [provider]  tiotropium (SPIRIVA) 18 MCG inhalation capsule Place 18 mcg into inhaler and inhale daily.   Yes [provider]  traMADol (ULTRAM) 50 MG tablet Take 50 mg by mouth every 6 (six) hours as needed for moderate pain.   Yes [provider]  triamterene-hydrochlorothiazide (DYAZIDE) 37.5-25 MG per capsule Take 1 each (1 capsule total) by mouth daily. 12/11/13  Yes Barrett, Evelene Croon, PA-C  VESICARE 5 MG tablet Take 5 mg by mouth daily. 02/28/17  Yes [provider]  amLODipine (NORVASC) 5 MG tablet TAKE 1 TABLET BY MOUTH EVERY DAY Patient not taking: Reported on 04/04/2017 12/13/14   Barrett, Evelene Croon, PA-C  azithromycin (ZITHROMAX) 250 MG tablet Take 1 every day until finished. Patient not taking: Reported on 04/04/2017 03/31/17   Isla Pence, MD  HYDROcodone-acetaminophen (NORCO/VICODIN) 5-325 MG tablet Take 1-2 tablets by mouth every 4 (four) hours as needed for moderate pain or severe pain. Patient not taking: Reported on 04/04/2017 02/07/15   Charlesetta Shanks, MD  isosorbide mononitrate (IMDUR) 30 MG 24 hr tablet TAKE 1 TABLET BY MOUTH EVERY DAY Patient not taking: Reported on 04/04/2017 04/13/14   Satira Sark, MD  isosorbide mononitrate (IMDUR) 60 MG 24 hr tablet Take 1 tablet (60 mg total) by mouth daily. Patient not taking: Reported on 04/04/2017 02/07/15   Charlesetta Shanks, MD    Family History Family History  Problem Relation Age of Onset  . CAD Mother        died in her 56's  - MI  . Multiple myeloma Brother   . Sickle cell anemia Father        died @ 53    Social History Social History   Tobacco Use  . Smoking status: Never Smoker  . Smokeless tobacco: Never Used  Substance Use Topics  . Alcohol use: No  . Drug use: No     Allergies   Patient has no known allergies.   Review of Systems Review of Systems  Respiratory: Positive for shortness of breath.   Cardiovascular: Positive for chest pain.  Gastrointestinal: Positive for nausea.  All other systems reviewed and are negative.    Physical Exam Updated Vital Signs BP (!) 125/98   Pulse (!) 58   Temp 98.6 F (37 C) (  Oral)   Resp 10   SpO2 99%   Physical Exam  Constitutional: He appears well-developed and well-nourished.  HENT:  Head: Normocephalic and atraumatic.  Eyes: Conjunctivae and EOM are normal.  Neck: Normal range of motion.  Cardiovascular: Normal rate.  Pulmonary/Chest: Effort normal. No respiratory distress. He has decreased breath sounds. He has wheezes.  Abdominal: Soft. He exhibits no distension.  Musculoskeletal: Normal range of motion.       Right lower leg: Normal.       Left lower leg: Normal.  Neurological: He is alert.  Skin: Skin is warm and dry.  Nursing note and vitals reviewed.    ED Treatments / Results  Labs (all labs ordered are listed, but only abnormal results are displayed) Labs Reviewed  BASIC METABOLIC PANEL - Abnormal; Notable for the following components:      Result Value   Potassium 3.1 (*)    Creatinine, Ser 1.27 (*)    Calcium 8.8 (*)    All other components within normal limits  CBC - Abnormal; Notable for the following components:   WBC 17.0 (*)    Hemoglobin 12.6 (*)    HCT 38.1 (*)    All other components within normal limits  I-STAT TROPONIN, ED    EKG  EKG Interpretation None       Radiology Dg Chest 2 View  Result Date: 04/04/2017 CLINICAL DATA:  Cough.  Bronchitis for 2 weeks EXAM: CHEST  2 VIEW COMPARISON:   03/30/2017 FINDINGS: The heart size and mediastinal contours are within normal limits. Both lungs are clear. Spondylosis noted within the thoracic spine. IMPRESSION: No active cardiopulmonary disease. Electronically Signed   By: Taylor  Stroud M.D.   On: 04/04/2017 13:00    Procedures Procedures (including critical care time)  Medications Ordered in ED Medications  nitroGLYCERIN (NITROSTAT) SL tablet 0.4 mg (0.4 mg Sublingual Given 04/04/17 1421)  ipratropium-albuterol (DUONEB) 0.5-2.5 (3) MG/3ML nebulizer solution 3 mL (3 mLs Nebulization Given 04/04/17 1416)  fentaNYL (SUBLIMAZE) injection 50 mcg (50 mcg Intravenous Given 04/04/17 1423)     Initial Impression / Assessment and Plan / ED Course  I have reviewed the triage vital signs and the nursing notes.  Pertinent labs & imaging results that were available during my care of the patient were reviewed by me and considered in my medical decision making (see chart for details).     Patient with bronchitis that seemed like he is likely getting over but over the last few days he is also had exertional chest pain associated with shortness of breath, nausea and diaphoresis.  His concern for possible iliac etiology and unstable angina so will admit.  His last stress test was in 2013 and probably needs to be repeated.  First troponin negative.  His EKG similar to previous months without any evidence of acute ischemia.  Stable for admission.  Discussed with internal medicine teaching service who come to see and admit.   Final Clinical Impressions(s) / ED Diagnoses   Final diagnoses:  Nonspecific chest pain      , , MD 04/04/17 1648  

## 2017-04-04 NOTE — ED Triage Notes (Signed)
Pt arrives from PCP c/o CP, describes as "squeezing, severe", rates now at 2/10. Pt reports taking 3 NTG at home, taking 324 ASA at PCP, reports pain was 10/10, radiated to jaw and back, reports pain only present in back at this time.

## 2017-04-04 NOTE — Progress Notes (Signed)
Internal Medicine Residency paged pertaining to patient's last potassium result.

## 2017-04-05 DIAGNOSIS — I10 Essential (primary) hypertension: Secondary | ICD-10-CM | POA: Diagnosis not present

## 2017-04-05 DIAGNOSIS — Z79899 Other long term (current) drug therapy: Secondary | ICD-10-CM | POA: Diagnosis not present

## 2017-04-05 DIAGNOSIS — R0789 Other chest pain: Secondary | ICD-10-CM | POA: Diagnosis not present

## 2017-04-05 DIAGNOSIS — J449 Chronic obstructive pulmonary disease, unspecified: Secondary | ICD-10-CM | POA: Diagnosis not present

## 2017-04-05 LAB — CBC
HCT: 40.1 % (ref 39.0–52.0)
Hemoglobin: 13.1 g/dL (ref 13.0–17.0)
MCH: 28.1 pg (ref 26.0–34.0)
MCHC: 32.7 g/dL (ref 30.0–36.0)
MCV: 86.1 fL (ref 78.0–100.0)
Platelets: 299 10*3/uL (ref 150–400)
RBC: 4.66 MIL/uL (ref 4.22–5.81)
RDW: 13.3 % (ref 11.5–15.5)
WBC: 11.3 10*3/uL — ABNORMAL HIGH (ref 4.0–10.5)

## 2017-04-05 LAB — BASIC METABOLIC PANEL
Anion gap: 8 (ref 5–15)
BUN: 13 mg/dL (ref 6–20)
CO2: 28 mmol/L (ref 22–32)
Calcium: 8.6 mg/dL — ABNORMAL LOW (ref 8.9–10.3)
Chloride: 104 mmol/L (ref 101–111)
Creatinine, Ser: 1.34 mg/dL — ABNORMAL HIGH (ref 0.61–1.24)
GFR calc Af Amer: >60 mL/min (ref >60)
GFR calc non Af Amer: 57 mL/min — ABNORMAL LOW (ref >60)
Glucose, Bld: 84 mg/dL (ref 65–99)
Potassium: 3.8 mmol/L (ref 3.5–5.1)
Sodium: 140 mmol/L (ref 135–145)

## 2017-04-05 LAB — HIV ANTIBODY (ROUTINE TESTING W REFLEX): HIV Screen 4th Generation wRfx: NONREACTIVE

## 2017-04-05 LAB — TROPONIN I
Troponin I: <0.03 ng/mL (ref <0.03)
Troponin I: <0.03 ng/mL (ref <0.03)

## 2017-04-05 LAB — MAGNESIUM: Magnesium: 2.4 mg/dL (ref 1.7–2.4)

## 2017-04-05 MED ORDER — GUAIFENESIN-DM 100-10 MG/5ML PO SYRP
5.0000 mL | ORAL_SOLUTION | ORAL | Status: DC | PRN
  Administered 2017-04-05: 5 mL via ORAL
  Filled 2017-04-05: qty 5

## 2017-04-05 NOTE — Discharge Summary (Signed)
Name: [name] MRN: [id number] DOB: 1960/08/29 56 y.o. PCP: [name], MD  Date of Admission: 04/04/2017 11:44 AM Date of Discharge: 04/05/2017 Attending Physician: Murriel Hopper Discharge Diagnosis: 1.  Active Problems:   Atypical chest pain   Discharge Medications: Allergies as of 04/05/2017   No Known Allergies     Medication List    STOP taking these medications   ibuprofen 600 MG tablet Commonly known as:  ADVIL,MOTRIN   naproxen 375 MG tablet Commonly known as:  NAPROSYN   pantoprazole 40 MG tablet Commonly known as:  PROTONIX     TAKE these medications   amLODipine 10 MG tablet Commonly known as:  NORVASC Take 1 tablet (10 mg total) by mouth daily.   benzonatate 100 MG capsule Commonly known as:  TESSALON Take 1 capsule (100 mg total) by mouth every 8 (eight) hours.   cetirizine 10 MG tablet Commonly known as:  ZYRTEC Take 10 mg by mouth daily.   ELIQUIS 5 MG Tabs tablet Generic drug:  apixaban Take 5 mg by mouth 2 (two) times daily.   gabapentin 300 MG capsule Commonly known as:  NEURONTIN Take 300 mg by mouth at bedtime.   metoprolol tartrate 50 MG tablet Commonly known as:  LOPRESSOR Take 25 mg by mouth 2 (two) times daily.   nitroGLYCERIN 0.4 MG SL tablet Commonly known as:  NITROSTAT Place 1 tablet (0.4 mg total) under the tongue every 5 (five) minutes as needed for chest pain.   omeprazole 40 MG capsule Commonly known as:  PRILOSEC Take 40 mg by mouth daily.   oxyCODONE 5 MG immediate release tablet Commonly known as:  Oxy IR/ROXICODONE Take 1 tablet (5 mg total) by mouth every 6 (six) hours as needed for moderate pain or severe pain.   oxycodone-acetaminophen 7.5-300 MG tablet Commonly known as:  LYNOX Take 1 tablet by mouth every 8 (eight) hours as needed for pain.   predniSONE 10 MG (21) Tbpk tablet Commonly known as:  STERAPRED UNI-PAK 21 TAB Take 6 tabs by mouth daily  for 2 days, then 5 tabs for 2  days, then 4 tabs for 2 days, then 3 tabs for 2 days, 2 tabs for 2 days, then 1 tab by mouth daily for 2 days   sotalol 80 MG tablet Commonly known as:  BETAPACE Take 40 mg by mouth 2 (two) times daily.   SUMAtriptan 50 MG tablet Commonly known as:  IMITREX Take 50-100 mg by mouth once as needed for migraine.   tamsulosin 0.4 MG Caps capsule Commonly known as:  FLOMAX Take 0.4 mg by mouth daily.   tiotropium 18 MCG inhalation capsule Commonly known as:  SPIRIVA Place 18 mcg into inhaler and inhale daily.   traMADol 50 MG tablet Commonly known as:  ULTRAM Take 50 mg by mouth every 6 (six) hours as needed for moderate pain.   triamterene-hydrochlorothiazide 37.5-25 MG capsule Commonly known as:  DYAZIDE Take 1 each (1 capsule total) by mouth daily.   VESICARE 5 MG tablet Generic drug:  solifenacin Take 5 mg by mouth daily.       Disposition and follow-up:   Mr.[name] was discharged from [facility] in Good condition.  At the hospital follow up visit please address:  1.   Atypical chest pain -following up with Dr. Philbert Riser to continue chest pain workup  2.  Labs / imaging needed at time of follow-up: BMP  3.  Pending labs/ test needing  follow-up: none  Follow-up Appointments: Follow-up Information    Alphia Moh, MD Follow up in 2 week(s).   Specialty:  Internal Medicine Why:  please follow up with your cardiologist Dr. Philbert Riser in the next 2 weeks Contact information: [address] [id number] [phone]           Hospital Course by problem list: Active Problems:   Atypical chest pain Patient arrived to the emergency department chest pain-free and completely stable.  He was sent by his PCP for concerns of ongoing chest pain that began on Sunday of this week.  Patient states he went to urgent care for a new cough, which was bothering his COPD, he was diagnosed with bronchitis.  Given a short course of  antibiotics, steroid, cough syrup.  Later that week he felt the bronchitis was resolving however he began to have chest pain at rest that lasted a few hours. This happened about 3 days that week including Friday morning before his appointment with his PCP. Despite extensive cardiac workup in the past, which has all been negative, he has continued to have chronic chest pain.  Here we trended his troponins he had four negative results additionally his EKG showed no changes to suggest ACS.  He was in sinus rhythm throughout the duration of his stay on cardiac monitoring other than a short run of sinus tach versus atrial tach during the night one evening.   He continued to not have chest pain during his stay and was agreeable to follow-up with Dr. Philbert Riser his cardiologist to further evaluate this issue.    Discharge Vitals:   BP 116/82 (BP Location: Right Arm)   Pulse 64   Temp 98.7 F (37.1 C) (Oral)   Resp 12   Ht 5\' 11"  (1.803 m)   Wt 208 lb 8 oz (94.6 kg)   SpO2 97%   BMI 29.08 kg/m   Pertinent Labs, Studies, and Procedures:   Troponin 0.00 x4  TSH 0.514  Lab Results  Component Value Date   HGBA1C 6.1 (H) 04/04/2017    HIV Screen 4th Generation Non Reactive Comment: (NOTE)  Performed At: [facility]  [place], [place] [id number]   CHEST  2 VIEW   COMPARISON:  03/30/2017   FINDINGS: The heart size and mediastinal contours are within normal limits. Both lungs are clear. Spondylosis noted within the thoracic spine.   IMPRESSION: No active cardiopulmonary disease.     Discharge Instructions: Discharge Instructions    Call MD for:   Complete by:  As directed    Call MD for:  difficulty breathing, headache or visual disturbances   Complete by:  As directed    Call MD for:  extreme fatigue   Complete by:  As directed    Call MD for:  hives   Complete by:  As directed    Call MD for:  persistant dizziness or light-headedness   Complete by:  As  directed    Call MD for:  persistant nausea and vomiting   Complete by:  As directed    Call MD for:  redness, tenderness, or signs of infection (pain, swelling, redness, odor or green/yellow discharge around incision site)   Complete by:  As directed    Call MD for:  severe uncontrolled pain   Complete by:  As directed    Call MD for:  temperature >100.4   Complete by:  As directed    Diet - low sodium heart  healthy   Complete by:  As directed    Discharge instructions   Complete by:  As directed    Mr. Turvey, we ruled out the possibility of any blockages in the arteries feeding into your heart that may be causing you chest pain.  We are still unsure what exactly has caused this.  You do have GERD and have been taking additional NSAIDS for musculoskeletal pain.  Additionally, you did have bronchitis that may have caused some of your chest pain as well.  Please follow up with your cardiologist to discuss this continued chest pain.   Increase activity slowly   Complete by:  As directed       Signed: Katherine Roan, MD 04/06/2017, 1:19 PM

## 2017-04-05 NOTE — Care Management (Signed)
Spoke further with pt about observation status and answered questions to patient's satisfaction.  Pt states he works part-time and has Press [employer].  Pt concerned that on limited budget, hospital bill will be burdensome.  Pt wondering if he could be discharged.  Advised pt will be discharged when medically clear or changed to inpatient if condition becomes more serious.  CM will continue to follow.

## 2017-04-05 NOTE — Progress Notes (Signed)
   Subjective: Patient continues to have no chest pain, no shortness of breath, no nausea vomiting or diarrhea.  We explained that his cardiac enzymes have been negative and his EKG was not suggestive of new ACS.  Patient is happy to leave today and follow-up with his cardiologist Dr. Philbert Riser.    Objective:  Vital signs in last 24 hours: Vitals:   04/04/17 1835 04/04/17 2101 04/05/17 0504 04/05/17 0811  BP: (!) 157/106 118/78 (!) 123/94   Pulse: 61 75 64   Resp: 12 18 12    Temp: 97.7 F (36.5 C) 98.4 F (36.9 C) 98.4 F (36.9 C)   TempSrc: Oral Oral Oral   SpO2: 97% 96% 97% 100%  Weight: 208 lb 4.8 oz (94.5 kg)  208 lb 8 oz (94.6 kg)   Height: 5\' 11"  (1.803 m)      Physical Exam  Constitutional: He is oriented to person, place, and time. He appears well-developed and well-nourished.  HENT:  Head: Normocephalic and atraumatic.  Eyes: Right eye exhibits no discharge. Left eye exhibits no discharge. No scleral icterus.  Neck: No JVD present.  Cardiovascular: Normal rate, regular rhythm, normal heart sounds and intact distal pulses. Exam reveals no gallop and no friction rub.  No murmur heard. Pulmonary/Chest: Effort normal and breath sounds normal. No accessory muscle usage. No respiratory distress. He has no wheezes. He has no rales.  Abdominal: Soft. Bowel sounds are normal. He exhibits no distension and no mass. There is no tenderness. There is no guarding.  Musculoskeletal:       Right lower leg: He exhibits no edema.       Left lower leg: He exhibits no edema.  Neurological: He is alert and oriented to person, place, and time.  Psychiatric: His mood appears not anxious. He is not agitated.   Assessment/Plan:  Active Problems:   Atypical chest pain  Atypical chest pain: pt with extensive workup for CAD which has all been negative.  There was concern for HOCM/SAM per chart review from pts cardiologist at [facility], however cardiac MRI showed no evidence of this.    -Troponin  neg x4, EKG neg for ACS -Tele showed short run of sinus vs atrial tach last night -has not had any pain since arrival -pain could be from recent respiratory infection, although not pleuritic   Acute on chronic COPD: pt reports recent diagnosis of bronchitis has finished short course of abx and steroids, still has cough but says feels much better since Sunday.  Chest x-ray here in ED clear, does have increased white count but afebrile, had short course of steroids from urgent care, no hypoxia.  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  He takes spiriva daily at home.     -continue home spiriva -supportive care for now, already treated by urgent care with abx, steroid burst   Afib: chronic, has remained in SR for the most part since ablation in July.  On metoprolol tartrate 25mg  BID, sotalol 80mg ,  eliquis   -continue home meds   Dispo: Medically stable with discharge home today.   Katherine Roan, MD 04/05/2017, 1:22 PM Vickki Muff MD PGY-1 Internal Medicine Pager # [phone]

## 2017-12-03 ENCOUNTER — Emergency Department (HOSPITAL_COMMUNITY)
Admission: EM | Admit: 2017-12-03 | Discharge: 2017-12-03 | Disposition: A | Discharge status: Home / Self Care | Payer: Medicare Other | Attending: Emergency Medicine | Admitting: Emergency Medicine

## 2017-12-03 ENCOUNTER — Encounter (HOSPITAL_COMMUNITY): Payer: Self-pay | Admitting: Emergency Medicine

## 2017-12-03 ENCOUNTER — Emergency Department (HOSPITAL_COMMUNITY): Payer: Medicare Other

## 2017-12-03 DIAGNOSIS — R110 Nausea: Secondary | ICD-10-CM | POA: Insufficient documentation

## 2017-12-03 DIAGNOSIS — Z5321 Procedure and treatment not carried out due to patient leaving prior to being seen by health care provider: Secondary | ICD-10-CM | POA: Diagnosis not present

## 2017-12-03 DIAGNOSIS — R0789 Other chest pain: Secondary | ICD-10-CM | POA: Diagnosis not present

## 2017-12-03 LAB — CBC
HCT: 45.9 % (ref 39.0–52.0)
Hemoglobin: 15.6 g/dL (ref 13.0–17.0)
MCH: 27.9 pg (ref 26.0–34.0)
MCHC: 34 g/dL (ref 30.0–36.0)
MCV: 82 fL (ref 78.0–100.0)
Platelets: 286 10*3/uL (ref 150–400)
RBC: 5.6 MIL/uL (ref 4.22–5.81)
RDW: 13.2 % (ref 11.5–15.5)
WBC: 7 10*3/uL (ref 4.0–10.5)

## 2017-12-03 LAB — BASIC METABOLIC PANEL
Anion gap: 11 (ref 5–15)
BUN: 13 mg/dL (ref 6–20)
CO2: 25 mmol/L (ref 22–32)
Calcium: 9.6 mg/dL (ref 8.9–10.3)
Chloride: 103 mmol/L (ref 98–111)
Creatinine, Ser: 1.47 mg/dL — ABNORMAL HIGH (ref 0.61–1.24)
GFR calc Af Amer: 59 mL/min — ABNORMAL LOW (ref >60)
GFR calc non Af Amer: 51 mL/min — ABNORMAL LOW (ref >60)
Glucose, Bld: 110 mg/dL — ABNORMAL HIGH (ref 70–99)
Potassium: 3.4 mmol/L — ABNORMAL LOW (ref 3.5–5.1)
Sodium: 139 mmol/L (ref 135–145)

## 2017-12-03 LAB — POCT I-STAT TROPONIN I: Troponin i, poc: 0.02 ng/mL (ref 0.00–0.08)

## 2017-12-03 NOTE — ED Notes (Signed)
Pt was called for a room, pt not in lobby or outside at this time

## 2017-12-03 NOTE — ED Notes (Signed)
Pt called for room, with no responce

## 2017-12-03 NOTE — ED Triage Notes (Signed)
Pt c/o left chest pain that radiates to his back and abd that started around 11am today. Reports nausea but denies vomiting. Last BM today that was normal.

## 2017-12-03 NOTE — ED Notes (Signed)
Pt not answering or seen in lobby

## 2017-12-23 DIAGNOSIS — H903 Sensorineural hearing loss, bilateral: Secondary | ICD-10-CM | POA: Insufficient documentation

## 2017-12-23 DIAGNOSIS — H9202 Otalgia, left ear: Secondary | ICD-10-CM | POA: Insufficient documentation

## 2018-03-30 DIAGNOSIS — R42 Dizziness and giddiness: Secondary | ICD-10-CM | POA: Insufficient documentation

## 2018-05-12 ENCOUNTER — Other Ambulatory Visit: Payer: Self-pay | Admitting: Family Medicine

## 2018-05-12 ENCOUNTER — Ambulatory Visit
Admission: RE | Admit: 2018-05-12 | Discharge: 2018-05-12 | Disposition: A | Discharge status: Home / Self Care | Payer: Medicare Other | Source: Ambulatory Visit | Attending: Family Medicine | Admitting: Family Medicine

## 2018-05-12 DIAGNOSIS — R52 Pain, unspecified: Secondary | ICD-10-CM

## 2018-08-07 DIAGNOSIS — K922 Gastrointestinal hemorrhage, unspecified: Secondary | ICD-10-CM

## 2018-08-07 HISTORY — DX: Gastrointestinal hemorrhage, unspecified: K92.2

## 2018-08-25 ENCOUNTER — Other Ambulatory Visit: Payer: Self-pay

## 2018-08-25 ENCOUNTER — Encounter (HOSPITAL_BASED_OUTPATIENT_CLINIC_OR_DEPARTMENT_OTHER): Payer: Self-pay | Admitting: *Deleted

## 2018-08-25 ENCOUNTER — Inpatient Hospital Stay (HOSPITAL_BASED_OUTPATIENT_CLINIC_OR_DEPARTMENT_OTHER)
Admission: EM | Admit: 2018-08-25 | Discharge: 2018-08-29 | DRG: 378 | Disposition: A | Discharge status: Home / Self Care | Payer: Medicare Other | Attending: Internal Medicine | Admitting: Internal Medicine

## 2018-08-25 DIAGNOSIS — K922 Gastrointestinal hemorrhage, unspecified: Secondary | ICD-10-CM

## 2018-08-25 DIAGNOSIS — I890 Lymphedema, not elsewhere classified: Secondary | ICD-10-CM | POA: Diagnosis present

## 2018-08-25 DIAGNOSIS — K2981 Duodenitis with bleeding: Secondary | ICD-10-CM | POA: Diagnosis not present

## 2018-08-25 DIAGNOSIS — I5032 Chronic diastolic (congestive) heart failure: Secondary | ICD-10-CM | POA: Diagnosis present

## 2018-08-25 DIAGNOSIS — K222 Esophageal obstruction: Secondary | ICD-10-CM | POA: Diagnosis present

## 2018-08-25 DIAGNOSIS — K219 Gastro-esophageal reflux disease without esophagitis: Secondary | ICD-10-CM | POA: Diagnosis present

## 2018-08-25 DIAGNOSIS — K224 Dyskinesia of esophagus: Secondary | ICD-10-CM | POA: Diagnosis present

## 2018-08-25 DIAGNOSIS — Z7901 Long term (current) use of anticoagulants: Secondary | ICD-10-CM

## 2018-08-25 DIAGNOSIS — D123 Benign neoplasm of transverse colon: Secondary | ICD-10-CM

## 2018-08-25 DIAGNOSIS — K449 Diaphragmatic hernia without obstruction or gangrene: Secondary | ICD-10-CM | POA: Diagnosis present

## 2018-08-25 DIAGNOSIS — Z8249 Family history of ischemic heart disease and other diseases of the circulatory system: Secondary | ICD-10-CM

## 2018-08-25 DIAGNOSIS — I10 Essential (primary) hypertension: Secondary | ICD-10-CM | POA: Diagnosis present

## 2018-08-25 DIAGNOSIS — G8929 Other chronic pain: Secondary | ICD-10-CM | POA: Diagnosis present

## 2018-08-25 DIAGNOSIS — D573 Sickle-cell trait: Secondary | ICD-10-CM | POA: Diagnosis not present

## 2018-08-25 DIAGNOSIS — E663 Overweight: Secondary | ICD-10-CM | POA: Diagnosis present

## 2018-08-25 DIAGNOSIS — G4733 Obstructive sleep apnea (adult) (pediatric): Secondary | ICD-10-CM | POA: Diagnosis present

## 2018-08-25 DIAGNOSIS — I480 Paroxysmal atrial fibrillation: Secondary | ICD-10-CM | POA: Diagnosis present

## 2018-08-25 DIAGNOSIS — K5909 Other constipation: Secondary | ICD-10-CM | POA: Diagnosis not present

## 2018-08-25 DIAGNOSIS — Z79899 Other long term (current) drug therapy: Secondary | ICD-10-CM

## 2018-08-25 DIAGNOSIS — Q438 Other specified congenital malformations of intestine: Secondary | ICD-10-CM

## 2018-08-25 DIAGNOSIS — E876 Hypokalemia: Secondary | ICD-10-CM | POA: Diagnosis not present

## 2018-08-25 DIAGNOSIS — K5731 Diverticulosis of large intestine without perforation or abscess with bleeding: Secondary | ICD-10-CM | POA: Diagnosis present

## 2018-08-25 DIAGNOSIS — Z9079 Acquired absence of other genital organ(s): Secondary | ICD-10-CM

## 2018-08-25 DIAGNOSIS — R0602 Shortness of breath: Secondary | ICD-10-CM

## 2018-08-25 DIAGNOSIS — I130 Hypertensive heart and chronic kidney disease with heart failure and stage 1 through stage 4 chronic kidney disease, or unspecified chronic kidney disease: Secondary | ICD-10-CM | POA: Diagnosis present

## 2018-08-25 DIAGNOSIS — Z807 Family history of other malignant neoplasms of lymphoid, hematopoietic and related tissues: Secondary | ICD-10-CM

## 2018-08-25 DIAGNOSIS — N400 Enlarged prostate without lower urinary tract symptoms: Secondary | ICD-10-CM | POA: Diagnosis present

## 2018-08-25 DIAGNOSIS — D62 Acute posthemorrhagic anemia: Secondary | ICD-10-CM | POA: Diagnosis present

## 2018-08-25 DIAGNOSIS — K635 Polyp of colon: Secondary | ICD-10-CM | POA: Diagnosis not present

## 2018-08-25 DIAGNOSIS — Z6829 Body mass index (BMI) 29.0-29.9, adult: Secondary | ICD-10-CM

## 2018-08-25 DIAGNOSIS — J449 Chronic obstructive pulmonary disease, unspecified: Secondary | ICD-10-CM | POA: Diagnosis present

## 2018-08-25 DIAGNOSIS — N183 Chronic kidney disease, stage 3 unspecified: Secondary | ICD-10-CM | POA: Diagnosis present

## 2018-08-25 DIAGNOSIS — K648 Other hemorrhoids: Secondary | ICD-10-CM | POA: Diagnosis not present

## 2018-08-25 DIAGNOSIS — Z7902 Long term (current) use of antithrombotics/antiplatelets: Secondary | ICD-10-CM

## 2018-08-25 DIAGNOSIS — Z1159 Encounter for screening for other viral diseases: Secondary | ICD-10-CM | POA: Diagnosis not present

## 2018-08-25 DIAGNOSIS — K5900 Constipation, unspecified: Secondary | ICD-10-CM

## 2018-08-25 DIAGNOSIS — Z972 Presence of dental prosthetic device (complete) (partial): Secondary | ICD-10-CM

## 2018-08-25 DIAGNOSIS — Z832 Family history of diseases of the blood and blood-forming organs and certain disorders involving the immune mechanism: Secondary | ICD-10-CM

## 2018-08-25 DIAGNOSIS — N1831 Chronic kidney disease, stage 3a: Secondary | ICD-10-CM | POA: Diagnosis present

## 2018-08-25 HISTORY — DX: Gastro-esophageal reflux disease without esophagitis: K21.9

## 2018-08-25 HISTORY — DX: Gastrointestinal hemorrhage, unspecified: K92.2

## 2018-08-25 LAB — CBC
HCT: 31.1 % — ABNORMAL LOW (ref 39.0–52.0)
HCT: 27.6 % — ABNORMAL LOW (ref 39.0–52.0)
Hemoglobin: 9.9 g/dL — ABNORMAL LOW (ref 13.0–17.0)
Hemoglobin: 8.8 g/dL — ABNORMAL LOW (ref 13.0–17.0)
MCH: 22.9 pg — ABNORMAL LOW (ref 26.0–34.0)
MCH: 22.7 pg — ABNORMAL LOW (ref 26.0–34.0)
MCHC: 31.8 g/dL (ref 30.0–36.0)
MCHC: 31.9 g/dL (ref 30.0–36.0)
MCV: 71.8 fL — ABNORMAL LOW (ref 80.0–100.0)
MCV: 71.3 fL — ABNORMAL LOW (ref 80.0–100.0)
Platelets: 248 10*3/uL (ref 150–400)
Platelets: 267 10*3/uL (ref 150–400)
RBC: 4.33 MIL/uL (ref 4.22–5.81)
RBC: 3.87 MIL/uL — ABNORMAL LOW (ref 4.22–5.81)
RDW: 15.4 % (ref 11.5–15.5)
RDW: 15.4 % (ref 11.5–15.5)
WBC: 8.3 10*3/uL (ref 4.0–10.5)
WBC: 8 10*3/uL (ref 4.0–10.5)
nRBC: 0 % (ref 0.0–0.2)
nRBC: 0 % (ref 0.0–0.2)

## 2018-08-25 LAB — CBC WITH DIFFERENTIAL/PLATELET
Abs Immature Granulocytes: 0.02 10*3/uL (ref 0.00–0.07)
Basophils Absolute: 0 10*3/uL (ref 0.0–0.1)
Basophils Relative: 0 %
Eosinophils Absolute: 0.4 10*3/uL (ref 0.0–0.5)
Eosinophils Relative: 5 %
HCT: 32.5 % — ABNORMAL LOW (ref 39.0–52.0)
Hemoglobin: 9.9 g/dL — ABNORMAL LOW (ref 13.0–17.0)
Immature Granulocytes: 0 %
Lymphocytes Relative: 33 %
Lymphs Abs: 2.3 10*3/uL (ref 0.7–4.0)
MCH: 22.3 pg — ABNORMAL LOW (ref 26.0–34.0)
MCHC: 30.5 g/dL (ref 30.0–36.0)
MCV: 73.4 fL — ABNORMAL LOW (ref 80.0–100.0)
Monocytes Absolute: 0.7 10*3/uL (ref 0.1–1.0)
Monocytes Relative: 10 %
Neutro Abs: 3.6 10*3/uL (ref 1.7–7.7)
Neutrophils Relative %: 52 %
Platelets: 277 10*3/uL (ref 150–400)
RBC: 4.43 MIL/uL (ref 4.22–5.81)
RDW: 15.4 % (ref 11.5–15.5)
WBC: 7 10*3/uL (ref 4.0–10.5)
nRBC: 0 % (ref 0.0–0.2)

## 2018-08-25 LAB — COMPREHENSIVE METABOLIC PANEL
ALT: 22 U/L (ref 0–44)
AST: 30 U/L (ref 15–41)
Albumin: 4.2 g/dL (ref 3.5–5.0)
Alkaline Phosphatase: 72 U/L (ref 38–126)
Anion gap: 10 (ref 5–15)
BUN: 18 mg/dL (ref 6–20)
CO2: 25 mmol/L (ref 22–32)
Calcium: 9.4 mg/dL (ref 8.9–10.3)
Chloride: 105 mmol/L (ref 98–111)
Creatinine, Ser: 1.43 mg/dL — ABNORMAL HIGH (ref 0.61–1.24)
GFR calc Af Amer: >60 mL/min (ref >60)
GFR calc non Af Amer: 53 mL/min — ABNORMAL LOW (ref >60)
Glucose, Bld: 117 mg/dL — ABNORMAL HIGH (ref 70–99)
Potassium: 2.9 mmol/L — ABNORMAL LOW (ref 3.5–5.1)
Sodium: 140 mmol/L (ref 135–145)
Total Bilirubin: 0.6 mg/dL (ref 0.3–1.2)
Total Protein: 8.1 g/dL (ref 6.5–8.1)

## 2018-08-25 LAB — SARS CORONAVIRUS 2 AG (30 MIN TAT): SARS Coronavirus 2 Ag: NEGATIVE

## 2018-08-25 LAB — PROTIME-INR
INR: 1.6 — ABNORMAL HIGH (ref 0.8–1.2)
Prothrombin Time: 18.6 seconds — ABNORMAL HIGH (ref 11.4–15.2)

## 2018-08-25 LAB — OCCULT BLOOD X 1 CARD TO LAB, STOOL: Fecal Occult Bld: POSITIVE — AB

## 2018-08-25 LAB — SARS CORONAVIRUS 2 BY RT PCR (HOSPITAL ORDER, PERFORMED IN ~~LOC~~ HOSPITAL LAB): SARS Coronavirus 2: NEGATIVE

## 2018-08-25 MED ORDER — TRAMADOL HCL 50 MG PO TABS
50.0000 mg | ORAL_TABLET | Freq: Four times a day (QID) | ORAL | Status: DC | PRN
  Administered 2018-08-27: 50 mg via ORAL
  Filled 2018-08-25: qty 1

## 2018-08-25 MED ORDER — FAMOTIDINE 20 MG PO TABS
40.0000 mg | ORAL_TABLET | Freq: Every day | ORAL | Status: DC
  Administered 2018-08-25 – 2018-08-28 (×4): 40 mg via ORAL
  Filled 2018-08-25 (×4): qty 2

## 2018-08-25 MED ORDER — PANTOPRAZOLE SODIUM 40 MG IV SOLR
INTRAVENOUS | Status: AC
  Filled 2018-08-25: qty 40

## 2018-08-25 MED ORDER — SOTALOL HCL 80 MG PO TABS
40.0000 mg | ORAL_TABLET | Freq: Two times a day (BID) | ORAL | Status: DC
  Administered 2018-08-25 – 2018-08-29 (×7): 40 mg via ORAL
  Filled 2018-08-25 (×7): qty 1

## 2018-08-25 MED ORDER — SODIUM CHLORIDE 0.9 % IV SOLN
8.0000 mg/h | INTRAVENOUS | Status: DC
  Administered 2018-08-25: 8 mg/h via INTRAVENOUS
  Filled 2018-08-25: qty 80

## 2018-08-25 MED ORDER — LORATADINE 10 MG PO TABS
10.0000 mg | ORAL_TABLET | Freq: Every day | ORAL | Status: DC
  Administered 2018-08-26 – 2018-08-29 (×4): 10 mg via ORAL
  Filled 2018-08-25 (×4): qty 1

## 2018-08-25 MED ORDER — AMLODIPINE BESYLATE 10 MG PO TABS: 10.0000 mg | ORAL_TABLET | Freq: Every day | ORAL | Status: DC

## 2018-08-25 MED ORDER — OXYCODONE HCL 5 MG PO TABS
5.0000 mg | ORAL_TABLET | Freq: Four times a day (QID) | ORAL | Status: DC | PRN
  Administered 2018-08-25: 5 mg via ORAL
  Filled 2018-08-25: qty 1

## 2018-08-25 MED ORDER — ONDANSETRON HCL 4 MG/2ML IJ SOLN
4.0000 mg | Freq: Four times a day (QID) | INTRAMUSCULAR | Status: DC | PRN
  Administered 2018-08-27: 4 mg via INTRAVENOUS
  Filled 2018-08-25: qty 2

## 2018-08-25 MED ORDER — TIOTROPIUM BROMIDE MONOHYDRATE 18 MCG IN CAPS: 18.0000 ug | ORAL_CAPSULE | Freq: Every day | RESPIRATORY_TRACT | Status: DC

## 2018-08-25 MED ORDER — ONDANSETRON HCL 4 MG PO TABS: 4.0000 mg | ORAL_TABLET | Freq: Four times a day (QID) | ORAL | Status: DC | PRN

## 2018-08-25 MED ORDER — ACETAMINOPHEN 650 MG RE SUPP: 650.0000 mg | Freq: Four times a day (QID) | RECTAL | Status: DC | PRN

## 2018-08-25 MED ORDER — PANTOPRAZOLE SODIUM 40 MG PO TBEC
40.0000 mg | DELAYED_RELEASE_TABLET | Freq: Two times a day (BID) | ORAL | Status: DC
  Administered 2018-08-25 – 2018-08-29 (×7): 40 mg via ORAL
  Filled 2018-08-25 (×7): qty 1

## 2018-08-25 MED ORDER — UMECLIDINIUM BROMIDE 62.5 MCG/INH IN AEPB
1.0000 | INHALATION_SPRAY | Freq: Every day | RESPIRATORY_TRACT | Status: DC
  Administered 2018-08-26 – 2018-08-29 (×4): 1 via RESPIRATORY_TRACT
  Filled 2018-08-25: qty 7

## 2018-08-25 MED ORDER — GABAPENTIN 300 MG PO CAPS
300.0000 mg | ORAL_CAPSULE | Freq: Every day | ORAL | Status: DC
  Administered 2018-08-25 – 2018-08-28 (×4): 300 mg via ORAL
  Filled 2018-08-25 (×4): qty 1

## 2018-08-25 MED ORDER — TAMSULOSIN HCL 0.4 MG PO CAPS
0.4000 mg | ORAL_CAPSULE | Freq: Every day | ORAL | Status: DC
  Administered 2018-08-26 – 2018-08-29 (×4): 0.4 mg via ORAL
  Filled 2018-08-25 (×4): qty 1

## 2018-08-25 MED ORDER — LACTATED RINGERS IV SOLN
INTRAVENOUS | Status: DC
  Administered 2018-08-25 – 2018-08-27 (×4): via INTRAVENOUS

## 2018-08-25 MED ORDER — ACETAMINOPHEN 325 MG PO TABS
650.0000 mg | ORAL_TABLET | Freq: Four times a day (QID) | ORAL | Status: DC | PRN
  Administered 2018-08-28: 650 mg via ORAL
  Filled 2018-08-25: qty 2

## 2018-08-25 NOTE — ED Provider Notes (Signed)
[facility] EMERGENCY DEPARTMENT Provider Note   CSN: [id number] Arrival date & time: 08/25/18  0802    History   Chief Complaint Chief Complaint  Patient presents with  . Melena    HPI [name] is a 58 y.o. male.     HPI Patient presents with fatigue and black stools.  Is been going on for about a week.  States he has been more short of breath.  No chest pain.  He is on Eliquis for atrial fibrillation.  Also had somewhat recently had blood in his urine.  States he saw urology and has not found a cause.  Saw GI 1 week ago but that was before this bleeding started.  No abdominal pain.  No headache.  No confusion.  States he initially had fatigue with exercise and now feels even short of breath and fatigued at rest. Past Medical History:  Diagnosis Date  . Chest pain    a. 11/2002 Cath: EF 68%, LM nl, LAD small, nl, LCX nl, RCA tortuous/nl;  b. 08/2011 St Echo: EF 60%, normal contractility, no scar/ischemia.  Marland Kitchen COPD (chronic obstructive pulmonary disease) ([place])   . Enlarged prostate   . Hypertension   . Sickle cell trait ([place])   . Sleep apnea   . SVT (supraventricular tachycardia) [facility])     Patient Active Problem List   Diagnosis Date Noted  . Atypical chest pain 04/04/2017  . Abdominal pain 12/05/2013  . Diarrhea 12/05/2013  . Hypokalemia 12/05/2013  . Chest pain- atypical. Normal LHC 03/2013 03/10/2013  . HTN (hypertension) 03/10/2013  . Nonspecific abnormal electrocardiogram (ECG) (EKG) 03/10/2013    Past Surgical History:  Procedure Laterality Date  . LEFT HEART CATHETERIZATION WITH CORONARY ANGIOGRAM N/A 03/11/2013   Procedure: LEFT HEART CATHETERIZATION WITH CORONARY ANGIOGRAM;  Surgeon: Jolaine Artist, MD;  Location: [facility] CATH LAB;  Service: Cardiovascular;  Laterality: N/A;  . TONSILLECTOMY    . TRANSURETHRAL RESECTION OF PROSTATE          Home Medications    Prior to Admission medications   Medication Sig Start Date End Date Taking?  Authorizing Provider  amLODipine (NORVASC) 10 MG tablet Take 1 tablet (10 mg total) by mouth daily. 02/07/15   Charlesetta Shanks, MD  apixaban (ELIQUIS) 5 MG TABS tablet Take 5 mg by mouth 2 (two) times daily.    [provider]  benzonatate (TESSALON) 100 MG capsule Take 1 capsule (100 mg total) by mouth every 8 (eight) hours. 03/30/17   Isla Pence, MD  cetirizine (ZYRTEC) 10 MG tablet Take 10 mg by mouth daily.    [provider]  gabapentin (NEURONTIN) 300 MG capsule Take 300 mg by mouth at bedtime.    [provider]  metoprolol tartrate (LOPRESSOR) 50 MG tablet Take 25 mg by mouth 2 (two) times daily.    [provider]  nitroGLYCERIN (NITROSTAT) 0.4 MG SL tablet Place 1 tablet (0.4 mg total) under the tongue every 5 (five) minutes as needed for chest pain. 03/12/13   Thurnell Lose, MD  omeprazole (PRILOSEC) 40 MG capsule Take 40 mg by mouth daily.    [provider]  oxyCODONE (OXY IR/ROXICODONE) 5 MG immediate release tablet Take 1 tablet (5 mg total) by mouth every 6 (six) hours as needed for moderate pain or severe pain. 03/12/13   Thurnell Lose, MD  oxycodone-acetaminophen (LYNOX) 7.5-300 MG tablet Take 1 tablet by mouth every 8 (eight) hours as needed for pain.  [provider]  predniSONE (STERAPRED UNI-PAK 21 TAB) 10 MG (21) TBPK tablet Take 6 tabs by mouth daily  for 2 days, then 5 tabs for 2 days, then 4 tabs for 2 days, then 3 tabs for 2 days, 2 tabs for 2 days, then 1 tab by mouth daily for 2 days 03/30/17   Isla Pence, MD  sotalol (BETAPACE) 80 MG tablet Take 40 mg by mouth 2 (two) times daily.    [provider]  SUMAtriptan (IMITREX) 50 MG tablet Take 50-100 mg by mouth once as needed for migraine. 05/03/16   [provider]  tamsulosin (FLOMAX) 0.4 MG CAPS capsule Take 0.4 mg by mouth daily.    [provider]  tiotropium (SPIRIVA) 18 MCG inhalation capsule Place 18 mcg into inhaler and  inhale daily.    [provider]  traMADol (ULTRAM) 50 MG tablet Take 50 mg by mouth every 6 (six) hours as needed for moderate pain.    [provider]  triamterene-hydrochlorothiazide (DYAZIDE) 37.5-25 MG per capsule Take 1 each (1 capsule total) by mouth daily. 12/11/13   Barrett, Evelene Croon, PA-C  VESICARE 5 MG tablet Take 5 mg by mouth daily. 02/28/17   [provider]    Family History Family History  Problem Relation Age of Onset  . CAD Mother        died in her 10's - MI  . Multiple myeloma Brother   . Sickle cell anemia Father        died @ 90    Social History Social History   Tobacco Use  . Smoking status: Never Smoker  . Smokeless tobacco: Never Used  Substance Use Topics  . Alcohol use: No  . Drug use: No     Allergies   Patient has no known allergies.   Review of Systems Review of Systems  Constitutional: Positive for fatigue. Negative for appetite change.  HENT: Negative for congestion.   Respiratory: Positive for shortness of breath.   Cardiovascular: Negative for chest pain.  Gastrointestinal: Positive for blood in stool. Negative for abdominal pain, nausea and rectal pain.  Genitourinary: Positive for hematuria.  Musculoskeletal: Negative for back pain.  Skin: Negative for wound.  Neurological: Positive for weakness.  Psychiatric/Behavioral: Negative for confusion.     Physical Exam Updated Vital Signs BP (!) 135/94 (BP Location: Right Arm)   Pulse 80   Temp 98.2 F (36.8 C)   Resp 18   Ht 6' (1.829 m)   Wt 99.8 kg   SpO2 100%   BMI 29.84 kg/m   Physical Exam Vitals signs and nursing note reviewed.  HENT:     Head: Normocephalic.     Nose: Nose normal.  Eyes:     Pupils: Pupils are equal, round, and reactive to light.  Cardiovascular:     Rate and Rhythm: Regular rhythm.  Pulmonary:     Comments: Appears somewhat dyspneic, however no wheezes rales or rhonchi. Abdominal:     Tenderness: There is no  abdominal tenderness.  Genitourinary:    Comments: Dark stool.  No frank red blood. Skin:    General: Skin is warm.  Neurological:     Mental Status: Mental status is at baseline.      ED Treatments / Results  Labs (all labs ordered are listed, but only abnormal results are displayed) Labs Reviewed  CBC WITH DIFFERENTIAL/PLATELET - Abnormal; Notable for the following components:      Result Value   Hemoglobin  9.9 (*)    HCT 32.5 (*)    MCV 73.4 (*)    MCH 22.3 (*)    All other components within normal limits  COMPREHENSIVE METABOLIC PANEL - Abnormal; Notable for the following components:   Potassium 2.9 (*)    Glucose, Bld 117 (*)    Creatinine, Ser 1.43 (*)    GFR calc non Af Amer 53 (*)    All other components within normal limits  OCCULT BLOOD X 1 CARD TO LAB, STOOL - Abnormal; Notable for the following components:   Fecal Occult Bld POSITIVE (*)    All other components within normal limits  PROTIME-INR    EKG EKG Interpretation  Date/Time:  Tuesday Aug 25 2018 08:20:05 EDT Ventricular Rate:  79 PR Interval:    QRS Duration: 117 QT Interval:  416 QTC Calculation: 477 R Axis:   -62 Text Interpretation:  Sinus rhythm Borderline prolonged PR interval LAD, consider left anterior fascicular block Borderline T abnormalities, inferior leads No significant change since last tracing Confirmed by Davonna Belling [phone]) on 08/25/2018 8:30:00 AM   Radiology No results found.  Procedures Procedures (including critical care time)  Medications Ordered in ED Medications  pantoprazole (PROTONIX) 80 mg in sodium chloride 0.9 % 250 mL (0.32 mg/mL) infusion (has no administration in time range)  pantoprazole (PROTONIX) 40 MG injection (has no administration in time range)  pantoprazole (PROTONIX) 40 MG injection (has no administration in time range)     Initial Impression / Assessment and Plan / ED Course  I have reviewed the triage vital signs and the nursing  notes.  Pertinent labs & imaging results that were available during my care of the patient were reviewed by me and considered in my medical decision making (see chart for details).        Patient presents with fatigue black stools over the last week.  Hemoglobin 9.91 was 8.7 around a week ago.  He is on Eliquis for atrial fibrillation.  Guaiac positive with black stool.  Blood pressure is good and has normal BUN, however is definitely symptomatic with this and a short of breath with speaking.  Will admit to hospital.  Patient's gastroenterologist is associated with [facility].  Final Clinical Impressions(s) / ED Diagnoses   Final diagnoses:  Gastrointestinal hemorrhage, unspecified gastrointestinal hemorrhage type  Acute blood loss anemia    ED Discharge Orders    None       Davonna Belling, MD 08/25/18 [phone]

## 2018-08-25 NOTE — ED Notes (Signed)
Called [facility]

## 2018-08-25 NOTE — Progress Notes (Signed)
Patient with h/o COPD; HTN; BPH; afib on Eliquis; and OSA presenting with fatigue and tarry stools.  He was last seen by [facility] on 5/11 for GERD and chronic constipation.  Last EGD/colonoscopy was in 6/18; colonoscopy showed diverticulosis and internal hemorrhoids.   Hgb was 11.7 a week ago.  Heme positive, no red blood.  Hgb 9.9 now with dyspnea.  On Protonix 40 mg BID, started on a drip.  Not giving blood.  He is hemodynamically stable.  He has a h/o afib but is not currently in afib.  Will observe on med surg for now.    Of note, COVID tests done at [facility] are done with the Abbott test and need repeat with the Cepheid test here at [facility] upon arrival.    Carlyon Shadow, M.D.    Please call the Patient Placement RN of [facility] at [phone] when the patient arrives to floor.

## 2018-08-25 NOTE — H&P (Signed)
History and Physical    [name] WUX:[id number] DOB: [birth year] DOA: 08/25/2018  PCP: [name], MD Consultants:  Amalia Hailey - urology; Lillia Pauls - GI; Bhave - cardiology Patient coming from: Home - lives with son, 2 daughters, and 3 grandchildren; NOK: Children  Chief Complaint: Fatigue, tarry stools  HPI: [name] is a 58 y.o. male with medical history significant of COPD; HTN; BPH; afib on Eliquis; and OSA presenting to [facility] with fatigue and tarry stools.  He reports that about 3 weeks ago, he noticed bright red blood in his urine.  He called Dr. Amalia Hailey and had an appointment with a cystoscopy and CT without apparent explanation.  About a week later, he noticed black, tarry stools.  He initially thought it might be related to hemorrhoids.  He did not have any abdominal or rectal pain.  The tarry stools have been ongoing since, for about 1-2 weeks.  He feels very tired with exertion and fatigued.  He had to work hard to stand up just to void and decided it was time to get checked out.  He called GI (he is seen in [facility]) and reported that it felt hard for the food to go down.  He had more than a month of nausea.  He saw the PA, who thought that his "abdominal pain, cramping, increased reflux are related to his poorly controlled chronic constipation."  He was continued on BID Protonix and had labs ordered.  He was started on a bowel regimen.  He denies weight loss, has actually recently gained weight.   He denies h/o GI bleeding.  He does acknowledge prescription NSAID use - he is uncertain of the name and reports prn use with last use yesterday.    ED Course:  He was last seen by [facility] on 5/11 for GERD and chronic constipation.  Last EGD/colonoscopy was in 6/18; colonoscopy showed diverticulosis and internal hemorrhoids.   Hgb was 11.7 a week ago.  Heme positive, no red blood.  Hgb 9.9 now with dyspnea.  On Protonix 40 mg BID, started on a drip.  Not giving blood.  He is  hemodynamically stable.  He has a h/o afib but is not currently in afib.     Review of Systems: As per HPI; otherwise review of systems reviewed and negative.   Ambulatory Status:  Ambulates without assistance  Past Medical History:  Diagnosis Date  . Chest pain    a. 11/2002 Cath: EF 68%, LM nl, LAD small, nl, LCX nl, RCA tortuous/nl;  b. 08/2011 St Echo: EF 60%, normal contractility, no scar/ischemia.  Marland Kitchen COPD (chronic obstructive pulmonary disease) ([place])   . Enlarged prostate   . GERD (gastroesophageal reflux disease)   . Hypertension   . Sickle cell trait (Grandfather)   . Sleep apnea   . SVT (supraventricular tachycardia) (HCC)     Past Surgical History:  Procedure Laterality Date  . ATRIAL FIBRILLATION ABLATION    . LEFT HEART CATHETERIZATION WITH CORONARY ANGIOGRAM N/A 03/11/2013   Procedure: LEFT HEART CATHETERIZATION WITH CORONARY ANGIOGRAM;  Surgeon: Jolaine Artist, MD;  Location: [facility] CATH LAB;  Service: Cardiovascular;  Laterality: N/A;  . TONSILLECTOMY    . TRANSURETHRAL RESECTION OF PROSTATE      Social History   Socioeconomic History  . Marital status: Divorced    Spouse name: Not on file  . Number of children: Not on file  . Years of education: Not on file  . Highest education  level: Not on file  Occupational History  . Not on file  Social Needs  . Financial resource strain: Not on file  . Food insecurity:    Worry: Not on file    Inability: Not on file  . Transportation needs:    Medical: Not on file    Non-medical: Not on file  Tobacco Use  . Smoking status: Never Smoker  . Smokeless tobacco: Never Used  Substance and Sexual Activity  . Alcohol use: No  . Drug use: Yes    Types: Marijuana    Comment: reports smoking CBD  . Sexual activity: Never    Birth control/protection: None  Lifestyle  . Physical activity:    Days per week: Not on file    Minutes per session: Not on file  . Stress: Not on file  Relationships  . Social connections:    Talks  on phone: Not on file    Gets together: Not on file    Attends religious service: Not on file    Active member of club or organization: Not on file    Attends meetings of clubs or organizations: Not on file    Relationship status: Not on file  . Intimate partner violence:    Fear of current or ex partner: Not on file    Emotionally abused: Not on file    Physically abused: Not on file    Forced sexual activity: Not on file  Other Topics Concern  . Not on file  Social History Narrative   Lives in [place] with dtr.    No Known Allergies  Family History  Problem Relation Age of Onset  . CAD Mother        died in her 29's - MI  . Multiple myeloma Brother   . Sickle cell anemia Father        died @ 51  . Gastric cancer Neg Hx     Prior to Admission medications   Medication Sig Start Date End Date Taking? Authorizing Provider  amLODipine (NORVASC) 10 MG tablet Take 1 tablet (10 mg total) by mouth daily. 02/07/15   Charlesetta Shanks, MD  apixaban (ELIQUIS) 5 MG TABS tablet Take 5 mg by mouth 2 (two) times daily.    [provider]  benzonatate (TESSALON) 100 MG capsule Take 1 capsule (100 mg total) by mouth every 8 (eight) hours. 03/30/17   Isla Pence, MD  cetirizine (ZYRTEC) 10 MG tablet Take 10 mg by mouth daily.    [provider]  gabapentin (NEURONTIN) 300 MG capsule Take 300 mg by mouth at bedtime.    [provider]  metoprolol tartrate (LOPRESSOR) 50 MG tablet Take 25 mg by mouth 2 (two) times daily.    [provider]  nitroGLYCERIN (NITROSTAT) 0.4 MG SL tablet Place 1 tablet (0.4 mg total) under the tongue every 5 (five) minutes as needed for chest pain. 03/12/13   Thurnell Lose, MD  omeprazole (PRILOSEC) 40 MG capsule Take 40 mg by mouth daily.    [provider]  oxyCODONE (OXY IR/ROXICODONE) 5 MG immediate release tablet Take 1 tablet (5 mg total) by mouth every 6 (six) hours as needed for moderate pain or severe pain. 03/12/13    Thurnell Lose, MD  oxycodone-acetaminophen (LYNOX) 7.5-300 MG tablet Take 1 tablet by mouth every 8 (eight) hours as needed for pain.    [provider]  predniSONE (STERAPRED UNI-PAK 21 TAB) 10 MG (21) TBPK tablet Take 6 tabs  by mouth daily  for 2 days, then 5 tabs for 2 days, then 4 tabs for 2 days, then 3 tabs for 2 days, 2 tabs for 2 days, then 1 tab by mouth daily for 2 days 03/30/17   Isla Pence, MD  sotalol (BETAPACE) 80 MG tablet Take 40 mg by mouth 2 (two) times daily.    [provider]  SUMAtriptan (IMITREX) 50 MG tablet Take 50-100 mg by mouth once as needed for migraine. 05/03/16   [provider]  tamsulosin (FLOMAX) 0.4 MG CAPS capsule Take 0.4 mg by mouth daily.    [provider]  tiotropium (SPIRIVA) 18 MCG inhalation capsule Place 18 mcg into inhaler and inhale daily.    [provider]  traMADol (ULTRAM) 50 MG tablet Take 50 mg by mouth every 6 (six) hours as needed for moderate pain.    [provider]  triamterene-hydrochlorothiazide (DYAZIDE) 37.5-25 MG per capsule Take 1 each (1 capsule total) by mouth daily. 12/11/13   Barrett, Evelene Croon, PA-C  VESICARE 5 MG tablet Take 5 mg by mouth daily. 02/28/17   [provider]    Physical Exam: Vitals:   08/25/18 0952 08/25/18 1000 08/25/18 1030 08/25/18 1130  BP: (!) 139/91 (!) 128/92 (!) 121/93 (!) 126/109  Pulse: 76 66 61 (!) 103  Resp:  12 15 (!) 29  Temp:      SpO2: 100% 97% 97% 100%  Weight:      Height:         . General:  Appears calm and comfortable and is NAD . Eyes:  EOMI, normal lids, iris . ENT:  grossly normal hearing, lips & tongue, mmm . Neck:  no LAD, masses or thyromegaly . Cardiovascular:  RRR, no m/r/g. No LE edema.  Marland Kitchen Respiratory:   CTA bilaterally with no wheezes/rales/rhonchi.  Normal respiratory effort. . Abdomen:  soft, NT, ND, NABS . Skin:  no rash or induration seen on limited exam . Musculoskeletal:  grossly normal tone  BUE/BLE, good ROM, no bony abnormality . Psychiatric:  grossly normal mood and affect, speech fluent and appropriate, AOx3 . Neurologic:  CN 2-12 grossly intact, moves all extremities in coordinated fashion, sensation intact    Radiological Exams on Admission: No results found.  EKG: Independently reviewed.  NSR with rate 79; nonspecific ST changes with no evidence of acute ischemia; NSCSLT   Labs on Admission: I have personally reviewed the available labs and imaging studies at the time of the admission.  Pertinent labs:   K+ 2.9 Glucose 117 BUN 18/Creatinine 1.43/GFR 53 - stable WBC 7.0 Hgb 9.9; 11.7 on 5/11 INR 1.6 Heme positive  Assessment/Plan Principal Problem:   Upper GI bleeding Active Problems:   HTN (hypertension)   CKD (chronic kidney disease) stage 3, GFR 30-59 ml/min (HCC)   PAF (paroxysmal atrial fibrillation) (HCC)   UGI bleeding -Patient's fatigue is most likely caused by progressive but still mild anemia secondary to upper GI bleeding.  -Patient has no prior h/o GI bleeding but reports >1 month of nausea with dysphagia and 1-2 weeks of tarry stools -He has been taking 600 mg Ibuprofen prn pain -Most likely differential diagnoses is gastritis or esophagitis or gastric or duodenal ulcer -His Hgb decreased to 9.9 today; will trend q8h for now.  -will observe for now on Med Surg - GI consulted, will follow up recommendations - Plan for  EGD on 5/21, after Xarelto washout, per GI - LR at 75 mL/hr - Start IV  pantoprazole drip (changed to PO BID with Pepcid qhs by GI) - Zofran IV for nausea - Avoid NSAIDs and SQ heparin - Monitor closely and transfuse as necessary for Hgb <7  Afib, on Xarelto -Rate controlled with Sotalol -Hold Xarelto in the setting of active GI bleeding  HTN -Continue Norvasc, Sotalol -Hold Dyazide for now, consider resumption in the next 1-2 days depending on BP control  CKD -Appears to be generally stable -Dyazide may not be his  best BP option, but will defer to PCP  Chronic pain -Despite MAR showing both Oxy IR and Lynox (oxy 7.5 with 300 mg APAP), the [place] Controlled Substances Registry does not show that the patient is receiving narcotics for pain control -Will continue Ultram and Oxy IR for now, but will need to readdress this issue at the time of d/c    Note: This patient has been tested and is negative for the novel coronavirus COVID-19.  DVT prophylaxis:  SCDs Code Status:  Full - confirmed with patient Family Communication: None present Disposition Plan:  Home once clinically improved Consults called: GI  Admission status: It is my clinical opinion that referral for OBSERVATION is reasonable and necessary in this patient based on the above information provided. The aforementioned taken together are felt to place the patient at high risk for further clinical deterioration. However it is anticipated that the patient may be medically stable for discharge from the hospital within 24 to 48 hours.     Karmen Bongo MD [facility]   How to contact the [facility] Attending or Consulting provider [place] or covering provider during after hours [place], for this patient?  1. Check the care team in [facility] and look for a) attending/consulting [facility] provider listed and b) the [facility] team listed 2. Log into [email or web address] and use [place]'s universal password to access. If you do not have the password, please contact the hospital operator. 3. Locate the [facility] provider you are looking for under [facility] and page to a number that you can be directly reached. 4. If you still have difficulty reaching the provider, please page the [facility] (Director on Call) for the Hospitalists listed on amion for assistance.   08/25/2018, 2:43 PM

## 2018-08-25 NOTE — Consult Note (Signed)
[facility] Consult: 2:05 PM 08/25/2018  LOS: 0 days    Referring Provider: Karmen Bongo MD Primary Care Physician:  [name], MD Primary Gastroenterologist:  Dr. Mitchell Heir in [facility]     Reason for Consultation: Anemia, tarry stools/FOBT +   HPI: [name] is a 58 y.o. male.  COPD.  OSA.  SS trait.  SVT.  Migraines. A fib. Previous ablation but suffers ongoing PAF. On Eliquis.  Goiter, nodular hyperplasia on bx 2009 and 2014.    Within the past several weeks, his urologist performed a CT scan and then cystoscopy for evaluation of 3 days of gross hematuria.  No source for the hematuria was found and it has not recurred.. GERD.   Colon Polyps.  Constipation, on Linzess.     GERD EGD/Colonoscopy in 09/2016 due to GERD, dysphagia, blood in stool, anemia, hx colon polyps.  09/2016 EGD revealed a small Schatzki's ring; biopsies negative for sprue or EoE.  09/2016 Colonoscopy with tics and internal hemorrhoids; random colon biopsies negative. Due for a colonoscopy 09/2021. 11/2016 Esoph Manometry.  Non-specific contractions.    08/17/18 GI OV for c/o poorly controlled GERD with nausea and refluxate, sour burping, solid>> liquid dysphagia.  Despite Pantoprazole 40 mg/BID, ranitidine 300 mg at HS.  PRN Tums help.  His reflux symptoms have been happening for a few years.  Stable bms 1 to 2 x weekly recent resumption of daily Linzess..  Occasional minor bleeding from hemorrhoids.  A little over 1 week ago he developed tarry, black stools twice daily, is easily fatigued with minor exertion. Sides the GI meds listed above he takes pain meds which include tramadol, oxycodone and Flexeril.  Does not use any NSAIDs or over-the-counter aspirin products.  Hgb 9.9.  Was 15.6 in 11/2017, 11.7/MCV 71 on 5/11.   Total  iron was 40 (RR is 50-212), ferritin 6, low iron saturation, TIBC normal on 5/11.   FOBT +.   BUN not elevated.   INR 1.6.  Covid 19 negative.     96.2 kg in 10/2017.    No alcohol consumption, no illicit drug use. Patient's father had sickle cell disease, he died at a young age and apparently had "stomach problems".  No family history colon, esophageal, gastric cancers.    Past Medical History:  Diagnosis Date  . Chest pain    a. 11/2002 Cath: EF 68%, LM nl, LAD small, nl, LCX nl, RCA tortuous/nl;  b. 08/2011 St Echo: EF 60%, normal contractility, no scar/ischemia.  Marland Kitchen COPD (chronic obstructive pulmonary disease) ([place])   . Enlarged prostate   . Hypertension   . Sickle cell trait ([place])   . Sleep apnea   . SVT (supraventricular tachycardia) (HCC)     Past Surgical History:  Procedure Laterality Date  . LEFT HEART CATHETERIZATION WITH CORONARY ANGIOGRAM N/A 03/11/2013   Procedure: LEFT HEART CATHETERIZATION WITH CORONARY ANGIOGRAM;  Surgeon: Jolaine Artist, MD;  Location: [facility] CATH LAB;  Service: Cardiovascular;  Laterality: N/A;  . TONSILLECTOMY    . TRANSURETHRAL RESECTION OF  PROSTATE      Prior to Admission medications   Medication Sig Start Date End Date Taking? Authorizing Provider  amLODipine (NORVASC) 10 MG tablet Take 1 tablet (10 mg total) by mouth daily. 02/07/15   Charlesetta Shanks, MD  apixaban (ELIQUIS) 5 MG TABS tablet Take 5 mg by mouth 2 (two) times daily.    [provider]  benzonatate (TESSALON) 100 MG capsule Take 1 capsule (100 mg total) by mouth every 8 (eight) hours. 03/30/17   Isla Pence, MD  cetirizine (ZYRTEC) 10 MG tablet Take 10 mg by mouth daily.    [provider]  gabapentin (NEURONTIN) 300 MG capsule Take 300 mg by mouth at bedtime.    [provider]  metoprolol tartrate (LOPRESSOR) 50 MG tablet Take 25 mg by mouth 2 (two) times daily.    [provider]  nitroGLYCERIN (NITROSTAT) 0.4 MG SL tablet Place 1 tablet  (0.4 mg total) under the tongue every 5 (five) minutes as needed for chest pain. 03/12/13   Thurnell Lose, MD  omeprazole (PRILOSEC) 40 MG capsule Take 40 mg by mouth daily.    [provider]  oxyCODONE (OXY IR/ROXICODONE) 5 MG immediate release tablet Take 1 tablet (5 mg total) by mouth every 6 (six) hours as needed for moderate pain or severe pain. 03/12/13   Thurnell Lose, MD  oxycodone-acetaminophen (LYNOX) 7.5-300 MG tablet Take 1 tablet by mouth every 8 (eight) hours as needed for pain.    [provider]  predniSONE (STERAPRED UNI-PAK 21 TAB) 10 MG (21) TBPK tablet Take 6 tabs by mouth daily  for 2 days, then 5 tabs for 2 days, then 4 tabs for 2 days, then 3 tabs for 2 days, 2 tabs for 2 days, then 1 tab by mouth daily for 2 days 03/30/17   Isla Pence, MD  sotalol (BETAPACE) 80 MG tablet Take 40 mg by mouth 2 (two) times daily.    [provider]  SUMAtriptan (IMITREX) 50 MG tablet Take 50-100 mg by mouth once as needed for migraine. 05/03/16   [provider]  tamsulosin (FLOMAX) 0.4 MG CAPS capsule Take 0.4 mg by mouth daily.    [provider]  tiotropium (SPIRIVA) 18 MCG inhalation capsule Place 18 mcg into inhaler and inhale daily.    [provider]  traMADol (ULTRAM) 50 MG tablet Take 50 mg by mouth every 6 (six) hours as needed for moderate pain.    [provider]  triamterene-hydrochlorothiazide (DYAZIDE) 37.5-25 MG per capsule Take 1 each (1 capsule total) by mouth daily. 12/11/13   Barrett, Evelene Croon, PA-C  VESICARE 5 MG tablet Take 5 mg by mouth daily. 02/28/17   [provider]    Scheduled Meds:  Infusions: . pantoprozole (PROTONIX) infusion 8 mg/hr (08/25/18 0912)   PRN Meds:    Allergies as of 08/25/2018  . (No Known Allergies)    Family History  Problem Relation Age of Onset  . CAD Mother        died in her 44's - MI  . Multiple myeloma Brother   . Sickle cell anemia Father         died @ 84    Social History   Socioeconomic History  . Marital status: Divorced    Spouse name: Not on file  . Number of children: Not on file  . Years of education: Not on file  . Highest education level: Not on file  Occupational History  .  Not on file  Social Needs  . Financial resource strain: Not on file  . Food insecurity:    Worry: Not on file    Inability: Not on file  . Transportation needs:    Medical: Not on file    Non-medical: Not on file  Tobacco Use  . Smoking status: Never Smoker  . Smokeless tobacco: Never Used  Substance and Sexual Activity  . Alcohol use: No  . Drug use: No  . Sexual activity: Never    Birth control/protection: None  Lifestyle  . Physical activity:    Days per week: Not on file    Minutes per session: Not on file  . Stress: Not on file  Relationships  . Social connections:    Talks on phone: Not on file    Gets together: Not on file    Attends religious service: Not on file    Active member of club or organization: Not on file    Attends meetings of clubs or organizations: Not on file    Relationship status: Not on file  . Intimate partner violence:    Fear of current or ex partner: Not on file    Emotionally abused: Not on file    Physically abused: Not on file    Forced sexual activity: Not on file  Other Topics Concern  . Not on file  Social History Narrative   Lives in [place] with dtr.    REVIEW OF SYSTEMS: Constitutional: Weakness, fatigue as per HPI. ENT:  No nose bleeds Pulm: DOE. CV:  No palpitations, no LE edema.  GU:  No hematuria, no frequency GI: See HPI. Heme: Denies unusual bleeding or bruising.  Does not recall having been prescribed or taking iron supplements. Transfusions: No recall of blood transfusions. Neuro:  No headaches, no peripheral tingling or numbness Derm:  No itching, no rash or sores.  Endocrine:  No sweats or chills.  No polyuria or dysuria Immunization: Not queried. Travel:  None beyond  local counties in last few months.    PHYSICAL EXAM: Vital signs in last 24 hours: Vitals:   08/25/18 1030 08/25/18 1130  BP: (!) 121/93 (!) 126/109  Pulse: 61 (!) 103  Resp: 15 (!) 29  Temp:    SpO2: 97% 100%   Wt Readings from Last 3 Encounters:  08/25/18 99.8 kg  04/05/17 94.6 kg  03/30/17 93 kg    General: Pleasant, looks well.  Eating in bed comfortable. Head: No facial asymmetry or swelling.  No signs of head trauma. Eyes: No conjunctival pallor.  No scleral icterus Ears: No hearing loss. Nose: No congestion or discharge Mouth: Multiple absent teeth, remainder of teeth not in great condition.  Tongue midline.  Mucosa pink, moist, clear. Neck: No JVD, no masses. Lungs: Clear bilaterally with good breath sounds.  No labored breathing.  No cough. Heart: RRR.  No MRG.  S1, S2 present. Abdomen: Soft.  Not distended.  Active bowel sounds.  No HSM, masses, bruits, hernias.  Slight right upper quadrant tenderness without guarding or rebound (this is the chronic problem he has had for years).   Rectal: Deferred.  In the ED he had dark stool which was FOBT positive. Musc/Skeltl: No joint redness, swelling or significant deformity. Extremities: No CCE.  Feet are warm. Neurologic: Alert.  Oriented x3.  Moves all 4 limbs, strength not tested.  No tremors. Skin: No rashes, sores, suspicious lesions.   Psych: Pleasant, calm, cooperative.  Intake/Output from previous day: No intake/output  data recorded. Intake/Output this shift: No intake/output data recorded.  LAB RESULTS: Recent Labs    08/25/18 0821  WBC 7.0  HGB 9.9*  HCT 32.5*  PLT 277   BMET Lab Results  Component Value Date   NA 140 08/25/2018   NA 139 12/03/2017   NA 140 04/05/2017   K 2.9 (L) 08/25/2018   K 3.4 (L) 12/03/2017   K 3.8 04/05/2017   CL 105 08/25/2018   CL 103 12/03/2017   CL 104 04/05/2017   CO2 25 08/25/2018   CO2 25 12/03/2017   CO2 28 04/05/2017   GLUCOSE 117 (H) 08/25/2018   GLUCOSE  110 (H) 12/03/2017   GLUCOSE 84 04/05/2017   BUN 18 08/25/2018   BUN 13 12/03/2017   BUN 13 04/05/2017   CREATININE 1.43 (H) 08/25/2018   CREATININE 1.47 (H) 12/03/2017   CREATININE 1.34 (H) 04/05/2017   CALCIUM 9.4 08/25/2018   CALCIUM 9.6 12/03/2017   CALCIUM 8.6 (L) 04/05/2017   LFT Recent Labs    08/25/18 0821  PROT 8.1  ALBUMIN 4.2  AST 30  ALT 22  ALKPHOS 72  BILITOT 0.6   PT/INR Lab Results  Component Value Date   INR 1.6 (H) 08/25/2018   INR 0.93 02/07/2015   INR 0.97 12/05/2013     RADIOLOGY STUDIES: No results found.    IMPRESSION:   *    Black, tarry, FOBT positive stools for about a week Unlikely that the patient has ulcer disease given all the acid suppression meds he takes. ? stomach or intestinal AVMs causing GI bleeding.  *   Acute anemia, suspected GI blood loss is the cause.  Hgb drop nearly 2 g in 8 days.    Sickle cell trait. Iron deficient as of studies 08/17/2018.  *    Refractory GERD symptoms despite maximum Protonix, high-dose nocturnal acid suppression.   Established diagnosis of esophageal dysmotility may be a culprit here.  *   Longstanding abdominal pain.  Right upper quadrant.  LFTs normal. Urologic CT abdomen 08/13/2018 showed regional hypodensity in right hepatic lobe, suggesting hepatic steatosis, normal pancreas, spleen, gallbladder, biliary tree.   PLAN:     *   Plan EGD after 2-day washout of Eliquis, which puts the procedure at 5/21 since he took Eliquis this morning.  *   Okay for patient to eat, Dr. Lorin Mercy ordered the patient a diet.   *    As it is highly unlikely this patient has ulcer disease, I stopped the Protonix drip.  Resuming oral, twice daily Protonix and nocturnal H2 blocker   Azucena Freed  08/25/2018, 2:05 PM Phone [phone]

## 2018-08-25 NOTE — ED Triage Notes (Signed)
C/o tarry stools, weakness and fatigue  X 1 week  denies pain

## 2018-08-26 ENCOUNTER — Observation Stay (HOSPITAL_COMMUNITY): Payer: Medicare Other

## 2018-08-26 DIAGNOSIS — E876 Hypokalemia: Secondary | ICD-10-CM | POA: Diagnosis present

## 2018-08-26 DIAGNOSIS — K5731 Diverticulosis of large intestine without perforation or abscess with bleeding: Secondary | ICD-10-CM | POA: Diagnosis not present

## 2018-08-26 DIAGNOSIS — D123 Benign neoplasm of transverse colon: Secondary | ICD-10-CM | POA: Diagnosis not present

## 2018-08-26 DIAGNOSIS — K5900 Constipation, unspecified: Secondary | ICD-10-CM | POA: Diagnosis not present

## 2018-08-26 DIAGNOSIS — D62 Acute posthemorrhagic anemia: Secondary | ICD-10-CM | POA: Diagnosis not present

## 2018-08-26 DIAGNOSIS — D573 Sickle-cell trait: Secondary | ICD-10-CM | POA: Diagnosis present

## 2018-08-26 DIAGNOSIS — K635 Polyp of colon: Secondary | ICD-10-CM | POA: Diagnosis present

## 2018-08-26 DIAGNOSIS — I10 Essential (primary) hypertension: Secondary | ICD-10-CM | POA: Diagnosis not present

## 2018-08-26 DIAGNOSIS — K224 Dyskinesia of esophagus: Secondary | ICD-10-CM | POA: Diagnosis not present

## 2018-08-26 DIAGNOSIS — Q438 Other specified congenital malformations of intestine: Secondary | ICD-10-CM | POA: Diagnosis not present

## 2018-08-26 DIAGNOSIS — G4733 Obstructive sleep apnea (adult) (pediatric): Secondary | ICD-10-CM | POA: Diagnosis present

## 2018-08-26 DIAGNOSIS — I480 Paroxysmal atrial fibrillation: Secondary | ICD-10-CM | POA: Diagnosis present

## 2018-08-26 DIAGNOSIS — K921 Melena: Secondary | ICD-10-CM | POA: Diagnosis not present

## 2018-08-26 DIAGNOSIS — N183 Chronic kidney disease, stage 3 (moderate): Secondary | ICD-10-CM | POA: Diagnosis present

## 2018-08-26 DIAGNOSIS — K5730 Diverticulosis of large intestine without perforation or abscess without bleeding: Secondary | ICD-10-CM | POA: Diagnosis not present

## 2018-08-26 DIAGNOSIS — J449 Chronic obstructive pulmonary disease, unspecified: Secondary | ICD-10-CM | POA: Diagnosis present

## 2018-08-26 DIAGNOSIS — K922 Gastrointestinal hemorrhage, unspecified: Secondary | ICD-10-CM | POA: Diagnosis not present

## 2018-08-26 DIAGNOSIS — Z1159 Encounter for screening for other viral diseases: Secondary | ICD-10-CM | POA: Diagnosis not present

## 2018-08-26 DIAGNOSIS — N400 Enlarged prostate without lower urinary tract symptoms: Secondary | ICD-10-CM | POA: Diagnosis present

## 2018-08-26 DIAGNOSIS — K5909 Other constipation: Secondary | ICD-10-CM | POA: Diagnosis present

## 2018-08-26 DIAGNOSIS — K31819 Angiodysplasia of stomach and duodenum without bleeding: Secondary | ICD-10-CM | POA: Diagnosis not present

## 2018-08-26 DIAGNOSIS — I5032 Chronic diastolic (congestive) heart failure: Secondary | ICD-10-CM | POA: Diagnosis not present

## 2018-08-26 DIAGNOSIS — K648 Other hemorrhoids: Secondary | ICD-10-CM | POA: Diagnosis not present

## 2018-08-26 DIAGNOSIS — I890 Lymphedema, not elsewhere classified: Secondary | ICD-10-CM | POA: Diagnosis present

## 2018-08-26 DIAGNOSIS — G8929 Other chronic pain: Secondary | ICD-10-CM | POA: Diagnosis present

## 2018-08-26 DIAGNOSIS — K2981 Duodenitis with bleeding: Secondary | ICD-10-CM | POA: Diagnosis not present

## 2018-08-26 DIAGNOSIS — K222 Esophageal obstruction: Secondary | ICD-10-CM | POA: Diagnosis not present

## 2018-08-26 DIAGNOSIS — I130 Hypertensive heart and chronic kidney disease with heart failure and stage 1 through stage 4 chronic kidney disease, or unspecified chronic kidney disease: Secondary | ICD-10-CM | POA: Diagnosis not present

## 2018-08-26 DIAGNOSIS — K449 Diaphragmatic hernia without obstruction or gangrene: Secondary | ICD-10-CM | POA: Diagnosis not present

## 2018-08-26 DIAGNOSIS — K219 Gastro-esophageal reflux disease without esophagitis: Secondary | ICD-10-CM | POA: Diagnosis present

## 2018-08-26 LAB — IRON AND TIBC
Iron: 18 ug/dL — ABNORMAL LOW (ref 45–182)
Saturation Ratios: 4 % — ABNORMAL LOW (ref 17.9–39.5)
TIBC: 413 ug/dL (ref 250–450)
UIBC: 395 ug/dL

## 2018-08-26 LAB — CBC
HCT: 31.1 % — ABNORMAL LOW (ref 39.0–52.0)
Hemoglobin: 9.8 g/dL — ABNORMAL LOW (ref 13.0–17.0)
MCH: 22.5 pg — ABNORMAL LOW (ref 26.0–34.0)
MCHC: 31.5 g/dL (ref 30.0–36.0)
MCV: 71.5 fL — ABNORMAL LOW (ref 80.0–100.0)
Platelets: 285 10*3/uL (ref 150–400)
RBC: 4.35 MIL/uL (ref 4.22–5.81)
RDW: 15.5 % (ref 11.5–15.5)
WBC: 8.1 10*3/uL (ref 4.0–10.5)
nRBC: 0 % (ref 0.0–0.2)

## 2018-08-26 LAB — BASIC METABOLIC PANEL
Anion gap: 5 (ref 5–15)
Anion gap: 10 (ref 5–15)
BUN: 12 mg/dL (ref 6–20)
BUN: 13 mg/dL (ref 6–20)
CO2: 26 mmol/L (ref 22–32)
CO2: 24 mmol/L (ref 22–32)
Calcium: 8.8 mg/dL — ABNORMAL LOW (ref 8.9–10.3)
Calcium: 9.1 mg/dL (ref 8.9–10.3)
Chloride: 107 mmol/L (ref 98–111)
Chloride: 105 mmol/L (ref 98–111)
Creatinine, Ser: 1.38 mg/dL — ABNORMAL HIGH (ref 0.61–1.24)
Creatinine, Ser: 1.39 mg/dL — ABNORMAL HIGH (ref 0.61–1.24)
GFR calc Af Amer: >60 mL/min (ref >60)
GFR calc Af Amer: >60 mL/min (ref >60)
GFR calc non Af Amer: 56 mL/min — ABNORMAL LOW (ref >60)
GFR calc non Af Amer: 55 mL/min — ABNORMAL LOW (ref >60)
Glucose, Bld: 111 mg/dL — ABNORMAL HIGH (ref 70–99)
Glucose, Bld: 120 mg/dL — ABNORMAL HIGH (ref 70–99)
Potassium: 2.9 mmol/L — ABNORMAL LOW (ref 3.5–5.1)
Potassium: 3.4 mmol/L — ABNORMAL LOW (ref 3.5–5.1)
Sodium: 138 mmol/L (ref 135–145)
Sodium: 139 mmol/L (ref 135–145)

## 2018-08-26 LAB — HIV ANTIBODY (ROUTINE TESTING W REFLEX): HIV Screen 4th Generation wRfx: NONREACTIVE

## 2018-08-26 LAB — FERRITIN: Ferritin: 5 ng/mL — ABNORMAL LOW (ref 24–336)

## 2018-08-26 LAB — RETICULOCYTES
Immature Retic Fract: 30.3 % — ABNORMAL HIGH (ref 2.3–15.9)
RBC.: 4.28 MIL/uL (ref 4.22–5.81)
Retic Count, Absolute: 64.2 10*3/uL (ref 19.0–186.0)
Retic Ct Pct: 1.5 % (ref 0.4–3.1)

## 2018-08-26 LAB — VITAMIN B12: Vitamin B-12: 236 pg/mL (ref 180–914)

## 2018-08-26 LAB — FOLATE: Folate: 11.2 ng/mL (ref >5.9)

## 2018-08-26 MED ORDER — LINACLOTIDE 145 MCG PO CAPS: 145.0000 ug | ORAL_CAPSULE | Freq: Every day | ORAL | Status: DC

## 2018-08-26 MED ORDER — POLYETHYLENE GLYCOL 3350 17 G PO PACK
17.0000 g | PACK | Freq: Two times a day (BID) | ORAL | Status: DC
  Administered 2018-08-26 – 2018-08-28 (×4): 17 g via ORAL
  Filled 2018-08-26 (×6): qty 1

## 2018-08-26 MED ORDER — BISACODYL 10 MG RE SUPP: 10.0000 mg | Freq: Every day | RECTAL | Status: DC | PRN

## 2018-08-26 MED ORDER — CYANOCOBALAMIN 1000 MCG/ML IJ SOLN
1000.0000 ug | Freq: Once | INTRAMUSCULAR | Status: AC
  Administered 2018-08-26: 1000 ug via INTRAMUSCULAR
  Filled 2018-08-26: qty 1

## 2018-08-26 MED ORDER — SODIUM CHLORIDE 0.9 % IV SOLN: 510.0000 mg | Freq: Once | INTRAVENOUS | Status: DC

## 2018-08-26 MED ORDER — SODIUM CHLORIDE 0.9 % IV SOLN
510.0000 mg | Freq: Once | INTRAVENOUS | Status: AC
  Administered 2018-08-26: 510 mg via INTRAVENOUS
  Filled 2018-08-26: qty 17

## 2018-08-26 MED ORDER — POTASSIUM CHLORIDE CRYS ER 20 MEQ PO TBCR
40.0000 meq | EXTENDED_RELEASE_TABLET | Freq: Once | ORAL | Status: AC
  Administered 2018-08-26: 40 meq via ORAL
  Filled 2018-08-26: qty 2

## 2018-08-26 MED ORDER — LINACLOTIDE 145 MCG PO CAPS
290.0000 ug | ORAL_CAPSULE | Freq: Every day | ORAL | Status: DC
  Administered 2018-08-26 – 2018-08-29 (×3): 290 ug via ORAL
  Filled 2018-08-26 (×4): qty 2

## 2018-08-26 NOTE — Progress Notes (Signed)
PROGRESS NOTE    [name]  KWI:[id number] DOB: 1960/12/30 DOA: 08/25/2018 PCP: [name], MD   Brief Narrative: 58 year old with past medical history significant for COPD, hypertension, BPH, A. fib on Xarelto who presents with fatigue, shortness of breath and tarry stool.  And has been having trouble with constipation, he he saw his gastroenterologist was prescribed bowel prep.  He also was evaluated by his urologist for hematuria with negative work-up.   Assessment & Plan:   Principal Problem:   Upper GI bleeding Active Problems:   HTN (hypertension)   CKD (chronic kidney disease) stage 3, GFR 30-59 ml/min (HCC)   PAF (paroxysmal atrial fibrillation) (HCC)  1-Upper GI bleed: Patient present with anemia, shortness of breath, tarry stool. Was advised to stop using NSAIDs. With PPI twice daily. Plan for endoscopy on 5/20 first after Xarelto washout. Need to monitor hemoglobin level. Occult blood positive 2-anemia, iron deficiency probably in the setting of GI bleed. Iron level low.  Will provide IV iron B12 low normal, will provide intramuscular injection of B12.  3-shortness of breath: Could be related to anemia.  We will also check a chest x-ray.  4-constipation: We will check KUB.  Might need enema or further laxative. Will defer to GI colonoscopy.  A. fib on Xarelto: Rate controlled on sotalol continue. Patient will require GI evaluation prior to resumption of Xarelto.  Hypertension: Continue with sotalol.  will hold Norvasc Chronic kidney disease stage II stable  Estimated body mass index is 29.84 kg/m as calculated from the following:   Height as of this encounter: 6' (1.829 m).   Weight as of this encounter: 99.8 kg.   DVT prophylaxis: SCDs no anticoagulation due to concern for GI bleed Code Status: Full code Family Communication: Care discussed with patient Disposition Plan: Patient need to remain in the hospital for further monitoring of  hemoglobin and monitor for GI bleed.  He will require endoscopy study prior to discharge to be able to determine safest time for anticoagulation resumption   Consultants:  GI  Procedures: None  Antimicrobials: None  Subjective: Patient report poor oral intake, report poor appetite. No eating enough.  She also report SOB on exertion, feeling tired. Denies chest pain.  He report constipation, last good BM 2 days ago.   Objective: Vitals:   08/25/18 1459 08/25/18 1619 08/25/18 2259 08/26/18 0735  BP: 134/89 123/82 118/80 118/85  Pulse: 67 68 70 62  Resp: 20   18  Temp: 98.7 F (37.1 C) 98.3 F (36.8 C) (!) 97.5 F (36.4 C) 97.6 F (36.4 C)  TempSrc: Oral Oral Oral Oral  SpO2:  100% 100%   Weight:      Height:        Intake/Output Summary (Last 24 hours) at 08/26/2018 0910 Last data filed at 08/26/2018 2426 Gross per 24 hour  Intake 1176.7 ml  Output 900 ml  Net 276.7 ml   Filed Weights   08/25/18 0813  Weight: 99.8 kg    Examination:  General exam: Appears calm and comfortable  Respiratory system: Clear to auscultation. Respiratory effort normal. Cardiovascular system: S1 & S2 heard, RRR. No JVD, murmurs, rubs, gallops or clicks. No pedal edema. Gastrointestinal system: Abdomen is nondistended, soft and nontender. No organomegaly or masses felt. Normal bowel sounds heard. Central nervous system: Alert and oriented. No focal neurological deficits. Extremities: Symmetric 5 x 5 power. Skin: No rashes, lesions or ulcers Psychiatry: Judgement and insight appear normal. Mood & affect appropriate.  Data Reviewed: I have personally reviewed following labs and imaging studies  CBC: Recent Labs  Lab 08/25/18 0821 08/25/18 1533 08/25/18 2329 08/26/18 0758  WBC 7.0 8.3 8.0 8.1  NEUTROABS 3.6  --   --   --   HGB 9.9* 9.9* 8.8* 9.8*  HCT 32.5* 31.1* 27.6* 31.1*  MCV 73.4* 71.8* 71.3* 71.5*  PLT 277 248 267 481   Basic Metabolic Panel: Recent Labs  Lab  08/25/18 0821 08/25/18 2329 08/26/18 0758  NA 140 138 139  K 2.9* 2.9* 3.4*  CL 105 107 105  CO2 25 26 24   GLUCOSE 117* 111* 120*  BUN 18 12 13   CREATININE 1.43* 1.38* 1.39*  CALCIUM 9.4 8.8* 9.1   GFR: Estimated Creatinine Clearance: 70 mL/min (A) (by C-G formula based on SCr of 1.39 mg/dL (H)). Liver Function Tests: Recent Labs  Lab 08/25/18 0821  AST 30  ALT 22  ALKPHOS 72  BILITOT 0.6  PROT 8.1  ALBUMIN 4.2   No results for input(s): LIPASE, AMYLASE in the last 168 hours. No results for input(s): AMMONIA in the last 168 hours. Coagulation Profile: Recent Labs  Lab 08/25/18 0856  INR 1.6*   Cardiac Enzymes: No results for input(s): CKTOTAL, CKMB, CKMBINDEX, TROPONINI in the last 168 hours. BNP (last 3 results) No results for input(s): PROBNP in the last 8760 hours. HbA1C: No results for input(s): HGBA1C in the last 72 hours. CBG: No results for input(s): GLUCAP in the last 168 hours. Lipid Profile: No results for input(s): CHOL, HDL, LDLCALC, TRIG, CHOLHDL, LDLDIRECT in the last 72 hours. Thyroid Function Tests: No results for input(s): TSH, T4TOTAL, FREET4, T3FREE, THYROIDAB in the last 72 hours. Anemia Panel: No results for input(s): VITAMINB12, FOLATE, FERRITIN, TIBC, IRON, RETICCTPCT in the last 72 hours. Sepsis Labs: No results for input(s): PROCALCITON, LATICACIDVEN in the last 168 hours.  Recent Results (from the past 240 hour(s))  SARS Coronavirus 2 (Hosp order,Performed in [facility] lab via [employer])     Status: None   Collection Time: 08/25/18  9:24 AM  Result Value Ref Range Status   SARS Coronavirus 2 ([employer] Now) NEGATIVE NEGATIVE Final    Comment: (NOTE) Interpretive Result Comment(s): COVID 19 Positive SARS CoV 2 target nucleic acids are DETECTED. The SARS CoV 2 RNA is generally detectable in upper and lower respiratory specimens during the acute phase of infection.  Positive results are indicative of active infection with SARS CoV  2.  Clinical correlation with patient history and other diagnostic information is necessary to determine patient infection status.  Positive results do not rule out bacterial infection or coinfection with other viruses. The expected result is Negative. COVID 19 Negative SARS CoV 2 target nucleic acids are NOT DETECTED. The SARS CoV 2 RNA is generally detectable in upper and lower respiratory specimens during the acute phase of infection.  Negative results do not preclude SARS CoV 2 infection, do not rule out coinfections with other pathogens, and should not be used as the sole basis for treatment or other patient management decisions.  Negative results must be combined with clinical  observations, patient history, and epidemiological information. The expected result is Negative. Invalid Presence or absence of SARS CoV 2 nucleic acids cannot be determined. Repeat testing was performed on the submitted specimen and repeated Invalid results were obtained.  If clinically indicated, additional testing on a new specimen with an alternate test methodology [phone]) is advised.  The SARS CoV 2 RNA is generally detectable  in upper and lower respiratory specimens during the acute phase of infection. The expected result is Negative. Fact Sheet for Patients:  GolfingFamily.no Fact Sheet for Healthcare Providers: https://www.hernandez-brewer.com/ This test is not yet approved or cleared by the [place] FDA and has been authorized for detection and/or diagnosis of SARS CoV 2 by FDA under an Emergency Use Authorization (EUA).  This EUA will remain in effect (meaning this test can be used) for the duration of the COVID19 d eclaration under Section 564(b)(1) of the Act, 21 U.S.C. section 615 316 7452 3(b)(1), unless the authorization is terminated or revoked sooner. Performed at [facility], [address] [place], [place] [address]   SARS  Coronavirus 2 (CEPHEID - Performed in [facility] hospital lab), Hosp Order     Status: None   Collection Time: 08/25/18  1:05 PM  Result Value Ref Range Status   SARS Coronavirus 2 NEGATIVE NEGATIVE Final    Comment: (NOTE) If result is NEGATIVE SARS-CoV-2 target nucleic acids are NOT DETECTED. The SARS-CoV-2 RNA is generally detectable in upper and lower  respiratory specimens during the acute phase of infection. The lowest  concentration of SARS-CoV-2 viral copies this assay can detect is 250  copies / mL. A negative result does not preclude SARS-CoV-2 infection  and should not be used as the sole basis for treatment or other  patient management decisions.  A negative result may occur with  improper specimen collection / handling, submission of specimen other  than nasopharyngeal swab, presence of viral mutation(s) within the  areas targeted by this assay, and inadequate number of viral copies  (<250 copies / mL). A negative result must be combined with clinical  observations, patient history, and epidemiological information. If result is POSITIVE SARS-CoV-2 target nucleic acids are DETECTED. The SARS-CoV-2 RNA is generally detectable in upper and lower  respiratory specimens dur ing the acute phase of infection.  Positive  results are indicative of active infection with SARS-CoV-2.  Clinical  correlation with patient history and other diagnostic information is  necessary to determine patient infection status.  Positive results do  not rule out bacterial infection or co-infection with other viruses. If result is PRESUMPTIVE POSTIVE SARS-CoV-2 nucleic acids MAY BE PRESENT.   A presumptive positive result was obtained on the submitted specimen  and confirmed on repeat testing.  While 2019 novel coronavirus  (SARS-CoV-2) nucleic acids may be present in the submitted sample  additional confirmatory testing may be necessary for epidemiological  and / or clinical management purposes  to  differentiate between  SARS-CoV-2 and other Sarbecovirus currently known to infect humans.  If clinically indicated additional testing with an alternate test  methodology [phone]) is advised. The SARS-CoV-2 RNA is generally  detectable in upper and lower respiratory sp ecimens during the acute  phase of infection. The expected result is Negative. Fact Sheet for Patients:  StrictlyIdeas.no Fact Sheet for Healthcare Providers: BankingDealers.co.za This test is not yet approved or cleared by the [place] FDA and has been authorized for detection and/or diagnosis of SARS-CoV-2 by FDA under an Emergency Use Authorization (EUA).  This EUA will remain in effect (meaning this test can be used) for the duration of the COVID-19 declaration under Section 564(b)(1) of the Act, 21 U.S.C. section 360bbb-3(b)(1), unless the authorization is terminated or revoked sooner. Performed at [facility], [place], [place], [place] [address]          Radiology Studies: No results found.  Scheduled Meds: . famotidine  40 mg Oral QHS  . gabapentin  300 mg Oral QHS  . loratadine  10 mg Oral Daily  . pantoprazole  40 mg Oral BID  . potassium chloride  40 mEq Oral Once  . sotalol  40 mg Oral BID  . tamsulosin  0.4 mg Oral Daily  . umeclidinium bromide  1 puff Inhalation Daily   Continuous Infusions: . lactated ringers 75 mL/hr at 08/26/18 0722     LOS: 0 days    Time spent: 35 minutes     Elmarie Shiley, MD [facility] Pager [phone]  If 7PM-7AM, please contact night-coverage [email or web address] Password [email or web address] 08/26/2018, 9:10 AM

## 2018-08-26 NOTE — H&P (View-Only) (Signed)
Daily Rounding Note  08/26/2018, 2:11 PM  LOS: 0 days   SUBJECTIVE:   Chief complaint: tarry, FOBT + stools, anemia.  Severe GERD sxs.      Dr Tyrell Antonio ordered KUB this AM to assess constipation.  No BM for 3 days.  Last took [name] 3 d ago.   No signif abd pain today.  Heartburn about the same.  No problems with tolerating solids.     OBJECTIVE:         Vital signs in last 24 hours:    Temp:  [97.5 F (36.4 C)-98.7 F (37.1 C)] 97.6 F (36.4 C) (05/20 0735) Pulse Rate:  [62-70] 62 (05/20 0735) Resp:  [18-20] 18 (05/20 0735) BP: (118-134)/(80-89) 118/85 (05/20 0735) SpO2:  [98 %-100 %] 98 % (05/20 0912) Last BM Date: 08/24/18 Filed Weights   08/25/18 0813  Weight: 99.8 kg   General: NAD   Heart: RRR Chest: clear bil   Abdomen: soft, NT, ND.  Active BS  Extremities: no CCE Neuro/Psych:  Alert.  Oriented x 3.  No weakness, tremors, deficits.    Intake/Output from previous day: 05/19 0701 - 05/20 0700 In: 1176.7 [P.O.:240; I.V.:936.7] Out: 700 [Urine:700]  Intake/Output this shift: Total I/O In: -  Out: 200 [Urine:200]  Lab Results: Recent Labs    08/25/18 1533 08/25/18 2329 08/26/18 0758  WBC 8.3 8.0 8.1  HGB 9.9* 8.8* 9.8*  HCT 31.1* 27.6* 31.1*  PLT 248 267 285   BMET Recent Labs    08/25/18 0821 08/25/18 2329 08/26/18 0758  NA 140 138 139  K 2.9* 2.9* 3.4*  CL 105 107 105  CO2 25 26 24   GLUCOSE 117* 111* 120*  BUN 18 12 13   CREATININE 1.43* 1.38* 1.39*  CALCIUM 9.4 8.8* 9.1   LFT Recent Labs    08/25/18 0821  PROT 8.1  ALBUMIN 4.2  AST 30  ALT 22  ALKPHOS 72  BILITOT 0.6   PT/INR Recent Labs    08/25/18 0856  LABPROT 18.6*  INR 1.6*     EXAM: ABDOMEN - 1 VIEW  COMPARISON:  None.  FINDINGS: There is a moderate amount of stool in the ascending colon. There is no bowel dilatation to suggest obstruction. There is no evidence of pneumoperitoneum, portal  venous gas or pneumatosis.  There are no pathologic calcifications along the expected course of the ureters.  The osseous structures are unremarkable.  IMPRESSION: No acute abdominal abnormality.  Electronically Signed   By: Kathreen Devoid   On: 08/26/2018 13:11  Scheduled Meds: . famotidine  40 mg Oral QHS  . gabapentin  300 mg Oral QHS  . linaclotide  290 mcg Oral QAC breakfast  . loratadine  10 mg Oral Daily  . pantoprazole  40 mg Oral BID  . sotalol  40 mg Oral BID  . tamsulosin  0.4 mg Oral Daily  . umeclidinium bromide  1 puff Inhalation Daily   Continuous Infusions: . ferumoxytol 510 mg (08/26/18 1407)  . lactated ringers 75 mL/hr at 08/26/18 0722   PRN Meds:.acetaminophen **OR** acetaminophen, bisacodyl, ondansetron **OR** ondansetron (ZOFRAN) IV, oxyCODONE, traMADol  ASSESMENT:   *    [name], tarry, FOBT positive stools for about a week PTA Unlikely that the patient has ulcer disease given all the acid suppression meds he takes. ? stomach or intestinal AVMs causing GI bleeding.  *   Acute anemia, suspected GI blood loss is the cause.  Hgb drop  nearly 2 g in 8 days.    Sickle cell trait. Iron deficient as of studies 08/17/2018 and again on 5/20, ferritin 5.  No blood tranfusions thus far.  Hgb 9.9 >> 8.8 >> 9.8.    *    Refractory GERD symptoms despite maximum Protonix, high-dose nocturnal acid suppression.   Established diagnosis of esophageal dysmotility may be a culprit here.  *   Longstanding RUQ abdominal pain.  LFTs normal. CT  08/13/2018 showed regional hypodensity in right hepatic lobe, suggesting hepatic steatosis, normal pancreas, spleen, gallbladder, biliary tree.   *   Hypokalemia. Improved, not resolved.  Oral potassium given today.  The care every where med list includes K dur 20 meq BID.   *   Chronic constipation.  Takes Linzess daily at home.  Has not received this as inpt.   KUB completed this AM shows stool in right colon without  obstruction.    PLAN   *  Feraheme infusion now, (repeat in 1 week?)   *   Restart Linzess. PRN dulcolax PR.   Consider adding K dur 20 meq BID as per home med list.       [name]  08/26/2018, 2:11 PM Phone [phone]

## 2018-08-26 NOTE — Progress Notes (Addendum)
Daily Rounding Note  08/26/2018, 2:11 PM  LOS: 0 days   SUBJECTIVE:   Chief complaint: tarry, FOBT + stools, anemia.  Severe GERD sxs.      Dr Tyrell Antonio ordered KUB this AM to assess constipation.  No BM for 3 days.  Last took [place] 3 d ago.   No signif abd pain today.  Heartburn about the same.  No problems with tolerating solids.     OBJECTIVE:         Vital signs in last 24 hours:    Temp:  [97.5 F (36.4 C)-98.7 F (37.1 C)] 97.6 F (36.4 C) (05/20 0735) Pulse Rate:  [62-70] 62 (05/20 0735) Resp:  [18-20] 18 (05/20 0735) BP: (118-134)/(80-89) 118/85 (05/20 0735) SpO2:  [98 %-100 %] 98 % (05/20 0912) Last BM Date: 08/24/18 Filed Weights   08/25/18 0813  Weight: 99.8 kg   General: NAD   Heart: RRR Chest: clear bil   Abdomen: soft, NT, ND.  Active BS  Extremities: no CCE Neuro/Psych:  Alert.  Oriented x 3.  No weakness, tremors, deficits.    Intake/Output from previous day: 05/19 0701 - 05/20 0700 In: 1176.7 [P.O.:240; I.V.:936.7] Out: 700 [Urine:700]  Intake/Output this shift: Total I/O In: -  Out: 200 [Urine:200]  Lab Results: Recent Labs    08/25/18 1533 08/25/18 2329 08/26/18 0758  WBC 8.3 8.0 8.1  HGB 9.9* 8.8* 9.8*  HCT 31.1* 27.6* 31.1*  PLT 248 267 285   BMET Recent Labs    08/25/18 0821 08/25/18 2329 08/26/18 0758  NA 140 138 139  K 2.9* 2.9* 3.4*  CL 105 107 105  CO2 25 26 24   GLUCOSE 117* 111* 120*  BUN 18 12 13   CREATININE 1.43* 1.38* 1.39*  CALCIUM 9.4 8.8* 9.1   LFT Recent Labs    08/25/18 0821  PROT 8.1  ALBUMIN 4.2  AST 30  ALT 22  ALKPHOS 72  BILITOT 0.6   PT/INR Recent Labs    08/25/18 0856  LABPROT 18.6*  INR 1.6*     EXAM: ABDOMEN - 1 VIEW  COMPARISON:  None.  FINDINGS: There is a moderate amount of stool in the ascending colon. There is no bowel dilatation to suggest obstruction. There is no evidence of pneumoperitoneum, portal  venous gas or pneumatosis.  There are no pathologic calcifications along the expected course of the ureters.  The osseous structures are unremarkable.  IMPRESSION: No acute abdominal abnormality.  Electronically Signed   By: Kathreen Devoid   On: 08/26/2018 13:11  Scheduled Meds: . famotidine  40 mg Oral QHS  . gabapentin  300 mg Oral QHS  . linaclotide  290 mcg Oral QAC breakfast  . loratadine  10 mg Oral Daily  . pantoprazole  40 mg Oral BID  . sotalol  40 mg Oral BID  . tamsulosin  0.4 mg Oral Daily  . umeclidinium bromide  1 puff Inhalation Daily   Continuous Infusions: . ferumoxytol 510 mg (08/26/18 1407)  . lactated ringers 75 mL/hr at 08/26/18 0722   PRN Meds:.acetaminophen **OR** acetaminophen, bisacodyl, ondansetron **OR** ondansetron (ZOFRAN) IV, oxyCODONE, traMADol  ASSESMENT:   *    [name], tarry, FOBT positive stools for about a week PTA Unlikely that the patient has ulcer disease given all the acid suppression meds he takes. ? stomach or intestinal AVMs causing GI bleeding.  *   Acute anemia, suspected GI blood loss is the cause.  Hgb drop  nearly 2 g in 8 days.    Sickle cell trait. Iron deficient as of studies 08/17/2018 and again on 5/20, ferritin 5.  No blood tranfusions thus far.  Hgb 9.9 >> 8.8 >> 9.8.    *    Refractory GERD symptoms despite maximum Protonix, high-dose nocturnal acid suppression.   Established diagnosis of esophageal dysmotility may be a culprit here.  *   Longstanding RUQ abdominal pain.  LFTs normal. CT  08/13/2018 showed regional hypodensity in right hepatic lobe, suggesting hepatic steatosis, normal pancreas, spleen, gallbladder, biliary tree.   *   Hypokalemia. Improved, not resolved.  Oral potassium given today.  The care every where med list includes K dur 20 meq BID.   *   Chronic constipation.  Takes Linzess daily at home.  Has not received this as inpt.   KUB completed this AM shows stool in right colon without  obstruction.    PLAN   *  Feraheme infusion now, (repeat in 1 week?)   *   Restart Linzess. PRN dulcolax PR.   Consider adding K dur 20 meq BID as per home med list.       Azucena Freed  08/26/2018, 2:11 PM Phone [phone]

## 2018-08-27 ENCOUNTER — Encounter (HOSPITAL_COMMUNITY): Payer: Self-pay | Admitting: Certified Registered Nurse Anesthetist

## 2018-08-27 ENCOUNTER — Inpatient Hospital Stay (HOSPITAL_COMMUNITY): Payer: Medicare Other | Admitting: Anesthesiology

## 2018-08-27 ENCOUNTER — Encounter (HOSPITAL_COMMUNITY)
Admission: EM | Disposition: A | Discharge status: Home / Self Care | Payer: Self-pay | Source: Home / Self Care | Attending: Internal Medicine

## 2018-08-27 DIAGNOSIS — K921 Melena: Secondary | ICD-10-CM

## 2018-08-27 DIAGNOSIS — K222 Esophageal obstruction: Secondary | ICD-10-CM

## 2018-08-27 DIAGNOSIS — K31819 Angiodysplasia of stomach and duodenum without bleeding: Secondary | ICD-10-CM

## 2018-08-27 HISTORY — PX: ENTEROSCOPY: SHX5533

## 2018-08-27 HISTORY — PX: BIOPSY: SHX5522

## 2018-08-27 LAB — CBC
HCT: 27.9 % — ABNORMAL LOW (ref 39.0–52.0)
Hemoglobin: 8.8 g/dL — ABNORMAL LOW (ref 13.0–17.0)
MCH: 22.8 pg — ABNORMAL LOW (ref 26.0–34.0)
MCHC: 31.5 g/dL (ref 30.0–36.0)
MCV: 72.3 fL — ABNORMAL LOW (ref 80.0–100.0)
Platelets: 246 10*3/uL (ref 150–400)
RBC: 3.86 MIL/uL — ABNORMAL LOW (ref 4.22–5.81)
RDW: 15.5 % (ref 11.5–15.5)
WBC: 7.1 10*3/uL (ref 4.0–10.5)
nRBC: 0 % (ref 0.0–0.2)

## 2018-08-27 LAB — BASIC METABOLIC PANEL
Anion gap: 8 (ref 5–15)
BUN: 12 mg/dL (ref 6–20)
CO2: 23 mmol/L (ref 22–32)
Calcium: 8.7 mg/dL — ABNORMAL LOW (ref 8.9–10.3)
Chloride: 110 mmol/L (ref 98–111)
Creatinine, Ser: 1.49 mg/dL — ABNORMAL HIGH (ref 0.61–1.24)
GFR calc Af Amer: 59 mL/min — ABNORMAL LOW (ref >60)
GFR calc non Af Amer: 51 mL/min — ABNORMAL LOW (ref >60)
Glucose, Bld: 103 mg/dL — ABNORMAL HIGH (ref 70–99)
Potassium: 3.5 mmol/L (ref 3.5–5.1)
Sodium: 141 mmol/L (ref 135–145)

## 2018-08-27 LAB — MAGNESIUM: Magnesium: 2.1 mg/dL (ref 1.7–2.4)

## 2018-08-27 SURGERY — ENTEROSCOPY
Anesthesia: Monitor Anesthesia Care

## 2018-08-27 MED ORDER — EPHEDRINE SULFATE-NACL 50-0.9 MG/10ML-% IV SOSY
PREFILLED_SYRINGE | INTRAVENOUS | Status: DC | PRN
  Administered 2018-08-27: 10 mg via INTRAVENOUS

## 2018-08-27 MED ORDER — BISACODYL 5 MG PO TBEC
20.0000 mg | DELAYED_RELEASE_TABLET | Freq: Once | ORAL | Status: AC
  Administered 2018-08-27: 20 mg via ORAL
  Filled 2018-08-27: qty 4

## 2018-08-27 MED ORDER — PROPOFOL 10 MG/ML IV BOLUS
INTRAVENOUS | Status: DC | PRN
  Administered 2018-08-27: 20 mg via INTRAVENOUS

## 2018-08-27 MED ORDER — LACTATED RINGERS IV SOLN
INTRAVENOUS | Status: DC
  Administered 2018-08-27: 08:00:00 via INTRAVENOUS

## 2018-08-27 MED ORDER — PROPOFOL 500 MG/50ML IV EMUL
INTRAVENOUS | Status: DC | PRN
  Administered 2018-08-27: 150 ug/kg/min via INTRAVENOUS

## 2018-08-27 MED ORDER — PEG-KCL-NACL-NASULF-NA ASC-C 100 G PO SOLR: 1.0000 | Freq: Once | ORAL | Status: DC

## 2018-08-27 MED ORDER — METOCLOPRAMIDE HCL 5 MG/ML IJ SOLN
10.0000 mg | Freq: Once | INTRAMUSCULAR | Status: AC
  Administered 2018-08-27: 10 mg via INTRAVENOUS
  Filled 2018-08-27: qty 2

## 2018-08-27 MED ORDER — LIDOCAINE 2% (20 MG/ML) 5 ML SYRINGE
INTRAMUSCULAR | Status: DC | PRN
  Administered 2018-08-27: 60 mg via INTRAVENOUS

## 2018-08-27 MED ORDER — PEG-KCL-NACL-NASULF-NA ASC-C 100 G PO SOLR
0.5000 | Freq: Once | ORAL | Status: AC
  Administered 2018-08-27: 100 g via ORAL
  Filled 2018-08-27: qty 1

## 2018-08-27 MED ORDER — PEG-KCL-NACL-NASULF-NA ASC-C 100 G PO SOLR
0.5000 | Freq: Once | ORAL | Status: AC
  Administered 2018-08-27: 100 g via ORAL
  Filled 2018-08-27 (×2): qty 1

## 2018-08-27 NOTE — Plan of Care (Signed)
Patient admitted for GI bleed, NPO for Upper EGD Problem: Activity: Goal: Risk for activity intolerance will decrease Outcome: Progressing   Problem: Nutrition: Goal: Adequate nutrition will be maintained Outcome: Progressing   Problem: Safety: Goal: Ability to remain free from injury will improve Outcome: Progressing

## 2018-08-27 NOTE — Op Note (Signed)
[facility] Patient Name: [name] Procedure Date : 08/27/2018 MRN: [id number] Attending MD: Thornton Park MD, MD Date of Birth: [birth year] CSN: [id number] Age: 58 Admit Type: Outpatient Procedure:                Small bowel enteroscopy Indications:              Melena, Obscure gastrointestinal bleeding, GERD Providers:                Thornton Park MD, MD, Raynelle Bring, RN, Elspeth Cho Tech., Technician, Ladona Ridgel,                            Technician Referring MD:              Medicines:                See the Anesthesia note for documentation of the                            administered medications Complications:            No immediate complications. Estimated blood loss:                            Minimal. Estimated Blood Loss:     Estimated blood loss was minimal. Procedure:                Pre-Anesthesia Assessment:                           - Prior to the procedure, a History and Physical                            was performed, and patient medications and                            allergies were reviewed. The patient's tolerance of                            previous anesthesia was also reviewed. The risks                            and benefits of the procedure and the sedation                            options and risks were discussed with the patient.                            All questions were answered, and informed consent                            was obtained. Prior Anticoagulants: The patient has                            taken Eliquis (  apixaban), last dose was 3 days                            prior to procedure. ASA Grade Assessment: III - A                            patient with severe systemic disease. After                            reviewing the risks and benefits, the patient was                            deemed in satisfactory condition to undergo the                             procedure.                           After obtaining informed consent, the endoscope was                            passed under direct vision. Throughout the                            procedure, the patient's blood pressure, pulse, and                            oxygen saturations were monitored continuously. The                            [id number]-[id number] ([id number] Olympus pediatric colonscope                            was introduced through the mouth and advanced to                            150 cm from the incisors. The small bowel                            enteroscopy was accomplished without difficulty.                            The patient tolerated the procedure well. Scope In: 8:33:58 AM Scope Out: 8:45:14 AM Total Procedure Duration: 0 hours 11 minutes 16 seconds  Findings:      A widely patent Schatzki ring was found at the gastroesophageal       junction. No esophagitis present.      The entire examined stomach was normal except for a small hiatal hernia.       No Cameron's erosions present. No blood seen.      Three lymphangectasias with no bleeding were found in the duodenal bulb       and second portion of the duodenum. Biopsies were taken with a cold       forceps for histology. Estimated blood loss was minimal.  There was no evidence of significant pathology in the entire examined       duodenum. Biopsies for histology were taken with a cold forceps for       evaluation of celiac disease. No blood seen. Impression:               - Widely patent Schatzki ring.                           - Small hiatal hernia. No Cameron's erosions                            present.                           - No esophagitis. Reflux is likely non-erosive                            esophagitis or related to known esophageal motility                            disorder.                           - Normal stomach. No blood seen.                           - Three non-bleeding  lymphangectasias in the                            duodenum. Biopsied. No blood seen in the duodenum.                           - Normal examined duodenum. Biopsied.                           - Source of anemia not identified on this                            examination. Recommendation:           - Clear liquid diet today.                           - Await pathology results.                           - Continue reflux medications. Follow-up with Dr.                            Marin Comment as an outpatient given his prior diagnosis of                            esophageal dysmotility.                           - Outpatient follow-up with Dr. Mitchell Heir in [place] versus proceeding with colonoscopy discussed                            with the patient. He is anxious about the source of                            bleeding and would prefer to have a colonoscopy                            now. Will schedule for 08/28/18. Bowel prep tonight                            with NPO at midnight in preparation for the                            procedure tomorrow.                           - Continue to hold Eliquis at this time. Procedure Code(s):        --- Professional ---                           [phone], Small intestinal endoscopy, enteroscopy                            beyond second portion of duodenum, not including                            ileum; with biopsy, single or multiple Diagnosis Code(s):        --- Professional ---                           K22.2, Esophageal obstruction                           K31.819, Angiodysplasia of stomach and duodenum                            without bleeding                           K92.1, Melena (includes Hematochezia)                           K92.2, Gastrointestinal hemorrhage, unspecified CPT copyright 2019 American Medical Association. All rights reserved. The codes documented in this report are preliminary and upon coder review may   be revised to meet current compliance requirements. Thornton Park MD, MD 08/27/2018 9:15:48 AM This report has been signed electronically. Number of Addenda: 0

## 2018-08-27 NOTE — Interval H&P Note (Signed)
History and Physical Interval Note:  08/27/2018 8:14 AM  [name]  has presented today for surgery, with the diagnosis of anemia.  tarry, FOBT + stools.  refractory GERD sxs..  The various methods of treatment have been discussed with the patient and family. After consideration of risks, benefits and other options for treatment, the patient has consented to  Procedure(s): ENTEROSCOPY (N/A) as a surgical intervention.  The patient's history has been reviewed, patient examined, no change in status, stable for surgery.  I have reviewed the patient's chart and labs.  Questions were answered to the patient's satisfaction.     Thornton Park

## 2018-08-27 NOTE — Progress Notes (Signed)
Spoke with [employer] regarding patients son who is deployed in the [employer]. Contact for [employer] to reach son for critical results [phone].   Case number [id number]

## 2018-08-27 NOTE — Transfer of Care (Signed)
Immediate Anesthesia Transfer of Care Note  Patient: [name]  Procedure(s) Performed: ENTEROSCOPY (N/A ) BIOPSY  Patient Location: Endoscopy Unit  Anesthesia Type:MAC  Level of Consciousness: awake, alert  and oriented  Airway & Oxygen Therapy: Patient Spontanous Breathing and Patient connected to nasal cannula oxygen  Post-op Assessment: Report given to RN and Post -op Vital signs reviewed and stable  Post vital signs: Reviewed and stable  Last Vitals:  Vitals Value Taken Time  BP 94/52 08/27/2018  8:55 AM  Temp    Pulse 79 08/27/2018  8:55 AM  Resp 21 08/27/2018  8:55 AM  SpO2 98 % 08/27/2018  8:55 AM  Vitals shown include unvalidated device data.  Last Pain:  Vitals:   08/27/18 0811  TempSrc: Oral  PainSc: 0-No pain         Complications: No apparent anesthesia complications

## 2018-08-27 NOTE — Anesthesia Preprocedure Evaluation (Signed)
Anesthesia Evaluation  Patient identified by MRN, date of birth, ID band Patient awake    Reviewed: Allergy & Precautions, NPO status , Patient's Chart, lab work & pertinent test results  Airway Mallampati: II  TM Distance: >3 FB Neck ROM: Full    Dental  (+) Teeth Intact, Dental Advisory Given   Pulmonary    breath sounds clear to auscultation       Cardiovascular hypertension,  Rhythm:Regular Rate:Normal     Neuro/Psych    GI/Hepatic   Endo/Other    Renal/GU      Musculoskeletal   Abdominal   Peds  Hematology   Anesthesia Other Findings   Reproductive/Obstetrics                             Anesthesia Physical Anesthesia Plan  ASA: III  Anesthesia Plan: MAC   Post-op Pain Management:    Induction: Intravenous  PONV Risk Score and Plan: Ondansetron and Dexamethasone  Airway Management Planned: Simple Face Mask and Natural Airway  Additional Equipment:   Intra-op Plan:   Post-operative Plan:   Informed Consent: I have reviewed the patients History and Physical, chart, labs and discussed the procedure including the risks, benefits and alternatives for the proposed anesthesia with the patient or authorized representative who has indicated his/her understanding and acceptance.     Dental advisory given  Plan Discussed with: CRNA and Anesthesiologist  Anesthesia Plan Comments:         Anesthesia Quick Evaluation

## 2018-08-27 NOTE — Progress Notes (Signed)
PROGRESS NOTE    [name]  ZOX:[id number] DOB: 04/30/1960 DOA: 08/25/2018 PCP: [name], MD   Brief Narrative: 58 year old with past medical history significant for COPD, hypertension, BPH, A. fib on Xarelto who presents with fatigue, shortness of breath and tarry stool.  And has been having trouble with constipation. He  saw his gastroenterologist was prescribed bowel prep.  He also was evaluated by his urologist for hematuria with negative work-up.   Patient admitted with acute blood loss anemia, shortness of breath.  He is on chronic anticoagulation for A. fib.  Xarelto has been on hold.  He underwent a small bowel enteroscopy on 5/21.  No source of bleeding was found.  Biopsy was taken results pending at this time.  Plan is for colonoscopy on 5/22.     Assessment & Plan:   Principal Problem:   Gastrointestinal hemorrhage Active Problems:   HTN (hypertension)   CKD (chronic kidney disease) stage 3, GFR 30-59 ml/min (HCC)   PAF (paroxysmal atrial fibrillation) (HCC)  1-Upper GI bleed: Patient present with anemia, shortness of breath, tarry stool. Was advised to stop using NSAIDs. With PPI twice daily. Plan for endoscopy on 5/20 first after Xarelto washout. Need to monitor hemoglobin level. Occult blood positive Patient underwent a small bowel enteroscopy on 5/20 first.  No source of bleeding was identified.  3 non bleeding lymphangiectasias   in the duodenum were  Biopsied. Plan for colonoscopy on 5/20-second  2-Anemia, iron deficiency probably in the setting of GI bleed. Iron level low.  Received  IV iron on 5-20. B12 low normal, received intramuscular injection of B12.  3-Shortness of breath: Could be related to anemia.   Chest x ray ; no active cardiopulmonary disease.  4-Constipation: KUB negative for obstruction. Showed moderate amount of stool in the ascending colon. Bowel prep today for colonoscopy tomorrow.  Continue with Linzess.   A. fib on  Xarelto: Rate controlled on sotalol continue. Patient will require GI evaluation prior to resumption of Xarelto.  Hypertension: Continue with sotalol.  will hold Norvasc Chronic kidney disease stage II stable  Estimated body mass index is 29.84 kg/m as calculated from the following:   Height as of this encounter: 6' (1.829 m).   Weight as of this encounter: 99.8 kg.   DVT prophylaxis: SCDs no anticoagulation due to concern for GI bleed Code Status: Full code Family Communication: Care discussed with patient Disposition Plan: Patient need to remain in the hospital for further monitoring of hemoglobin and monitor for GI bleed.  He will require colonoscopy  study prior to discharge to be able to determine safest time for anticoagulation resumption   Consultants:  GI  Procedures: None  Antimicrobials: None  Subjective: Patient seen prior to endoscopy today.  He denies chest pain shortness of breath.  Still short of breath on exertion.  He had a bowel movement yesterday  Objective: Vitals:   08/27/18 0811 08/27/18 0853 08/27/18 0900 08/27/18 1023  BP: (!) 143/92 (!) 94/52 116/64   Pulse: 62 81 79 74  Resp: 20 (!) _0 Temp: 98 F (36.7 C) 98.3 F (36.8 C)    TempSrc: Oral     SpO2: 99% 97% 94% 97%  Weight: 99.8 kg     Height: 6' (1.829 m)       Intake/Output Summary (Last 24 hours) at 08/27/2018 1205 Last data filed at 08/27/2018 0846 Gross per 24 hour  Intake 795 ml  Output -  Net 795 ml  Filed Weights   08/25/18 0813 08/27/18 0811  Weight: 99.8 kg 99.8 kg    Examination:  General exam: No acute distress Respiratory system: Clear to auscultation Cardiovascular system: S1, S2 regular normal rate Gastrointestinal system: Bowel sounds present soft, nontender not distended Central nervous system: Alert and oriented. No focal neurological deficits. Extremities: No edema Skin: No rashes.   Data Reviewed: I have personally reviewed following labs and imaging  studies  CBC: Recent Labs  Lab 08/25/18 0821 08/25/18 1533 08/25/18 2329 08/26/18 0758 08/27/18 0415  WBC 7.0 8.3 8.0 8.1 7.1  NEUTROABS 3.6  --   --   --   --   HGB 9.9* 9.9* 8.8* 9.8* 8.8*  HCT 32.5* 31.1* 27.6* 31.1* 27.9*  MCV 73.4* 71.8* 71.3* 71.5* 72.3*  PLT 277 248 267 285 710   Basic Metabolic Panel: Recent Labs  Lab 08/25/18 0821 08/25/18 2329 08/26/18 0758 08/27/18 0415  NA 140 138 139 141  K 2.9* 2.9* 3.4* 3.5  CL 105 107 105 110  CO2 _0 GLUCOSE 117* 111* 120* 103*  BUN _1 CREATININE 1.43* 1.38* 1.39* 1.49*  CALCIUM 9.4 8.8* 9.1 8.7*  MG  --   --   --  2.1   GFR: Estimated Creatinine Clearance: 65.3 mL/min (A) (by C-G formula based on SCr of 1.49 mg/dL (H)). Liver Function Tests: Recent Labs  Lab 08/25/18 0821  AST 30  ALT 22  ALKPHOS 72  BILITOT 0.6  PROT 8.1  ALBUMIN 4.2   No results for input(s): LIPASE, AMYLASE in the last 168 hours. No results for input(s): AMMONIA in the last 168 hours. Coagulation Profile: Recent Labs  Lab 08/25/18 0856  INR 1.6*   Cardiac Enzymes: No results for input(s): CKTOTAL, CKMB, CKMBINDEX, TROPONINI in the last 168 hours. BNP (last 3 results) No results for input(s): PROBNP in the last 8760 hours. HbA1C: No results for input(s): HGBA1C in the last 72 hours. CBG: No results for input(s): GLUCAP in the last 168 hours. Lipid Profile: No results for input(s): CHOL, HDL, LDLCALC, TRIG, CHOLHDL, LDLDIRECT in the last 72 hours. Thyroid Function Tests: No results for input(s): TSH, T4TOTAL, FREET4, T3FREE, THYROIDAB in the last 72 hours. Anemia Panel: Recent Labs    08/26/18 0945  VITAMINB12 236  FOLATE 11.2  FERRITIN 5*  TIBC 413  IRON 18*  RETICCTPCT 1.5   Sepsis Labs: No results for input(s): PROCALCITON, LATICACIDVEN in the last 168 hours.  Recent Results (from the past 240 hour(s))  SARS Coronavirus 2 (Hosp order,Performed in [facility] lab via [employer] ID)     Status: None    Collection Time: 08/25/18  9:24 AM  Result Value Ref Range Status   SARS Coronavirus 2 ([employer] Now) NEGATIVE NEGATIVE Final    Comment: (NOTE) Interpretive Result Comment(s): COVID 19 Positive SARS CoV 2 target nucleic acids are DETECTED. The SARS CoV 2 RNA is generally detectable in upper and lower respiratory specimens during the acute phase of infection.  Positive results are indicative of active infection with SARS CoV 2.  Clinical correlation with patient history and other diagnostic information is necessary to determine patient infection status.  Positive results do not rule out bacterial infection or coinfection with other viruses. The expected result is Negative. COVID 19 Negative SARS CoV 2 target nucleic acids are NOT DETECTED. The SARS CoV 2 RNA is generally detectable in upper and lower respiratory specimens during the acute phase of infection.  Negative results do not preclude SARS CoV 2 infection, do not rule out coinfections with other pathogens, and should not be used as the sole basis for treatment or other patient management decisions.  Negative results must be combined with clinical  observations, patient history, and epidemiological information. The expected result is Negative. Invalid Presence or absence of SARS CoV 2 nucleic acids cannot be determined. Repeat testing was performed on the submitted specimen and repeated Invalid results were obtained.  If clinically indicated, additional testing on a new specimen with an alternate test methodology [phone]) is advised.  The SARS CoV 2 RNA is generally detectable in upper and lower respiratory specimens during the acute phase of infection. The expected result is Negative. Fact Sheet for Patients:  GolfingFamily.no Fact Sheet for Healthcare Providers: https://www.hernandez-brewer.com/ This test is not yet approved or cleared by the [place] FDA and has been  authorized for detection and/or diagnosis of SARS CoV 2 by FDA under an Emergency Use Authorization (EUA).  This EUA will remain in effect (meaning this test can be used) for the duration of the COVID19 d eclaration under Section 564(b)(1) of the Act, 21 U.S.C. section [phone] 3(b)(1), unless the authorization is terminated or revoked sooner. Performed at [facility], [address] [place], [place] [address]   SARS Coronavirus 2 (CEPHEID - Performed in [facility] hospital lab), Hosp Order     Status: None   Collection Time: 08/25/18  1:05 PM  Result Value Ref Range Status   SARS Coronavirus 2 NEGATIVE NEGATIVE Final    Comment: (NOTE) If result is NEGATIVE SARS-CoV-2 target nucleic acids are NOT DETECTED. The SARS-CoV-2 RNA is generally detectable in upper and lower  respiratory specimens during the acute phase of infection. The lowest  concentration of SARS-CoV-2 viral copies this assay can detect is 250  copies / mL. A negative result does not preclude SARS-CoV-2 infection  and should not be used as the sole basis for treatment or other  patient management decisions.  A negative result may occur with  improper specimen collection / handling, submission of specimen other  than nasopharyngeal swab, presence of viral mutation(s) within the  areas targeted by this assay, and inadequate number of viral copies  (<250 copies / mL). A negative result must be combined with clinical  observations, patient history, and epidemiological information. If result is POSITIVE SARS-CoV-2 target nucleic acids are DETECTED. The SARS-CoV-2 RNA is generally detectable in upper and lower  respiratory specimens dur ing the acute phase of infection.  Positive  results are indicative of active infection with SARS-CoV-2.  Clinical  correlation with patient history and other diagnostic information is  necessary to determine patient infection status.  Positive results do  not rule out bacterial  infection or co-infection with other viruses. If result is PRESUMPTIVE POSTIVE SARS-CoV-2 nucleic acids MAY BE PRESENT.   A presumptive positive result was obtained on the submitted specimen  and confirmed on repeat testing.  While 2019 novel coronavirus  (SARS-CoV-2) nucleic acids may be present in the submitted sample  additional confirmatory testing may be necessary for epidemiological  and / or clinical management purposes  to differentiate between  SARS-CoV-2 and other Sarbecovirus currently known to infect humans.  If clinically indicated additional testing with an alternate test  methodology [phone]) is advised. The SARS-CoV-2 RNA is generally  detectable in upper and lower respiratory sp ecimens during the acute  phase of infection. The expected result is Negative. Fact Sheet for  Patients:  StrictlyIdeas.no Fact Sheet for Healthcare Providers: BankingDealers.co.za This test is not yet approved or cleared by the [place] FDA and has been authorized for detection and/or diagnosis of SARS-CoV-2 by FDA under an Emergency Use Authorization (EUA).  This EUA will remain in effect (meaning this test can be used) for the duration of the COVID-19 declaration under Section 564(b)(1) of the Act, 21 U.S.C. section 360bbb-3(b)(1), unless the authorization is terminated or revoked sooner. Performed at [facility], [place] [address], [place], [place] [address]          Radiology Studies: Dg Chest 2 View  Result Date: 08/26/2018 CLINICAL DATA:  Short of breath EXAM: CHEST - 2 VIEW COMPARISON:  12/03/2017 FINDINGS: The heart size and mediastinal contours are within normal limits. Both lungs are clear. The visualized skeletal structures are unremarkable. IMPRESSION: No active cardiopulmonary disease. Electronically Signed   By: Franchot Gallo M.D.   On: 08/26/2018 13:10   Dg Abd 1 View  Result Date: 08/26/2018 CLINICAL DATA:   Constipation few days, shortness of EXAM: ABDOMEN - 1 VIEW COMPARISON:  None. FINDINGS: There is a moderate amount of stool in the ascending colon. There is no bowel dilatation to suggest obstruction. There is no evidence of pneumoperitoneum, portal venous gas or pneumatosis. There are no pathologic calcifications along the expected course of the ureters. The osseous structures are unremarkable. IMPRESSION: No acute abdominal abnormality. Electronically Signed   By: Kathreen Devoid   On: 08/26/2018 13:11        Scheduled Meds: . famotidine  40 mg Oral QHS  . gabapentin  300 mg Oral QHS  . linaclotide  290 mcg Oral QAC breakfast  . loratadine  10 mg Oral Daily  . metoCLOPramide (REGLAN) injection  10 mg Intravenous Once   Followed by  . metoCLOPramide (REGLAN) injection  10 mg Intravenous Once  . pantoprazole  40 mg Oral BID  . peg 3350 powder  0.5 kit Oral Once   And  . [START ON 08/28/2018] peg 3350 powder  0.5 kit Oral Once  . polyethylene glycol  17 g Oral BID  . sotalol  40 mg Oral BID  . tamsulosin  0.4 mg Oral Daily  . umeclidinium bromide  1 puff Inhalation Daily   Continuous Infusions: . lactated ringers 75 mL/hr at 08/26/18 2152     LOS: 1 day    Time spent: 35 minutes     Elmarie Shiley, MD [facility] Pager [phone]  If 7PM-7AM, please contact night-coverage [email or web address] Password [facility] 08/27/2018, 12:05 PM

## 2018-08-27 NOTE — Anesthesia Postprocedure Evaluation (Signed)
Anesthesia Post Note  Patient: KIVON APREA  Procedure(s) Performed: ENTEROSCOPY (N/A ) BIOPSY     Patient location during evaluation: Endoscopy Anesthesia Type: MAC Level of consciousness: awake and alert Pain management: pain level controlled Vital Signs Assessment: post-procedure vital signs reviewed and stable Respiratory status: spontaneous breathing, nonlabored ventilation, respiratory function stable and patient connected to nasal cannula oxygen Cardiovascular status: blood pressure returned to baseline and stable Postop Assessment: no apparent nausea or vomiting Anesthetic complications: no    Last Vitals:  Vitals:   08/27/18 1023 08/27/18 1635  BP:  125/83  Pulse: 74 61  Resp: 16 18  Temp:  36.8 C  SpO2: 97% 99%    Last Pain:  Vitals:   08/27/18 1635  TempSrc: Oral  PainSc:                  Rosalea Withrow COKER

## 2018-08-28 ENCOUNTER — Encounter (HOSPITAL_COMMUNITY)
Admission: EM | Disposition: A | Discharge status: Home / Self Care | Payer: Self-pay | Source: Home / Self Care | Attending: Internal Medicine

## 2018-08-28 ENCOUNTER — Inpatient Hospital Stay (HOSPITAL_COMMUNITY): Payer: Medicare Other | Admitting: Certified Registered Nurse Anesthetist

## 2018-08-28 ENCOUNTER — Encounter (HOSPITAL_COMMUNITY): Payer: Self-pay | Admitting: Gastroenterology

## 2018-08-28 DIAGNOSIS — I480 Paroxysmal atrial fibrillation: Secondary | ICD-10-CM

## 2018-08-28 DIAGNOSIS — K648 Other hemorrhoids: Secondary | ICD-10-CM

## 2018-08-28 DIAGNOSIS — I10 Essential (primary) hypertension: Secondary | ICD-10-CM

## 2018-08-28 DIAGNOSIS — D123 Benign neoplasm of transverse colon: Secondary | ICD-10-CM

## 2018-08-28 DIAGNOSIS — K5730 Diverticulosis of large intestine without perforation or abscess without bleeding: Secondary | ICD-10-CM

## 2018-08-28 DIAGNOSIS — D62 Acute posthemorrhagic anemia: Secondary | ICD-10-CM

## 2018-08-28 DIAGNOSIS — K635 Polyp of colon: Secondary | ICD-10-CM

## 2018-08-28 HISTORY — PX: COLONOSCOPY WITH PROPOFOL: SHX5780

## 2018-08-28 HISTORY — PX: GIVENS CAPSULE STUDY: SHX5432

## 2018-08-28 HISTORY — PX: POLYPECTOMY: SHX5525

## 2018-08-28 HISTORY — PX: BIOPSY: SHX5522

## 2018-08-28 LAB — CBC
HCT: 26.4 % — ABNORMAL LOW (ref 39.0–52.0)
Hemoglobin: 8.3 g/dL — ABNORMAL LOW (ref 13.0–17.0)
MCH: 22.4 pg — ABNORMAL LOW (ref 26.0–34.0)
MCHC: 31.4 g/dL (ref 30.0–36.0)
MCV: 71.4 fL — ABNORMAL LOW (ref 80.0–100.0)
Platelets: 250 10*3/uL (ref 150–400)
RBC: 3.7 MIL/uL — ABNORMAL LOW (ref 4.22–5.81)
RDW: 15.4 % (ref 11.5–15.5)
WBC: 8 10*3/uL (ref 4.0–10.5)
nRBC: 0 % (ref 0.0–0.2)

## 2018-08-28 LAB — BASIC METABOLIC PANEL
Anion gap: 11 (ref 5–15)
BUN: 8 mg/dL (ref 6–20)
CO2: 23 mmol/L (ref 22–32)
Calcium: 8.8 mg/dL — ABNORMAL LOW (ref 8.9–10.3)
Chloride: 108 mmol/L (ref 98–111)
Creatinine, Ser: 1.47 mg/dL — ABNORMAL HIGH (ref 0.61–1.24)
GFR calc Af Amer: 60 mL/min — ABNORMAL LOW (ref >60)
GFR calc non Af Amer: 51 mL/min — ABNORMAL LOW (ref >60)
Glucose, Bld: 88 mg/dL (ref 70–99)
Potassium: 3.3 mmol/L — ABNORMAL LOW (ref 3.5–5.1)
Sodium: 142 mmol/L (ref 135–145)

## 2018-08-28 SURGERY — COLONOSCOPY WITH PROPOFOL
Anesthesia: Monitor Anesthesia Care

## 2018-08-28 SURGERY — IMAGING PROCEDURE, GI TRACT, INTRALUMINAL, VIA CAPSULE

## 2018-08-28 MED ORDER — BISACODYL 5 MG PO TBEC: 20.0000 mg | DELAYED_RELEASE_TABLET | Freq: Once | ORAL | Status: DC

## 2018-08-28 MED ORDER — PROPOFOL 10 MG/ML IV BOLUS
INTRAVENOUS | Status: DC | PRN
  Administered 2018-08-28: 50 mg via INTRAVENOUS
  Administered 2018-08-28: 20 mg via INTRAVENOUS

## 2018-08-28 MED ORDER — POTASSIUM CHLORIDE CRYS ER 20 MEQ PO TBCR
40.0000 meq | EXTENDED_RELEASE_TABLET | Freq: Once | ORAL | Status: AC
  Administered 2018-08-28: 40 meq via ORAL
  Filled 2018-08-28 (×2): qty 2

## 2018-08-28 MED ORDER — POTASSIUM CHLORIDE 10 MEQ/100ML IV SOLN
10.0000 meq | INTRAVENOUS | Status: DC
  Administered 2018-08-28 (×2): 10 meq via INTRAVENOUS
  Filled 2018-08-28 (×2): qty 100

## 2018-08-28 MED ORDER — LACTATED RINGERS IV SOLN
INTRAVENOUS | Status: DC
  Administered 2018-08-28: 11:00:00 via INTRAVENOUS

## 2018-08-28 MED ORDER — PROPOFOL 500 MG/50ML IV EMUL
INTRAVENOUS | Status: DC | PRN
  Administered 2018-08-28: 150 ug/kg/min via INTRAVENOUS

## 2018-08-28 SURGICAL SUPPLY — 21 items

## 2018-08-28 NOTE — Progress Notes (Addendum)
PROGRESS NOTE   [name]  VZD:[id number]    DOB: 1960/12/20    DOA: 08/25/2018  PCP: [name], MD   I have briefly reviewed patients previous medical records in [facility].  Brief Narrative:  58 year old male, PMH of COPD, HTN, BPH, A. fib on Xarelto, OSA, GERD, sickle cell trait, recent hematuria 3 weeks ago evaluated by urology as outpatient, presented to ED with 1 to 2-week history of black tarry stools, intermittent lower abdominal discomfort, progressive fatigue and tiredness especially with exertion.  He was admitted for symptomatic anemia in the context of suspected GI bleeding.  Hemoglobin has slowly drifted down but has not required PRBCs yet.  [facility] consulted, status post small bowel enteroscopy 5/21 without bleeding source and colonoscopy 5/22 which also did not reveal bleeding source and hence patient will undergo capsule endoscopy 5/23.  Assessment & Plan:   Principal Problem:   Gastrointestinal hemorrhage Active Problems:   HTN (hypertension)   CKD (chronic kidney disease) stage 3, GFR 30-59 ml/min (HCC)   PAF (paroxysmal atrial fibrillation) (HCC)   Adenomatous polyp of transverse colon   1. GI bleeding: Patient presented with black tarry stools of 1 to 2 weeks duration.  Patient on Xarelto for A. fib PTA.  Some NSAID intake history.  [facility] consulted.  Xarelto held.  Patient advised to stop NSAID use.  Placed on IV PPI, transitioned to oral post EGD.  FOBT +. Status post small bowel enteroscopy 5/21 without bleeding source and colonoscopy 5/22 which also did not reveal bleeding source.  Ongoing black tarry stools although this may be residual blood, slowly drifting down hemoglobin/8.3, need to be sure to rule out bleeding source prior to considering resuming Xarelto, hence GI plans capsule endoscopy 5/22.  I discussed with Dr. Tarri Glenn.  GI bleeding source not clear yet. 2. Iron deficiency anemia/acute blood loss anemia, symptomatic: Approximately  2 g hemoglobin drop on admission but since then hemoglobin has gradually drifted down from 9.9-8.3.  Status post IV iron 5/20, oral supplements as outpatient after acute GI issues have resolved.  Received a dose of IM B12 for low normal B12 levels.  Follow CBC in a.m. and transfuse if hemoglobin 7 g or less. 3. Chronic constipation: KUB negative for obstruction but did show moderate amount of stool in the ascending colon.  Underwent bowel prep for colonoscopy.  Continue Linzess.   4. GERD/esophageal dysmotility: Continue PPI. 5. PAF: Continue sotalol.  Clinically in sinus rhythm.  Xarelto on hold until GI bleeding issues are fully evaluated and resolved. 6. Essential hypertension: Mostly controlled.  Amlodipine and Dyazide held.  Continue sotalol. 7. Stage II chronic kidney disease: Stable.  Periodically follow BMP. 8. Hypokalemia: Replaced IV this morning.  Magnesium normal.  Follow BMP in a.m. 9. Overweight/Body mass index is 29.84 kg/m. 10. Chronic pain: Counseled regarding not using NSAIDs and he verbalized understanding.     DVT prophylaxis: SCDs Code Status: Full Family Communication: None at bedside Disposition: DC home pending further evaluation and clinical stability.   Consultants:  [facility]  Procedures:   Small bowel enteroscopy 08/27/2018:  Impression: - Widely patent Schatzki ring. - Small hiatal hernia. No Cameron's erosions present. - No esophagitis. Reflux is likely non-erosive esophagitis or related to known esophageal motility disorder. - Normal stomach. No blood seen. - Three non-bleeding lymphangectasias in the duodenum. Biopsied. No blood seen in the duodenum. - Normal examined duodenum. Biopsied. - Source of anemia not identified on this examination.  Recommendation: - Clear liquid diet today. - Await pathology results. - Continue reflux medications. Follow-up with Dr. Marin Comment as an outpatient given his prior diagnosis of esophageal dysmotility. -  Outpatient follow-up with Dr. Mitchell Heir in [facility] versus proceeding with colonoscopy discussed with the patient. He is anxious about the source of bleeding and would prefer to have a colonoscopy now. Will schedule for 08/28/18. Bowel prep tonight with NPO at midnight in preparation for the procedure tomorrow. - Continue to hold Eliquis at this time.   Colonoscopy 08/28/2018:  Impression: - Preparation of the colon was poor. - One 2 mm polyp in the transverse colon, removed with a cold snare. Resected and retrieved. - Diverticulosis in the sigmoid colon and in the descending colon. - Erythematous mucosa in the distal sigmoid colon. Biopsied. - Non-bleeding internal hemorrhoids. - Source of anemia was not identified on this examination. - The examination was otherwise normal on direct and retroflexion views.  Recommendation: - Clear liquid diet today. High fiber diet in the future. - Continue present medications. - Await pathology results. - Repeat colonoscopy in 1-2 years because the bowel preparation was poor. - To visualize the small bowel and complete his evaluation for source for GI blood loss anemia, perform video capsule endoscopy tomorrow.  Antimicrobials:  None   Subjective: Interviewed and examined the patient this morning prior to procedure.  RN at bedside.  States that he had 3-4 liquid BMs overnight after bowel prep and they were still dark.  No red blood.  Ongoing intermittent discomfort across lower abdomen for the last 1 to 2 weeks with associated nausea but no vomiting.  States that he has not gotten much out of bed since admission  ROS: As above.  Objective:  Vitals:   08/28/18 1113 08/28/18 1200 08/28/18 1205 08/28/18 1210  BP: (!) 154/92 102/72 105/69 118/81  Pulse: 65 73 74 66  Resp: 17 18 16 19   Temp: 98.6 F (37 C) 98 F (36.7 C)    TempSrc: Oral Oral    SpO2: 99% 99% 98% 96%  Weight:      Height:        Examination:  General exam: Pleasant  young male, moderately built and overweight lying comfortably propped up in bed without distress.  Oral mucosa moist. Respiratory system: Clear to auscultation. Respiratory effort normal. Cardiovascular system: S1 & S2 heard, RRR. No JVD, murmurs, rubs, gallops or clicks. No pedal edema. Gastrointestinal system: Abdomen is nondistended, soft and nontender. No organomegaly or masses felt. Normal bowel sounds heard. Central nervous system: Alert and oriented. No focal neurological deficits. Extremities: Symmetric 5 x 5 power. Skin: No rashes, lesions or ulcers Psychiatry: Judgement and insight appear normal. Mood & affect appropriate.     Data Reviewed: I have personally reviewed following labs and imaging studies  CBC: Recent Labs  Lab 08/25/18 0821 08/25/18 1533 08/25/18 2329 08/26/18 0758 08/27/18 0415 08/28/18 0511  WBC 7.0 8.3 8.0 8.1 7.1 8.0  NEUTROABS 3.6  --   --   --   --   --   HGB 9.9* 9.9* 8.8* 9.8* 8.8* 8.3*  HCT 32.5* 31.1* 27.6* 31.1* 27.9* 26.4*  MCV 73.4* 71.8* 71.3* 71.5* 72.3* 71.4*  PLT 277 248 267 285 246 518   Basic Metabolic Panel: Recent Labs  Lab 08/25/18 0821 08/25/18 2329 08/26/18 0758 08/27/18 0415 08/28/18 0511  NA 140 138 139 141 142  K 2.9* 2.9* 3.4* 3.5 3.3*  CL 105 107 105 110 108  CO2  25 26 24 23 23   GLUCOSE 117* 111* 120* 103* 88  BUN 18 12 13 12 8   CREATININE 1.43* 1.38* 1.39* 1.49* 1.47*  CALCIUM 9.4 8.8* 9.1 8.7* 8.8*  MG  --   --   --  2.1  --    Liver Function Tests: Recent Labs  Lab 08/25/18 0821  AST 30  ALT 22  ALKPHOS 72  BILITOT 0.6  PROT 8.1  ALBUMIN 4.2   Coagulation Profile: Recent Labs  Lab 08/25/18 0856  INR 1.6*     Recent Results (from the past 240 hour(s))  SARS Coronavirus 2 (Hosp order,Performed in [place] lab via [employer] [facility])     Status: None   Collection Time: 08/25/18  9:24 AM  Result Value Ref Range Status   SARS Coronavirus 2 ([employer] Now) NEGATIVE NEGATIVE Final    Comment:  (NOTE) Interpretive Result Comment(s): COVID 19 Positive SARS CoV 2 target nucleic acids are DETECTED. The SARS CoV 2 RNA is generally detectable in upper and lower respiratory specimens during the acute phase of infection.  Positive results are indicative of active infection with SARS CoV 2.  Clinical correlation with patient history and other diagnostic information is necessary to determine patient infection status.  Positive results do not rule out bacterial infection or coinfection with other viruses. The expected result is Negative. COVID 19 Negative SARS CoV 2 target nucleic acids are NOT DETECTED. The SARS CoV 2 RNA is generally detectable in upper and lower respiratory specimens during the acute phase of infection.  Negative results do not preclude SARS CoV 2 infection, do not rule out coinfections with other pathogens, and should not be used as the sole basis for treatment or other patient management decisions.  Negative results must be combined with clinical  observations, patient history, and epidemiological information. The expected result is Negative. Invalid Presence or absence of SARS CoV 2 nucleic acids cannot be determined. Repeat testing was performed on the submitted specimen and repeated Invalid results were obtained.  If clinically indicated, additional testing on a new specimen with an alternate test methodology [phone]) is advised.  The SARS CoV 2 RNA is generally detectable in upper and lower respiratory specimens during the acute phase of infection. The expected result is Negative. Fact Sheet for Patients:  GolfingFamily.no Fact Sheet for Healthcare Providers: https://www.hernandez-brewer.com/ This test is not yet approved or cleared by the [place] FDA and has been authorized for detection and/or diagnosis of SARS CoV 2 by FDA under an Emergency Use Authorization (EUA).  This EUA will remain in effect (meaning this  test can be used) for the duration of the COVID19 d eclaration under Section 564(b)(1) of the Act, 21 U.S.C. section [phone] 3(b)(1), unless the authorization is terminated or revoked sooner. Performed at [facility], [address] [place], [place] [address]   SARS Coronavirus 2 (CEPHEID - Performed in [facility] hospital lab), Hosp Order     Status: None   Collection Time: 08/25/18  1:05 PM  Result Value Ref Range Status   SARS Coronavirus 2 NEGATIVE NEGATIVE Final    Comment: (NOTE) If result is NEGATIVE SARS-CoV-2 target nucleic acids are NOT DETECTED. The SARS-CoV-2 RNA is generally detectable in upper and lower  respiratory specimens during the acute phase of infection. The lowest  concentration of SARS-CoV-2 viral copies this assay can detect is 250  copies / mL. A negative result does not preclude SARS-CoV-2 infection  and should not be used  as the sole basis for treatment or other  patient management decisions.  A negative result may occur with  improper specimen collection / handling, submission of specimen other  than nasopharyngeal swab, presence of viral mutation(s) within the  areas targeted by this assay, and inadequate number of viral copies  (<250 copies / mL). A negative result must be combined with clinical  observations, patient history, and epidemiological information. If result is POSITIVE SARS-CoV-2 target nucleic acids are DETECTED. The SARS-CoV-2 RNA is generally detectable in upper and lower  respiratory specimens dur ing the acute phase of infection.  Positive  results are indicative of active infection with SARS-CoV-2.  Clinical  correlation with patient history and other diagnostic information is  necessary to determine patient infection status.  Positive results do  not rule out bacterial infection or co-infection with other viruses. If result is PRESUMPTIVE POSTIVE SARS-CoV-2 nucleic acids MAY BE PRESENT.   A presumptive positive result  was obtained on the submitted specimen  and confirmed on repeat testing.  While 2019 novel coronavirus  (SARS-CoV-2) nucleic acids may be present in the submitted sample  additional confirmatory testing may be necessary for epidemiological  and / or clinical management purposes  to differentiate between  SARS-CoV-2 and other Sarbecovirus currently known to infect humans.  If clinically indicated additional testing with an alternate test  methodology [phone]) is advised. The SARS-CoV-2 RNA is generally  detectable in upper and lower respiratory sp ecimens during the acute  phase of infection. The expected result is Negative. Fact Sheet for Patients:  StrictlyIdeas.no Fact Sheet for Healthcare Providers: BankingDealers.co.za This test is not yet approved or cleared by the [place] FDA and has been authorized for detection and/or diagnosis of SARS-CoV-2 by FDA under an Emergency Use Authorization (EUA).  This EUA will remain in effect (meaning this test can be used) for the duration of the COVID-19 declaration under Section 564(b)(1) of the Act, 21 U.S.C. section 360bbb-3(b)(1), unless the authorization is terminated or revoked sooner. Performed at [facility], [place] [address] [place], [place] [address]          Radiology Studies: No results found.      Scheduled Meds: . [MAR Hold] famotidine  40 mg Oral QHS  . [MAR Hold] gabapentin  300 mg Oral QHS  . [MAR Hold] linaclotide  290 mcg Oral QAC breakfast  . [MAR Hold] loratadine  10 mg Oral Daily  . [MAR Hold] pantoprazole  40 mg Oral BID  . [MAR Hold] polyethylene glycol  17 g Oral BID  . [MAR Hold] sotalol  40 mg Oral BID  . [MAR Hold] tamsulosin  0.4 mg Oral Daily  . [MAR Hold] umeclidinium bromide  1 puff Inhalation Daily   Continuous Infusions: . lactated ringers Stopped (08/27/18 1807)  . lactated ringers 20 mL/hr at 08/28/18 1115     LOS: 2 days      Vernell Leep, MD, FACP, [facility]. [facility]  To contact the attending provider between 7A-7P or the covering provider during after hours 7P-7A, please log into the web site [email or web address] and access using universal  password for that web site. If you do not have the password, please call the hospital operator.  08/28/2018, 12:20 PM

## 2018-08-28 NOTE — Anesthesia Postprocedure Evaluation (Signed)
Anesthesia Post Note  Patient: [name]  Procedure(s) Performed: COLONOSCOPY WITH PROPOFOL (N/A ) POLYPECTOMY BIOPSY     Patient location during evaluation: PACU Anesthesia Type: MAC Level of consciousness: awake and alert Pain management: pain level controlled Vital Signs Assessment: post-procedure vital signs reviewed and stable Respiratory status: spontaneous breathing, nonlabored ventilation and respiratory function stable Cardiovascular status: stable and blood pressure returned to baseline Anesthetic complications: no    Last Vitals:  Vitals:   08/28/18 1210 08/28/18 1220  BP: 118/81 124/90  Pulse: 66 64  Resp: 19 17  Temp:    SpO2: 96% 97%    Last Pain:  Vitals:   08/28/18 1220  TempSrc:   PainSc: 0-No pain                 Audry Pili

## 2018-08-28 NOTE — Transfer of Care (Signed)
Immediate Anesthesia Transfer of Care Note  Patient: [name]  Procedure(s) Performed: COLONOSCOPY WITH PROPOFOL (N/A ) POLYPECTOMY BIOPSY  Patient Location: Endoscopy Unit  Anesthesia Type:MAC  Level of Consciousness: drowsy  Airway & Oxygen Therapy: Patient Spontanous Breathing and Patient connected to face mask oxygen  Post-op Assessment: Report given to RN and Post -op Vital signs reviewed and stable  Post vital signs: Reviewed and stable  Last Vitals:  Vitals Value Taken Time  BP 102/72 08/28/2018 12:00 PM  Temp 36.7 C 08/28/2018 12:00 PM  Pulse 68 08/28/2018 12:04 PM  Resp 15 08/28/2018 12:04 PM  SpO2 97 % 08/28/2018 12:04 PM  Vitals shown include unvalidated device data.  Last Pain:  Vitals:   08/28/18 1200  TempSrc: Oral  PainSc: Asleep         Complications: No apparent anesthesia complications

## 2018-08-28 NOTE — Op Note (Addendum)
[facility] Patient Name: [name] Procedure Date : 08/28/2018 MRN: [id number] Attending MD: Thornton Park MD, MD Date of Birth: [birth year] CSN: [id number] Age: 58 Admit Type: Inpatient Procedure:                Colonoscopy Indications:              Heme positive stool with progressive anemia Providers:                Thornton Park MD, MD, Jobe Igo, RN,                            Ladona Ridgel, Technician, Charolette Child,                            Technician Referring MD:              Medicines:                See the Anesthesia note for documentation of the                            administered medications Complications:            No immediate complications. Estimated blood loss:                            Minimal. Estimated Blood Loss:     Estimated blood loss was minimal. Procedure:                Pre-Anesthesia Assessment:                           - Prior to the procedure, a History and Physical                            was performed, and patient medications and                            allergies were reviewed. The patient's tolerance of                            previous anesthesia was also reviewed. The risks                            and benefits of the procedure and the sedation                            options and risks were discussed with the patient.                            All questions were answered, and informed consent                            was obtained. Prior Anticoagulants: The patient has                            taken Eliquis (apixaban), last  dose was 4 days                            prior to procedure. ASA Grade Assessment: III - A                            patient with severe systemic disease. After                            reviewing the risks and benefits, the patient was                            deemed in satisfactory condition to undergo the                            procedure.         After obtaining informed consent, the colonoscope                            was passed under direct vision. Throughout the                            procedure, the patient's blood pressure, pulse, and                            oxygen saturations were monitored continuously. The                            PCF-H190DL ([id number]) Olympus pediatric colonoscope                            was introduced through the anus and advanced to the                            the cecum, identified by appendiceal orifice and                            ileocecal valve. The colonoscopy was performed with                            moderate difficulty due to a redundant colon,                            significant looping and a tortuous colon.                            Successful completion of the procedure was aided by                            applying abdominal pressure. The patient tolerated                            the procedure well. The quality of the bowel  preparation was poor. The ileocecal valve,                            appendiceal orifice, and rectum were photographed. Scope In: 11:36:38 AM Scope Out: 11:55:08 AM Scope Withdrawal Time: 0 hours 12 minutes 31 seconds  Total Procedure Duration: 0 hours 18 minutes 30 seconds  Findings:      The perianal and digital rectal examinations were normal.      A 2 mm polyp was found in the transverse colon. The polyp was sessile.       The polyp was removed with a cold snare. Resection and retrieval were       complete. Estimated blood loss was minimal.      Multiple small and large-mouthed diverticula were found in the sigmoid       colon and descending colon and ascending colon.      A localized area of mildly erythematous mucosa was found in the distal       sigmoid colon from 30 to 35 cm in the area of diverticulosis. The       findings are very mild and the clinical significance is unclear.       Biopsies were  taken with a cold forceps for histology. Estimated blood       loss was minimal.      Non-bleeding internal hemorrhoids were found.      The exam was otherwise without abnormality on direct and retroflexion       views. Stool was present in the transverse colon and right colon       limiting a complete evaluation for small and medium sized lesions. Impression:               - Preparation of the colon was poor.                           - One 2 mm polyp in the transverse colon, removed                            with a cold snare. Resected and retrieved.                           - Diverticulosis in the sigmoid colon and in the                            descending colon.                           - Erythematous mucosa in the distal sigmoid colon.                            Biopsied.                           - Non-bleeding internal hemorrhoids.                           - Source of anemia was not identified on this  examination.                           - The examination was otherwise normal on direct                            and retroflexion views. Recommendation:           - Clear liquid diet today. High fiber diet in the                            future.                           - Continue present medications.                           - Await pathology results.                           - Repeat colonoscopy in 1-2 years because the bowel                            preparation was poor.                           - To visualize the small bowel and complete his                            evaluation for source for GI blood loss anemia,                            perform video capsule endoscopy tomorrow. Procedure Code(s):        --- Professional ---                           [phone], Colonoscopy, flexible; with removal of                            tumor(s), polyp(s), or other lesion(s) by snare                            technique                            [id number], 46, Colonoscopy, flexible; with biopsy,                            single or multiple Diagnosis Code(s):        --- Professional ---                           K64.8, Other hemorrhoids                           K63.5, Polyp of colon  K63.89, Other specified diseases of intestine                           R19.5, Other fecal abnormalities                           K57.30, Diverticulosis of large intestine without                            perforation or abscess without bleeding CPT copyright 2019 American Medical Association. All rights reserved. The codes documented in this report are preliminary and upon coder review may  be revised to meet current compliance requirements. Thornton Park MD, MD 08/28/2018 12:15:32 PM This report has been signed electronically. Number of Addenda: 0

## 2018-08-28 NOTE — Interval H&P Note (Signed)
History and Physical Interval Note:  08/28/2018 11:18 AM  [name]  has presented today for surgery, with the diagnosis of iron def anemia.  The various methods of treatment have been discussed with the patient and family. After consideration of risks, benefits and other options for treatment, the patient has consented to  Procedure(s): COLONOSCOPY WITH PROPOFOL (N/A) as a surgical intervention.  The patient's history has been reviewed, patient examined, no change in status, stable for surgery.  I have reviewed the patient's chart and labs.  Questions were answered to the patient's satisfaction.     Thornton Park

## 2018-08-28 NOTE — Progress Notes (Signed)
Dinner tray acquired for pt - regular to start at 2113

## 2018-08-28 NOTE — Anesthesia Preprocedure Evaluation (Addendum)
Anesthesia Evaluation  Patient identified by MRN, date of birth, ID band Patient awake    Reviewed: Allergy & Precautions, NPO status , Patient's Chart, lab work & pertinent test results  History of Anesthesia Complications Negative for: history of anesthetic complications  Airway Mallampati: II  TM Distance: >3 FB Neck ROM: Limited    Dental  (+) Dental Advisory Given, Partial Upper   Pulmonary sleep apnea , COPD,  COPD inhaler,  SARS Coronavirus NEG   breath sounds clear to auscultation       Cardiovascular hypertension, Pt. on medications + dysrhythmias Atrial Fibrillation and Supra Ventricular Tachycardia  Rhythm:Irregular Rate:Normal  '18 stress: Normal Resting BP with Appropriate Response,Negative Perfusion ECG Study  '18 ECHO: EF 60-65%, valves OK   Neuro/Psych CVA, No Residual Symptoms negative psych ROS   GI/Hepatic Neg liver ROS, GERD  Medicated,  Endo/Other    Renal/GU Renal InsufficiencyRenal disease (creat 1.47)     Musculoskeletal negative musculoskeletal ROS (+)   Abdominal   Peds  Hematology  (+) Blood dyscrasia (Hb 8.3), Sickle cell trait and anemia , INR 1.6 xarelto   Anesthesia Other Findings   Reproductive/Obstetrics                            Anesthesia Physical Anesthesia Plan  ASA: III  Anesthesia Plan: MAC   Post-op Pain Management:    Induction: Intravenous  PONV Risk Score and Plan: 1 and Treatment may vary due to age or medical condition and Propofol infusion  Airway Management Planned: Natural Airway and Simple Face Mask  Additional Equipment: None  Intra-op Plan:   Post-operative Plan:   Informed Consent: I have reviewed the patients History and Physical, chart, labs and discussed the procedure including the risks, benefits and alternatives for the proposed anesthesia with the patient or authorized representative who has indicated his/her  understanding and acceptance.       Plan Discussed with: CRNA and Anesthesiologist  Anesthesia Plan Comments:        Anesthesia Quick Evaluation

## 2018-08-28 NOTE — Care Management Important Message (Signed)
Important Message  Patient Details  Name: [name] MRN: [id number] Date of Birth: 06-14-1960   Medicare Important Message Given:  Yes    Wenonah Milo 08/28/2018, 1:50 PM

## 2018-08-29 DIAGNOSIS — K5900 Constipation, unspecified: Secondary | ICD-10-CM

## 2018-08-29 DIAGNOSIS — N182 Chronic kidney disease, stage 2 (mild): Secondary | ICD-10-CM

## 2018-08-29 LAB — CBC
HCT: 27 % — ABNORMAL LOW (ref 39.0–52.0)
Hemoglobin: 8.4 g/dL — ABNORMAL LOW (ref 13.0–17.0)
MCH: 22.5 pg — ABNORMAL LOW (ref 26.0–34.0)
MCHC: 31.1 g/dL (ref 30.0–36.0)
MCV: 72.4 fL — ABNORMAL LOW (ref 80.0–100.0)
Platelets: 267 10*3/uL (ref 150–400)
RBC: 3.73 MIL/uL — ABNORMAL LOW (ref 4.22–5.81)
RDW: 15.6 % — ABNORMAL HIGH (ref 11.5–15.5)
WBC: 8.4 10*3/uL (ref 4.0–10.5)
nRBC: 0.4 % — ABNORMAL HIGH (ref 0.0–0.2)

## 2018-08-29 LAB — BASIC METABOLIC PANEL
Anion gap: 10 (ref 5–15)
BUN: 7 mg/dL (ref 6–20)
CO2: 21 mmol/L — ABNORMAL LOW (ref 22–32)
Calcium: 8.6 mg/dL — ABNORMAL LOW (ref 8.9–10.3)
Chloride: 109 mmol/L (ref 98–111)
Creatinine, Ser: 1.47 mg/dL — ABNORMAL HIGH (ref 0.61–1.24)
GFR calc Af Amer: 60 mL/min — ABNORMAL LOW (ref >60)
GFR calc non Af Amer: 51 mL/min — ABNORMAL LOW (ref >60)
Glucose, Bld: 90 mg/dL (ref 70–99)
Potassium: 3.5 mmol/L (ref 3.5–5.1)
Sodium: 140 mmol/L (ref 135–145)

## 2018-08-29 MED ORDER — POLYETHYLENE GLYCOL 3350 17 G PO PACK: 17.0000 g | PACK | Freq: Two times a day (BID) | ORAL | 0 refills | Status: DC

## 2018-08-29 MED ORDER — POTASSIUM CHLORIDE CRYS ER 20 MEQ PO TBCR
40.0000 meq | EXTENDED_RELEASE_TABLET | Freq: Once | ORAL | Status: AC
  Administered 2018-08-29: 40 meq via ORAL
  Filled 2018-08-29: qty 2

## 2018-08-29 MED ORDER — LINACLOTIDE 290 MCG PO CAPS: 290.0000 ug | ORAL_CAPSULE | Freq: Every day | ORAL | 0 refills | Status: DC

## 2018-08-29 NOTE — Discharge Instructions (Signed)

## 2018-08-29 NOTE — Discharge Summary (Signed)
Physician Discharge Summary  [name] ESP:[id number] DOB: [birth year]  PCP: [name], MD  Admit date: 08/25/2018 Discharge date: 08/29/2018  Recommendations for Outpatient Follow-up:  1. Dr. Horald Pollen, PCP in 1 week with repeat labs (CBC & BMP). 2. Lennox Solders, PA-C/GI, as needed.  Advised patient to call on 5/25 regarding a follow-up appointment.  Home Health: None Equipment/Devices: None  Discharge Condition: Improved and stable CODE STATUS: Full Diet recommendation: Heart healthy diet.  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 fib on Xarelto, OSA, GERD, sickle cell trait, recent hematuria 3 weeks ago evaluated by Urology as outpatient, presented to ED with 1 to 2-week history of black tarry stools, intermittent lower abdominal discomfort, progressive fatigue and tiredness especially with exertion.  He was admitted for symptomatic anemia in the context of suspected GI bleeding.  Hemoglobin slowly drifted down but did not required PRBCs.  [facility] consulted, status post small bowel enteroscopy 5/21 without bleeding source and colonoscopy 5/22 which also did not reveal bleeding source.  Capsule endoscopy 5/22 revealed mild duodenitis.  Assessment & Plan:  1. GI bleeding: Patient presented with black tarry stools of 1 to 2 weeks duration.  Patient on Xarelto for A. fib PTA.  Some NSAID intake history.  [facility] consulted.  Xarelto held.  Patient advised to stop NSAID use.  Placed on IV PPI,  transitioned to oral post EGD.  FOBT +. Status post small bowel enteroscopy 5/21 without bleeding source and colonoscopy 5/22 which also did not reveal bleeding source.  Detailed report of procedures as below.   He underwent capsule endoscopy overnight 5/23.  I discussed with Dr. Benson Norway who advised that the capsule endoscopy showed mild duodenitis.  He recommended that the patient could be discharged home on once daily PPI that patient was on PTA, continue Linzess, no need of Pepcid, can resume Xarelto and outpatient follow-up with primary GI.  The exact etiology of his GI bleed despite extensive work-up remained elusive.  Melena seems to have resolved.  Hemoglobin remained stable.  No clinical overt GI bleeding.  [facility] to follow up on final pathology results and communicate with patient's as needed. 2. Iron deficiency anemia/acute blood loss anemia, symptomatic: Approximately 2 g hemoglobin drop on admission but since then hemoglobin has gradually drifted down from 9.9-8.3.  Status post IV iron 5/20, oral supplements as outpatient after acute GI issues have resolved.  Received a dose of IM B12 for low normal B12 levels.    Hemoglobin stable in the 8.3-8.4 range.  Follow-up CBC closely as outpatient. 3. Chronic constipation: KUB negative for obstruction but did show moderate amount of stool in the ascending colon.  Underwent bowel prep for colonoscopy.  Continue Linzess and MiraLAX.   4. GERD/esophageal dysmotility: Continue PPI. 5. PAF: Continue sotalol.  Clinically in sinus rhythm.  Xarelto was briefly held while GI bleeding was evaluated.  Xarelto resumed at discharge.  Outpatient follow-up with Cardiology at [facility]. 6. Essential hypertension:  Mildly uncontrolled at times.    Resume amlodipine that was  held in the hospital.  Discontinued Dyazide indefinitely because of chronic kidney disease and patient on furosemide.  Continue sotalol. 7. Stage II chronic kidney disease: Stable.  Periodically follow  BMP.  Current creatinine likely at baseline.  He had creatinine of 1.5 on 5/11 and 1/6. 8. Hypokalemia: Replaced prior to discharge.  Magnesium normal.. 9. Overweight/Body mass index is 29.84 kg/m. 10. Chronic pain: Counseled regarding not using NSAIDs and he verbalized understanding.  Patient noted to be on duplicate opioids.  Discussed with him and he states that he is on only oxycodone.  Thereby discontinued other opioids.  Outpatient follow-up with PCP. 11. Chronic diastolic CHF: Compensated.  Continue prior home dose of Lasix.   Consultants:  [facility]  Procedures:   Small bowel enteroscopy 08/27/2018:  Impression: - Widely patent Schatzki ring. - Small hiatal hernia. No Cameron's erosions present. - No esophagitis. Reflux is likely non-erosive esophagitis or related to known esophageal motility disorder. - Normal stomach. No blood seen. - Three non-bleeding lymphangectasias in the duodenum. Biopsied. No blood seen in the duodenum. - Normal examined duodenum. Biopsied. - Source of anemia not identified on this examination.  Recommendation: - Clear liquid diet today. - Await pathology results. - Continue reflux medications. Follow-up with Dr. Marin Comment as an outpatient given his prior diagnosis of esophageal dysmotility. - Outpatient follow-up with Dr. Mitchell Heir in [facility] versus proceeding with colonoscopy/capsule endoscopy was discussed with the patient by [facility]. He was anxious about the source of bleeding and preferred to complete the work-up while hospitalized which was felt reasonable especially because he is on anticoagulation and wanted to make sure to address any bleeding sources.    Colonoscopy 08/28/2018:  Impression: - Preparation of the colon was poor. - One 2 mm polyp in the transverse colon, removed with a cold snare. Resected and retrieved. - Diverticulosis in the sigmoid colon and in the descending colon. - Erythematous mucosa in the distal sigmoid  colon. Biopsied. - Non-bleeding internal hemorrhoids. - Source of anemia was not identified on this examination. - The examination was otherwise normal on direct and retroflexion views.  Recommendation: - Clear liquid diet today. High fiber diet in the future. - Continue present medications. - Await pathology results. - Repeat colonoscopy in 1-2 years because the bowel preparation was poor. - To visualize the small bowel and complete his evaluation for source for GI blood loss anemia, perform video capsule endoscopy tomorrow.    Discharge Instructions  Discharge Instructions    (HEART FAILURE PATIENTS) Call MD:  Anytime you have any of the following symptoms: 1) 3 pound weight gain in 24 hours or 5 pounds in 1 week 2) shortness of breath, with or without a dry hacking cough 3) swelling in the hands, feet or stomach 4) if you have to sleep on extra pillows at night in order to breathe.   Complete by:  As directed    Call MD for:   Complete by:  As directed    Persistent or recurrent black tarry stools or blood in stools.   Call MD for:  difficulty breathing, headache or visual disturbances   Complete by:  As directed    Call MD for:  extreme fatigue   Complete by:  As directed    Call MD for:  persistant dizziness or light-headedness   Complete by:  As directed    Call MD for:  persistant nausea and vomiting   Complete by:  As directed    Call MD for:  severe uncontrolled pain   Complete by:  As directed    Diet - low sodium heart healthy   Complete by:  As directed    Increase activity slowly   Complete by:  As directed        Medication List    STOP taking these medications   oxycodone-acetaminophen 7.5-300 MG tablet Commonly known as:  LYNOX   traMADol 50 MG tablet Commonly known as:  ULTRAM   triamterene-hydrochlorothiazide 37.5-25 MG capsule Commonly known as:  DYAZIDE     TAKE these medications   amLODipine 10 MG tablet Commonly known as:  NORVASC Take 1  tablet (10 mg total) by mouth daily.   cetirizine 10 MG tablet Commonly known as:  ZYRTEC Take 10 mg by mouth daily.   furosemide 20 MG tablet Commonly known as:  LASIX Take 20 mg by mouth daily.   gabapentin 300 MG capsule Commonly known as:  NEURONTIN Take 300 mg by mouth at bedtime.   linaclotide 290 MCG Caps capsule Commonly known as:  LINZESS Take 1 capsule (290 mcg total) by mouth daily before breakfast. Start taking on:  Aug 30, 2018   multivitamin with minerals tablet Take 1 tablet by mouth daily.   nitroGLYCERIN 0.4 MG SL tablet Commonly known as:  NITROSTAT Place 1 tablet (0.4 mg total) under the tongue every 5 (five) minutes as needed for chest pain.   oxyCODONE 5 MG immediate release tablet Commonly known as:  Oxy IR/ROXICODONE Take 1 tablet (5 mg total) by mouth every 6 (six) hours as needed for moderate pain or severe pain.   pantoprazole 40 MG tablet Commonly known as:  PROTONIX Take 40 mg by mouth daily.   polyethylene glycol 17 g packet Commonly known as:  MIRALAX / GLYCOLAX Take 17 g by mouth 2 (two) times daily.   sotalol 80 MG tablet Commonly known as:  BETAPACE Take 40 mg by mouth 2 (two) times daily.   SUMAtriptan 50 MG tablet Commonly known as:  IMITREX Take 50-100 mg by mouth once as needed for migraine.   tamsulosin 0.4 MG Caps capsule Commonly known as:  FLOMAX Take 0.4 mg by mouth daily.   tiotropium 18 MCG inhalation capsule Commonly known as:  SPIRIVA Place 18 mcg into inhaler and inhale daily.   Xarelto 20 MG Tabs tablet Generic drug:  rivaroxaban Take 20 mg by mouth daily.      Follow-up Information    Lennox Solders, PA-C Follow up.   Specialty:  Gastroenterology Why:  follow up with GI PA as needed.   Contact information: [address] [place] [address] [phone]        [name], MD. Schedule an appointment as soon as possible for a visit in 1 week(s).   Specialty:  Family  Medicine Why:  To be seen with repeat labs (CBC & BMP). Contact information: [place] [address] [phone]          No Known Allergies    Procedures/Studies: Dg Chest 2 View  Result Date: 08/26/2018 CLINICAL DATA:  Short of breath EXAM: CHEST - 2 VIEW COMPARISON:  12/03/2017 FINDINGS: The heart size and mediastinal contours are within normal limits. Both lungs are clear. The visualized skeletal structures are unremarkable. IMPRESSION: No active cardiopulmonary disease. Electronically Signed   By: Franchot Gallo M.D.   On: 08/26/2018 13:10   Dg Abd 1 View  Result Date: 08/26/2018 CLINICAL DATA:  Constipation few  days, shortness of EXAM: ABDOMEN - 1 VIEW COMPARISON:  None. FINDINGS: There is a moderate amount of stool in the ascending colon. There is no bowel dilatation to suggest obstruction. There is no evidence of pneumoperitoneum, portal venous gas or pneumatosis. There are no pathologic calcifications along the expected course of the ureters. The osseous structures are unremarkable. IMPRESSION: No acute abdominal abnormality. Electronically Signed   By: Kathreen Devoid   On: 08/26/2018 13:11      Subjective: Patient reported a BM after colonoscopy yesterday which was brown with a tinge of blood.  No BM since.  Intermittent lower abdominal discomfort.  No nausea or vomiting.  Tolerating diet.  Ambulated without dizziness, lightheadedness, dyspnea or chest pain.  As per RN, no acute issues reported.  Discharge Exam:  Vitals:   08/28/18 1737 08/28/18 2227 08/29/18 0729 08/29/18 0734  BP: (!) 133/92 123/86 (!) 134/97   Pulse: 65 69 67   Resp: 18 (!) 158 16   Temp: 98.9 F (37.2 C) 98.9 F (37.2 C) 98.6 F (37 C)   TempSrc: Oral Oral Oral   SpO2: 100% 97% 100% 99%  Weight:      Height:        General exam: Pleasant young male, moderately built and overweight sitting up comfortably in bed without distress.  Oral mucosa moist. Respiratory  system: Clear to auscultation. Respiratory effort normal. Cardiovascular system: S1 & S2 heard, RRR. No JVD, murmurs, rubs, gallops or clicks. No pedal edema. Gastrointestinal system: Abdomen is nondistended, soft and nontender. No organomegaly or masses felt. Normal bowel sounds heard. Central nervous system: Alert and oriented. No focal neurological deficits. Extremities: Symmetric 5 x 5 power. Skin: No rashes, lesions or ulcers Psychiatry: Judgement and insight appear normal. Mood & affect appropriate.     The results of significant diagnostics from this hospitalization (including imaging, microbiology, ancillary and laboratory) are listed below for reference.     Microbiology: Recent Results (from the past 240 hour(s))  SARS Coronavirus 2 (Hosp order,Performed in [facility] lab via [employer])     Status: None   Collection Time: 08/25/18  9:24 AM  Result Value Ref Range Status   SARS Coronavirus 2 ([employer] Now) NEGATIVE NEGATIVE Final    Comment: (NOTE) Interpretive Result Comment(s): COVID 19 Positive SARS CoV 2 target nucleic acids are DETECTED. The SARS CoV 2 RNA is generally detectable in upper and lower respiratory specimens during the acute phase of infection.  Positive results are indicative of active infection with SARS CoV 2.  Clinical correlation with patient history and other diagnostic information is necessary to determine patient infection status.  Positive results do not rule out bacterial infection or coinfection with other viruses. The expected result is Negative. COVID 19 Negative SARS CoV 2 target nucleic acids are NOT DETECTED. The SARS CoV 2 RNA is generally detectable in upper and lower respiratory specimens during the acute phase of infection.  Negative results do not preclude SARS CoV 2 infection, do not rule out coinfections with other pathogens, and should not be used as the sole basis for treatment or other patient management decisions.   Negative results must be combined with clinical  observations, patient history, and epidemiological information. The expected result is Negative. Invalid Presence or absence of SARS CoV 2 nucleic acids cannot be determined. Repeat testing was performed on the submitted specimen and repeated Invalid results were obtained.  If clinically indicated, additional testing on a new specimen with an alternate test methodology (  [id number]) is advised.  The SARS CoV 2 RNA is generally detectable in upper and lower respiratory specimens during the acute phase of infection. The expected result is Negative. Fact Sheet for Patients:  GolfingFamily.no Fact Sheet for Healthcare Providers: https://www.hernandez-brewer.com/ This test is not yet approved or cleared by the [place] FDA and has been authorized for detection and/or diagnosis of SARS CoV 2 by FDA under an Emergency Use Authorization (EUA).  This EUA will remain in effect (meaning this test can be used) for the duration of the COVID19 d eclaration under Section 564(b)(1) of the Act, 21 U.S.C. section [phone] 3(b)(1), unless the authorization is terminated or revoked sooner. Performed at [facility], [address] [place], [place] [address]   SARS Coronavirus 2 (CEPHEID - Performed in [id number] hospital lab), Hosp Order     Status: None   Collection Time: 08/25/18  1:05 PM  Result Value Ref Range Status   SARS Coronavirus 2 NEGATIVE NEGATIVE Final    Comment: (NOTE) If result is NEGATIVE SARS-CoV-2 target nucleic acids are NOT DETECTED. The SARS-CoV-2 RNA is generally detectable in upper and lower  respiratory specimens during the acute phase of infection. The lowest  concentration of SARS-CoV-2 viral copies this assay can detect is 250  copies / mL. A negative result does not preclude SARS-CoV-2 infection  and should not be used as the sole basis for treatment or other  patient  management decisions.  A negative result may occur with  improper specimen collection / handling, submission of specimen other  than nasopharyngeal swab, presence of viral mutation(s) within the  areas targeted by this assay, and inadequate number of viral copies  (<250 copies / mL). A negative result must be combined with clinical  observations, patient history, and epidemiological information. If result is POSITIVE SARS-CoV-2 target nucleic acids are DETECTED. The SARS-CoV-2 RNA is generally detectable in upper and lower  respiratory specimens dur ing the acute phase of infection.  Positive  results are indicative of active infection with SARS-CoV-2.  Clinical  correlation with patient history and other diagnostic information is  necessary to determine patient infection status.  Positive results do  not rule out bacterial infection or co-infection with other viruses. If result is PRESUMPTIVE POSTIVE SARS-CoV-2 nucleic acids MAY BE PRESENT.   A presumptive positive result was obtained on the submitted specimen  and confirmed on repeat testing.  While 2019 novel coronavirus  (SARS-CoV-2) nucleic acids may be present in the submitted sample  additional confirmatory testing may be necessary for epidemiological  and / or clinical management purposes  to differentiate between  SARS-CoV-2 and other Sarbecovirus currently known to infect humans.  If clinically indicated additional testing with an alternate test  methodology [phone]) is advised. The SARS-CoV-2 RNA is generally  detectable in upper and lower respiratory sp ecimens during the acute  phase of infection. The expected result is Negative. Fact Sheet for Patients:  StrictlyIdeas.no Fact Sheet for Healthcare Providers: BankingDealers.co.za This test is not yet approved or cleared by the [place] FDA and has been authorized for detection and/or diagnosis of SARS-CoV-2 by FDA under  an Emergency Use Authorization (EUA).  This EUA will remain in effect (meaning this test can be used) for the duration of the COVID-19 declaration under Section 564(b)(1) of the Act, 21 U.S.C. section 360bbb-3(b)(1), unless the authorization is terminated or revoked sooner. Performed at [facility], [place] [address], [place], [place] [address]  Labs: CBC: Recent Labs  Lab 08/25/18 0821  08/25/18 2329 08/26/18 0758 08/27/18 0415 08/28/18 0511 08/29/18 0509  WBC 7.0   < > 8.0 8.1 7.1 8.0 8.4  NEUTROABS 3.6  --   --   --   --   --   --   HGB 9.9*   < > 8.8* 9.8* 8.8* 8.3* 8.4*  HCT 32.5*   < > 27.6* 31.1* 27.9* 26.4* 27.0*  MCV 73.4*   < > 71.3* 71.5* 72.3* 71.4* 72.4*  PLT 277   < > 267 285 246 250 267   < > = values in this interval not displayed.   Basic Metabolic Panel: Recent Labs  Lab 08/25/18 2329 08/26/18 0758 08/27/18 0415 08/28/18 0511 08/29/18 0509  NA 138 139 141 142 140  K 2.9* 3.4* 3.5 3.3* 3.5  CL 107 105 110 108 109  CO2 26 24 23 23  21*  GLUCOSE 111* 120* 103* 88 90  BUN 12 13 12 8 7   CREATININE 1.38* 1.39* 1.49* 1.47* 1.47*  CALCIUM 8.8* 9.1 8.7* 8.8* 8.6*  MG  --   --  2.1  --   --    Liver Function Tests: Recent Labs  Lab 08/25/18 0821  AST 30  ALT 22  ALKPHOS 72  BILITOT 0.6  PROT 8.1  ALBUMIN 4.2     Time coordinating discharge: 40 minutes  SIGNED:  Vernell Leep, MD, FACP, [facility]  To contact the attending provider between 7A-7P or the covering provider during after hours 7P-7A, please log into the web site [email or web address] and access using universal [employer] password for that web site. If you do not have the password, please call the hospital operator.

## 2018-08-31 ENCOUNTER — Encounter (HOSPITAL_COMMUNITY): Payer: Self-pay | Admitting: Gastroenterology

## 2018-09-01 ENCOUNTER — Encounter: Payer: Self-pay | Admitting: Gastroenterology

## 2018-09-02 ENCOUNTER — Telehealth: Payer: Self-pay | Admitting: *Deleted

## 2018-09-02 ENCOUNTER — Encounter: Payer: Self-pay | Admitting: *Deleted

## 2018-09-02 NOTE — Telephone Encounter (Signed)
Recall for one year colon in Epic, letter for follow up appt sent after calling and not getting an answer.

## 2018-09-05 ENCOUNTER — Telehealth: Payer: Self-pay | Admitting: Nurse Practitioner

## 2018-09-05 ENCOUNTER — Other Ambulatory Visit: Payer: Self-pay

## 2018-09-05 ENCOUNTER — Encounter (HOSPITAL_BASED_OUTPATIENT_CLINIC_OR_DEPARTMENT_OTHER): Payer: Self-pay | Admitting: Emergency Medicine

## 2018-09-05 ENCOUNTER — Emergency Department (HOSPITAL_BASED_OUTPATIENT_CLINIC_OR_DEPARTMENT_OTHER)
Admission: EM | Admit: 2018-09-05 | Discharge: 2018-09-05 | Disposition: A | Discharge status: Home / Self Care | Payer: Medicare Other | Attending: Emergency Medicine | Admitting: Emergency Medicine

## 2018-09-05 DIAGNOSIS — Z79899 Other long term (current) drug therapy: Secondary | ICD-10-CM | POA: Insufficient documentation

## 2018-09-05 DIAGNOSIS — K922 Gastrointestinal hemorrhage, unspecified: Secondary | ICD-10-CM | POA: Insufficient documentation

## 2018-09-05 DIAGNOSIS — Z7901 Long term (current) use of anticoagulants: Secondary | ICD-10-CM | POA: Diagnosis not present

## 2018-09-05 DIAGNOSIS — D573 Sickle-cell trait: Secondary | ICD-10-CM | POA: Insufficient documentation

## 2018-09-05 DIAGNOSIS — J449 Chronic obstructive pulmonary disease, unspecified: Secondary | ICD-10-CM | POA: Diagnosis not present

## 2018-09-05 DIAGNOSIS — I129 Hypertensive chronic kidney disease with stage 1 through stage 4 chronic kidney disease, or unspecified chronic kidney disease: Secondary | ICD-10-CM | POA: Diagnosis not present

## 2018-09-05 DIAGNOSIS — R195 Other fecal abnormalities: Secondary | ICD-10-CM | POA: Diagnosis present

## 2018-09-05 DIAGNOSIS — N183 Chronic kidney disease, stage 3 (moderate): Secondary | ICD-10-CM | POA: Insufficient documentation

## 2018-09-05 DIAGNOSIS — K921 Melena: Secondary | ICD-10-CM

## 2018-09-05 LAB — COMPREHENSIVE METABOLIC PANEL
ALT: 24 U/L (ref 0–44)
AST: 26 U/L (ref 15–41)
Albumin: 4.4 g/dL (ref 3.5–5.0)
Alkaline Phosphatase: 82 U/L (ref 38–126)
Anion gap: 8 (ref 5–15)
BUN: 13 mg/dL (ref 6–20)
CO2: 26 mmol/L (ref 22–32)
Calcium: 9.2 mg/dL (ref 8.9–10.3)
Chloride: 103 mmol/L (ref 98–111)
Creatinine, Ser: 1.54 mg/dL — ABNORMAL HIGH (ref 0.61–1.24)
GFR calc Af Amer: 56 mL/min — ABNORMAL LOW (ref >60)
GFR calc non Af Amer: 49 mL/min — ABNORMAL LOW (ref >60)
Glucose, Bld: 98 mg/dL (ref 70–99)
Potassium: 3.2 mmol/L — ABNORMAL LOW (ref 3.5–5.1)
Sodium: 137 mmol/L (ref 135–145)
Total Bilirubin: 0.8 mg/dL (ref 0.3–1.2)
Total Protein: 8.5 g/dL — ABNORMAL HIGH (ref 6.5–8.1)

## 2018-09-05 LAB — CBC WITH DIFFERENTIAL/PLATELET
Abs Immature Granulocytes: 0.05 10*3/uL (ref 0.00–0.07)
Basophils Absolute: 0 10*3/uL (ref 0.0–0.1)
Basophils Relative: 1 %
Eosinophils Absolute: 0.3 10*3/uL (ref 0.0–0.5)
Eosinophils Relative: 4 %
HCT: 38.7 % — ABNORMAL LOW (ref 39.0–52.0)
Hemoglobin: 11.9 g/dL — ABNORMAL LOW (ref 13.0–17.0)
Immature Granulocytes: 1 %
Lymphocytes Relative: 25 %
Lymphs Abs: 1.9 10*3/uL (ref 0.7–4.0)
MCH: 23.2 pg — ABNORMAL LOW (ref 26.0–34.0)
MCHC: 30.7 g/dL (ref 30.0–36.0)
MCV: 75.4 fL — ABNORMAL LOW (ref 80.0–100.0)
Monocytes Absolute: 0.8 10*3/uL (ref 0.1–1.0)
Monocytes Relative: 10 %
Neutro Abs: 4.6 10*3/uL (ref 1.7–7.7)
Neutrophils Relative %: 59 %
Platelets: 293 10*3/uL (ref 150–400)
RBC: 5.13 MIL/uL (ref 4.22–5.81)
RDW: 19.4 % — ABNORMAL HIGH (ref 11.5–15.5)
WBC: 7.6 10*3/uL (ref 4.0–10.5)
nRBC: 0 % (ref 0.0–0.2)

## 2018-09-05 LAB — OCCULT BLOOD X 1 CARD TO LAB, STOOL: Fecal Occult Bld: POSITIVE — AB

## 2018-09-05 NOTE — ED Notes (Signed)
Pt family member requested update. Pt verbalized permission to speak with Mardene Celeste to give update on care.

## 2018-09-05 NOTE — ED Provider Notes (Signed)
[facility] EMERGENCY DEPARTMENT Provider Note   CSN: [id number] Arrival date & time: 09/05/18  1301    History   Chief Complaint Chief Complaint  Patient presents with  . Rectal Bleeding    HPI TOME WILSON is a 58 y.o. male.     Patient is a 58 year old male with a history of chronic kidney disease, paroxysmal atrial fibrillation on Xarelto, chronic abdominal pain who is presenting today with dark black stools for the last 3 days and just general fatigue.  Patient was admitted on 08/27/2018 after having upper GI bleeding with no known source.  Patient's Xarelto was held at the time and he had an enteroscopy, colonoscopy and capsule evaluation with no specific location for the bleed.  However patient symptoms improved spontaneously.  Hemoglobin dropped to 8.3 but he did not require transfusion.  His Xarelto was restarted at discharge on 08/29/2018.  Patient states that he followed up with his doctor on Tuesday because he was having a headache but started noticing some mild change in his stool.  He had blood work drawn but nobody ever called him about what his repeat hemoglobin was.  Patient did start on iron several days ago but noticed that his stool is now significantly darker and he is feeling very tired and weak again and spoke with [facility] who recommended he come here to be rechecked.  Patient denies any shortness of breath or near syncope at this time.  He is not having any chest pain.  He has had a poor appetite but denies any new abdominal pain.  The history is provided by the patient.  Rectal Bleeding  Quality:  Black and tarry Amount:  Moderate Duration:  3 days Timing:  Constant Chronicity:  Recurrent Context: constipation and spontaneously   Context: not hemorrhoids and not rectal pain   Similar prior episodes: yes   Relieved by:  None tried Worsened by:  Nothing Ineffective treatments: iron. Associated symptoms: no abdominal pain, no dizziness, no  fever, no light-headedness and no loss of consciousness   Risk factors: anticoagulant use   Risk factors: no NSAID use and no steroid use     Past Medical History:  Diagnosis Date  . Chest pain    a. 11/2002 Cath: EF 68%, LM nl, LAD small, nl, LCX nl, RCA tortuous/nl;  b. 08/2011 St Echo: EF 60%, normal contractility, no scar/ischemia.  Marland Kitchen COPD (chronic obstructive pulmonary disease) ([place])   . Enlarged prostate   . GERD (gastroesophageal reflux disease)   . GI bleeding 08/2018  . Hypertension   . Sickle cell trait ([place])   . Sleep apnea   . SVT (supraventricular tachycardia) [facility])     Patient Active Problem List   Diagnosis Date Noted  . Adenomatous polyp of transverse colon   . Gastrointestinal hemorrhage 08/25/2018  . CKD (chronic kidney disease) stage 3, GFR 30-59 ml/min (HCC) 08/25/2018  . PAF (paroxysmal atrial fibrillation) ([place]) 08/25/2018  . Atypical chest pain 04/04/2017  . Abdominal pain 12/05/2013  . Diarrhea 12/05/2013  . Hypokalemia 12/05/2013  . Chest pain- atypical. Normal LHC 03/2013 03/10/2013  . HTN (hypertension) 03/10/2013  . Nonspecific abnormal electrocardiogram (ECG) (EKG) 03/10/2013    Past Surgical History:  Procedure Laterality Date  . ATRIAL FIBRILLATION ABLATION    . BIOPSY  08/27/2018   Procedure: BIOPSY;  Surgeon: Thornton Park, MD;  Location: [facility];  Service: Gastroenterology;;  . BIOPSY  08/28/2018   Procedure: BIOPSY;  Surgeon: Thornton Park, MD;  Location: [facility];  Service: Gastroenterology;;  . COLONOSCOPY WITH PROPOFOL N/A 08/28/2018   Procedure: COLONOSCOPY WITH PROPOFOL;  Surgeon: Thornton Park, MD;  Location: [place];  Service: Gastroenterology;  Laterality: N/A;  . ENTEROSCOPY N/A 08/27/2018   Procedure: ENTEROSCOPY;  Surgeon: Thornton Park, MD;  Location: [place];  Service: Gastroenterology;  Laterality: N/A;  . GIVENS CAPSULE STUDY N/A 08/28/2018   Procedure: GIVENS CAPSULE STUDY;  Surgeon: Thornton Park, MD;  Location: [place];  Service: Gastroenterology;  Laterality: N/A;  . LEFT HEART CATHETERIZATION WITH CORONARY ANGIOGRAM N/A 03/11/2013   Procedure: LEFT HEART CATHETERIZATION WITH CORONARY ANGIOGRAM;  Surgeon: Jolaine Artist, MD;  Location: [facility] CATH LAB;  Service: Cardiovascular;  Laterality: N/A;  . POLYPECTOMY  08/28/2018   Procedure: POLYPECTOMY;  Surgeon: Thornton Park, MD;  Location: [facility];  Service: Gastroenterology;;  . TONSILLECTOMY    . TRANSURETHRAL RESECTION OF PROSTATE          Home Medications    Prior to Admission medications   Medication Sig Start Date End Date Taking? Authorizing Provider  amLODipine (NORVASC) 10 MG tablet Take 1 tablet (10 mg total) by mouth daily. 02/07/15   Charlesetta Shanks, MD  cetirizine (ZYRTEC) 10 MG tablet Take 10 mg by mouth daily.    [provider]  furosemide (LASIX) 20 MG tablet Take 20 mg by mouth daily. 04/12/18   [provider]  gabapentin (NEURONTIN) 300 MG capsule Take 300 mg by mouth at bedtime.    [provider]  linaclotide Rolan Lipa) 290 MCG CAPS capsule Take 1 capsule (290 mcg total) by mouth daily before breakfast. 08/30/18   Hongalgi, Lenis Dickinson, MD  Multiple Vitamins-Minerals (MULTIVITAMIN WITH MINERALS) tablet Take 1 tablet by mouth daily.    [provider]  nitroGLYCERIN (NITROSTAT) 0.4 MG SL tablet Place 1 tablet (0.4 mg total) under the tongue every 5 (five) minutes as needed for chest pain. 03/12/13   Thurnell Lose, MD  oxyCODONE (OXY IR/ROXICODONE) 5 MG immediate release tablet Take 1 tablet (5 mg total) by mouth every 6 (six) hours as needed for moderate pain or severe pain. 03/12/13   Thurnell Lose, MD  pantoprazole (PROTONIX) 40 MG tablet Take 40 mg by mouth daily. 07/28/14   [provider]  polyethylene glycol (MIRALAX / GLYCOLAX) 17 g packet Take 17 g by mouth 2 (two) times daily. 08/29/18   Hongalgi, Lenis Dickinson, MD  sotalol (BETAPACE) 80 MG tablet  Take 40 mg by mouth 2 (two) times daily.    [provider]  SUMAtriptan (IMITREX) 50 MG tablet Take 50-100 mg by mouth once as needed for migraine. 05/03/16   [provider]  tamsulosin (FLOMAX) 0.4 MG CAPS capsule Take 0.4 mg by mouth daily.    [provider]  tiotropium (SPIRIVA) 18 MCG inhalation capsule Place 18 mcg into inhaler and inhale daily.    [provider]  XARELTO 20 MG TABS tablet Take 20 mg by mouth daily. 05/03/18   [provider]    Family History Family History  Problem Relation Age of Onset  . CAD Mother        died in her 28's - MI  . Multiple myeloma Brother   . Sickle cell anemia Father        died @ 32  . Gastric cancer Neg Hx     Social History Social History   Tobacco Use  . Smoking status: Never Smoker  . Smokeless tobacco: Never Used  Substance Use Topics  . Alcohol use: No  . Drug use: Yes    Types: Marijuana    Comment: reports smoking CBD     Allergies   Patient has no known allergies.   Review of Systems Review of Systems  Constitutional: Negative for fever.  Gastrointestinal: Positive for hematochezia. Negative for abdominal pain.  Neurological: Negative for dizziness, loss of consciousness and light-headedness.  All other systems reviewed and are negative.    Physical Exam Updated Vital Signs BP (!) 167/107 (BP Location: Left Arm)   Pulse 76   Temp 98.5 F (36.9 C) (Oral)   Resp 16   Ht 6' (1.829 m)   Wt 99.8 kg   SpO2 99%   BMI 29.84 kg/m   Physical Exam Vitals signs and nursing note reviewed.  Constitutional:      General: He is not in acute distress.    Appearance: He is well-developed.  HENT:     Head: Normocephalic and atraumatic.     Mouth/Throat:     Mouth: Mucous membranes are moist.  Eyes:     Conjunctiva/sclera: Conjunctivae normal.     Pupils: Pupils are equal, round, and reactive to light.  Neck:     Musculoskeletal: Normal range of motion and neck  supple.  Cardiovascular:     Rate and Rhythm: Normal rate and regular rhythm.     Heart sounds: No murmur.  Pulmonary:     Effort: Pulmonary effort is normal. No respiratory distress.     Breath sounds: Normal breath sounds. No wheezing or rales.  Abdominal:     General: There is no distension.     Palpations: Abdomen is soft.     Tenderness: There is no guarding or rebound.     Comments: Mild diffuse tenderness with no rebound or guarding  Genitourinary:    Rectum: Normal.     Comments: Scant stool present but small amount that was retrieved is black Musculoskeletal: Normal range of motion.        General: No tenderness.  Skin:    General: Skin is warm and dry.     Capillary Refill: Capillary refill takes less than 2 seconds.     Findings: No erythema or rash.  Neurological:     Mental Status: He is alert and oriented to person, place, and time. Mental status is at baseline.  Psychiatric:        Mood and Affect: Mood normal.        Behavior: Behavior normal.        Thought Content: Thought content normal.      ED Treatments / Results  Labs (all labs ordered are listed, but only abnormal results are displayed) Labs Reviewed  CBC WITH DIFFERENTIAL/PLATELET - Abnormal; Notable for the following components:      Result Value   Hemoglobin 11.9 (*)    HCT 38.7 (*)    MCV 75.4 (*)    MCH 23.2 (*)    RDW 19.4 (*)    All other components within normal limits  COMPREHENSIVE METABOLIC PANEL - Abnormal; Notable for the following components:   Potassium 3.2 (*)    Creatinine, Ser 1.54 (*)    Total Protein 8.5 (*)    GFR calc non Af Amer 49 (*)    GFR calc Af Amer 56 (*)    All other components within normal limits  OCCULT BLOOD X 1 CARD TO LAB, STOOL - Abnormal; Notable for the following components:   Fecal Occult  Bld POSITIVE (*)    All other components within normal limits    EKG None  Radiology No results found.  Procedures Procedures (including critical care  time)  Medications Ordered in ED Medications - No data to display   Initial Impression / Assessment and Plan / ED Course  I have reviewed the triage vital signs and the nursing notes.  Pertinent labs & imaging results that were available during my care of the patient were reviewed by me and considered in my medical decision making (see chart for details).       58 year old male presenting with black stools and persistent general fatigue.  Recently admitted for upper GI bleed without specific source.  Was found to have some duodenitis and is on a PPI but no other signs for the bleed.  Patient is on Xarelto for paroxysmal atrial fibrillation and also recently started iron.  Patient's stool today was scant but black.  Will ensure there is no new bleeding.  Also will recheck hemoglobin to ensure no worsening anemia.  Patient is currently hemodynamically stable satting 99% on room air.  He denies any near-syncope or shortness of breath at this time.  2:41 PM Hemoglobin has improved to 11.9 today he is still Hemoccult positive but denies tarry schools and states that they are formed even though they are dark.  Some of the blackness may be related to the recent iron he started.  Findings discussed with the patient and feel it is reasonable to discharge patient home and his his blood pressure is stable.  He will continue Xarelto at this time but he does have close follow-up with his PCP and [facility]  Final Clinical Impressions(s) / ED Diagnoses   Final diagnoses:  Gastrointestinal hemorrhage with melena    ED Discharge Orders    None       Blanchie Dessert, MD 09/05/18 1442

## 2018-09-05 NOTE — ED Notes (Addendum)
Pt verbalizes tarry stools and  mild abdominal cramping.

## 2018-09-05 NOTE — ED Notes (Signed)
ED Provider at bedside. 

## 2018-09-05 NOTE — Discharge Instructions (Signed)
Your hemoglobin today has improved to 11.9 from 8.3.  When your stool was checked there is still some blood in it however the blackness of your stool may be related to the medication you are taking.  If you start developing shortness of breath, leg pain, dizziness or feeling like you are going to pass out you should return for repeat evaluation and repeat check of your blood counts.

## 2018-09-05 NOTE — ED Triage Notes (Signed)
Pt reports dark tarry stools x 3 days. Was recently admitted to [facility] for same.

## 2018-09-05 NOTE — Telephone Encounter (Signed)
Father recently in hospital forGI bleed, no source of GI bleed found on colonoscopy, small bowel enteroscopy, pt having increased loose black stool, feels weak, c/o SOB, intermittent CP, no CP at this time. Advised pt to proceed to the ED

## 2018-09-07 ENCOUNTER — Telehealth: Payer: Self-pay | Admitting: Nurse Practitioner

## 2018-09-07 NOTE — Telephone Encounter (Signed)
-----   Message from Thornton Park, MD sent at 09/07/2018  8:18 AM EDT ----- Regarding: RE: Office appointment this week This patient is followed by [name] in [facility] as an outpatient. He was last seen in the [facility] office 08/17/18. He was going to follow-up there after hospital discharge. I think this would be his best plan as he is an active patient there. Jaclyn Shaggy said that she would notify the doctor of this recommendation.  Thanks.  ----- Message ----- From: Noralyn Pick, NP Sent: 09/05/2018   3:09 PM EDT To: Marlon Pel, RN, Thornton Park, MD Subject: Office appointment this week                   Hi Dr. Tarri Glenn and Barbera Setters, I received call from R. Vejar daughter on Saturday. She called reporting her father was having more black stool,  +/- loose and solid, on po iron,  with worsening fatigue today with SOB. Recently admitted with GI bleed without specific source found. He had an enteroscopy, colonoscopy and capsule evaluation with no specific location for the bleed. Due to ? Recurrence of melena with worsening fatigue, I advised he go to the local ED to get a CBC. His hg was improved at 11.9 today, prior Hg was 8.4. He was discharged home in stable condition. Can you please check on him on Monday and schedule an ov for follow up. Thank you. Larissa Pegg.

## 2018-09-07 NOTE — Telephone Encounter (Signed)
Pt's daughter, Mardene Celeste, verified she received my earlier msg. She will call  [name] in [place] for GI follow up for her father. See phone notes over this past weekend.

## 2018-10-02 ENCOUNTER — Ambulatory Visit: Payer: Medicare Other | Admitting: Gastroenterology

## 2018-10-02 DIAGNOSIS — K921 Melena: Secondary | ICD-10-CM | POA: Insufficient documentation

## 2019-01-01 ENCOUNTER — Telehealth: Payer: Self-pay | Admitting: Gastroenterology

## 2019-01-01 NOTE — Telephone Encounter (Signed)
I would be happy to see him in follow-up as an outpatient. Thank you.

## 2019-01-01 NOTE — Telephone Encounter (Signed)
Dr. Tarri Glenn, pt's daughter requested for pt to transfer GI care to you because you performed a hospital colon on him in May 2020.  Daughter reported that pt is having the same issues of tarry stool, BRBPR, suspected GI bleed.  Current pt at [facility] but pt is not satisfied as symptoms are worsening.  Daughter requested to "transfer to a doctor who cares."  Records are in Epic for review.  Please advise.

## 2019-02-06 IMAGING — DX DG CHEST 2V
2 series · 2 of 2 positions shown · non-contrast
Comparison: 01/11/2017

CLINICAL DATA: Productive cough, fever for 1 week

EXAM:
CHEST  2 VIEW

[chest pa]
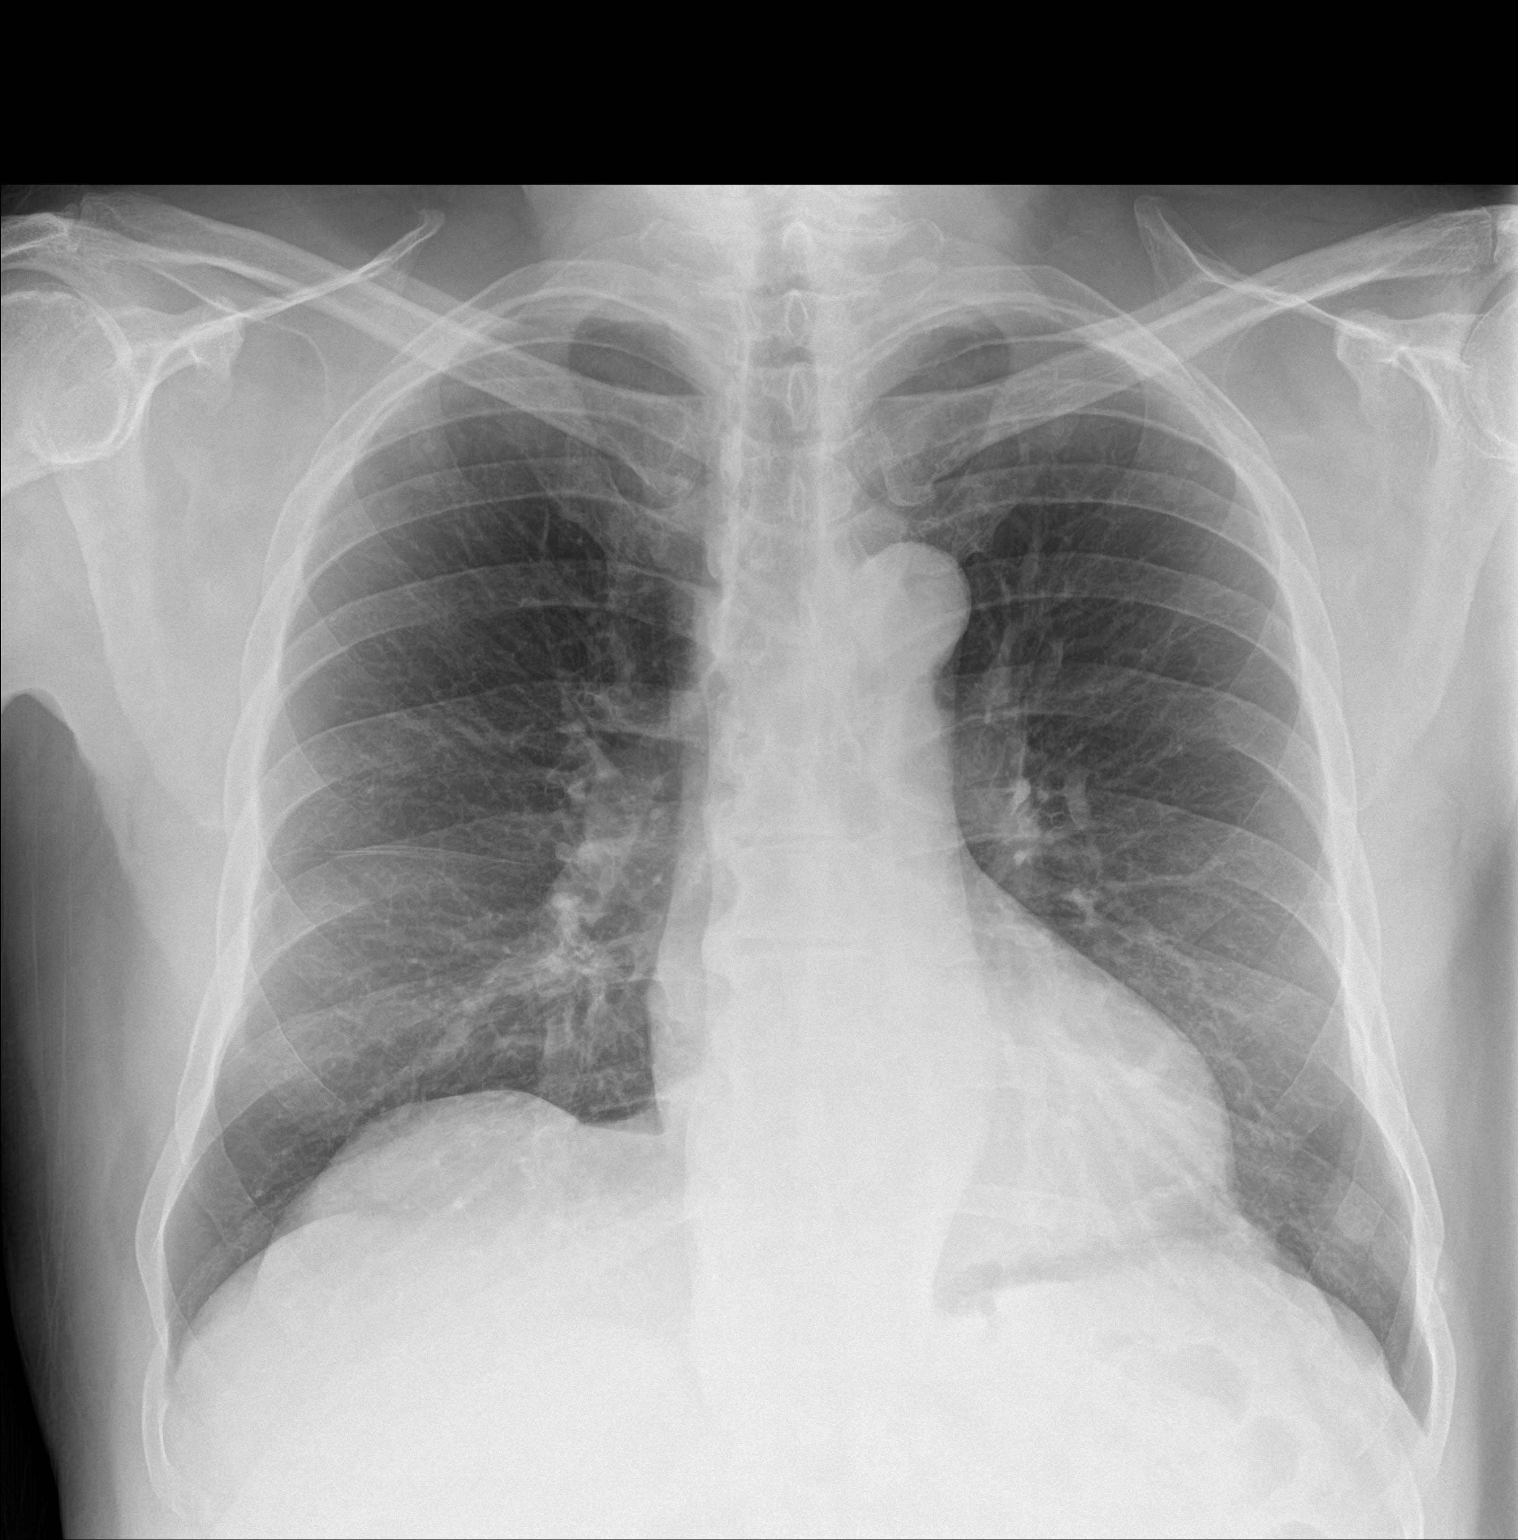

[chest lat]
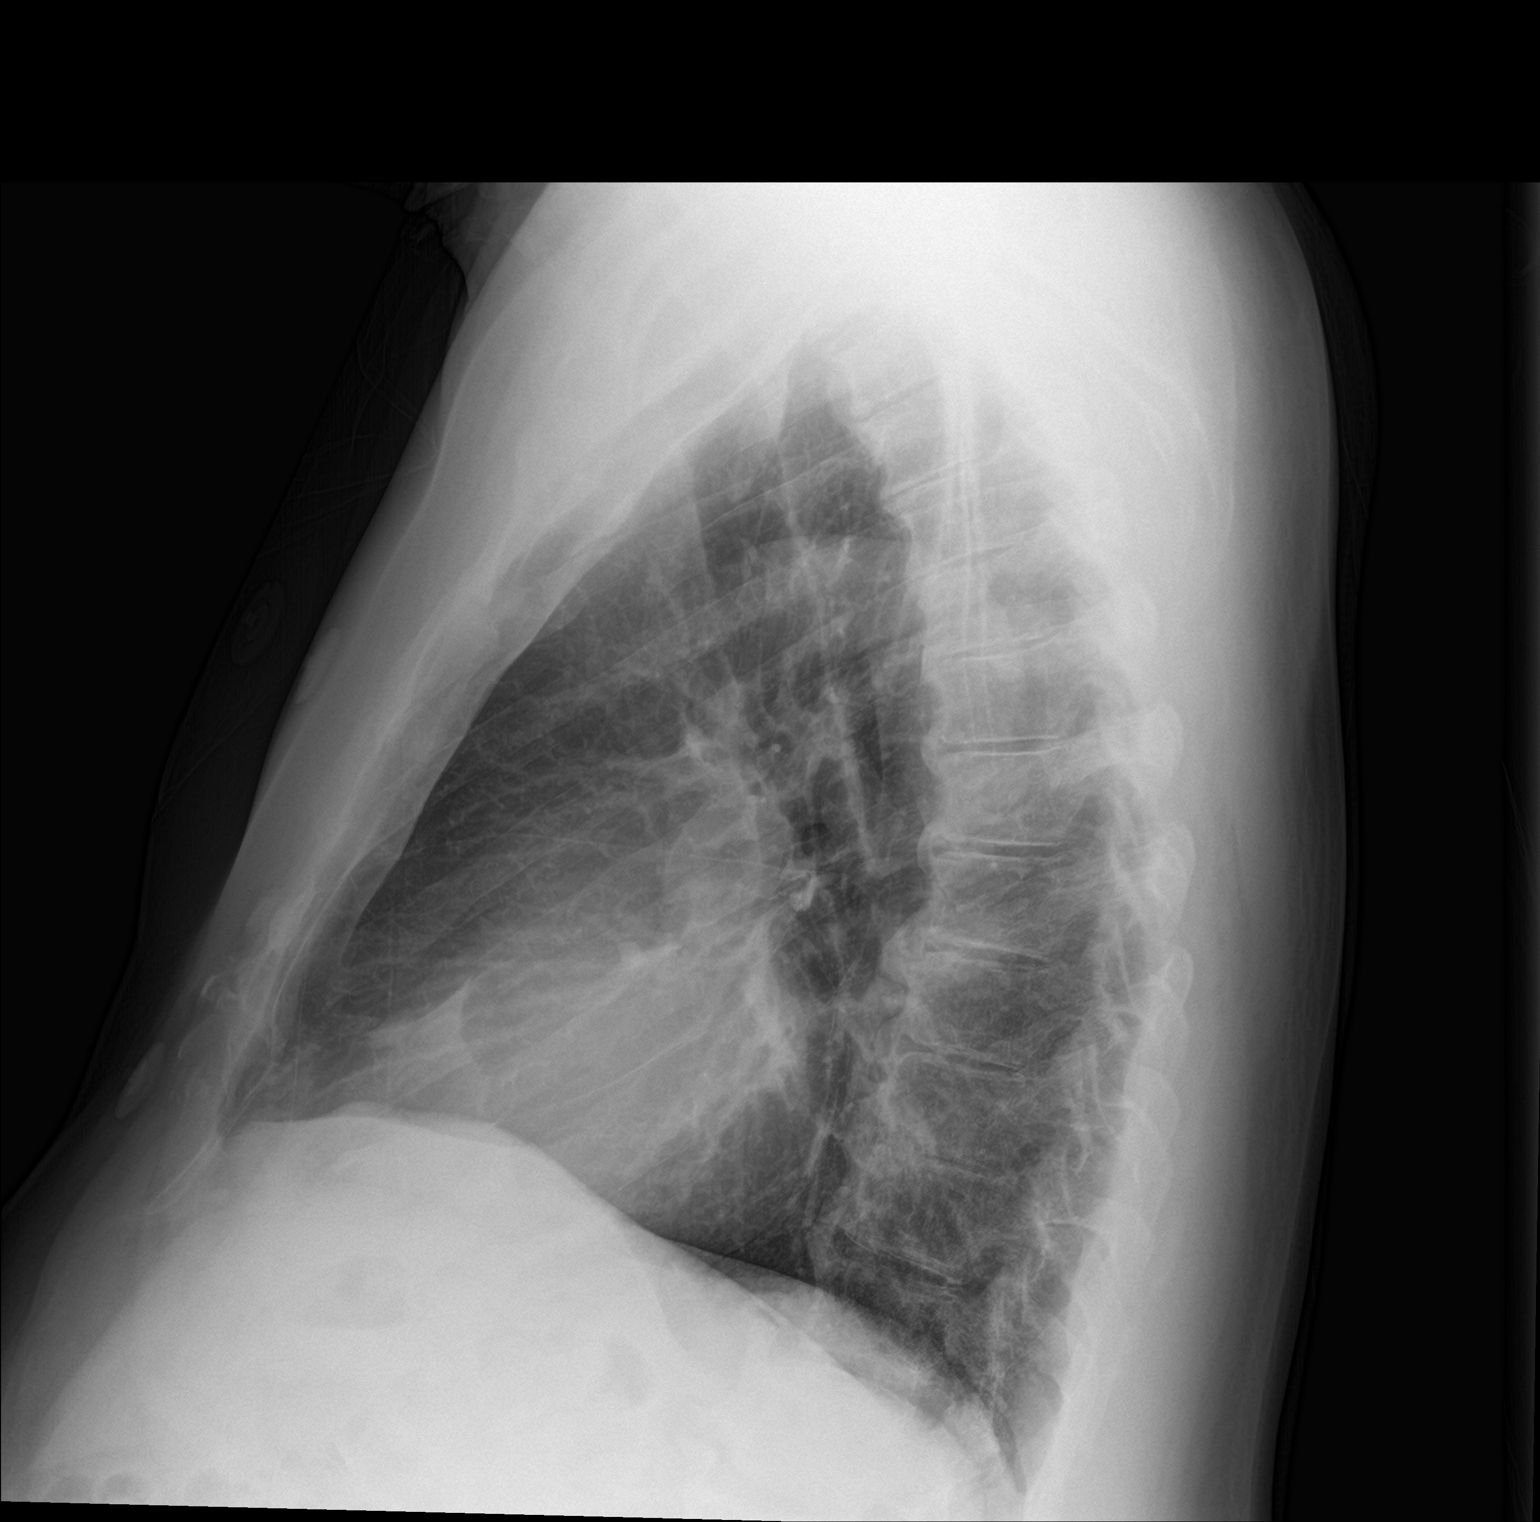

[2 of 2 positions shown; findings below may reference images not displayed]

FINDINGS: The heart size and mediastinal contours are within normal limits.
Both lungs are clear. The visualized skeletal structures are
unremarkable.
IMPRESSION: No active cardiopulmonary disease.

## 2019-02-26 ENCOUNTER — Emergency Department (HOSPITAL_BASED_OUTPATIENT_CLINIC_OR_DEPARTMENT_OTHER): Payer: Medicare Other

## 2019-02-26 ENCOUNTER — Other Ambulatory Visit: Payer: Self-pay

## 2019-02-26 ENCOUNTER — Emergency Department (HOSPITAL_BASED_OUTPATIENT_CLINIC_OR_DEPARTMENT_OTHER)
Admission: EM | Admit: 2019-02-26 | Discharge: 2019-02-26 | Disposition: A | Discharge status: Home / Self Care | Payer: Medicare Other | Attending: Emergency Medicine | Admitting: Emergency Medicine

## 2019-02-26 ENCOUNTER — Encounter (HOSPITAL_BASED_OUTPATIENT_CLINIC_OR_DEPARTMENT_OTHER): Payer: Self-pay | Admitting: *Deleted

## 2019-02-26 DIAGNOSIS — J449 Chronic obstructive pulmonary disease, unspecified: Secondary | ICD-10-CM | POA: Insufficient documentation

## 2019-02-26 DIAGNOSIS — I10 Essential (primary) hypertension: Secondary | ICD-10-CM | POA: Insufficient documentation

## 2019-02-26 DIAGNOSIS — R1032 Left lower quadrant pain: Secondary | ICD-10-CM | POA: Diagnosis present

## 2019-02-26 DIAGNOSIS — Z79899 Other long term (current) drug therapy: Secondary | ICD-10-CM | POA: Insufficient documentation

## 2019-02-26 DIAGNOSIS — Z9861 Coronary angioplasty status: Secondary | ICD-10-CM | POA: Diagnosis not present

## 2019-02-26 DIAGNOSIS — K5732 Diverticulitis of large intestine without perforation or abscess without bleeding: Secondary | ICD-10-CM | POA: Diagnosis not present

## 2019-02-26 DIAGNOSIS — Z7901 Long term (current) use of anticoagulants: Secondary | ICD-10-CM | POA: Insufficient documentation

## 2019-02-26 LAB — BASIC METABOLIC PANEL
Anion gap: 10 (ref 5–15)
BUN: 18 mg/dL (ref 6–20)
CO2: 25 mmol/L (ref 22–32)
Calcium: 9.4 mg/dL (ref 8.9–10.3)
Chloride: 103 mmol/L (ref 98–111)
Creatinine, Ser: 1.32 mg/dL — ABNORMAL HIGH (ref 0.61–1.24)
GFR calc Af Amer: >60 mL/min (ref >60)
GFR calc non Af Amer: 59 mL/min — ABNORMAL LOW (ref >60)
Glucose, Bld: 108 mg/dL — ABNORMAL HIGH (ref 70–99)
Potassium: 3.4 mmol/L — ABNORMAL LOW (ref 3.5–5.1)
Sodium: 138 mmol/L (ref 135–145)

## 2019-02-26 LAB — CBC WITH DIFFERENTIAL/PLATELET
Abs Immature Granulocytes: 0.03 10*3/uL (ref 0.00–0.07)
Basophils Absolute: 0.1 10*3/uL (ref 0.0–0.1)
Basophils Relative: 1 %
Eosinophils Absolute: 0.5 10*3/uL (ref 0.0–0.5)
Eosinophils Relative: 5 %
HCT: 42.1 % (ref 39.0–52.0)
Hemoglobin: 12.7 g/dL — ABNORMAL LOW (ref 13.0–17.0)
Immature Granulocytes: 0 %
Lymphocytes Relative: 27 %
Lymphs Abs: 2.4 10*3/uL (ref 0.7–4.0)
MCH: 21.8 pg — ABNORMAL LOW (ref 26.0–34.0)
MCHC: 30.2 g/dL (ref 30.0–36.0)
MCV: 72.2 fL — ABNORMAL LOW (ref 80.0–100.0)
Monocytes Absolute: 0.9 10*3/uL (ref 0.1–1.0)
Monocytes Relative: 10 %
Neutro Abs: 5.1 10*3/uL (ref 1.7–7.7)
Neutrophils Relative %: 57 %
Platelets: 329 10*3/uL (ref 150–400)
RBC: 5.83 MIL/uL — ABNORMAL HIGH (ref 4.22–5.81)
RDW: 17.7 % — ABNORMAL HIGH (ref 11.5–15.5)
WBC: 9 10*3/uL (ref 4.0–10.5)
nRBC: 0 % (ref 0.0–0.2)

## 2019-02-26 LAB — URINALYSIS, MICROSCOPIC (REFLEX): Bacteria, UA: NONE SEEN

## 2019-02-26 LAB — URINALYSIS, ROUTINE W REFLEX MICROSCOPIC
Bilirubin Urine: NEGATIVE
Glucose, UA: NEGATIVE mg/dL
Ketones, ur: NEGATIVE mg/dL
Leukocytes,Ua: NEGATIVE
Nitrite: NEGATIVE
Protein, ur: NEGATIVE mg/dL
Specific Gravity, Urine: 1.025 (ref 1.005–1.030)
pH: 6 (ref 5.0–8.0)

## 2019-02-26 LAB — OCCULT BLOOD X 1 CARD TO LAB, STOOL: Fecal Occult Bld: NEGATIVE

## 2019-02-26 MED ORDER — METRONIDAZOLE IN NACL 5-0.79 MG/ML-% IV SOLN
500.0000 mg | Freq: Once | INTRAVENOUS | Status: AC
  Administered 2019-02-26: 500 mg via INTRAVENOUS
  Filled 2019-02-26: qty 100

## 2019-02-26 MED ORDER — CIPROFLOXACIN HCL 500 MG PO TABS: 500.0000 mg | ORAL_TABLET | Freq: Two times a day (BID) | ORAL | 0 refills | Status: DC

## 2019-02-26 MED ORDER — CIPROFLOXACIN HCL 500 MG PO TABS
500.0000 mg | ORAL_TABLET | Freq: Once | ORAL | Status: AC
  Administered 2019-02-26: 500 mg via ORAL
  Filled 2019-02-26: qty 1

## 2019-02-26 MED ORDER — OXYCODONE-ACETAMINOPHEN 5-325 MG PO TABS: 1.0000 | ORAL_TABLET | ORAL | 0 refills | Status: DC | PRN

## 2019-02-26 MED ORDER — FENTANYL CITRATE (PF) 100 MCG/2ML IJ SOLN
100.0000 ug | Freq: Once | INTRAMUSCULAR | Status: AC
  Administered 2019-02-26: 100 ug via INTRAVENOUS
  Filled 2019-02-26: qty 2

## 2019-02-26 MED ORDER — METRONIDAZOLE 500 MG PO TABS: 500.0000 mg | ORAL_TABLET | Freq: Three times a day (TID) | ORAL | 0 refills | Status: DC

## 2019-02-26 MED ORDER — ONDANSETRON HCL 4 MG/2ML IJ SOLN
4.0000 mg | Freq: Once | INTRAMUSCULAR | Status: AC
  Administered 2019-02-26: 4 mg via INTRAVENOUS
  Filled 2019-02-26: qty 2

## 2019-02-26 MED ORDER — IOHEXOL 300 MG/ML  SOLN
100.0000 mL | Freq: Once | INTRAMUSCULAR | Status: AC | PRN
  Administered 2019-02-26: 100 mL via INTRAVENOUS

## 2019-02-26 NOTE — ED Notes (Signed)
Patient transported to CT 

## 2019-02-26 NOTE — ED Provider Notes (Signed)
[facility] Provider Note: Georgena Spurling, MD, FACEP  CSN: [id number] MRN: [id number] ARRIVAL: 02/26/19 at [place]: [place]  Abdominal Pain   HISTORY OF PRESENT ILLNESS  02/26/19 1:58 AM DONTARIUS SHELEY is a 58 y.o. 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  He has been followed for this at [facility].  He is here with a 2-day history of intermittent abdominal pain.  The pain is located in the left lower quadrant with some radiation medially.  He describes it as burning and rates the pain as an 8 out of 10.  It is worse with movement or palpation.  He has had nausea but no vomiting or diarrhea.  He has not noticed black or bloody stools.  He denies urinary changes or fever.   Past Medical History:  Diagnosis Date  . Chest pain    a. 11/2002 Cath: EF 68%, LM nl, LAD small, nl, LCX nl, RCA tortuous/nl;  b. 08/2011 St Echo: EF 60%, normal contractility, no scar/ischemia.  Marland Kitchen COPD (chronic obstructive pulmonary disease) ([place])   . Enlarged prostate   . GERD (gastroesophageal reflux disease)   . GI bleeding 08/2018  . Hypertension   . Sickle cell trait ([place])   . Sleep apnea   . SVT (supraventricular tachycardia) ([facility])     Past Surgical History:  Procedure Laterality Date  . ATRIAL FIBRILLATION ABLATION    . BIOPSY  08/27/2018   Procedure: BIOPSY;  Surgeon: Thornton Park, MD;  Location: [facility];  Service: Gastroenterology;;  . BIOPSY  08/28/2018   Procedure: BIOPSY;  Surgeon: Thornton Park, MD;  Location: [facility];  Service: Gastroenterology;;  . COLONOSCOPY WITH PROPOFOL N/A 08/28/2018   Procedure: COLONOSCOPY WITH PROPOFOL;  Surgeon: Thornton Park, MD;  Location: [facility];  Service: Gastroenterology;  Laterality: N/A;  . ENTEROSCOPY N/A 08/27/2018   Procedure: ENTEROSCOPY;  Surgeon: Thornton Park, MD;  Location: [facility];  Service: Gastroenterology;  Laterality: N/A;   . GIVENS CAPSULE STUDY N/A 08/28/2018   Procedure: GIVENS CAPSULE STUDY;  Surgeon: Thornton Park, MD;  Location: [facility];  Service: Gastroenterology;  Laterality: N/A;  . LEFT HEART CATHETERIZATION WITH CORONARY ANGIOGRAM N/A 03/11/2013   Procedure: LEFT HEART CATHETERIZATION WITH CORONARY ANGIOGRAM;  Surgeon: Jolaine Artist, MD;  Location: [facility] CATH LAB;  Service: Cardiovascular;  Laterality: N/A;  . POLYPECTOMY  08/28/2018   Procedure: POLYPECTOMY;  Surgeon: Thornton Park, MD;  Location: [place] [facility];  Service: Gastroenterology;;  . TONSILLECTOMY    . TRANSURETHRAL RESECTION OF PROSTATE      Family History  Problem Relation Age of Onset  . CAD Mother        died in her 59's - MI  . Multiple myeloma Brother   . Sickle cell anemia Father        died @ 8  . Gastric cancer Neg Hx     Social History   Tobacco Use  . Smoking status: Never Smoker  . Smokeless tobacco: Never Used  Substance Use Topics  . Alcohol use: No  . Drug use: Yes    Types: Marijuana    Comment: reports smoking CBD    Prior to Admission medications   Medication Sig Start Date End Date Taking? Authorizing Provider  sildenafil (REVATIO) 20 MG tablet TAKE 3-5 TABLETS BY MOUTH AS DIRECTED 11/25/18  Yes [provider]  amLODipine (NORVASC) 10 MG tablet Take 1 tablet (10 mg total) by  mouth daily. 02/07/15   Charlesetta Shanks, MD  carvedilol (COREG) 3.125 MG tablet  09/12/18   [provider]  cetirizine (ZYRTEC) 10 MG tablet Take 10 mg by mouth daily.    [provider]  famotidine (PEPCID) 40 MG tablet  09/17/18   [provider]  furosemide (LASIX) 20 MG tablet Take 20 mg by mouth daily. 04/12/18   [provider]  gabapentin (NEURONTIN) 300 MG capsule Take 300 mg by mouth at bedtime.    [provider]  linaclotide Rolan Lipa) 290 MCG CAPS capsule Take 1 capsule (290 mcg total) by mouth daily before breakfast. 08/30/18   Hongalgi, Lenis Dickinson, MD  losartan  (COZAAR) 25 MG tablet  09/12/18   [provider]  Multiple Vitamins-Minerals (MULTIVITAMIN WITH MINERALS) tablet Take 1 tablet by mouth daily.    [provider]  nitroGLYCERIN (NITROSTAT) 0.4 MG SL tablet Place 1 tablet (0.4 mg total) under the tongue every 5 (five) minutes as needed for chest pain. 03/12/13   Thurnell Lose, MD  ondansetron (ZOFRAN) 8 MG tablet Take 8 mg by mouth every 8 (eight) hours as needed. 10/20/18   [provider]  oxyCODONE (OXY IR/ROXICODONE) 5 MG immediate release tablet Take 1 tablet (5 mg total) by mouth every 6 (six) hours as needed for moderate pain or severe pain. 03/12/13   Thurnell Lose, MD  pantoprazole (PROTONIX) 40 MG tablet Take 40 mg by mouth daily. 07/28/14   [provider]  polyethylene glycol (MIRALAX / GLYCOLAX) 17 g packet Take 17 g by mouth 2 (two) times daily. 08/29/18   Hongalgi, Lenis Dickinson, MD  sotalol (BETAPACE) 80 MG tablet Take 40 mg by mouth 2 (two) times daily.    [provider]  SUMAtriptan (IMITREX) 50 MG tablet Take 50-100 mg by mouth once as needed for migraine. 05/03/16   [provider]  tamsulosin (FLOMAX) 0.4 MG CAPS capsule Take 0.4 mg by mouth daily.    [provider]  tiotropium (SPIRIVA) 18 MCG inhalation capsule Place 18 mcg into inhaler and inhale daily.    [provider]  triamterene-hydrochlorothiazide (DYAZIDE) 37.5-25 MG capsule Take 1 capsule by mouth every morning. 01/27/19   [provider]  XARELTO 20 MG TABS tablet Take 20 mg by mouth daily. 05/03/18   [provider]    Allergies Patient has no known allergies.   REVIEW OF SYSTEMS  Negative except as noted here or in the History of Present Illness.   PHYSICAL EXAMINATION  Initial Vital Signs Blood pressure (!) 155/105, pulse 71, temperature 98.2 F (36.8 C), temperature source Oral, resp. rate 18, SpO2 99 %.  Examination General: Well-developed, well-nourished male in no  acute distress; appearance consistent with age of record HENT: normocephalic; atraumatic Eyes: pupils equal, round and reactive to light; extraocular muscles intact Neck: supple Heart: regular rate and rhythm Lungs: clear to auscultation bilaterally Abdomen: soft; nondistended; left lower quadrant tenderness; bowel sounds present Rectal: Normal sphincter tone; no formed stool in vault Extremities: No deformity; full range of motion; pulses normal Neurologic: Awake, alert and oriented; motor function intact in all extremities and symmetric; no facial droop Skin: Warm and dry Psychiatric: Flat affect   RESULTS  Summary of this visit's results, reviewed and interpreted by myself:   EKG Interpretation  Date/Time:    Ventricular Rate:    PR Interval:    QRS Duration:   QT Interval:    QTC Calculation:   R Axis:  Text Interpretation:        Laboratory Studies: Results for orders placed or performed during the hospital encounter of 02/26/19 (from the past 24 hour(s))  CBC with Differential     Status: Abnormal   Collection Time: 02/26/19  2:05 AM  Result Value Ref Range   WBC 9.0 4.0 - 10.5 K/uL   RBC 5.83 (H) 4.22 - 5.81 MIL/uL   Hemoglobin 12.7 (L) 13.0 - 17.0 g/dL   HCT 42.1 39.0 - 52.0 %   MCV 72.2 (L) 80.0 - 100.0 fL   MCH 21.8 (L) 26.0 - 34.0 pg   MCHC 30.2 30.0 - 36.0 g/dL   RDW 17.7 (H) 11.5 - 15.5 %   Platelets 329 150 - 400 K/uL   nRBC 0.0 0.0 - 0.2 %   Neutrophils Relative % 57 %   Neutro Abs 5.1 1.7 - 7.7 K/uL   Lymphocytes Relative 27 %   Lymphs Abs 2.4 0.7 - 4.0 K/uL   Monocytes Relative 10 %   Monocytes Absolute 0.9 0.1 - 1.0 K/uL   Eosinophils Relative 5 %   Eosinophils Absolute 0.5 0.0 - 0.5 K/uL   Basophils Relative 1 %   Basophils Absolute 0.1 0.0 - 0.1 K/uL   Immature Granulocytes 0 %   Abs Immature Granulocytes 0.03 0.00 - 0.07 K/uL  Basic metabolic panel     Status: Abnormal   Collection Time: 02/26/19  2:05 AM  Result Value Ref Range    Sodium 138 135 - 145 mmol/L   Potassium 3.4 (L) 3.5 - 5.1 mmol/L   Chloride 103 98 - 111 mmol/L   CO2 25 22 - 32 mmol/L   Glucose, Bld 108 (H) 70 - 99 mg/dL   BUN 18 6 - 20 mg/dL   Creatinine, Ser 1.32 (H) 0.61 - 1.24 mg/dL   Calcium 9.4 8.9 - 10.3 mg/dL   GFR calc non Af Amer 59 (L) >60 mL/min   GFR calc Af Amer >60 >60 mL/min   Anion gap 10 5 - 15  Urinalysis, Routine w reflex microscopic     Status: Abnormal   Collection Time: 02/26/19  2:05 AM  Result Value Ref Range   Color, Urine YELLOW YELLOW   APPearance CLEAR CLEAR   Specific Gravity, Urine 1.025 1.005 - 1.030   pH 6.0 5.0 - 8.0   Glucose, UA NEGATIVE NEGATIVE mg/dL   Hgb urine dipstick SMALL (A) NEGATIVE   Bilirubin Urine NEGATIVE NEGATIVE   Ketones, ur NEGATIVE NEGATIVE mg/dL   Protein, ur NEGATIVE NEGATIVE mg/dL   Nitrite NEGATIVE NEGATIVE   Leukocytes,Ua NEGATIVE NEGATIVE  Urinalysis, Microscopic (reflex)     Status: None   Collection Time: 02/26/19  2:05 AM  Result Value Ref Range   RBC / HPF 0-5 0 - 5 RBC/hpf   WBC, UA 0-5 0 - 5 WBC/hpf   Bacteria, UA NONE SEEN NONE SEEN   Squamous Epithelial / LPF 0-5 0 - 5  Occult blood card to lab, stool Provider will collect     Status: None   Collection Time: 02/26/19  2:06 AM  Result Value Ref Range   Fecal Occult Bld NEGATIVE NEGATIVE   Imaging Studies: Ct Abdomen Pelvis W Contrast  Result Date: 02/26/2019 CLINICAL DATA:  Lower abdominal pain. Nausea. Diverticulitis suspected. EXAM: CT ABDOMEN AND PELVIS WITH CONTRAST TECHNIQUE: Multidetector CT imaging of the abdomen and pelvis was performed using the standard protocol following bolus administration of intravenous contrast. CONTRAST:  135m OMNIPAQUE IOHEXOL 300 MG/ML  SOLN COMPARISON:  CT 08/13/2018 FINDINGS: Lower chest: The lung bases are clear. Hepatobiliary: Diffusely decreased hepatic density consistent with steatosis. No focal abnormality. Gallbladder physiologically distended, no calcified stone. No biliary  dilatation. Pancreas: No ductal dilatation or inflammation. Spleen: Normal in size without focal abnormality. Adrenals/Urinary Tract: Normal adrenal glands. No hydronephrosis or perinephric edema. Homogeneous renal enhancement with symmetric excretion on delayed phase imaging. Small cyst in the central left kidney. Urinary bladder is partially distended. Right aspect of the bladder dome extends into the right inguinal canal, unchanged from prior exam. No bladder wall thickening. Stomach/Bowel: Inflamed diverticulum at the junction of the descending and sigmoid colon consistent with acute diverticulitis. Mild adjacent pericolonic edema. No free air or abscess. Numerous additional noninflamed colonic diverticula, prominent in the descending and sigmoid colon. Normal appendix. No small bowel obstruction or wall thickening. Stomach is decompressed. Vascular/Lymphatic: Mild aortic atherosclerosis. No aneurysm. Portal vein is patent. No enlarged lymph nodes in the abdomen or pelvis. Reproductive: Prostate is unremarkable. Other: Within both inguinal canals. Right aspect of the urinary bladder extends into the right inguinal ring. No ascites, free air, or focal fluid collection. Tiny fat containing umbilical hernia. Musculoskeletal: Degenerative change in the lumbar spine with degenerative disc disease and prominent facet hypertrophy. Degenerative change of both hips. There are no acute or suspicious osseous abnormalities. IMPRESSION: 1. Acute uncomplicated diverticulitis at the junction of the descending and sigmoid colon. No perforation or abscess. 2. Hepatic steatosis. Aortic Atherosclerosis (ICD10-I70.0). Electronically Signed   By: Keith Rake M.D.   On: 02/26/2019 03:03    ED COURSE and MDM  Nursing notes, initial and subsequent vitals signs, including pulse oximetry, reviewed and interpreted by myself.  Vitals:   02/26/19 0151 02/26/19 0200  BP: (!) 155/105 (!) 148/109  Pulse: 71 73  Resp: 18   Temp:  98.2 F (36.8 C)   TempSrc: Oral   SpO2: 99% 100%   Medications  ciprofloxacin (CIPRO) tablet 500 mg (has no administration in time range)  metroNIDAZOLE (FLAGYL) IVPB 500 mg (has no administration in time range)  ondansetron (ZOFRAN) injection 4 mg (4 mg Intravenous Given 02/26/19 0209)  fentaNYL (SUBLIMAZE) injection 100 mcg (100 mcg Intravenous Given 02/26/19 0210)  iohexol (OMNIPAQUE) 300 MG/ML solution 100 mL (100 mLs Intravenous Contrast Given 02/26/19 0242)   3:14 AM We will treat for diverticulitis with Cipro and Flagyl.  CT findings show uncomplicated disease so outpatient treatment is indicated.  PROCEDURES  Procedures   ED DIAGNOSES     ICD-10-CM   1. Diverticulitis of large intestine without perforation or abscess without bleeding  K57.32        Viaan Knippenberg, Jenny Reichmann, MD 02/26/19 [phone]

## 2019-02-26 NOTE — ED Triage Notes (Addendum)
Pt states around 1830 last night, onset of lower abdominal pain, worse on the left than right, radiates around to the lower back. Nausea. No urinary pain or fevers. LBM this morning

## 2019-02-26 NOTE — ED Notes (Signed)
Pt also says that he has had some intermittent bright red blood in his stool and increased fatigue.

## 2019-03-04 ENCOUNTER — Emergency Department (HOSPITAL_BASED_OUTPATIENT_CLINIC_OR_DEPARTMENT_OTHER)
Admission: EM | Admit: 2019-03-04 | Discharge: 2019-03-04 | Disposition: A | Discharge status: Home / Self Care | Payer: Medicare Other | Attending: Emergency Medicine | Admitting: Emergency Medicine

## 2019-03-04 ENCOUNTER — Encounter (HOSPITAL_BASED_OUTPATIENT_CLINIC_OR_DEPARTMENT_OTHER): Payer: Self-pay | Admitting: Emergency Medicine

## 2019-03-04 ENCOUNTER — Other Ambulatory Visit: Payer: Self-pay

## 2019-03-04 ENCOUNTER — Emergency Department (HOSPITAL_BASED_OUTPATIENT_CLINIC_OR_DEPARTMENT_OTHER): Payer: Medicare Other

## 2019-03-04 DIAGNOSIS — Y92008 Other place in unspecified non-institutional (private) residence as the place of occurrence of the external cause: Secondary | ICD-10-CM | POA: Insufficient documentation

## 2019-03-04 DIAGNOSIS — Y9389 Activity, other specified: Secondary | ICD-10-CM | POA: Insufficient documentation

## 2019-03-04 DIAGNOSIS — I129 Hypertensive chronic kidney disease with stage 1 through stage 4 chronic kidney disease, or unspecified chronic kidney disease: Secondary | ICD-10-CM | POA: Diagnosis not present

## 2019-03-04 DIAGNOSIS — Z79899 Other long term (current) drug therapy: Secondary | ICD-10-CM | POA: Diagnosis not present

## 2019-03-04 DIAGNOSIS — M25511 Pain in right shoulder: Secondary | ICD-10-CM | POA: Diagnosis present

## 2019-03-04 DIAGNOSIS — S46911A Strain of unspecified muscle, fascia and tendon at shoulder and upper arm level, right arm, initial encounter: Secondary | ICD-10-CM

## 2019-03-04 DIAGNOSIS — N183 Chronic kidney disease, stage 3 unspecified: Secondary | ICD-10-CM | POA: Insufficient documentation

## 2019-03-04 DIAGNOSIS — J449 Chronic obstructive pulmonary disease, unspecified: Secondary | ICD-10-CM | POA: Insufficient documentation

## 2019-03-04 DIAGNOSIS — R42 Dizziness and giddiness: Secondary | ICD-10-CM | POA: Diagnosis not present

## 2019-03-04 DIAGNOSIS — X58XXXA Exposure to other specified factors, initial encounter: Secondary | ICD-10-CM | POA: Insufficient documentation

## 2019-03-04 DIAGNOSIS — Y999 Unspecified external cause status: Secondary | ICD-10-CM | POA: Insufficient documentation

## 2019-03-04 DIAGNOSIS — E876 Hypokalemia: Secondary | ICD-10-CM | POA: Diagnosis not present

## 2019-03-04 LAB — CBC WITH DIFFERENTIAL/PLATELET
Abs Immature Granulocytes: 0.03 10*3/uL (ref 0.00–0.07)
Basophils Absolute: 0 10*3/uL (ref 0.0–0.1)
Basophils Relative: 0 %
Eosinophils Absolute: 0.4 10*3/uL (ref 0.0–0.5)
Eosinophils Relative: 6 %
HCT: 38.8 % — ABNORMAL LOW (ref 39.0–52.0)
Hemoglobin: 11.9 g/dL — ABNORMAL LOW (ref 13.0–17.0)
Immature Granulocytes: 0 %
Lymphocytes Relative: 32 %
Lymphs Abs: 2.3 10*3/uL (ref 0.7–4.0)
MCH: 21.8 pg — ABNORMAL LOW (ref 26.0–34.0)
MCHC: 30.7 g/dL (ref 30.0–36.0)
MCV: 71.2 fL — ABNORMAL LOW (ref 80.0–100.0)
Monocytes Absolute: 0.9 10*3/uL (ref 0.1–1.0)
Monocytes Relative: 13 %
Neutro Abs: 3.4 10*3/uL (ref 1.7–7.7)
Neutrophils Relative %: 49 %
Platelets: 326 10*3/uL (ref 150–400)
RBC: 5.45 MIL/uL (ref 4.22–5.81)
RDW: 16.7 % — ABNORMAL HIGH (ref 11.5–15.5)
WBC: 7 10*3/uL (ref 4.0–10.5)
nRBC: 0 % (ref 0.0–0.2)

## 2019-03-04 LAB — BASIC METABOLIC PANEL
Anion gap: 7 (ref 5–15)
BUN: 17 mg/dL (ref 6–20)
CO2: 26 mmol/L (ref 22–32)
Calcium: 9.4 mg/dL (ref 8.9–10.3)
Chloride: 103 mmol/L (ref 98–111)
Creatinine, Ser: 1.74 mg/dL — ABNORMAL HIGH (ref 0.61–1.24)
GFR calc Af Amer: 49 mL/min — ABNORMAL LOW (ref >60)
GFR calc non Af Amer: 42 mL/min — ABNORMAL LOW (ref >60)
Glucose, Bld: 91 mg/dL (ref 70–99)
Potassium: 3.3 mmol/L — ABNORMAL LOW (ref 3.5–5.1)
Sodium: 136 mmol/L (ref 135–145)

## 2019-03-04 MED ORDER — HYDROMORPHONE HCL 1 MG/ML IJ SOLN
0.5000 mg | Freq: Once | INTRAMUSCULAR | Status: AC
  Administered 2019-03-04: 0.5 mg via INTRAVENOUS
  Filled 2019-03-04: qty 1

## 2019-03-04 MED ORDER — METHOCARBAMOL 500 MG PO TABS: 500.0000 mg | ORAL_TABLET | Freq: Three times a day (TID) | ORAL | 0 refills | Status: DC | PRN

## 2019-03-04 MED ORDER — DIAZEPAM 5 MG PO TABS
5.0000 mg | ORAL_TABLET | Freq: Once | ORAL | Status: AC
  Administered 2019-03-04: 5 mg via ORAL
  Filled 2019-03-04: qty 1

## 2019-03-04 MED ORDER — POTASSIUM CHLORIDE CRYS ER 20 MEQ PO TBCR
20.0000 meq | EXTENDED_RELEASE_TABLET | Freq: Once | ORAL | Status: AC
  Administered 2019-03-04: 20 meq via ORAL
  Filled 2019-03-04: qty 1

## 2019-03-04 MED ORDER — SODIUM CHLORIDE 0.9 % IV BOLUS
1000.0000 mL | Freq: Once | INTRAVENOUS | Status: AC
  Administered 2019-03-04: 1000 mL via INTRAVENOUS

## 2019-03-04 NOTE — ED Notes (Signed)
ED Provider at bedside. 

## 2019-03-04 NOTE — ED Notes (Signed)
Patient transported to X-ray 

## 2019-03-04 NOTE — Discharge Instructions (Addendum)
Recommend taking Robaxin as a muscle relaxer as well as Tylenol for your pain.  Please call your primary care doctor to schedule a follow-up, ideally to be seen within the next few days regarding your symptoms today as well as your kidney disease.  Please call the orthopedic specialist to discuss your ongoing shoulder pain.  Should you develop chest pain, difficulty breathing, arm weakness, arm numbness, abdominal pain or other new concerning symptom please return to ER for reassessment.  Please finish the antibiotics as previously prescribed for your diverticulitis.

## 2019-03-04 NOTE — ED Triage Notes (Addendum)
R shoulder pain since Tuesday. States he was lightheaded and fell on Monday. C/o ongoing lightheadedness as well. Also currently being tx for diverticulitis.

## 2019-03-04 NOTE — ED Notes (Signed)
Pt family member at bedside.

## 2019-03-04 NOTE — ED Provider Notes (Signed)
[facility] EMERGENCY DEPARTMENT Provider Note   CSN: [id number] Arrival date & time: 03/04/19  1403     History   Chief Complaint Chief Complaint  Patient presents with  . Shoulder Pain  . Dizziness    HPI [name] is a 58 y.o. male.  Past medical history hypertension, CKD, GI bleed, COPD, recent diagnosis of diverticulitis presents to ER with right shoulder pain, feeling lightheaded.  Patient states since Tuesday woke up with some right shoulder pain, radiates from right lateral neck to right shoulder, worse with movements and certain positions.  Has been taking naproxen with some relief.  No recent falls or trauma.  No associated chest pain.  No associated numbness, weakness in his arm.  No fevers.  Since Monday he has been feeling more lightheaded, generalized fatigue.  No episodes of passing out or near syncope. Regarding recent diagnosis diverticulitis, has been taking Cipro and Flagyl as prescribed.  No further episodes of abdominal pain, no vomiting or diarrhea.    HPI  Past Medical History:  Diagnosis Date  . Chest pain    a. 11/2002 Cath: EF 68%, LM nl, LAD small, nl, LCX nl, RCA tortuous/nl;  b. 08/2011 St Echo: EF 60%, normal contractility, no scar/ischemia.  Marland Kitchen COPD (chronic obstructive pulmonary disease) ([place])   . Enlarged prostate   . GERD (gastroesophageal reflux disease)   . GI bleeding 08/2018  . Hypertension   . Sickle cell trait ([place])   . Sleep apnea   . SVT (supraventricular tachycardia) [facility])     Patient Active Problem List   Diagnosis Date Noted  . Adenomatous polyp of transverse colon   . Gastrointestinal hemorrhage 08/25/2018  . CKD (chronic kidney disease) stage 3, GFR 30-59 ml/min 08/25/2018  . PAF (paroxysmal atrial fibrillation) ([place]) 08/25/2018  . Atypical chest pain 04/04/2017  . Abdominal pain 12/05/2013  . Diarrhea 12/05/2013  . Hypokalemia 12/05/2013  . Chest pain- atypical. Normal LHC 03/2013 03/10/2013  . HTN  (hypertension) 03/10/2013  . Nonspecific abnormal electrocardiogram (ECG) (EKG) 03/10/2013    Past Surgical History:  Procedure Laterality Date  . ATRIAL FIBRILLATION ABLATION    . BIOPSY  08/27/2018   Procedure: BIOPSY;  Surgeon: Thornton Park, MD;  Location: [place];  Service: Gastroenterology;;  . BIOPSY  08/28/2018   Procedure: BIOPSY;  Surgeon: Thornton Park, MD;  Location: [place];  Service: Gastroenterology;;  . COLONOSCOPY WITH PROPOFOL N/A 08/28/2018   Procedure: COLONOSCOPY WITH PROPOFOL;  Surgeon: Thornton Park, MD;  Location: [place];  Service: Gastroenterology;  Laterality: N/A;  . ENTEROSCOPY N/A 08/27/2018   Procedure: ENTEROSCOPY;  Surgeon: Thornton Park, MD;  Location: [place];  Service: Gastroenterology;  Laterality: N/A;  . GIVENS CAPSULE STUDY N/A 08/28/2018   Procedure: GIVENS CAPSULE STUDY;  Surgeon: Thornton Park, MD;  Location: [place];  Service: Gastroenterology;  Laterality: N/A;  . LEFT HEART CATHETERIZATION WITH CORONARY ANGIOGRAM N/A 03/11/2013   Procedure: LEFT HEART CATHETERIZATION WITH CORONARY ANGIOGRAM;  Surgeon: Jolaine Artist, MD;  Location: [facility] CATH LAB;  Service: Cardiovascular;  Laterality: N/A;  . POLYPECTOMY  08/28/2018   Procedure: POLYPECTOMY;  Surgeon: Thornton Park, MD;  Location: [place];  Service: Gastroenterology;;  . TONSILLECTOMY    . TRANSURETHRAL RESECTION OF PROSTATE          Home Medications    Prior to Admission medications   Medication Sig Start Date End Date Taking? Authorizing Provider  amLODipine (NORVASC) 10 MG tablet Take 1 tablet (10 mg total)  by mouth daily. 02/07/15   Charlesetta Shanks, MD  carvedilol (COREG) 3.125 MG tablet  09/12/18   [provider]  cetirizine (ZYRTEC) 10 MG tablet Take 10 mg by mouth daily.    [provider]  famotidine (PEPCID) 40 MG tablet  09/17/18   [provider]  linaclotide Rolan Lipa) 290 MCG CAPS capsule Take 1  capsule (290 mcg total) by mouth daily before breakfast. 08/30/18   Hongalgi, Lenis Dickinson, MD  losartan (COZAAR) 25 MG tablet  09/12/18   [provider]  methocarbamol (ROBAXIN) 500 MG tablet Take 1 tablet (500 mg total) by mouth every 8 (eight) hours as needed for muscle spasms. 03/04/19   Lucrezia Starch, MD  Multiple Vitamins-Minerals (MULTIVITAMIN WITH MINERALS) tablet Take 1 tablet by mouth daily.    [provider]  nitroGLYCERIN (NITROSTAT) 0.4 MG SL tablet Place 1 tablet (0.4 mg total) under the tongue every 5 (five) minutes as needed for chest pain. 03/12/13   Thurnell Lose, MD  ondansetron (ZOFRAN) 8 MG tablet Take 8 mg by mouth every 8 (eight) hours as needed. 10/20/18   [provider]  oxyCODONE-acetaminophen (PERCOCET) 5-325 MG tablet Take 1 tablet by mouth every 4 (four) hours as needed for severe pain. 02/26/19   Molpus, John, MD  pantoprazole (PROTONIX) 40 MG tablet Take 40 mg by mouth daily. 07/28/14   [provider]  polyethylene glycol (MIRALAX / GLYCOLAX) 17 g packet Take 17 g by mouth 2 (two) times daily. 08/29/18   Hongalgi, Lenis Dickinson, MD  sildenafil (REVATIO) 20 MG tablet TAKE 3-5 TABLETS BY MOUTH AS DIRECTED 11/25/18   [provider]  sotalol (BETAPACE) 80 MG tablet Take 40 mg by mouth 2 (two) times daily.    [provider]  SUMAtriptan (IMITREX) 50 MG tablet Take 50-100 mg by mouth once as needed for migraine. 05/03/16   [provider]  tiotropium (SPIRIVA) 18 MCG inhalation capsule Place 18 mcg into inhaler and inhale daily.    [provider]  triamterene-hydrochlorothiazide (DYAZIDE) 37.5-25 MG capsule Take 1 capsule by mouth every morning. 01/27/19   [provider]  XARELTO 20 MG TABS tablet Take 20 mg by mouth daily. 05/03/18   [provider]  furosemide (LASIX) 20 MG tablet Take 20 mg by mouth daily. 04/12/18 02/26/19  [provider]  gabapentin (NEURONTIN) 300 MG capsule  Take 300 mg by mouth at bedtime.  02/26/19  [provider]    Family History Family History  Problem Relation Age of Onset  . CAD Mother        died in her 73's - MI  . Multiple myeloma Brother   . Sickle cell anemia Father        died @ 21  . Gastric cancer Neg Hx     Social History Social History   Tobacco Use  . Smoking status: Never Smoker  . Smokeless tobacco: Never Used  Substance Use Topics  . Alcohol use: No  . Drug use: Yes    Types: Marijuana    Comment: reports smoking CBD     Allergies   Patient has no known allergies.   Review of Systems Review of Systems  Constitutional: Negative for chills and fever.  HENT: Negative for ear pain and sore throat.   Eyes: Negative for pain and visual disturbance.  Respiratory: Negative for cough and shortness of breath.   Cardiovascular: Negative for chest pain and palpitations.  Gastrointestinal: Negative for abdominal pain  and vomiting.  Genitourinary: Negative for dysuria and hematuria.  Musculoskeletal: Positive for arthralgias. Negative for back pain.  Skin: Negative for color change and rash.  Neurological: Positive for light-headedness. Negative for seizures and syncope.  All other systems reviewed and are negative.    Physical Exam Updated Vital Signs BP 133/88 (BP Location: Right Arm)   Pulse 65   Temp 98.9 F (37.2 C) (Oral)   Resp 14   Ht 6' (1.829 m)   Wt 97.5 kg   SpO2 99%   BMI 29.16 kg/m   Physical Exam Vitals signs and nursing note reviewed.  Constitutional:      Appearance: He is well-developed.  HENT:     Head: Normocephalic and atraumatic.  Eyes:     Conjunctiva/sclera: Conjunctivae normal.  Neck:     Musculoskeletal: Neck supple.  Cardiovascular:     Rate and Rhythm: Normal rate and regular rhythm.     Heart sounds: No murmur.  Pulmonary:     Effort: Pulmonary effort is normal. No respiratory distress.     Breath sounds: Normal breath sounds.  Abdominal:      Palpations: Abdomen is soft.     Tenderness: There is no abdominal tenderness.  Musculoskeletal:     Comments: TTP over right shoulder, TTP over right lateral neck, no midline C, T, L spine TTP; some pain with shoulder ROM but able to have near full passive ROM; no TTP throughout remainder of RUE  Skin:    General: Skin is warm and dry.  Neurological:     General: No focal deficit present.     Mental Status: He is alert and oriented to person, place, and time.     Comments: 5/5 strength in bilateral upper and lower extremities, sensation to light touch intact in bilateral upper and lower extremities      ED Treatments / Results  Labs (all labs ordered are listed, but only abnormal results are displayed) Labs Reviewed  CBC WITH DIFFERENTIAL/PLATELET - Abnormal; Notable for the following components:      Result Value   Hemoglobin 11.9 (*)    HCT 38.8 (*)    MCV 71.2 (*)    MCH 21.8 (*)    RDW 16.7 (*)    All other components within normal limits  BASIC METABOLIC PANEL - Abnormal; Notable for the following components:   Potassium 3.3 (*)    Creatinine, Ser 1.74 (*)    GFR calc non Af Amer 42 (*)    GFR calc Af Amer 49 (*)    All other components within normal limits    EKG EKG at 2:20 PM on March 04, 2019 shows sinus rhythm, rate 75, left anterior fascicular block, borderline T wave abnormality in inferior leads, when compared to prior ECG on Aug 25, 2018, no significant change was appreciated, no acute STEMI    None  Radiology Dg Shoulder Right  Result Date: 03/04/2019 CLINICAL DATA:  Right shoulder pain for 2 days. This was following a fall 3 days ago. EXAM: RIGHT SHOULDER - 2+ VIEW COMPARISON:  None. FINDINGS: No fracture or bone lesion. Glenohumeral joint is normally spaced and aligned. Minor spurring from the inferior glenoid. AC joint is normally spaced and aligned with small distal clavicle marginal spurs. Soft tissues are unremarkable. IMPRESSION: 1. No fracture  or acute finding. Electronically Signed   By: Lajean Manes M.D.   On: 03/04/2019 15:39    Procedures Procedures (including critical care time)  Medications Ordered in ED  Medications  diazepam (VALIUM) tablet 5 mg (5 mg Oral Given 03/04/19 1526)  HYDROmorphone (DILAUDID) injection 0.5 mg (0.5 mg Intravenous Given 03/04/19 1526)  sodium chloride 0.9 % bolus 1,000 mL (0 mLs Intravenous Stopped 03/04/19 1730)  potassium chloride SA (KLOR-CON) CR tablet 20 mEq (20 mEq Oral Given 03/04/19 1639)     Initial Impression / Assessment and Plan / ED Course  I have reviewed the triage vital signs and the nursing notes.  Pertinent labs & imaging results that were available during my care of the patient were reviewed by me and considered in my medical decision making (see chart for details).  Clinical Course as of Mar 04 1739  Thu Mar 04, 2019  1740 Recheck, symptoms improved, remains well-appearing with normal vital signs, will discharge home with plan for PCP sports medicine follow-up   [RD]    Clinical Course User Index [RD] Lucrezia Starch, MD      58 year old male presents to ER with chief complaint of right shoulder pain as well as feeling more lightheaded.  In regards to shoulder pain, no obvious deformity, no recent trauma, x-ray negative.  No significant change in range of motion, no swelling or erythema or fevers, doubt septic joint.  No midline C-spine tenderness, no neuro deficits.  Suspect most likely etiology muscle strain.  Symptoms improved after dose of pain medicine and muscle relaxer.  Recommend follow-up with Ortho.  Regarding lightheadedness and fatigue, patient was well-appearing with normal vital signs.  EKG without any changes from prior.  Labs showed slight worsening of baseline CKD, suspect dehydration.  Also mild hypokalemia.  Given fluids and potassium replacement here.  Recommend for this issue patient have close follow-up with his primary doctor.  He has no abdominal  pain, no tenderness on exam or ongoing GI symptoms, normal WBC, doubt any complications from his recent diagnosis of diverticulitis.  Recommended he finish the course of antibiotics as previously prescribed.    After the discussed management above, the patient was determined to be safe for discharge.  The patient was in agreement with this plan and all questions regarding their care were answered.  ED return precautions were discussed and the patient will return to the ED with any significant worsening of condition.   Final Clinical Impressions(s) / ED Diagnoses   Final diagnoses:  Strain of right shoulder, initial encounter    ED Discharge Orders         Ordered    methocarbamol (ROBAXIN) 500 MG tablet  Every 8 hours PRN     03/04/19 1734           Lucrezia Starch, MD 03/04/19 1749

## 2019-03-04 NOTE — ED Notes (Signed)
ED Provider at bedside discussing test results and dispo plan of care. 

## 2019-03-08 ENCOUNTER — Other Ambulatory Visit: Payer: Self-pay

## 2019-03-08 ENCOUNTER — Encounter: Payer: Self-pay | Admitting: Family Medicine

## 2019-03-08 ENCOUNTER — Ambulatory Visit (HOSPITAL_BASED_OUTPATIENT_CLINIC_OR_DEPARTMENT_OTHER)
Admission: RE | Admit: 2019-03-08 | Discharge: 2019-03-08 | Disposition: A | Discharge status: Home / Self Care | Payer: Medicare Other | Source: Ambulatory Visit | Attending: Family Medicine | Admitting: Family Medicine

## 2019-03-08 ENCOUNTER — Ambulatory Visit: Payer: Medicare Other | Admitting: Family Medicine

## 2019-03-08 VITALS — BP 133/86 | HR 64 | Ht 72.0 in | Wt 215.0 lb

## 2019-03-08 DIAGNOSIS — M542 Cervicalgia: Secondary | ICD-10-CM | POA: Diagnosis present

## 2019-03-08 DIAGNOSIS — M5412 Radiculopathy, cervical region: Secondary | ICD-10-CM | POA: Insufficient documentation

## 2019-03-08 MED ORDER — PREDNISONE 5 MG PO TABS: ORAL_TABLET | ORAL | 0 refills | Status: DC

## 2019-03-08 NOTE — Patient Instructions (Signed)
Nice to meet you Please try heat or ice  Please try the soft collar intermittently.  Please try the range of motion movements.   Please send me a message in MyChart with any questions or updates.  Please see me back in 2 weeks.   --Dr. Raeford Razor

## 2019-03-08 NOTE — Assessment & Plan Note (Signed)
Had initial injury of a fall but did not completely lose consciousness.  Seems to be more spasm in nature.  Less likely for compression fracture. -X-ray. -Cervical soft collar. -Prednisone. -Counseled on home exercise therapy and supportive care. -Can consider physical therapy or trigger point injections.

## 2019-03-08 NOTE — Progress Notes (Signed)
[name] - 58 y.o. male MRN [id number]  Date of birth: [birth year]  SUBJECTIVE:  Including CC & ROS.  Chief Complaint  Patient presents with  . Shoulder Pain    right shoulder x 1 week  . Neck Pain    right side x 1 week    [name] is a 58 y.o. male that is presenting with acute neck pain.  The pain is been ongoing for about a week.  The pain is occurring over the posterior neck as well as the right trapezius.  He has pain that is constant and severe.  He was evaluated the emergency department due to the pain.  He is having to sleep upright.  Any neck movement seems to exacerbate the pain.  Has been taking the muscle relaxer with no improvement.  No history of similar symptoms.  He did have a fall where his pain originated from.  Denies any pain going down his arm.  Pain is sharp and severe.  Independent review of the right shoulder x-ray from 11/26 shows no acute abnormalities.   Review of Systems  Constitutional: Negative for fever.  HENT: Negative for congestion.   Respiratory: Negative for cough.   Cardiovascular: Negative for chest pain.  Gastrointestinal: Negative for abdominal pain.  Musculoskeletal: Positive for neck pain.  Skin: Negative for color change.  Neurological: Negative for weakness.  Hematological: Negative for adenopathy.    HISTORY: Past Medical, Surgical, Social, and Family History Reviewed & Updated per EMR.   Pertinent Historical Findings include:  Past Medical History:  Diagnosis Date  . Chest pain    a. 11/2002 Cath: EF 68%, LM nl, LAD small, nl, LCX nl, RCA tortuous/nl;  b. 08/2011 St Echo: EF 60%, normal contractility, no scar/ischemia.  Marland Kitchen COPD (chronic obstructive pulmonary disease) ([place])   . Enlarged prostate   . GERD (gastroesophageal reflux disease)   . GI bleeding 08/2018  . Hypertension   . Sickle cell trait ([place])   . Sleep apnea   . SVT (supraventricular tachycardia) ([facility])     Past Surgical History:  Procedure Laterality  Date  . ATRIAL FIBRILLATION ABLATION    . BIOPSY  08/27/2018   Procedure: BIOPSY;  Surgeon: Thornton Park, MD;  Location: [place];  Service: Gastroenterology;;  . BIOPSY  08/28/2018   Procedure: BIOPSY;  Surgeon: Thornton Park, MD;  Location: [place];  Service: Gastroenterology;;  . COLONOSCOPY WITH PROPOFOL N/A 08/28/2018   Procedure: COLONOSCOPY WITH PROPOFOL;  Surgeon: Thornton Park, MD;  Location: [place];  Service: Gastroenterology;  Laterality: N/A;  . ENTEROSCOPY N/A 08/27/2018   Procedure: ENTEROSCOPY;  Surgeon: Thornton Park, MD;  Location: [place];  Service: Gastroenterology;  Laterality: N/A;  . GIVENS CAPSULE STUDY N/A 08/28/2018   Procedure: GIVENS CAPSULE STUDY;  Surgeon: Thornton Park, MD;  Location: [place];  Service: Gastroenterology;  Laterality: N/A;  . LEFT HEART CATHETERIZATION WITH CORONARY ANGIOGRAM N/A 03/11/2013   Procedure: LEFT HEART CATHETERIZATION WITH CORONARY ANGIOGRAM;  Surgeon: Jolaine Artist, MD;  Location: [facility] CATH LAB;  Service: Cardiovascular;  Laterality: N/A;  . POLYPECTOMY  08/28/2018   Procedure: POLYPECTOMY;  Surgeon: Thornton Park, MD;  Location: [place];  Service: Gastroenterology;;  . TONSILLECTOMY    . TRANSURETHRAL RESECTION OF PROSTATE      No Known Allergies  Family History  Problem Relation Age of Onset  . CAD Mother        died in her 22's - MI  . Multiple myeloma Brother   .  Sickle cell anemia Father        died @ 44  . Gastric cancer Neg Hx      Social History   Socioeconomic History  . Marital status: Divorced    Spouse name: Not on file  . Number of children: Not on file  . Years of education: Not on file  . Highest education level: Not on file  Occupational History  . Not on file  Social Needs  . Financial resource strain: Not on file  . Food insecurity    Worry: Not on file    Inability: Not on file  . Transportation needs    Medical: Not on file    Non-medical:  Not on file  Tobacco Use  . Smoking status: Never Smoker  . Smokeless tobacco: Never Used  Substance and Sexual Activity  . Alcohol use: No  . Drug use: Yes    Types: Marijuana    Comment: reports smoking CBD  . Sexual activity: Not Currently    Birth control/protection: None  Lifestyle  . Physical activity    Days per week: Not on file    Minutes per session: Not on file  . Stress: Not on file  Relationships  . Social Herbalist on phone: Not on file    Gets together: Not on file    Attends religious service: Not on file    Active member of club or organization: Not on file    Attends meetings of clubs or organizations: Not on file    Relationship status: Not on file  . Intimate partner violence    Fear of current or ex partner: Not on file    Emotionally abused: Not on file    Physically abused: Not on file    Forced sexual activity: Not on file  Other Topics Concern  . Not on file  Social History Narrative   Lives in [place] with dtr.     PHYSICAL EXAM:  VS: BP 133/86   Pulse 64   Ht 6' (1.829 m)   Wt 215 lb (97.5 kg)   BMI 29.16 kg/m  Physical Exam Gen: NAD, alert, cooperative with exam, well-appearing ENT: normal lips, normal nasal mucosa,  Eye: normal EOM, normal conjunctiva and lids CV:  no edema, +2 pedal pulses   Resp: no accessory muscle use, non-labored,  Skin: no rashes, no areas of induration  Neuro: normal tone, normal sensation to touch Psych:  normal insight, alert and oriented MSK:  Neck: Tenderness palpation over the posterior neck on the right side and in the trapezius. Limited flexion extension. Limited lateral rotation. Normal strength resistance with shrug. Normal shoulder range of motion. Neurovascularly intact     ASSESSMENT & PLAN:   Neck pain Had initial injury of a fall but did not completely lose consciousness.  Seems to be more spasm in nature.  Less likely for compression fracture. -X-ray. -Cervical soft collar.  -Prednisone. -Counseled on home exercise therapy and supportive care. -Can consider physical therapy or trigger point injections.

## 2019-03-09 ENCOUNTER — Telehealth: Payer: Self-pay | Admitting: Family Medicine

## 2019-03-09 NOTE — Telephone Encounter (Signed)
Left VM for patient. If he calls back please have him speak with a nurse/CMA and inform that he has several layers in the neck that demonstrate severe degenerative changes. No compression fracture. Would continue current plan with close follow up.   If any questions then please take the best time and phone number to call and I will try to call him back.   Rosemarie Ax, MD [facility] 03/09/2019, 9:01 AM

## 2019-03-09 NOTE — Telephone Encounter (Signed)
Spoke to patient and gave him xray results information provided by the physician.

## 2019-03-09 NOTE — Telephone Encounter (Signed)
Patient returning call for xray results  

## 2019-03-19 ENCOUNTER — Encounter: Payer: Self-pay | Admitting: Family Medicine

## 2019-03-19 ENCOUNTER — Ambulatory Visit (INDEPENDENT_AMBULATORY_CARE_PROVIDER_SITE_OTHER): Payer: Medicare Other | Admitting: Family Medicine

## 2019-03-19 ENCOUNTER — Other Ambulatory Visit: Payer: Self-pay

## 2019-03-19 VITALS — BP 144/97 | HR 69 | Ht 72.0 in | Wt 215.0 lb

## 2019-03-19 DIAGNOSIS — M5412 Radiculopathy, cervical region: Secondary | ICD-10-CM | POA: Diagnosis not present

## 2019-03-19 MED ORDER — METHYLPREDNISOLONE ACETATE 40 MG/ML IJ SUSP
40.0000 mg | Freq: Once | INTRAMUSCULAR | Status: AC
  Administered 2019-03-19: 40 mg via INTRAMUSCULAR

## 2019-03-19 NOTE — Progress Notes (Signed)
[name] - 58 y.o. male MRN [id number]  Date of birth: 1960/07/29  SUBJECTIVE:  Including CC & ROS.  No chief complaint on file.   [name] is a 58 y.o. male that is following up for his right-sided neck pain and radicular type symptoms.  His symptoms were improved on a couple of the last days of the prednisone.  Since that time his pain has returned and felt the same.  He is having this significant pain in the right trapezius that radiates down to the shoulder as well as the clavicle region.  He holds his arm on top of his head to help with the pain.  The cervical collar offers limited improvement.  He reports a history of ever neurologist telling him he needed surgery several years ago..  Independent review of the cervical x-ray from 11/30 shows no acute abnormality.   Review of Systems  Constitutional: Negative for fever.  HENT: Negative for congestion.   Respiratory: Negative for cough.   Cardiovascular: Negative for chest pain.  Gastrointestinal: Negative for abdominal pain.  Musculoskeletal: Positive for back pain and neck pain.  Skin: Negative for color change.  Neurological: Negative for weakness.  Hematological: Negative for adenopathy.    HISTORY: Past Medical, Surgical, Social, and Family History Reviewed & Updated per EMR.   Pertinent Historical Findings include:  Past Medical History:  Diagnosis Date  . Chest pain    a. 11/2002 Cath: EF 68%, LM nl, LAD small, nl, LCX nl, RCA tortuous/nl;  b. 08/2011 St Echo: EF 60%, normal contractility, no scar/ischemia.  Marland Kitchen COPD (chronic obstructive pulmonary disease) ([place])   . Enlarged prostate   . GERD (gastroesophageal reflux disease)   . GI bleeding 08/2018  . Hypertension   . Sickle cell trait ([place])   . Sleep apnea   . SVT (supraventricular tachycardia) ([facility])     Past Surgical History:  Procedure Laterality Date  . ATRIAL FIBRILLATION ABLATION    . BIOPSY  08/27/2018   Procedure: BIOPSY;  Surgeon: Thornton Park, MD;  Location: [facility];  Service: Gastroenterology;;  . BIOPSY  08/28/2018   Procedure: BIOPSY;  Surgeon: Thornton Park, MD;  Location: [facility];  Service: Gastroenterology;;  . COLONOSCOPY WITH PROPOFOL N/A 08/28/2018   Procedure: COLONOSCOPY WITH PROPOFOL;  Surgeon: Thornton Park, MD;  Location: [facility];  Service: Gastroenterology;  Laterality: N/A;  . ENTEROSCOPY N/A 08/27/2018   Procedure: ENTEROSCOPY;  Surgeon: Thornton Park, MD;  Location: [facility];  Service: Gastroenterology;  Laterality: N/A;  . GIVENS CAPSULE STUDY N/A 08/28/2018   Procedure: GIVENS CAPSULE STUDY;  Surgeon: Thornton Park, MD;  Location: [facility];  Service: Gastroenterology;  Laterality: N/A;  . LEFT HEART CATHETERIZATION WITH CORONARY ANGIOGRAM N/A 03/11/2013   Procedure: LEFT HEART CATHETERIZATION WITH CORONARY ANGIOGRAM;  Surgeon: Jolaine Artist, MD;  Location: [facility] CATH LAB;  Service: Cardiovascular;  Laterality: N/A;  . POLYPECTOMY  08/28/2018   Procedure: POLYPECTOMY;  Surgeon: Thornton Park, MD;  Location: [facility];  Service: Gastroenterology;;  . TONSILLECTOMY    . TRANSURETHRAL RESECTION OF PROSTATE      No Known Allergies  Family History  Problem Relation Age of Onset  . CAD Mother        died in her 41's - MI  . Multiple myeloma Brother   . Sickle cell anemia Father        died @ 74  . Gastric cancer Neg Hx      Social History   Socioeconomic  History  . Marital status: Divorced    Spouse name: Not on file  . Number of children: Not on file  . Years of education: Not on file  . Highest education level: Not on file  Occupational History  . Not on file  Tobacco Use  . Smoking status: Never Smoker  . Smokeless tobacco: Never Used  Substance and Sexual Activity  . Alcohol use: No  . Drug use: Yes    Types: Marijuana    Comment: reports smoking CBD  . Sexual activity: Not Currently    Birth control/protection: None  Other Topics Concern   . Not on file  Social History Narrative   Lives in [place] with dtr.   Social Determinants of Health   Financial Resource Strain:   . Difficulty of Paying Living Expenses: Not on file  Food Insecurity:   . Worried About Charity fundraiser in the Last Year: Not on file  . Ran Out of Food in the Last Year: Not on file  Transportation Needs:   . Lack of Transportation (Medical): Not on file  . Lack of Transportation (Non-Medical): Not on file  Physical Activity:   . Days of Exercise per Week: Not on file  . Minutes of Exercise per Session: Not on file  Stress:   . Feeling of Stress : Not on file  Social Connections:   . Frequency of Communication with Friends and Family: Not on file  . Frequency of Social Gatherings with Friends and Family: Not on file  . Attends Religious Services: Not on file  . Active Member of Clubs or Organizations: Not on file  . Attends Archivist Meetings: Not on file  . Marital Status: Not on file  Intimate Partner Violence:   . Fear of Current or Ex-Partner: Not on file  . Emotionally Abused: Not on file  . Physically Abused: Not on file  . Sexually Abused: Not on file     PHYSICAL EXAM:  VS: There were no vitals taken for this visit. Physical Exam Gen: NAD, alert, cooperative with exam, well-appearing ENT: normal lips, normal nasal mucosa,  Eye: normal EOM, normal conjunctiva and lids CV:  no edema, +2 pedal pulses   Resp: no accessory muscle use, non-labored,  Skin: no rashes, no areas of induration  Neuro: normal tone, normal sensation to touch Psych:  normal insight, alert and oriented MSK:  Neck/right shoulder: Limited flexion extension. Limited lateral rotation. Tenderness palpation over the right trapezius. No winging of the scapula. Normal shoulder range of motion. Normal strength resistance with shrug. Neurovascularly intact     ASSESSMENT & PLAN:   Cervical radiculopathy Pain improved slightly with the prednisone.   It has returned and is severe.  He has remote history of a neurologist telling him he needed surgery due to herniations in his neck previously. -IM Depo.  Holding anti-inflammatories as his creatinine has fluctuated over the past months. -Referral to physical therapy. -Counseled on home exercise therapy and supportive care.  Can try the soft collar as needed. -Can try the gabapentin he has already at home. -If no improvement consider MRI for evaluation of nerve impingement.

## 2019-03-19 NOTE — Patient Instructions (Signed)
Good to see you Please try heat on the area  Please try the cervical collar if it helps You can try the gabapentin again.  Physical therapy will give you a call    Please send me a message in MyChart with any questions or updates.  Please send me a message after a week or two if no better and we can order an MRI.   --Dr. Raeford Razor

## 2019-03-19 NOTE — Addendum Note (Signed)
Addended by: Sherrie George F on: 03/19/2019 11:11 AM   Modules accepted: Orders

## 2019-03-19 NOTE — Assessment & Plan Note (Signed)
Pain improved slightly with the prednisone.  It has returned and is severe.  He has remote history of a neurologist telling him he needed surgery due to herniations in his neck previously. -IM Depo.  Holding anti-inflammatories as his creatinine has fluctuated over the past months. -Referral to physical therapy. -Counseled on home exercise therapy and supportive care.  Can try the soft collar as needed. -Can try the gabapentin he has already at home. -If no improvement consider MRI for evaluation of nerve impingement.

## 2019-04-23 ENCOUNTER — Emergency Department (HOSPITAL_BASED_OUTPATIENT_CLINIC_OR_DEPARTMENT_OTHER): Payer: Medicare Other

## 2019-04-23 ENCOUNTER — Encounter (HOSPITAL_BASED_OUTPATIENT_CLINIC_OR_DEPARTMENT_OTHER): Payer: Self-pay

## 2019-04-23 ENCOUNTER — Other Ambulatory Visit: Payer: Self-pay

## 2019-04-23 ENCOUNTER — Observation Stay (HOSPITAL_BASED_OUTPATIENT_CLINIC_OR_DEPARTMENT_OTHER): Payer: Medicare Other

## 2019-04-23 ENCOUNTER — Inpatient Hospital Stay (HOSPITAL_BASED_OUTPATIENT_CLINIC_OR_DEPARTMENT_OTHER)
Admission: EM | Admit: 2019-04-23 | Discharge: 2019-04-29 | DRG: 378 | Disposition: A | Discharge status: Home / Self Care | Payer: Medicare Other | Attending: Internal Medicine | Admitting: Internal Medicine

## 2019-04-23 DIAGNOSIS — E876 Hypokalemia: Secondary | ICD-10-CM | POA: Diagnosis present

## 2019-04-23 DIAGNOSIS — D123 Benign neoplasm of transverse colon: Secondary | ICD-10-CM | POA: Diagnosis present

## 2019-04-23 DIAGNOSIS — S0101XA Laceration without foreign body of scalp, initial encounter: Secondary | ICD-10-CM | POA: Diagnosis present

## 2019-04-23 DIAGNOSIS — Z832 Family history of diseases of the blood and blood-forming organs and certain disorders involving the immune mechanism: Secondary | ICD-10-CM

## 2019-04-23 DIAGNOSIS — N1831 Chronic kidney disease, stage 3a: Secondary | ICD-10-CM | POA: Diagnosis not present

## 2019-04-23 DIAGNOSIS — D573 Sickle-cell trait: Secondary | ICD-10-CM | POA: Diagnosis present

## 2019-04-23 DIAGNOSIS — Z9119 Patient's noncompliance with other medical treatment and regimen: Secondary | ICD-10-CM

## 2019-04-23 DIAGNOSIS — K5731 Diverticulosis of large intestine without perforation or abscess with bleeding: Principal | ICD-10-CM | POA: Diagnosis present

## 2019-04-23 DIAGNOSIS — W1811XA Fall from or off toilet without subsequent striking against object, initial encounter: Secondary | ICD-10-CM | POA: Diagnosis present

## 2019-04-23 DIAGNOSIS — Z7951 Long term (current) use of inhaled steroids: Secondary | ICD-10-CM

## 2019-04-23 DIAGNOSIS — G43909 Migraine, unspecified, not intractable, without status migrainosus: Secondary | ICD-10-CM | POA: Diagnosis present

## 2019-04-23 DIAGNOSIS — K219 Gastro-esophageal reflux disease without esophagitis: Secondary | ICD-10-CM | POA: Diagnosis present

## 2019-04-23 DIAGNOSIS — D62 Acute posthemorrhagic anemia: Secondary | ICD-10-CM | POA: Diagnosis present

## 2019-04-23 DIAGNOSIS — Z7901 Long term (current) use of anticoagulants: Secondary | ICD-10-CM

## 2019-04-23 DIAGNOSIS — R079 Chest pain, unspecified: Secondary | ICD-10-CM | POA: Diagnosis present

## 2019-04-23 DIAGNOSIS — I10 Essential (primary) hypertension: Secondary | ICD-10-CM | POA: Diagnosis not present

## 2019-04-23 DIAGNOSIS — Z20822 Contact with and (suspected) exposure to covid-19: Secondary | ICD-10-CM | POA: Diagnosis present

## 2019-04-23 DIAGNOSIS — J449 Chronic obstructive pulmonary disease, unspecified: Secondary | ICD-10-CM | POA: Diagnosis not present

## 2019-04-23 DIAGNOSIS — Z8673 Personal history of transient ischemic attack (TIA), and cerebral infarction without residual deficits: Secondary | ICD-10-CM

## 2019-04-23 DIAGNOSIS — K2980 Duodenitis without bleeding: Secondary | ICD-10-CM | POA: Diagnosis present

## 2019-04-23 DIAGNOSIS — K921 Melena: Secondary | ICD-10-CM | POA: Diagnosis present

## 2019-04-23 DIAGNOSIS — R002 Palpitations: Secondary | ICD-10-CM

## 2019-04-23 DIAGNOSIS — D509 Iron deficiency anemia, unspecified: Secondary | ICD-10-CM | POA: Diagnosis present

## 2019-04-23 DIAGNOSIS — Z9181 History of falling: Secondary | ICD-10-CM

## 2019-04-23 DIAGNOSIS — G4733 Obstructive sleep apnea (adult) (pediatric): Secondary | ICD-10-CM | POA: Diagnosis present

## 2019-04-23 DIAGNOSIS — I480 Paroxysmal atrial fibrillation: Secondary | ICD-10-CM | POA: Diagnosis present

## 2019-04-23 DIAGNOSIS — Y92238 Other place in hospital as the place of occurrence of the external cause: Secondary | ICD-10-CM | POA: Diagnosis present

## 2019-04-23 DIAGNOSIS — R55 Syncope and collapse: Secondary | ICD-10-CM | POA: Diagnosis present

## 2019-04-23 DIAGNOSIS — N400 Enlarged prostate without lower urinary tract symptoms: Secondary | ICD-10-CM | POA: Diagnosis present

## 2019-04-23 DIAGNOSIS — Z8241 Family history of sudden cardiac death: Secondary | ICD-10-CM

## 2019-04-23 DIAGNOSIS — Q2730 Arteriovenous malformation, site unspecified: Secondary | ICD-10-CM

## 2019-04-23 DIAGNOSIS — I129 Hypertensive chronic kidney disease with stage 1 through stage 4 chronic kidney disease, or unspecified chronic kidney disease: Secondary | ICD-10-CM | POA: Diagnosis present

## 2019-04-23 DIAGNOSIS — E869 Volume depletion, unspecified: Secondary | ICD-10-CM | POA: Diagnosis present

## 2019-04-23 DIAGNOSIS — K922 Gastrointestinal hemorrhage, unspecified: Secondary | ICD-10-CM | POA: Diagnosis not present

## 2019-04-23 DIAGNOSIS — I471 Supraventricular tachycardia: Secondary | ICD-10-CM | POA: Diagnosis present

## 2019-04-23 DIAGNOSIS — N183 Chronic kidney disease, stage 3 unspecified: Secondary | ICD-10-CM | POA: Diagnosis present

## 2019-04-23 DIAGNOSIS — K449 Diaphragmatic hernia without obstruction or gangrene: Secondary | ICD-10-CM | POA: Diagnosis present

## 2019-04-23 DIAGNOSIS — Z8249 Family history of ischemic heart disease and other diseases of the circulatory system: Secondary | ICD-10-CM

## 2019-04-23 DIAGNOSIS — K581 Irritable bowel syndrome with constipation: Secondary | ICD-10-CM | POA: Diagnosis present

## 2019-04-23 DIAGNOSIS — Z79899 Other long term (current) drug therapy: Secondary | ICD-10-CM

## 2019-04-23 DIAGNOSIS — Z807 Family history of other malignant neoplasms of lymphoid, hematopoietic and related tissues: Secondary | ICD-10-CM

## 2019-04-23 DIAGNOSIS — Z23 Encounter for immunization: Secondary | ICD-10-CM

## 2019-04-23 HISTORY — DX: Unspecified atrial fibrillation: I48.91

## 2019-04-23 LAB — BASIC METABOLIC PANEL
Anion gap: 9 (ref 5–15)
BUN: 14 mg/dL (ref 6–20)
CO2: 23 mmol/L (ref 22–32)
Calcium: 9.4 mg/dL (ref 8.9–10.3)
Chloride: 108 mmol/L (ref 98–111)
Creatinine, Ser: 1.29 mg/dL — ABNORMAL HIGH (ref 0.61–1.24)
GFR calc Af Amer: >60 mL/min (ref >60)
GFR calc non Af Amer: 60 mL/min — ABNORMAL LOW (ref >60)
Glucose, Bld: 116 mg/dL — ABNORMAL HIGH (ref 70–99)
Potassium: 3.4 mmol/L — ABNORMAL LOW (ref 3.5–5.1)
Sodium: 140 mmol/L (ref 135–145)

## 2019-04-23 LAB — CBC
HCT: 40.1 % (ref 39.0–52.0)
Hemoglobin: 12.4 g/dL — ABNORMAL LOW (ref 13.0–17.0)
MCH: 22.1 pg — ABNORMAL LOW (ref 26.0–34.0)
MCHC: 30.9 g/dL (ref 30.0–36.0)
MCV: 71.4 fL — ABNORMAL LOW (ref 80.0–100.0)
Platelets: 277 10*3/uL (ref 150–400)
RBC: 5.62 MIL/uL (ref 4.22–5.81)
RDW: 18.9 % — ABNORMAL HIGH (ref 11.5–15.5)
WBC: 8.5 10*3/uL (ref 4.0–10.5)
nRBC: 0 % (ref 0.0–0.2)

## 2019-04-23 LAB — OCCULT BLOOD X 1 CARD TO LAB, STOOL: Fecal Occult Bld: POSITIVE — AB

## 2019-04-23 LAB — TROPONIN I (HIGH SENSITIVITY)
Troponin I (High Sensitivity): 5 ng/L (ref <18)
Troponin I (High Sensitivity): 5 ng/L (ref <18)

## 2019-04-23 LAB — SARS CORONAVIRUS 2 AG (30 MIN TAT): SARS Coronavirus 2 Ag: NEGATIVE

## 2019-04-23 MED ORDER — LIDOCAINE HCL 1 % IJ SOLN
INTRAMUSCULAR | Status: AC
  Filled 2019-04-23: qty 20

## 2019-04-23 MED ORDER — TETANUS-DIPHTH-ACELL PERTUSSIS 5-2.5-18.5 LF-MCG/0.5 IM SUSP
0.5000 mL | Freq: Once | INTRAMUSCULAR | Status: AC
  Administered 2019-04-24: 0.5 mL via INTRAMUSCULAR
  Filled 2019-04-23: qty 0.5

## 2019-04-23 MED ORDER — IOHEXOL 300 MG/ML  SOLN
100.0000 mL | Freq: Once | INTRAMUSCULAR | Status: AC | PRN
  Administered 2019-04-23: 100 mL via INTRAVENOUS

## 2019-04-23 MED ORDER — PANTOPRAZOLE SODIUM 40 MG IV SOLR
40.0000 mg | Freq: Once | INTRAVENOUS | Status: AC
  Administered 2019-04-23: 40 mg via INTRAVENOUS
  Filled 2019-04-23: qty 40

## 2019-04-23 MED ORDER — SODIUM CHLORIDE 0.9% FLUSH
3.0000 mL | Freq: Once | INTRAVENOUS | Status: DC
  Filled 2019-04-23: qty 3

## 2019-04-23 MED ORDER — MORPHINE SULFATE (PF) 4 MG/ML IV SOLN
4.0000 mg | Freq: Once | INTRAVENOUS | Status: AC
  Administered 2019-04-23: 4 mg via INTRAVENOUS
  Filled 2019-04-23: qty 1

## 2019-04-23 MED ORDER — SODIUM CHLORIDE 0.9 % IV BOLUS
1000.0000 mL | Freq: Once | INTRAVENOUS | Status: AC
  Administered 2019-04-23: 1000 mL via INTRAVENOUS

## 2019-04-23 NOTE — ED Notes (Signed)
Patient transported to CT 

## 2019-04-23 NOTE — ED Notes (Signed)
ED Provider at bedside. 

## 2019-04-23 NOTE — ED Notes (Signed)
Pt reports that he has been to the restroom twice and both times he passed a lot of dark wine colored stool  Pt states he feels weak and that his lower abdomen is starting to hurt

## 2019-04-23 NOTE — ED Notes (Signed)
Pt up to restroom  Pt had liquid stool, color of red wine  Pt is c/o pain in lower abdomen  Provider informed

## 2019-04-23 NOTE — ED Triage Notes (Signed)
Pt c/o palpitations x 1 month-CP x 1 hour-rectal bleeding with BM x 1 today-NAD-steady gait

## 2019-04-23 NOTE — ED Notes (Signed)
Returned from CT.

## 2019-04-23 NOTE — ED Provider Notes (Addendum)
[facility] EMERGENCY DEPARTMENT Provider Note   CSN: [id number] Arrival date & time: 04/23/19  1605     History Chief Complaint  Patient presents with  . Palpitations  . Rectal Bleeding    [name] is a 59 y.o. male with past medical history of A. fib (on Xarelto), COPD, GERD, GI bleed, hypertension, sickle cell trait who presents for evaluation of rectal bleeding.  He states that this afternoon, he had a bowel movement and states that there was a large amount of bright red blood noted in the stool as well as when he wiped.  He states he did not have to strain and did not have any pain with bowel movement.  He states that he is currently on Xarelto and his last dose was earlier today.  He has a history of GI bleed that he states is from a arteriovenous malformation.  He states his last colonoscopy was about a year ago where he had precancerous polyps removed.  He states he called his GI doctor who prompted him to come the emergency department for further evaluation.  He also reports that over the last 3 to 4 weeks, he has had increasing palpitations and intermittent chest pain. He currently denies any chest pain.  He states that the palpitations are more concerning to him than the chest pain.  He states that this is occurs intermittently and mostly notes it at night.  He states that he feels like his heart is racing and he becomes lightheaded.  He also reports he has had intermittent chest pain over the last 4 weeks.  He describes this as sharp in nature.  He does state that sometimes he gets diaphoretic, nausea/vomiting.  He states that the chest pain occurs randomly and then will resolve on its own.  He does state that he feels like it is slightly worse with exertion.  He has not had any associated shortness of breath.  He currently denies any fevers, abdominal pain, urinary complaints.  The history is provided by the patient.       Past Medical History:  Diagnosis Date    . A-fib ([place])   . Chest pain    a. 11/2002 Cath: EF 68%, LM nl, LAD small, nl, LCX nl, RCA tortuous/nl;  b. 08/2011 St Echo: EF 60%, normal contractility, no scar/ischemia.  Marland Kitchen COPD (chronic obstructive pulmonary disease) ([place])   . Enlarged prostate   . GERD (gastroesophageal reflux disease)   . GI bleeding 08/2018  . Hypertension   . Sickle cell trait ([place])   . Sleep apnea   . SVT (supraventricular tachycardia) [facility])     Patient Active Problem List   Diagnosis Date Noted  . Acute GI bleeding 04/23/2019  . Cervical radiculopathy 03/08/2019  . Adenomatous polyp of transverse colon   . Gastrointestinal hemorrhage 08/25/2018  . CKD (chronic kidney disease) stage 3, GFR 30-59 ml/min 08/25/2018  . PAF (paroxysmal atrial fibrillation) ([place]) 08/25/2018  . Atypical chest pain 04/04/2017  . Abdominal pain 12/05/2013  . Diarrhea 12/05/2013  . Hypokalemia 12/05/2013  . Chest pain- atypical. Normal LHC 03/2013 03/10/2013  . HTN (hypertension) 03/10/2013  . Nonspecific abnormal electrocardiogram (ECG) (EKG) 03/10/2013    Past Surgical History:  Procedure Laterality Date  . ATRIAL FIBRILLATION ABLATION    . BIOPSY  08/27/2018   Procedure: BIOPSY;  Surgeon: Thornton Park, MD;  Location: [facility] ENDOSCOPY;  Service: Gastroenterology;;  . BIOPSY  08/28/2018   Procedure: BIOPSY;  Surgeon: Thornton Park,  MD;  Location: [facility];  Service: Gastroenterology;;  . COLONOSCOPY WITH PROPOFOL N/A 08/28/2018   Procedure: COLONOSCOPY WITH PROPOFOL;  Surgeon: Thornton Park, MD;  Location: [place];  Service: Gastroenterology;  Laterality: N/A;  . ENTEROSCOPY N/A 08/27/2018   Procedure: ENTEROSCOPY;  Surgeon: Thornton Park, MD;  Location: [place];  Service: Gastroenterology;  Laterality: N/A;  . GIVENS CAPSULE STUDY N/A 08/28/2018   Procedure: GIVENS CAPSULE STUDY;  Surgeon: Thornton Park, MD;  Location: [facility];  Service: Gastroenterology;  Laterality: N/A;  . LEFT HEART  CATHETERIZATION WITH CORONARY ANGIOGRAM N/A 03/11/2013   Procedure: LEFT HEART CATHETERIZATION WITH CORONARY ANGIOGRAM;  Surgeon: Jolaine Artist, MD;  Location: [facility] CATH LAB;  Service: Cardiovascular;  Laterality: N/A;  . POLYPECTOMY  08/28/2018   Procedure: POLYPECTOMY;  Surgeon: Thornton Park, MD;  Location: [place];  Service: Gastroenterology;;  . TONSILLECTOMY    . TRANSURETHRAL RESECTION OF PROSTATE         Family History  Problem Relation Age of Onset  . CAD Mother        died in her 39's - MI  . Multiple myeloma Brother   . Sickle cell anemia Father        died @ 19  . Gastric cancer Neg Hx     Social History   Tobacco Use  . Smoking status: Never Smoker  . Smokeless tobacco: Never Used  Substance Use Topics  . Alcohol use: No  . Drug use: Never    Home Medications Prior to Admission medications   Medication Sig Start Date End Date Taking? Authorizing Provider  amLODipine (NORVASC) 10 MG tablet Take 1 tablet (10 mg total) by mouth daily. 02/07/15  Yes Charlesetta Shanks, MD  carvedilol (COREG) 3.125 MG tablet  09/12/18  Yes [provider]  famotidine (PEPCID) 40 MG tablet  09/17/18  Yes [provider]  linaclotide (LINZESS) 290 MCG CAPS capsule Take 1 capsule (290 mcg total) by mouth daily before breakfast. 08/30/18  Yes Hongalgi, Lenis Dickinson, MD  losartan (COZAAR) 25 MG tablet  09/12/18  Yes [provider]  pantoprazole (PROTONIX) 40 MG tablet Take 40 mg by mouth daily. 07/28/14  Yes [provider]  polyethylene glycol (MIRALAX / GLYCOLAX) 17 g packet Take 17 g by mouth 2 (two) times daily. 08/29/18  Yes Hongalgi, Lenis Dickinson, MD  sildenafil (REVATIO) 20 MG tablet TAKE 3-5 TABLETS BY MOUTH AS DIRECTED 11/25/18  Yes [provider]  sotalol (BETAPACE) 80 MG tablet Take 40 mg by mouth 2 (two) times daily.   Yes [provider]  triamterene-hydrochlorothiazide (DYAZIDE) 37.5-25 MG capsule Take 1 capsule by mouth every  morning. 01/27/19  Yes [provider]  XARELTO 20 MG TABS tablet Take 20 mg by mouth daily. 05/03/18  Yes [provider]  cetirizine (ZYRTEC) 10 MG tablet Take 10 mg by mouth daily.    [provider]  methocarbamol (ROBAXIN) 500 MG tablet Take 1 tablet (500 mg total) by mouth every 8 (eight) hours as needed for muscle spasms. 03/04/19   Lucrezia Starch, MD  Multiple Vitamins-Minerals (MULTIVITAMIN WITH MINERALS) tablet Take 1 tablet by mouth daily.    [provider]  nitroGLYCERIN (NITROSTAT) 0.4 MG SL tablet Place 1 tablet (0.4 mg total) under the tongue every 5 (five) minutes as needed for chest pain. 03/12/13   Thurnell Lose, MD  ondansetron (ZOFRAN) 8 MG tablet Take 8 mg by mouth every 8 (eight) hours as needed. 10/20/18   [provider]  oxyCODONE-acetaminophen (PERCOCET) 5-325 MG tablet Take 1 tablet by mouth every 4 (four) hours as needed for severe pain. 02/26/19   Molpus, John, MD  SUMAtriptan (IMITREX) 50 MG tablet Take 50-100 mg by mouth once as needed for migraine. 05/03/16   [provider]  tiotropium (SPIRIVA) 18 MCG inhalation capsule Place 18 mcg into inhaler and inhale daily.    [provider]  furosemide (LASIX) 20 MG tablet Take 20 mg by mouth daily. 04/12/18 02/26/19  [provider]  gabapentin (NEURONTIN) 300 MG capsule Take 300 mg by mouth at bedtime.  02/26/19  [provider]    Allergies    Patient has no known allergies.  Review of Systems   Review of Systems  Constitutional: Negative for fever.  Respiratory: Negative for cough and shortness of breath.   Cardiovascular: Positive for chest pain and palpitations.  Gastrointestinal: Positive for blood in stool. Negative for abdominal pain, nausea and vomiting.  Genitourinary: Negative for dysuria and hematuria.  Neurological: Negative for headaches.  All other systems reviewed and are negative.   Physical Exam Updated Vital  Signs BP 98/78 (BP Location: Left Arm)   Pulse 76   Temp 98.1 F (36.7 C) (Oral)   Resp 16   Ht _0  (1.803 m)   Wt 98 kg   SpO2 98%   BMI 30.13 kg/m   Physical Exam Vitals and nursing note reviewed. Exam conducted with a chaperone present.  Constitutional:      Appearance: Normal appearance. He is well-developed.  HENT:     Head: Normocephalic and atraumatic.  Eyes:     General: Lids are normal.     Conjunctiva/sclera: Conjunctivae normal.     Pupils: Pupils are equal, round, and reactive to light.  Cardiovascular:     Rate and Rhythm: Normal rate and regular rhythm.     Pulses: Normal pulses.     Heart sounds: Normal heart sounds. No murmur. No friction rub. No gallop.   Pulmonary:     Effort: Pulmonary effort is normal.     Breath sounds: Normal breath sounds.     Comments: Lungs clear to auscultation bilaterally.  Symmetric chest rise.  No wheezing, rales, rhonchi. Abdominal:     Palpations: Abdomen is soft. Abdomen is not rigid.     Tenderness: There is no abdominal tenderness. There is no guarding.     Comments: Abdomen is soft, non-distended, non-tender. No rigidity, No guarding. No peritoneal signs.  Genitourinary:    Rectum: Guaiac result positive. No external hemorrhoid.     Comments: The exam was performed with a chaperone present.  Small amount of bright red blood in digital rectal exam. Musculoskeletal:        General: Normal range of motion.     Cervical back: Full passive range of motion without pain.  Skin:    General: Skin is warm and dry.     Capillary Refill: Capillary refill takes less than 2 seconds.  Neurological:     Mental Status: He is alert and oriented to person, place, and time.  Psychiatric:        Speech: Speech normal.     ED Results / Procedures / Treatments   Labs (all labs ordered are listed, but only abnormal results are displayed) Labs Reviewed  BASIC METABOLIC PANEL - Abnormal; Notable for the following components:       Result Value   Potassium 3.4 (*)    Glucose, Bld 116 (*)  Creatinine, Ser 1.29 (*)    GFR calc non Af Amer 60 (*)    All other components within normal limits  CBC - Abnormal; Notable for the following components:   Hemoglobin 12.4 (*)    MCV 71.4 (*)    MCH 22.1 (*)    RDW 18.9 (*)    All other components within normal limits  OCCULT BLOOD X 1 CARD TO LAB, STOOL - Abnormal; Notable for the following components:   Fecal Occult Bld POSITIVE (*)    All other components within normal limits  SARS CORONAVIRUS 2 AG (30 MIN TAT)  TROPONIN I (HIGH SENSITIVITY)  TROPONIN I (HIGH SENSITIVITY)    EKG None  Radiology DG Chest 2 View  Result Date: 04/23/2019 CLINICAL DATA:  Chest pain EXAM: CHEST - 2 VIEW COMPARISON:  08/26/2018 FINDINGS: The heart size and mediastinal contours are within normal limits. Both lungs are clear. Diffuse osteophytosis of the thoracic spine. IMPRESSION: No active cardiopulmonary disease. Electronically Signed   By: Donavan Foil M.D.   On: 04/23/2019 17:16   CT ABDOMEN PELVIS W CONTRAST  Result Date: 04/23/2019 CLINICAL DATA:  Rectal bleeding EXAM: CT ABDOMEN AND PELVIS WITH CONTRAST TECHNIQUE: Multidetector CT imaging of the abdomen and pelvis was performed using the standard protocol following bolus administration of intravenous contrast. CONTRAST:  155m OMNIPAQUE IOHEXOL 300 MG/ML  SOLN COMPARISON:  02/26/2019 FINDINGS: Lower chest: Lung bases are clear. No effusions. Heart is normal size. Hepatobiliary: Diffuse low-density throughout the liver compatible with fatty infiltration. No focal abnormality. Gallbladder unremarkable. Pancreas: No focal abnormality or ductal dilatation. Spleen: No focal abnormality.  Normal size. Adrenals/Urinary Tract: No adrenal abnormality. No focal renal abnormality. No stones or hydronephrosis. Urinary bladder is unremarkable. Stomach/Bowel: Diffuse colonic diverticulosis. This is most pronounced in the descending colon and sigmoid  colon. No active diverticulitis. Normal appendix. Stomach and small bowel decompressed, unremarkable. Vascular/Lymphatic: No evidence of aneurysm or adenopathy. Reproductive: No visible focal abnormality. Other: No free fluid or free air. Musculoskeletal: No acute bony abnormality. Degenerative disc and facet disease in the lumbar spine. IMPRESSION: Diffuse colonic diverticulosis, most pronounced in the left colon. No active diverticulitis. Fatty infiltration of the liver. Electronically Signed   By: KRolm BaptiseM.D.   On: 04/23/2019 19:47    Procedures ..Marland Kitchenaceration Repair  Date/Time: 04/24/2019 12:24 AM Performed by: LVolanda Napoleon PA-C Authorized by: LVolanda Napoleon PA-C   Consent:    Consent obtained:  Verbal   Consent given by:  Patient   Risks discussed:  Infection, need for additional repair, pain, poor cosmetic result and poor wound healing   Alternatives discussed:  No treatment and delayed treatment Universal protocol:    Procedure explained and questions answered to patient or proxy's satisfaction: yes     Relevant documents present and verified: yes     Test results available and properly labeled: yes     Imaging studies available: yes     Required blood products, implants, devices, and special equipment available: yes     Site/side marked: yes     Immediately prior to procedure, a time out was called: yes     Patient identity confirmed:  Verbally with patient Anesthesia (see MAR for exact dosages):    Anesthesia method:  Local infiltration   Local anesthetic:  Lidocaine 1% w/o epi Laceration details:    Location:  Scalp   Scalp location:  Frontal   Length (cm):  3 Repair type:    Repair type:  Simple  Pre-procedure details:    Preparation:  Patient was prepped and draped in usual sterile fashion Exploration:    Hemostasis achieved with:  Direct pressure   Wound exploration: wound explored through full range of motion     Wound extent: no foreign bodies/material  noted   Treatment:    Area cleansed with:  Betadine   Amount of cleaning:  Standard   Irrigation solution:  Sterile saline   Irrigation method:  Syringe   Visualized foreign bodies/material removed: no   Skin repair:    Repair method:  Sutures   Suture size:  5-0   Suture material:  Prolene   Suture technique:  Simple interrupted   Number of sutures:  5 Approximation:    Approximation:  Close Post-procedure details:    Dressing:  Antibiotic ointment and non-adherent dressing   Patient tolerance of procedure:  Tolerated well, no immediate complications   (including critical care time)  Medications Ordered in ED Medications  sodium chloride flush (NS) 0.9 % injection 3 mL (3 mLs Intravenous Not Given 04/23/19 1729)  pantoprazole (PROTONIX) injection 40 mg (40 mg Intravenous Given 04/23/19 1746)  iohexol (OMNIPAQUE) 300 MG/ML solution 100 mL (100 mLs Intravenous Contrast Given 04/23/19 1930)  morphine 4 MG/ML injection 4 mg (4 mg Intravenous Given 04/23/19 2007)    ED Course  I have reviewed the triage vital signs and the nursing notes.  Pertinent labs & imaging results that were available during my care of the patient were reviewed by me and considered in my medical decision making (see chart for details).    MDM Rules/Calculators/A&P                      59 year old male with past medical history of A. fib (on Xarelto), GI bleed, who presents for evaluation of rectal bleeding that began today.  Also reports a month-long history of chest pain, palpitations.  On initial ED arrival, he is afebrile, nontoxic-appearing.  Vital signs are stable.  Benign abdominal exam.  On rectal exam, he does have evidence of bright red blood noted on DRE.  Concern for hemorrhoids versus diverticulitis versus GI bleed.  Plan to check labs, imaging.  Additionally, given history of palpitations no chest pain, will plan for EKG, chest x-ray.  Fecal occult is positive.  His troponin is negative.  His BMP  shows potassium 3.4.  BUN of 14, creatinine 1.29.  CBC shows no leukocytosis.  Hemoglobin is 12.4.  Review of his records show that he has fluctuated between 72 and 12.  Chest x-ray negative for any infectious etiology.  CT scan shows diffuse colonic diverticulosis, most pronounced in the left colon.  No active diverticulitis.  RN informing that he had 3 more bowel movements with large maroon-colored stools.  On my evaluation, patient appears more comfortable.  He does feel little dyspneic.  He also has some new lower abdominal pain.  Given that he has had 3 episodes while here in the emergency department as well as he is high risk given on Xarelto, feel that he needs admission for serial hemoglobins and further monitoring.  Discussed with hospitalist.  Will plan to admit.  He requested I discussed with GI.  Discussed with Dr. Therisa Doyne (gastroenterology).  She agrees with plan for admission.  Recommends holding Xarelto.  Recommends H&H every 8 hours and plan to transfuse if he gets below 7.  She is okay with daily PPI but does not wish to have them put on a  Protonix drip.  She will plan to see him.  12:01 AM: I was called to the bathroom immediately.  Patient had stated he needed to have another bowel movement and wanted privacy.  Patient fell from toilet.  Unclear if he had a syncopal/vagal.  On arrival, there was blood in the toilet and on the floor as well as a laceration to his head.  Patient did have a pulse.  He was immediately moved into the room with reassuring vital signs.  He was able to respond but was slightly groggy.  He is on Xarelto so will plan for CT head, CT C-spine.  Additionally, he has a 3 cm linear laceration noted to his left scalp.  I updated hospitalist on his fall.  His laceration was repaired as documented above.  CT head does not show any obvious intracranial hemorrhage.  [facility] will take patient for admission.  Portions of this note were generated with Theatre manager. Dictation errors may occur despite best attempts at proofreading.   Final Clinical Impression(s) / ED Diagnoses Final diagnoses:  Gastrointestinal hemorrhage, unspecified gastrointestinal hemorrhage type  Palpitations  Chest pain, unspecified type    Rx / DC Orders ED Discharge Orders    None       Volanda Napoleon, PA-C 04/23/19 2328    Volanda Napoleon, PA-C 04/24/19 Boone Master, MD 04/28/19 [phone]

## 2019-04-23 NOTE — ED Notes (Signed)
Pt ambulated to RR unassisted 

## 2019-04-24 ENCOUNTER — Encounter (HOSPITAL_COMMUNITY): Payer: Self-pay | Admitting: Family Medicine

## 2019-04-24 DIAGNOSIS — D62 Acute posthemorrhagic anemia: Secondary | ICD-10-CM | POA: Diagnosis present

## 2019-04-24 DIAGNOSIS — I471 Supraventricular tachycardia: Secondary | ICD-10-CM | POA: Diagnosis present

## 2019-04-24 DIAGNOSIS — W1811XA Fall from or off toilet without subsequent striking against object, initial encounter: Secondary | ICD-10-CM | POA: Diagnosis present

## 2019-04-24 DIAGNOSIS — Z20822 Contact with and (suspected) exposure to covid-19: Secondary | ICD-10-CM | POA: Diagnosis present

## 2019-04-24 DIAGNOSIS — I480 Paroxysmal atrial fibrillation: Secondary | ICD-10-CM | POA: Diagnosis present

## 2019-04-24 DIAGNOSIS — R55 Syncope and collapse: Secondary | ICD-10-CM | POA: Diagnosis present

## 2019-04-24 DIAGNOSIS — Y92238 Other place in hospital as the place of occurrence of the external cause: Secondary | ICD-10-CM | POA: Diagnosis present

## 2019-04-24 DIAGNOSIS — K581 Irritable bowel syndrome with constipation: Secondary | ICD-10-CM | POA: Diagnosis present

## 2019-04-24 DIAGNOSIS — K219 Gastro-esophageal reflux disease without esophagitis: Secondary | ICD-10-CM | POA: Diagnosis present

## 2019-04-24 DIAGNOSIS — Z8673 Personal history of transient ischemic attack (TIA), and cerebral infarction without residual deficits: Secondary | ICD-10-CM | POA: Diagnosis not present

## 2019-04-24 DIAGNOSIS — R079 Chest pain, unspecified: Secondary | ICD-10-CM | POA: Diagnosis present

## 2019-04-24 DIAGNOSIS — D509 Iron deficiency anemia, unspecified: Secondary | ICD-10-CM | POA: Diagnosis present

## 2019-04-24 DIAGNOSIS — N400 Enlarged prostate without lower urinary tract symptoms: Secondary | ICD-10-CM | POA: Diagnosis present

## 2019-04-24 DIAGNOSIS — Z7901 Long term (current) use of anticoagulants: Secondary | ICD-10-CM

## 2019-04-24 DIAGNOSIS — I129 Hypertensive chronic kidney disease with stage 1 through stage 4 chronic kidney disease, or unspecified chronic kidney disease: Secondary | ICD-10-CM | POA: Diagnosis present

## 2019-04-24 DIAGNOSIS — J449 Chronic obstructive pulmonary disease, unspecified: Secondary | ICD-10-CM | POA: Diagnosis present

## 2019-04-24 DIAGNOSIS — E869 Volume depletion, unspecified: Secondary | ICD-10-CM | POA: Diagnosis present

## 2019-04-24 DIAGNOSIS — K921 Melena: Secondary | ICD-10-CM | POA: Diagnosis present

## 2019-04-24 DIAGNOSIS — K922 Gastrointestinal hemorrhage, unspecified: Secondary | ICD-10-CM

## 2019-04-24 DIAGNOSIS — Z9119 Patient's noncompliance with other medical treatment and regimen: Secondary | ICD-10-CM | POA: Diagnosis not present

## 2019-04-24 DIAGNOSIS — E876 Hypokalemia: Secondary | ICD-10-CM | POA: Diagnosis present

## 2019-04-24 DIAGNOSIS — Q2730 Arteriovenous malformation, site unspecified: Secondary | ICD-10-CM | POA: Diagnosis not present

## 2019-04-24 DIAGNOSIS — K5731 Diverticulosis of large intestine without perforation or abscess with bleeding: Secondary | ICD-10-CM | POA: Diagnosis present

## 2019-04-24 DIAGNOSIS — G4733 Obstructive sleep apnea (adult) (pediatric): Secondary | ICD-10-CM | POA: Diagnosis present

## 2019-04-24 DIAGNOSIS — D573 Sickle-cell trait: Secondary | ICD-10-CM | POA: Diagnosis present

## 2019-04-24 DIAGNOSIS — I422 Other hypertrophic cardiomyopathy: Secondary | ICD-10-CM | POA: Diagnosis not present

## 2019-04-24 DIAGNOSIS — D123 Benign neoplasm of transverse colon: Secondary | ICD-10-CM | POA: Diagnosis present

## 2019-04-24 DIAGNOSIS — Z8249 Family history of ischemic heart disease and other diseases of the circulatory system: Secondary | ICD-10-CM | POA: Diagnosis not present

## 2019-04-24 DIAGNOSIS — K5520 Angiodysplasia of colon without hemorrhage: Secondary | ICD-10-CM | POA: Diagnosis not present

## 2019-04-24 DIAGNOSIS — Z23 Encounter for immunization: Secondary | ICD-10-CM | POA: Diagnosis not present

## 2019-04-24 LAB — COMPREHENSIVE METABOLIC PANEL
ALT: 19 U/L (ref 0–44)
AST: 22 U/L (ref 15–41)
Albumin: 3.5 g/dL (ref 3.5–5.0)
Alkaline Phosphatase: 62 U/L (ref 38–126)
Anion gap: 8 (ref 5–15)
BUN: 15 mg/dL (ref 6–20)
CO2: 23 mmol/L (ref 22–32)
Calcium: 8.5 mg/dL — ABNORMAL LOW (ref 8.9–10.3)
Chloride: 110 mmol/L (ref 98–111)
Creatinine, Ser: 1.23 mg/dL (ref 0.61–1.24)
GFR calc Af Amer: >60 mL/min (ref >60)
GFR calc non Af Amer: >60 mL/min (ref >60)
Glucose, Bld: 119 mg/dL — ABNORMAL HIGH (ref 70–99)
Potassium: 3.7 mmol/L (ref 3.5–5.1)
Sodium: 141 mmol/L (ref 135–145)
Total Bilirubin: 0.8 mg/dL (ref 0.3–1.2)
Total Protein: 6.7 g/dL (ref 6.5–8.1)

## 2019-04-24 LAB — HEMATOCRIT: HCT: 31.4 % — ABNORMAL LOW (ref 39.0–52.0)

## 2019-04-24 LAB — HEMOGLOBIN AND HEMATOCRIT, BLOOD
HCT: 29.8 % — ABNORMAL LOW (ref 39.0–52.0)
HCT: 29 % — ABNORMAL LOW (ref 39.0–52.0)
HCT: 25.5 % — ABNORMAL LOW (ref 39.0–52.0)
Hemoglobin: 9.3 g/dL — ABNORMAL LOW (ref 13.0–17.0)
Hemoglobin: 9 g/dL — ABNORMAL LOW (ref 13.0–17.0)
Hemoglobin: 7.8 g/dL — ABNORMAL LOW (ref 13.0–17.0)

## 2019-04-24 LAB — CBC
HCT: 33.5 % — ABNORMAL LOW (ref 39.0–52.0)
Hemoglobin: 10.2 g/dL — ABNORMAL LOW (ref 13.0–17.0)
MCH: 22 pg — ABNORMAL LOW (ref 26.0–34.0)
MCHC: 30.4 g/dL (ref 30.0–36.0)
MCV: 72.2 fL — ABNORMAL LOW (ref 80.0–100.0)
Platelets: 246 10*3/uL (ref 150–400)
RBC: 4.64 MIL/uL (ref 4.22–5.81)
RDW: 18.3 % — ABNORMAL HIGH (ref 11.5–15.5)
WBC: 7.8 10*3/uL (ref 4.0–10.5)
nRBC: 0 % (ref 0.0–0.2)

## 2019-04-24 LAB — HEMOGLOBIN: Hemoglobin: 9.6 g/dL — ABNORMAL LOW (ref 13.0–17.0)

## 2019-04-24 LAB — SARS CORONAVIRUS 2 (TAT 6-24 HRS): SARS Coronavirus 2: NEGATIVE

## 2019-04-24 LAB — MAGNESIUM: Magnesium: 2 mg/dL (ref 1.7–2.4)

## 2019-04-24 LAB — ABO/RH: ABO/RH(D): O POS

## 2019-04-24 MED ORDER — ACETAMINOPHEN 650 MG RE SUPP: 650.0000 mg | Freq: Four times a day (QID) | RECTAL | Status: DC | PRN

## 2019-04-24 MED ORDER — ONDANSETRON HCL 4 MG/2ML IJ SOLN: 4.0000 mg | Freq: Four times a day (QID) | INTRAMUSCULAR | Status: DC | PRN

## 2019-04-24 MED ORDER — SODIUM CHLORIDE 0.9% FLUSH
3.0000 mL | Freq: Two times a day (BID) | INTRAVENOUS | Status: DC
  Administered 2019-04-24 – 2019-04-28 (×8): 3 mL via INTRAVENOUS

## 2019-04-24 MED ORDER — SODIUM CHLORIDE 0.9 % IV SOLN: INTRAVENOUS | Status: DC

## 2019-04-24 MED ORDER — ONDANSETRON HCL 4 MG PO TABS: 4.0000 mg | ORAL_TABLET | Freq: Four times a day (QID) | ORAL | Status: DC | PRN

## 2019-04-24 MED ORDER — MORPHINE SULFATE (PF) 2 MG/ML IV SOLN
1.0000 mg | INTRAVENOUS | Status: DC | PRN
  Administered 2019-04-24: 2 mg via INTRAVENOUS
  Administered 2019-04-26 – 2019-04-27 (×4): 1 mg via INTRAVENOUS
  Administered 2019-04-28: 2 mg via INTRAVENOUS
  Filled 2019-04-24 (×6): qty 1

## 2019-04-24 MED ORDER — ACETAMINOPHEN 325 MG PO TABS
650.0000 mg | ORAL_TABLET | Freq: Four times a day (QID) | ORAL | Status: DC | PRN
  Administered 2019-04-24 – 2019-04-29 (×4): 650 mg via ORAL
  Filled 2019-04-24 (×4): qty 2

## 2019-04-24 MED ORDER — UMECLIDINIUM BROMIDE 62.5 MCG/INH IN AEPB
1.0000 | INHALATION_SPRAY | Freq: Every day | RESPIRATORY_TRACT | Status: DC
  Administered 2019-04-24 – 2019-04-29 (×3): 1 via RESPIRATORY_TRACT
  Filled 2019-04-24: qty 7

## 2019-04-24 MED ORDER — ALBUTEROL SULFATE HFA 108 (90 BASE) MCG/ACT IN AERS: 2.0000 | INHALATION_SPRAY | Freq: Four times a day (QID) | RESPIRATORY_TRACT | Status: DC | PRN

## 2019-04-24 MED ORDER — POTASSIUM CHLORIDE IN NACL 20-0.9 MEQ/L-% IV SOLN
INTRAVENOUS | Status: AC
  Filled 2019-04-24 (×2): qty 1000

## 2019-04-24 MED ORDER — FLUTICASONE PROPIONATE 50 MCG/ACT NA SUSP
1.0000 | Freq: Every day | NASAL | Status: DC | PRN
  Filled 2019-04-24: qty 16

## 2019-04-24 MED ORDER — MOMETASONE FURO-FORMOTEROL FUM 200-5 MCG/ACT IN AERO
2.0000 | INHALATION_SPRAY | Freq: Two times a day (BID) | RESPIRATORY_TRACT | Status: DC
  Administered 2019-04-26 – 2019-04-29 (×6): 2 via RESPIRATORY_TRACT
  Filled 2019-04-24: qty 8.8

## 2019-04-24 MED ORDER — PANTOPRAZOLE SODIUM 40 MG PO TBEC
40.0000 mg | DELAYED_RELEASE_TABLET | Freq: Every day | ORAL | Status: DC
  Administered 2019-04-24 – 2019-04-29 (×6): 40 mg via ORAL
  Filled 2019-04-24 (×6): qty 1

## 2019-04-24 MED ORDER — ALBUTEROL SULFATE (2.5 MG/3ML) 0.083% IN NEBU: 2.5000 mg | INHALATION_SOLUTION | Freq: Four times a day (QID) | RESPIRATORY_TRACT | Status: DC | PRN

## 2019-04-24 NOTE — H&P (Signed)
History and Physical    [name] SEG:[id number] DOB: 1960/11/19 DOA: 04/23/2019  PCP: [name], MD   Patient coming from: Home   Chief Complaint: Rectal bleeding   HPI: [name] is a 59 y.o. male with medical history significant for PAF on Xarelto, hypertension, COPD, now presenting to the emergency department for evaluation of rectal bleeding.  Patient reports he been in his usual state of health until approximately 2:30 PM on 04/23/2019 when he had a sudden urge to move his bowels, but saw mainly just red blood in the toilet bowl.  He was not having any abdominal pain at that time and denies any fevers, chills, or vomiting.  He has had some lightheadedness and malaise this evening and went to [facility] for evaluation.  He denies any fevers or shortness of breath.  Denies any leg swelling.  Had an episode of epistaxis recently that resolved on its own but no other bleeding or bruising episodes.  He has had melena before and was hospitalized for this in May, but denies any prior symptoms similar to his current presenting complaints.  He was admitted in May 2020 with melena and had extensive GI evaluation, detailed as follows:   Upper endoscopy in May 2020 with 3 nonbleeding lymphangiectasias in the duodenum that were biopsied (no significant pathologic findings).  Colonoscopy in May 2020 with diverticulosis involving descending and sigmoid colon, transverse colon polyp that was removed with cold snare (tubular adenoma with no high-grade dysplasia or malignancy), and erythematous mucosa in the distal sigmoid colon was biopsied (mucosal erosions and hyperplastic changes without active inflammation, high-grade dysplasia, or malignancy).  Capsule endoscopy in May 2020 with mild duodenitis and no abnormalities noted distal to the duodenum.   [facility] ED Course: Upon arrival to the ED, patient is found to be afebrile, saturating well on room air, and with normal heart  rate and blood pressure.  EKG features sinus rhythm with LAFB.  Chest x-ray is negative for acute cardiopulmonary disease.  CT the abdomen and pelvis is notable for diffuse diverticulosis without active diverticulitis.  Chemistry panel features a potassium of 3.4 and creatinine 1.29, lower than priors.  BUN was normal at 14.  Initial CBC with hemoglobin 12.4, down to 10.2 five and 1/2 hours later.  There was gross blood on DRE.  High-sensitivity troponin was normal x2.  Patient had multiple episodes of hematochezia in the ED, 1 of which was followed immediately by syncope, resulting in the patient hitting his head and suffering a scalp laceration.  Noncontrast head CT was negative for acute intracranial abnormality and there was no acute fracture or subluxation on C-spine CT.  Laceration was repaired, Tdap updated, 1 L normal saline given, and patient was also treated with morphine and 40 mg IV Protonix.  Gastroenterology was consulted by the ED physician and recommended medical admission, and recommended holding Xarelto, following serial H&H, and transfusing as needed.  Patient was transferred to [facility] for admission.  Review of Systems:  All other systems reviewed and apart from HPI, are negative.  Past Medical History:  Diagnosis Date  . A-fib ([place])   . Chest pain    a. 11/2002 Cath: EF 68%, LM nl, LAD small, nl, LCX nl, RCA tortuous/nl;  b. 08/2011 St Echo: EF 60%, normal contractility, no scar/ischemia.  Marland Kitchen COPD (chronic obstructive pulmonary disease) ([place])   . Enlarged prostate   . GERD (gastroesophageal reflux disease)   . GI bleeding 08/2018  .  Hypertension   . Sickle cell trait ([place])   . Sleep apnea   . SVT (supraventricular tachycardia) ([facility])     Past Surgical History:  Procedure Laterality Date  . ATRIAL FIBRILLATION ABLATION    . BIOPSY  08/27/2018   Procedure: BIOPSY;  Surgeon: Thornton Park, MD;  Location: [place];  Service: Gastroenterology;;  . BIOPSY  08/28/2018    Procedure: BIOPSY;  Surgeon: Thornton Park, MD;  Location: [place];  Service: Gastroenterology;;  . COLONOSCOPY WITH PROPOFOL N/A 08/28/2018   Procedure: COLONOSCOPY WITH PROPOFOL;  Surgeon: Thornton Park, MD;  Location: [place];  Service: Gastroenterology;  Laterality: N/A;  . ENTEROSCOPY N/A 08/27/2018   Procedure: ENTEROSCOPY;  Surgeon: Thornton Park, MD;  Location: [place];  Service: Gastroenterology;  Laterality: N/A;  . GIVENS CAPSULE STUDY N/A 08/28/2018   Procedure: GIVENS CAPSULE STUDY;  Surgeon: Thornton Park, MD;  Location: [place];  Service: Gastroenterology;  Laterality: N/A;  . LEFT HEART CATHETERIZATION WITH CORONARY ANGIOGRAM N/A 03/11/2013   Procedure: LEFT HEART CATHETERIZATION WITH CORONARY ANGIOGRAM;  Surgeon: Jolaine Artist, MD;  Location: [facility] CATH LAB;  Service: Cardiovascular;  Laterality: N/A;  . POLYPECTOMY  08/28/2018   Procedure: POLYPECTOMY;  Surgeon: Thornton Park, MD;  Location: [place];  Service: Gastroenterology;;  . TONSILLECTOMY    . TRANSURETHRAL RESECTION OF PROSTATE       reports that he has never smoked. He has never used smokeless tobacco. He reports that he does not drink alcohol or use drugs.  No Known Allergies  Family History  Problem Relation Age of Onset  . CAD Mother        died in her 82's - MI  . Multiple myeloma Brother   . Sickle cell anemia Father        died @ 85  . Gastric cancer Neg Hx      Prior to Admission medications   Medication Sig Start Date End Date Taking? Authorizing Provider  amLODipine (NORVASC) 10 MG tablet Take 1 tablet (10 mg total) by mouth daily. 02/07/15  Yes Charlesetta Shanks, MD  carvedilol (COREG) 3.125 MG tablet  09/12/18  Yes [provider]  famotidine (PEPCID) 40 MG tablet  09/17/18  Yes [provider]  linaclotide (LINZESS) 290 MCG CAPS capsule Take 1 capsule (290 mcg total) by mouth daily before breakfast. 08/30/18  Yes Hongalgi, Lenis Dickinson, MD    losartan (COZAAR) 25 MG tablet  09/12/18  Yes [provider]  pantoprazole (PROTONIX) 40 MG tablet Take 40 mg by mouth daily. 07/28/14  Yes [provider]  polyethylene glycol (MIRALAX / GLYCOLAX) 17 g packet Take 17 g by mouth 2 (two) times daily. 08/29/18  Yes Hongalgi, Lenis Dickinson, MD  sildenafil (REVATIO) 20 MG tablet TAKE 3-5 TABLETS BY MOUTH AS DIRECTED 11/25/18  Yes [provider]  sotalol (BETAPACE) 80 MG tablet Take 40 mg by mouth 2 (two) times daily.   Yes [provider]  triamterene-hydrochlorothiazide (DYAZIDE) 37.5-25 MG capsule Take 1 capsule by mouth every morning. 01/27/19  Yes [provider]  XARELTO 20 MG TABS tablet Take 20 mg by mouth daily. 05/03/18  Yes [provider]  cetirizine (ZYRTEC) 10 MG tablet Take 10 mg by mouth daily.    [provider]  methocarbamol (ROBAXIN) 500 MG tablet Take 1 tablet (500 mg total) by mouth every 8 (eight) hours as needed for muscle spasms. 03/04/19   Lucrezia Starch, MD  Multiple Vitamins-Minerals (MULTIVITAMIN WITH MINERALS) tablet Take 1  tablet by mouth daily.    [provider]  nitroGLYCERIN (NITROSTAT) 0.4 MG SL tablet Place 1 tablet (0.4 mg total) under the tongue every 5 (five) minutes as needed for chest pain. 03/12/13   Thurnell Lose, MD  ondansetron (ZOFRAN) 8 MG tablet Take 8 mg by mouth every 8 (eight) hours as needed. 10/20/18   [provider]  oxyCODONE-acetaminophen (PERCOCET) 5-325 MG tablet Take 1 tablet by mouth every 4 (four) hours as needed for severe pain. 02/26/19   Molpus, John, MD  SUMAtriptan (IMITREX) 50 MG tablet Take 50-100 mg by mouth once as needed for migraine. 05/03/16   [provider]  tiotropium (SPIRIVA) 18 MCG inhalation capsule Place 18 mcg into inhaler and inhale daily.    [provider]  furosemide (LASIX) 20 MG tablet Take 20 mg by mouth daily. 04/12/18 02/26/19  [provider]  gabapentin  (NEURONTIN) 300 MG capsule Take 300 mg by mouth at bedtime.  02/26/19  [provider]    Physical Exam: Vitals:   04/23/19 1946 04/23/19 2148 04/24/19 0026 04/24/19 0108  BP: (!) 151/104 98/78 (!) 125/99 (!) 118/96  Pulse: 68 76 68 75  Resp: '18 16 18 18  '$ Temp: 98.8 F (37.1 C) 98.1 F (36.7 C)  98.1 F (36.7 C)  TempSrc: Oral Oral  Oral  SpO2: 100% 98% 100% 100%  Weight:      Height:        Constitutional: NAD, calm  Eyes: PERTLA, lids and conjunctivae normal ENMT: Mucous membranes are moist. Posterior pharynx clear of any exudate or lesions.   Neck: normal, supple, no masses, no thyromegaly Respiratory:  no wheezing, no crackles. Normal respiratory effort. No accessory muscle use.  Cardiovascular: S1 & S2 heard, regular rate and rhythm. No extremity edema.   Abdomen: No distension, soft. Bowel sounds active.  Musculoskeletal: no clubbing / cyanosis. No joint deformity upper and lower extremities.    Skin: no significant rashes, lesions, ulcers. Warm, dry, well-perfused. Neurologic: No facial weakness, dysarthria, or gross hearing deficit. Sensation intact. Moving all extremities.  Psychiatric:  Alert, answering questions appropriately. Pleasant, cooperative.    Labs on Admission: I have personally reviewed following labs and imaging studies  CBC: Recent Labs  Lab 04/23/19 1643 04/24/19 0013  WBC 8.5 7.8  HGB 12.4* 10.2*  HCT 40.1 33.5*  MCV 71.4* 72.2*  PLT 277 283   Basic Metabolic Panel: Recent Labs  Lab 04/23/19 1643  NA 140  K 3.4*  CL 108  CO2 23  GLUCOSE 116*  BUN 14  CREATININE 1.29*  CALCIUM 9.4   GFR: Estimated Creatinine Clearance: 72.7 mL/min (A) (by C-G formula based on SCr of 1.29 mg/dL (H)). Liver Function Tests: No results for input(s): AST, ALT, ALKPHOS, BILITOT, PROT, ALBUMIN in the last 168 hours. No results for input(s): LIPASE, AMYLASE in the last 168 hours. No results for input(s): AMMONIA in the last 168  hours. Coagulation Profile: No results for input(s): INR, PROTIME in the last 168 hours. Cardiac Enzymes: No results for input(s): CKTOTAL, CKMB, CKMBINDEX, TROPONINI in the last 168 hours. BNP (last 3 results) No results for input(s): PROBNP in the last 8760 hours. HbA1C: No results for input(s): HGBA1C in the last 72 hours. CBG: No results for input(s): GLUCAP in the last 168 hours. Lipid Profile: No results for input(s): CHOL, HDL, LDLCALC, TRIG, CHOLHDL, LDLDIRECT in the last 72 hours. Thyroid Function Tests: No results for input(s): TSH, T4TOTAL, FREET4, T3FREE, THYROIDAB in the  last 72 hours. Anemia Panel: No results for input(s): VITAMINB12, FOLATE, FERRITIN, TIBC, IRON, RETICCTPCT in the last 72 hours. Urine analysis:    Component Value Date/Time   COLORURINE YELLOW 02/26/2019 0205   APPEARANCEUR CLEAR 02/26/2019 0205   LABSPEC 1.025 02/26/2019 0205   PHURINE 6.0 02/26/2019 0205   GLUCOSEU NEGATIVE 02/26/2019 0205   HGBUR SMALL (A) 02/26/2019 0205   BILIRUBINUR NEGATIVE 02/26/2019 0205   KETONESUR NEGATIVE 02/26/2019 0205   PROTEINUR NEGATIVE 02/26/2019 0205   UROBILINOGEN 0.2 02/07/2015 1230   NITRITE NEGATIVE 02/26/2019 0205   LEUKOCYTESUR NEGATIVE 02/26/2019 0205   Sepsis Labs: '@LABRCNTIP'$ (procalcitonin:4,lacticidven:4) ) Recent Results (from the past 240 hour(s))  SARS Coronavirus 2 Ag (30 min TAT) - Nasal Swab (BD Veritor Kit)     Status: None   Collection Time: 04/23/19  9:53 PM   Specimen: Nasal Swab (BD Veritor Kit)  Result Value Ref Range Status   SARS Coronavirus 2 Ag NEGATIVE NEGATIVE Final    Comment: (NOTE) SARS-CoV-2 antigen NOT DETECTED.  Negative results are presumptive.  Negative results do not preclude SARS-CoV-2 infection and should not be used as the sole basis for treatment or other patient management decisions, including infection  control decisions, particularly in the presence of clinical signs and  symptoms consistent with COVID-19, or  in those who have been in contact with the virus.  Negative results must be combined with clinical observations, patient history, and epidemiological information. The expected result is Negative. Fact Sheet for Patients: PodPark.tn Fact Sheet for Healthcare Providers: GiftContent.is This test is not yet approved or cleared by the [place] FDA and  has been authorized for detection and/or diagnosis of SARS-CoV-2 by FDA under an Emergency Use Authorization (EUA).  This EUA will remain in effect (meaning this test can be used) for the duration of  the COVID-19 de claration under Section 564(b)(1) of the Act, 21 U.S.C. section 360bbb-3(b)(1), unless the authorization is terminated or revoked sooner. Performed at [facility], [address] [place], [place] [address]      Radiological Exams on Admission: DG Chest 2 View  Result Date: 04/23/2019 CLINICAL DATA:  Chest pain EXAM: CHEST - 2 VIEW COMPARISON:  08/26/2018 FINDINGS: The heart size and mediastinal contours are within normal limits. Both lungs are clear. Diffuse osteophytosis of the thoracic spine. IMPRESSION: No active cardiopulmonary disease. Electronically Signed   By: Donavan Foil M.D.   On: 04/23/2019 17:16   CT Head Wo Contrast  Result Date: 04/24/2019 CLINICAL DATA:  Unwitnessed fall in emergency department tonight. Left frontal laceration and headache. EXAM: CT HEAD WITHOUT CONTRAST TECHNIQUE: Contiguous axial images were obtained from the base of the skull through the vertex without intravenous contrast. COMPARISON:  Head CT 03/14/2017 FINDINGS: Brain: No intracranial hemorrhage, mass effect, or midline shift. No hydrocephalus. Partially empty sella, unchanged from prior. The basilar cisterns are patent. Mild chronic small vessel ischemia, similar to prior. No evidence of territorial infarct or acute ischemia. No extra-axial or intracranial fluid  collection. Vascular: No hyperdense vessel or unexpected calcification. Skull: No fracture or focal lesion. Sinuses/Orbits: Atherosclerosis of skullbase vasculature without hyperdense vessel or abnormal calcification. Other: None. IMPRESSION: No acute intracranial abnormality. Electronically Signed   By: Keith Rake M.D.   On: 04/24/2019 00:48   CT Cervical Spine Wo Contrast  Result Date: 04/24/2019 CLINICAL DATA:  Unwitnessed fall in the emergency department tonight. Left frontal laceration and headaches. EXAM: CT CERVICAL SPINE WITHOUT CONTRAST TECHNIQUE: Multidetector CT imaging of the  cervical spine was performed without intravenous contrast. Multiplanar CT image reconstructions were also generated. COMPARISON:  None. FINDINGS: Alignment: Straightening of normal lordosis. No traumatic subluxation. Skull base and vertebrae: No acute fracture. Vertebral body heights are maintained. The dens and skull base are intact. Soft tissues and spinal canal: No prevertebral fluid or swelling. No visible canal hematoma. Disc levels: Mild diffuse disc space narrowing and endplate spurring. Scattered facet hypertrophy. Upper chest: No acute findings. Slight heterogeneous thyroid gland without dominant nodule. Other: None. IMPRESSION: Mild degenerative change in the cervical spine without acute fracture or traumatic subluxation. Electronically Signed   By: Keith Rake M.D.   On: 04/24/2019 00:56   CT ABDOMEN PELVIS W CONTRAST  Result Date: 04/23/2019 CLINICAL DATA:  Rectal bleeding EXAM: CT ABDOMEN AND PELVIS WITH CONTRAST TECHNIQUE: Multidetector CT imaging of the abdomen and pelvis was performed using the standard protocol following bolus administration of intravenous contrast. CONTRAST:  159m OMNIPAQUE IOHEXOL 300 MG/ML  SOLN COMPARISON:  02/26/2019 FINDINGS: Lower chest: Lung bases are clear. No effusions. Heart is normal size. Hepatobiliary: Diffuse low-density throughout the liver compatible with fatty  infiltration. No focal abnormality. Gallbladder unremarkable. Pancreas: No focal abnormality or ductal dilatation. Spleen: No focal abnormality.  Normal size. Adrenals/Urinary Tract: No adrenal abnormality. No focal renal abnormality. No stones or hydronephrosis. Urinary bladder is unremarkable. Stomach/Bowel: Diffuse colonic diverticulosis. This is most pronounced in the descending colon and sigmoid colon. No active diverticulitis. Normal appendix. Stomach and small bowel decompressed, unremarkable. Vascular/Lymphatic: No evidence of aneurysm or adenopathy. Reproductive: No visible focal abnormality. Other: No free fluid or free air. Musculoskeletal: No acute bony abnormality. Degenerative disc and facet disease in the lumbar spine. IMPRESSION: Diffuse colonic diverticulosis, most pronounced in the left colon. No active diverticulitis. Fatty infiltration of the liver. Electronically Signed   By: KRolm BaptiseM.D.   On: 04/23/2019 19:47    EKG: Independently reviewed. Sinus rhythm, LAFB.   Assessment/Plan   1. Acute GI bleeding; microcytic anemia  - Presents with painless hematochezia, had several more episodes in ED and developed some lower abd discomfort and syncope  - Hgb 12.4 -> 10.2 over 5 hours in ED, BP and HR have been normal  - Patient okay with blood transfusion if needed  - Hold Xarelto, type and screen, follow serial H&H, transfuse as needed    2. Syncope   - Patient developed acute nausea, sweats, and lightheadedness while having hematochezia in ED and suffered a syncopal episode, hitting his head and lacerating frontal scalp  - Likely vasovagal or orthostatic given the clinical scenario - Head CT was negative, Tdap updated, and laceration repaired in ED  - Continue cardiac monitoring for now, continue IVF hydration, up with assistance only   3. Paroxysmal atrial fibrillation - In sinus rhythm on admission  - Hold Xarelto as above    4. CKD II-IIIa  - SCr is 1.29 in ED, lower  than priors  - Renally-dose medications, monitor    5. Hypertension  - BP at goal  - Hold antihypertensives initially in setting of acute GIB with syncope    DVT prophylaxis: SCD's, Xarelto pta  Code Status: Full  Family Communication: Discussed with patient  Consults called: GI consulted by ED physician  Admission status: Observation     TVianne Bulls MD [facility] Pager [phone] If 7PM-7AM, please contact night-coverage [email or web address] Password [email or web address]  04/24/2019, 1:30 AM

## 2019-04-24 NOTE — ED Notes (Signed)
Pt returned from CT  Pt is alert and oriented x 3  PA at bedside getting ready to suture laceration on head  Pt able to move all extremities

## 2019-04-24 NOTE — ED Provider Notes (Signed)
Medical screening examination/treatment/procedure(s) were conducted as a shared visit with non-physician practitioner(s) and myself.  I personally evaluated the patient during the encounter.    Patient has history of atrial fibrillation and is anticoagulated on Xarelto.  He had several episodes of large, bright red bloody stool.  No associated abdominal pain.  No vomiting.  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  Last colonoscopy approximately 1 year ago.  Patient alert with no respiratory distress clear mental status and nontoxic.  Movements coordinated and symmetric without difficulty.  Patient had incident of syncope in the bathroom with collapse and forehead laceration.  At that time, patient diaphoretic and pale.  Forehead laceration over the left brow approximately 4 cm.  Patient slowly became more alert.  CT head obtained with no acute bleed identified.  PA-C [place] repaired laceration.  Patient had already been admitted prior to this event.  He was reassessed and imaged for stability for transport.  Vital signs normalized and no evidence of intracranial bleed on CT head.  Patient cleared for transport as planned for admission.   Charlesetta Shanks, MD 04/28/19 [phone]

## 2019-04-24 NOTE — ED Notes (Signed)
Pt transported to [facility] via [employer] 

## 2019-04-24 NOTE — ED Notes (Signed)
ED Provider at bedside suturing.  

## 2019-04-24 NOTE — Progress Notes (Signed)
Pt sat up on side of bed to wash up, did not stand up. HR jumped to 170s ST sustaining while sitting on edge of bed. Pt was symptomatic- c/o feeling his heart racing and dizziness. HR quickly lowered to 80-90s once he lied down. When attempting to sit up again to finish washing, HR again jumped as high as 178 sustaining. Pt now lying in bed, HR 80s. VSS. MD [place] Crane made aware.

## 2019-04-24 NOTE — Progress Notes (Signed)
[name]:[id number] DOB: 27-Aug-1960 DOA: 04/23/2019 PCP: [name], MD  HPI/Recap of past 24 hours: [name] is a 59 y.o. male with medical history significant for PAF on Xarelto, hypertension, COPD, now presenting to the emergency department for evaluation of acute onset of rectal bleeding.  In his usual state of health until approximately 2:30 PM on 04/23/2019 when he had a sudden urge to move his bowels, but saw mainly just red blood in the toilet bowl.  + FOBT with sharp drop in Hg.  Syncope in the ED. CT head and neck non acute.   Admitted for evaluation of acute lower GI bleed.  Seen and examined:  Denies any bowel movement at the time of this exam. Reports diffused lower abd discomfort.  [place] consulted.  Assessment/Plan: Principal Problem:   Acute lower GI bleeding Active Problems:   HTN (hypertension)   Hypokalemia   CKD (chronic kidney disease) stage 3, GFR 30-59 ml/min   PAF (paroxysmal atrial fibrillation) (HCC)   COPD (chronic obstructive pulmonary disease) (HCC)   Syncope  1. Acute lower GI bleeding; microcytic anemia  - Presents with painless hematochezia, had several more episodes in ED and developed some lower abd discomfort and syncope  + FOBT Hg continues to drop down to 9.6 this AM from 12.4 on presentation C/w IV fluid to maintain MAP>65 GI [place] consulted Continue to hold xarelto Transfuse with Hg less than 8 in the setting of active lower GI bleed. Rest of management per GI  2. Syncope   - Patient developed acute nausea, sweats, and lightheadedness while having hematochezia in ED and suffered a syncopal episode, hitting his head and lacerating frontal scalp  - Likely vasovagal or orthostatic given the clinical scenario - Head CT was negative, Tdap updated, and laceration repaired in ED  - Continue cardiac monitoring for now, continue IVF hydration, up with assistance only  Fall precautions Orthosthatic vital  signs likely tomorrow when more stable  3. Paroxysmal atrial fibrillation - In SR this am - Hold Xarelto as above    4. CKD II-IIIa  - SCr is 1.29 in ED, lower than priors  - Renally-dose medications, monitor   Avoid nephrotoxins Continue IV fluid hydration  5. Hypertension  BP soft in the setting of GI bleed Maintain MAP>65 - continue to Hold antihypertensives initially in setting of acute GIB with syncope    DVT prophylaxis: SCD's, Xarelto on hold Code Status: Full  Family Communication: None at bedside Consults called: GI [place] consulted on 04/24/19  Disposition:  Patient is currently not appropriate for dc at this time due to ongoing acute blood loss in the setting of acute lower gi bleed.  Requiring IV fluid to maintain normotensive BP.  Patient requires at least 2 midnights for further evaluation and treatment of present condition.     Objective: Vitals:   04/23/19 2148 04/24/19 0026 04/24/19 0108 04/24/19 0529  BP: 98/78 (!) 125/99 (!) 118/96 103/64  Pulse: 76 68 75 79  Resp: '16 18 18 18  '$ Temp: 98.1 F (36.7 C)  98.1 F (36.7 C) 98.2 F (36.8 C)  TempSrc: Oral  Oral Oral  SpO2: 98% 100% 100% 96%  Weight:      Height:        Intake/Output Summary (Last 24 hours) at 04/24/2019 0931 Last data filed at 04/24/2019 0600 Gross per 24 hour  Intake 1355.76 ml  Output 200 ml  Net 1155.76 ml   [employer]  04/23/19 1614  Weight: 98 kg    Exam:  . General: 59 y.o. year-old male well developed well nourished in no acute distress.  Alert and oriented x3. . Cardiovascular: Regular rate and rhythm with no rubs or gallops.  No thyromegaly or JVD noted.   Marland Kitchen Respiratory: Clear to auscultation with no wheezes or rales. Good inspiratory effort. . Abdomen: Soft Lower abd tenderness with palpation diffusely. Bowel sounds present. . Musculoskeletal: No lower extremity edema. 2/4 pulses in all 4 extremities. Marland Kitchen Psychiatry: Mood is appropriate for condition and  setting   Data Reviewed: CBC: Recent Labs  Lab 04/23/19 1643 04/24/19 0013 04/24/19 0555  WBC 8.5 7.8  --   HGB 12.4* 10.2* 9.6*  HCT 40.1 33.5* 31.4*  MCV 71.4* 72.2*  --   PLT 277 246  --    Basic Metabolic Panel: Recent Labs  Lab 04/23/19 1643 04/24/19 0555  NA 140 141  K 3.4* 3.7  CL 108 110  CO2 23 23  GLUCOSE 116* 119*  BUN 14 15  CREATININE 1.29* 1.23  CALCIUM 9.4 8.5*  MG  --  2.0   GFR: Estimated Creatinine Clearance: 76.2 mL/min (by C-G formula based on SCr of 1.23 mg/dL). Liver Function Tests: Recent Labs  Lab 04/24/19 0555  AST 22  ALT 19  ALKPHOS 62  BILITOT 0.8  PROT 6.7  ALBUMIN 3.5   No results for input(s): LIPASE, AMYLASE in the last 168 hours. No results for input(s): AMMONIA in the last 168 hours. Coagulation Profile: No results for input(s): INR, PROTIME in the last 168 hours. Cardiac Enzymes: No results for input(s): CKTOTAL, CKMB, CKMBINDEX, TROPONINI in the last 168 hours. BNP (last 3 results) No results for input(s): PROBNP in the last 8760 hours. HbA1C: No results for input(s): HGBA1C in the last 72 hours. CBG: No results for input(s): GLUCAP in the last 168 hours. Lipid Profile: No results for input(s): CHOL, HDL, LDLCALC, TRIG, CHOLHDL, LDLDIRECT in the last 72 hours. Thyroid Function Tests: No results for input(s): TSH, T4TOTAL, FREET4, T3FREE, THYROIDAB in the last 72 hours. Anemia Panel: No results for input(s): VITAMINB12, FOLATE, FERRITIN, TIBC, IRON, RETICCTPCT in the last 72 hours. Urine analysis:    Component Value Date/Time   COLORURINE YELLOW 02/26/2019 0205   APPEARANCEUR CLEAR 02/26/2019 0205   LABSPEC 1.025 02/26/2019 0205   PHURINE 6.0 02/26/2019 0205   GLUCOSEU NEGATIVE 02/26/2019 0205   HGBUR SMALL (A) 02/26/2019 0205   BILIRUBINUR NEGATIVE 02/26/2019 0205   KETONESUR NEGATIVE 02/26/2019 0205   PROTEINUR NEGATIVE 02/26/2019 0205   UROBILINOGEN 0.2 02/07/2015 1230   NITRITE NEGATIVE 02/26/2019 0205     LEUKOCYTESUR NEGATIVE 02/26/2019 0205   Sepsis Labs: '@LABRCNTIP'$ (procalcitonin:4,lacticidven:4)  ) Recent Results (from the past 240 hour(s))  SARS Coronavirus 2 Ag (30 min TAT) - Nasal Swab (BD Veritor Kit)     Status: None   Collection Time: 04/23/19  9:53 PM   Specimen: Nasal Swab (BD Veritor Kit)  Result Value Ref Range Status   SARS Coronavirus 2 Ag NEGATIVE NEGATIVE Final    Comment: (NOTE) SARS-CoV-2 antigen NOT DETECTED.  Negative results are presumptive.  Negative results do not preclude SARS-CoV-2 infection and should not be used as the sole basis for treatment or other patient management decisions, including infection  control decisions, particularly in the presence of clinical signs and  symptoms consistent with COVID-19, or in those who have been in contact with the virus.  Negative results must be combined with clinical observations, patient  history, and epidemiological information. The expected result is Negative. Fact Sheet for Patients: PodPark.tn Fact Sheet for Healthcare Providers: GiftContent.is This test is not yet approved or cleared by the [place] FDA and  has been authorized for detection and/or diagnosis of SARS-CoV-2 by FDA under an Emergency Use Authorization (EUA).  This EUA will remain in effect (meaning this test can be used) for the duration of  the COVID-19 de claration under Section 564(b)(1) of the Act, 21 U.S.C. section 360bbb-3(b)(1), unless the authorization is terminated or revoked sooner. Performed at [facility], [address] [place], [place] [address]   SARS CORONAVIRUS 2 (TAT 6-24 HRS) Nasopharyngeal Nasopharyngeal Swab     Status: None   Collection Time: 04/24/19  2:15 AM   Specimen: Nasopharyngeal Swab  Result Value Ref Range Status   SARS Coronavirus 2 NEGATIVE NEGATIVE Final    Comment: (NOTE) SARS-CoV-2 target nucleic acids are NOT DETECTED. The  SARS-CoV-2 RNA is generally detectable in upper and lower respiratory specimens during the acute phase of infection. Negative results do not preclude SARS-CoV-2 infection, do not rule out co-infections with other pathogens, and should not be used as the sole basis for treatment or other patient management decisions. Negative results must be combined with clinical observations, patient history, and epidemiological information. The expected result is Negative. Fact Sheet for Patients: SugarRoll.be Fact Sheet for Healthcare Providers: https://www.woods-mathews.com/ This test is not yet approved or cleared by the [place] FDA and  has been authorized for detection and/or diagnosis of SARS-CoV-2 by FDA under an Emergency Use Authorization (EUA). This EUA will remain  in effect (meaning this test can be used) for the duration of the COVID-19 declaration under Section 56 4(b)(1) of the Act, 21 U.S.C. section 360bbb-3(b)(1), unless the authorization is terminated or revoked sooner. Performed at [facility], [place] [address] [place], [place] [address]       Studies: DG Chest 2 View  Result Date: 04/23/2019 CLINICAL DATA:  Chest pain EXAM: CHEST - 2 VIEW COMPARISON:  08/26/2018 FINDINGS: The heart size and mediastinal contours are within normal limits. Both lungs are clear. Diffuse osteophytosis of the thoracic spine. IMPRESSION: No active cardiopulmonary disease. Electronically Signed   By: Donavan Foil M.D.   On: 04/23/2019 17:16   CT Head Wo Contrast  Result Date: 04/24/2019 CLINICAL DATA:  Unwitnessed fall in emergency department tonight. Left frontal laceration and headache. EXAM: CT HEAD WITHOUT CONTRAST TECHNIQUE: Contiguous axial images were obtained from the base of the skull through the vertex without intravenous contrast. COMPARISON:  Head CT 03/14/2017 FINDINGS: Brain: No intracranial hemorrhage, mass effect, or midline shift. No  hydrocephalus. Partially empty sella, unchanged from prior. The basilar cisterns are patent. Mild chronic small vessel ischemia, similar to prior. No evidence of territorial infarct or acute ischemia. No extra-axial or intracranial fluid collection. Vascular: No hyperdense vessel or unexpected calcification. Skull: No fracture or focal lesion. Sinuses/Orbits: Atherosclerosis of skullbase vasculature without hyperdense vessel or abnormal calcification. Other: None. IMPRESSION: No acute intracranial abnormality. Electronically Signed   By: Keith Rake M.D.   On: 04/24/2019 00:48   CT Cervical Spine Wo Contrast  Result Date: 04/24/2019 CLINICAL DATA:  Unwitnessed fall in the emergency department tonight. Left frontal laceration and headaches. EXAM: CT CERVICAL SPINE WITHOUT CONTRAST TECHNIQUE: Multidetector CT imaging of the cervical spine was performed without intravenous contrast. Multiplanar CT image reconstructions were also generated. COMPARISON:  None. FINDINGS: Alignment: Straightening of normal lordosis. No traumatic subluxation.  Skull base and vertebrae: No acute fracture. Vertebral body heights are maintained. The dens and skull base are intact. Soft tissues and spinal canal: No prevertebral fluid or swelling. No visible canal hematoma. Disc levels: Mild diffuse disc space narrowing and endplate spurring. Scattered facet hypertrophy. Upper chest: No acute findings. Slight heterogeneous thyroid gland without dominant nodule. Other: None. IMPRESSION: Mild degenerative change in the cervical spine without acute fracture or traumatic subluxation. Electronically Signed   By: Keith Rake M.D.   On: 04/24/2019 00:56   CT ABDOMEN PELVIS W CONTRAST  Result Date: 04/23/2019 CLINICAL DATA:  Rectal bleeding EXAM: CT ABDOMEN AND PELVIS WITH CONTRAST TECHNIQUE: Multidetector CT imaging of the abdomen and pelvis was performed using the standard protocol following bolus administration of intravenous  contrast. CONTRAST:  148m OMNIPAQUE IOHEXOL 300 MG/ML  SOLN COMPARISON:  02/26/2019 FINDINGS: Lower chest: Lung bases are clear. No effusions. Heart is normal size. Hepatobiliary: Diffuse low-density throughout the liver compatible with fatty infiltration. No focal abnormality. Gallbladder unremarkable. Pancreas: No focal abnormality or ductal dilatation. Spleen: No focal abnormality.  Normal size. Adrenals/Urinary Tract: No adrenal abnormality. No focal renal abnormality. No stones or hydronephrosis. Urinary bladder is unremarkable. Stomach/Bowel: Diffuse colonic diverticulosis. This is most pronounced in the descending colon and sigmoid colon. No active diverticulitis. Normal appendix. Stomach and small bowel decompressed, unremarkable. Vascular/Lymphatic: No evidence of aneurysm or adenopathy. Reproductive: No visible focal abnormality. Other: No free fluid or free air. Musculoskeletal: No acute bony abnormality. Degenerative disc and facet disease in the lumbar spine. IMPRESSION: Diffuse colonic diverticulosis, most pronounced in the left colon. No active diverticulitis. Fatty infiltration of the liver. Electronically Signed   By: KRolm BaptiseM.D.   On: 04/23/2019 19:47    Scheduled Meds: . lidocaine      . sodium chloride flush  3 mL Intravenous Once  . sodium chloride flush  3 mL Intravenous Q12H  . umeclidinium bromide  1 puff Inhalation Daily    Continuous Infusions: . 0.9 % NaCl with KCl 20 mEq / L 100 mL/hr at 04/24/19 0241     LOS: 0 days     CKayleen Memos MD [facility] Pager [phone] If 7PM-7AM, please contact night-coverage [email or web address] Password [email or web address]1/16/2021, 9:31 AM

## 2019-04-24 NOTE — ED Notes (Signed)
Pt called to go to the bathroom. This EMT went to help Pt out of bed. This EMT asked Pt he is ok to walk to the bathroom next door. Pt was in the bathroom having a BM while I waited outside the door. I heard a noise in bathroom so I opened the door to find Pt on the floor and called for assistance. Pt initially was not responsive noted to have a laceration on forehead. RN, MD, RT, and EMT arrived to assist Pt to stretcher.

## 2019-04-24 NOTE — Consult Note (Signed)
Consultation   Referring Provider: [facility]/ Dr Nevada Crane Primary Care Physician:  [name], MD Primary Gastroenterologist:  Dr.Le/ [facility] / Tarri Glenn last admit  Reason for Consultation: Acute GI bleed  HPI: [name] is a 59 y.o. male, who we are asked to see regarding acute GI hemorrhage with syncope. Patient was evaluated by Dr. Tarri Glenn during last admission in May 2020.  He tells me he has followed up with Dr. Marin Comment with [facility] since that time. 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  Patient had been feeling well until about 230 yesterday afternoon when he had abrupt onset of grossly bloody bowel movement.  He had several episodes at home and then proceeded to the emergency room at [facility].  He continued to have bloody bowel movements and had a syncopal episode while in the emergency room.  He says his last bowel movement was at about 1 AM.  He has not had any associated abdominal pain or cramping, no nausea or vomiting. Hemoglobin was 12.1 on arrival to the emergency room, down to 9.6 early this a.m.  Patient is on chronic Eliquis for atrial fibrillation, also has history of hypertension and COPD.  He did take the Eliquis yesterday morning.  Work-up was done in May 2020 after he had presented with melena and heme positive stool with anemia.  He had enteroscopy that showed a small hiatal hernia, no Cameron erosions, no esophagitis and was otherwise negative exam.  Colonoscopy revealed a 2 mm polyp in the transverse colon and multiple large and small mouth diverticuli. Capsule endoscopy at that time showed rapid transit through the small bowel mild duodenitis and otherwise was negative.   Patient is feeling very weak and washed out this morning.  He says he feels lightheaded even getting up to the  bedside commode.  Past Medical History:  Diagnosis Date   A-fib [facility])    Chest pain    a. 11/2002 Cath: EF 68%, LM nl, LAD small, nl, LCX nl, RCA tortuous/nl;  b. 08/2011 St Echo: EF 60%, normal contractility, no scar/ischemia.   COPD (chronic obstructive pulmonary disease) (HCC)    Enlarged prostate    GERD (gastroesophageal reflux disease)    GI bleeding 08/2018   Hypertension    Sickle cell trait (HCC)    Sleep apnea    SVT (supraventricular tachycardia) ([place])     Past Surgical History:  Procedure Laterality Date   ATRIAL FIBRILLATION ABLATION     BIOPSY  08/27/2018   Procedure: BIOPSY;  Surgeon: Thornton Park, MD;  Location: [place];  Service: Gastroenterology;;   BIOPSY  08/28/2018   Procedure: BIOPSY;  Surgeon: Thornton Park, MD;  Location: [place];  Service: Gastroenterology;;   COLONOSCOPY WITH PROPOFOL N/A 08/28/2018   Procedure: COLONOSCOPY WITH PROPOFOL;  Surgeon: Thornton Park, MD;  Location: [place];  Service: Gastroenterology;  Laterality: N/A;   ENTEROSCOPY N/A 08/27/2018   Procedure: ENTEROSCOPY;  Surgeon: Thornton Park, MD;  Location: [place];  Service: Gastroenterology;  Laterality: N/A;   GIVENS CAPSULE STUDY N/A 08/28/2018   Procedure: GIVENS CAPSULE STUDY;  Surgeon: Thornton Park, MD;  Location: [place];  Service: Gastroenterology;  Laterality: N/A;   LEFT HEART CATHETERIZATION WITH CORONARY ANGIOGRAM N/A 03/11/2013   Procedure: LEFT HEART CATHETERIZATION WITH CORONARY ANGIOGRAM;  Surgeon: Jolaine Artist, MD;  Location: [facility] CATH LAB;  Service: Cardiovascular;  Laterality: N/A;   POLYPECTOMY  08/28/2018   Procedure: POLYPECTOMY;  Surgeon: Thornton Park, MD;  Location: [place];  Service: Gastroenterology;;   TONSILLECTOMY     TRANSURETHRAL RESECTION OF PROSTATE      Prior to Admission medications   Medication Sig Start Date End Date Taking? Authorizing Provider  acetaminophen (TYLENOL) 325  MG tablet Take 650 mg by mouth every 6 (six) hours as needed for moderate pain.   Yes [provider]  amLODipine (NORVASC) 10 MG tablet Take 1 tablet (10 mg total) by mouth daily. 02/07/15  Yes Charlesetta Shanks, MD  carvedilol (COREG) 3.125 MG tablet Take 3.125 mg by mouth daily.  09/12/18  Yes [provider]  cetirizine (ZYRTEC) 10 MG tablet Take 10 mg by mouth daily as needed for allergies.    Yes [provider]  fluticasone (FLONASE) 50 MCG/ACT nasal spray Place 1 spray into both nostrils daily as needed for allergies or rhinitis.   Yes [provider]  Fluticasone-Salmeterol (ADVAIR) 250-50 MCG/DOSE AEPB Inhale 1 puff into the lungs 2 (two) times daily as needed (wheezing).   Yes [provider]  furosemide (LASIX) 20 MG tablet Take 20 mg by mouth daily.   Yes [provider]  linaclotide (LINZESS) 290 MCG CAPS capsule Take 1 capsule (290 mcg total) by mouth daily before breakfast. 08/30/18  Yes Hongalgi, Lenis Dickinson, MD  losartan (COZAAR) 25 MG tablet Take 25 mg by mouth daily.  09/12/18  Yes [provider]  ondansetron (ZOFRAN) 8 MG tablet Take 8 mg by mouth every 8 (eight) hours as needed for nausea or vomiting.  10/20/18  Yes [provider]  pantoprazole (PROTONIX) 40 MG tablet Take 40 mg by mouth daily. 07/28/14  Yes [provider]  polyethylene glycol (MIRALAX / GLYCOLAX) 17 g packet Take 17 g by mouth 2 (two) times daily. 08/29/18  Yes Hongalgi, Lenis Dickinson, MD  Prenatal Vit-Fe Fumarate-FA (MULTIVITAMIN-PRENATAL) 27-0.8 MG TABS tablet Take 1 tablet by mouth daily at 12 noon.   Yes [provider]  sildenafil (REVATIO) 20 MG tablet TAKE 3-5 TABLETS BY MOUTH AS DIRECTED 11/25/18  Yes [provider]  sotalol (BETAPACE) 80 MG tablet Take 40 mg by mouth 2 (two) times daily.   Yes [provider]  traMADol (ULTRAM) 50 MG tablet Take 50 mg by mouth every 6 (six) hours as needed for moderate pain.   Yes  [provider]  triamterene-hydrochlorothiazide (DYAZIDE) 37.5-25 MG capsule Take 1 capsule by mouth every morning. 01/27/19  Yes [provider]  XARELTO 20 MG TABS tablet Take 20 mg by mouth daily. 05/03/18  Yes [provider]  methocarbamol (ROBAXIN) 500 MG tablet Take 1 tablet (500 mg total) by mouth every 8 (eight) hours as needed for muscle spasms. Patient not taking: Reported on 04/24/2019 03/04/19   Lucrezia Starch, MD  nitroGLYCERIN (NITROSTAT) 0.4 MG SL tablet Place 1 tablet (0.4 mg total) under the tongue every 5 (five) minutes as needed for chest pain. 03/12/13   Thurnell Lose, MD  oxyCODONE-acetaminophen (PERCOCET) 5-325 MG tablet Take 1 tablet by mouth every 4 (four) hours as needed for severe pain. Patient not taking: Reported on 04/24/2019 02/26/19   Molpus, John, MD  gabapentin (NEURONTIN) 300 MG capsule Take 300 mg by mouth at bedtime.  02/26/19  [provider]    Current Facility-Administered Medications  Medication Dose Route Frequency  Provider Last Rate Last Admin   0.9 % NaCl with KCl 20 mEq/ L  infusion   Intravenous Continuous Opyd, Ilene Qua, MD 100 mL/hr at 04/24/19 0241 New Bag at 04/24/19 0241   acetaminophen (TYLENOL) tablet 650 mg  650 mg Oral Q6H PRN Opyd, Ilene Qua, MD       Or   acetaminophen (TYLENOL) suppository 650 mg  650 mg Rectal Q6H PRN Opyd, Ilene Qua, MD       albuterol (PROVENTIL) (2.5 MG/3ML) 0.083% nebulizer solution 2.5 mg  2.5 mg Nebulization Q6H PRN Opyd, Ilene Qua, MD       lidocaine (XYLOCAINE) 1 % (with pres) injection            morphine 2 MG/ML injection 1-3 mg  1-3 mg Intravenous Q4H PRN Opyd, Ilene Qua, MD   2 mg at 04/24/19 0244   ondansetron (ZOFRAN) tablet 4 mg  4 mg Oral Q6H PRN Opyd, Ilene Qua, MD       Or   ondansetron (ZOFRAN) injection 4 mg  4 mg Intravenous Q6H PRN Opyd, Ilene Qua, MD       sodium chloride flush (NS) 0.9 % injection 3 mL  3 mL Intravenous Once Opyd, Ilene Qua, MD        sodium chloride flush (NS) 0.9 % injection 3 mL  3 mL Intravenous Q12H Opyd, Ilene Qua, MD       umeclidinium bromide (INCRUSE ELLIPTA) 62.5 MCG/INH 1 puff  1 puff Inhalation Daily Opyd, Ilene Qua, MD        Allergies as of 04/23/2019   (No Known Allergies)    Family History  Problem Relation Age of Onset   CAD Mother        died in her 88's - MI   Multiple myeloma Brother    Sickle cell anemia Father        died @ 9   Gastric cancer Neg Hx     Social History   Socioeconomic History   Marital status: Divorced    Spouse name: Not on file   Number of children: Not on file   Years of education: Not on file   Highest education level: Not on file  Occupational History   Not on file  Tobacco Use   Smoking status: Never Smoker   Smokeless tobacco: Never Used  Substance and Sexual Activity   Alcohol use: No   Drug use: Never   Sexual activity: Not on file  Other Topics Concern   Not on file  Social History Narrative   Lives in [place] with dtr.   Social Determinants of Health   Financial Resource Strain:    Difficulty of Paying Living Expenses: Not on file  Food Insecurity:    Worried About Charity fundraiser in the Last Year: Not on file   [employer] of Food in the Last Year: Not on file  Transportation Needs:    Lack of Transportation (Medical): Not on file   Lack of Transportation (Non-Medical): Not on file  Physical Activity:    Days of Exercise per Week: Not on file   Minutes of Exercise per Session: Not on file  Stress:    Feeling of Stress : Not on file  Social Connections:    Frequency of Communication with Friends and Family: Not on file   Frequency of Social Gatherings with Friends and Family: Not on file   Attends Religious Services: Not on file   Active Member of Clubs  or Organizations: Not on file   Attends Archivist Meetings: Not on file   Marital Status: Not on file  Intimate Partner Violence:    Fear  of Current or Ex-Partner: Not on file   Emotionally Abused: Not on file   Physically Abused: Not on file   Sexually Abused: Not on file    Review of Systems: Pertinent positive and negative review of systems were noted in the above HPI section.  All other review of systems was otherwise negative. Physical Exam: Vital signs in last 24 hours: Temp:  [98.1 F (36.7 C)-98.8 F (37.1 C)] 98.2 F (36.8 C) (01/16 0529) Pulse Rate:  [68-79] 79 (01/16 0529) Resp:  [16-18] 18 (01/16 0529) BP: (98-158)/(64-112) 103/64 (01/16 0529) SpO2:  [96 %-100 %] 96 % (01/16 0529) Weight:  [98 kg] 98 kg (01/15 1614) Last BM Date: 04/24/19 General:   Alert,  Well-developed, well-nourished, older African-American male pleasant and cooperative in NAD.  Daughter at bedside Head:  Normocephalic and atraumatic. Eyes:  Sclera clear, no icterus.   Conjunctiva pink. Ears:  Normal auditory acuity. Nose:  No deformity, discharge,  or lesions. Mouth:  No deformity or lesions.   Neck:  Supple; no masses or thyromegaly. Lungs:  Clear throughout to auscultation.   No wheezes, crackles, or rhonchi. Heart:  Regular rate and rhythm; no murmurs, clicks, rubs,  or gallops. Abdomen:  Soft,nontender, BS active,nonpalp mass or hsm.   Rectal:  Deferred  Msk:  Symmetrical without gross deformities. . Pulses:  Normal pulses noted. Extremities:  Without clubbing or edema. Neurologic:  Alert and  oriented x4;  grossly normal neurologically. Skin:  Intact without significant lesions or rashes.. Psych:  Alert and cooperative. Normal mood and affect.  Intake/Output from previous day: 01/15 0701 - 01/16 0700 In: 1355.8 [P.O.:30; I.V.:325.8; IV Piggyback:1000] Out: 200 [Urine:200] Intake/Output this shift: No intake/output data recorded.  Lab Results: Recent Labs    04/23/19 1643 04/24/19 0013 04/24/19 0555  WBC 8.5 7.8  --   HGB 12.4* 10.2* 9.6*  HCT 40.1 33.5* 31.4*  PLT 277 246  --    BMET Recent Labs     04/23/19 1643 04/24/19 0555  NA 140 141  K 3.4* 3.7  CL 108 110  CO2 23 23  GLUCOSE 116* 119*  BUN 14 15  CREATININE 1.29* 1.23  CALCIUM 9.4 8.5*   LFT Recent Labs    04/24/19 0555  PROT 6.7  ALBUMIN 3.5  AST 22  ALT 19  ALKPHOS 62  BILITOT 0.8   PT/INR No results for input(s): LABPROT, INR in the last 72 hours. Hepatitis Panel No results for input(s): HEPBSAG, HCVAB, HEPAIGM, HEPBIGM in the last 72 hours.    IMPRESSION:  #91 59 year old African-American male with acute lower GI hemorrhage in setting of chronic Eliquis. This is very likely a diverticular bleed. CT scan done last evening showed diffuse diverticulosis.  Hopefully he has stopped, last bowel movement was about 10 hours ago. Patient has had very recent complete GI work-up including enteroscopy and colonoscopy in May 2020 as well as capsule endoscopy with no other lesions identified. Patient report he had a more recent capsule endoscopy done through [facility] which did identify a few proximal small bowel AVMs for which she is scheduled to undergo ablation.   #2 acute anemia secondary to GI hemorrhage #3 syncope secondary to acute GI bleed #4 history of atrial fibrillation #5.  COPD  #6.  Chronic anticoagulation-on Eliquis  Plan; clear liquids  today Bedrest with bedside commode with assistance Continue IV fluids Stat hemoglobin now then every 6 hours.  He is very symptomatic so would transfuse if hemoglobin less than 8. Continue to hold Eliquis  Will observe closely today, if he has any further evidence of active bleeding would send emergently for CT angio with embolization.  Patient is established with Dr. Huntley Dec GI and is actually scheduled Thursday of next week to undergo EGD with ablation of AVMs.   Axil Copeman EsterwoodPA-C  04/24/2019, 10:42 AM

## 2019-04-24 NOTE — ED Notes (Signed)
Dr. Johnney Killian called to room.

## 2019-04-25 ENCOUNTER — Inpatient Hospital Stay (HOSPITAL_COMMUNITY): Payer: Medicare Other

## 2019-04-25 ENCOUNTER — Encounter (HOSPITAL_COMMUNITY): Payer: Self-pay | Admitting: Internal Medicine

## 2019-04-25 LAB — CBC
HCT: 28.3 % — ABNORMAL LOW (ref 39.0–52.0)
Hemoglobin: 8.7 g/dL — ABNORMAL LOW (ref 13.0–17.0)
MCH: 22.9 pg — ABNORMAL LOW (ref 26.0–34.0)
MCHC: 30.7 g/dL (ref 30.0–36.0)
MCV: 74.5 fL — ABNORMAL LOW (ref 80.0–100.0)
Platelets: 191 10*3/uL (ref 150–400)
RBC: 3.8 MIL/uL — ABNORMAL LOW (ref 4.22–5.81)
RDW: 19.3 % — ABNORMAL HIGH (ref 11.5–15.5)
WBC: 8.1 10*3/uL (ref 4.0–10.5)
nRBC: 0 % (ref 0.0–0.2)

## 2019-04-25 LAB — COMPREHENSIVE METABOLIC PANEL
ALT: 16 U/L (ref 0–44)
AST: 20 U/L (ref 15–41)
Albumin: 3.2 g/dL — ABNORMAL LOW (ref 3.5–5.0)
Alkaline Phosphatase: 52 U/L (ref 38–126)
Anion gap: 7 (ref 5–15)
BUN: 11 mg/dL (ref 6–20)
CO2: 23 mmol/L (ref 22–32)
Calcium: 8.2 mg/dL — ABNORMAL LOW (ref 8.9–10.3)
Chloride: 110 mmol/L (ref 98–111)
Creatinine, Ser: 1.33 mg/dL — ABNORMAL HIGH (ref 0.61–1.24)
GFR calc Af Amer: >60 mL/min (ref >60)
GFR calc non Af Amer: 58 mL/min — ABNORMAL LOW (ref >60)
Glucose, Bld: 98 mg/dL (ref 70–99)
Potassium: 3.6 mmol/L (ref 3.5–5.1)
Sodium: 140 mmol/L (ref 135–145)
Total Bilirubin: 1 mg/dL (ref 0.3–1.2)
Total Protein: 6.1 g/dL — ABNORMAL LOW (ref 6.5–8.1)

## 2019-04-25 LAB — HEMOGLOBIN AND HEMATOCRIT, BLOOD
HCT: 29.8 % — ABNORMAL LOW (ref 39.0–52.0)
HCT: 29.8 % — ABNORMAL LOW (ref 39.0–52.0)
Hemoglobin: 9.2 g/dL — ABNORMAL LOW (ref 13.0–17.0)
Hemoglobin: 9.4 g/dL — ABNORMAL LOW (ref 13.0–17.0)

## 2019-04-25 LAB — PREPARE RBC (CROSSMATCH)

## 2019-04-25 MED ORDER — IOHEXOL 350 MG/ML SOLN
100.0000 mL | Freq: Once | INTRAVENOUS | Status: AC | PRN
  Administered 2019-04-25: 100 mL via INTRAVENOUS

## 2019-04-25 MED ORDER — SODIUM CHLORIDE 0.9% IV SOLUTION: Freq: Once | INTRAVENOUS | Status: AC

## 2019-04-25 MED ORDER — CARVEDILOL 3.125 MG PO TABS
3.1250 mg | ORAL_TABLET | Freq: Every day | ORAL | Status: DC
  Administered 2019-04-25 – 2019-04-28 (×4): 3.125 mg via ORAL
  Filled 2019-04-25 (×4): qty 1

## 2019-04-25 MED ORDER — SODIUM CHLORIDE 0.9 % IV SOLN: INTRAVENOUS | Status: AC

## 2019-04-25 MED ORDER — SODIUM CHLORIDE (PF) 0.9 % IJ SOLN
INTRAMUSCULAR | Status: AC
  Filled 2019-04-25: qty 50

## 2019-04-25 NOTE — Progress Notes (Signed)
Pt able to ambulate to BR and around his room with no dizziness or weakness. Pt states he feels "so much better" today. Pt did feel like he needed to have a BM, but only had a "few drops of bright red blood" in toilet. No BM substance present. Pt very concerned, states he gets constipated very easily. Ready to start Linzess medication again when able.

## 2019-04-25 NOTE — Progress Notes (Signed)
PROGRESS NOTE  [name]:[id number] DOB: 29-May-1960 DOA: 04/23/2019 PCP: Bobby Rasmussen, MD  HPI/Recap of past 24 hours: [name] is a 59 y.o. male with medical history significant for PAF on Xarelto, hypertension, COPD, now presenting to the emergency department for evaluation of acute onset of rectal bleeding.  In his usual state of health until approximately 2:30 PM on 04/23/2019 when he had a sudden urge to move his bowels, but saw mainly just red blood in the toilet bowl.  + FOBT with sharp drop in Hg.  Syncope in the ED. CT head and neck non acute.   Admitted for evaluation of acute lower GI bleed.  04/25/19:  Seen and examined.  Drop in Hg down to 7.8 1 U PRBC transfused this am. Hg improved post transfusion. No BM.  CTA unrevealing.  GI following.  Appreciate assistance.  Continue serial H&H.   Assessment/Plan: Principal Problem:   Acute lower GI bleeding Active Problems:   HTN (hypertension)   Hypokalemia   CKD (chronic kidney disease) stage 3, GFR 30-59 ml/min   PAF (paroxysmal atrial fibrillation) (HCC)   COPD (chronic obstructive pulmonary disease) (HCC)   Syncope   Chronic anticoagulation   Acute blood loss anemia  Acute lower GI bleeding; microcytic anemia  - Presents with painless hematochezia, had several more episodes in ED and developed some lower abd discomfort and syncope  + FOBT Hg continues to drop down to 9.6 this AM from 12.4 on presentation C/w IV fluid to maintain MAP>65 GI [place] consulted Continue to hold xarelto Transfuse with Hg less than 8 in the setting of active lower GI bleed. Rest of management per GI  Acute blood loss anemia in the setting of recent lower GI bleed Drop of Hg down to 7.8  Received 1 U PRBC with improvement Hg 8.7 Continue serial H&H q6H Transfuse hg < 8 CT A abd pelvis unrevealing GI following Appreciate GI assistance  Iron deficiency anemia Obtain iron studies, previous were significantly  decreased On iron supp at home in the form of prenatal vitamins.  Syncope  likely 2/2 to orthostatic hypotension - Patient developed acute nausea, sweats, and lightheadedness while having hematochezia in ED and suffered a syncopal episode, hitting his head and lacerating frontal scalp  - Likely vasovagal or orthostatic given the clinical scenario - Head CT was negative, Tdap updated, and laceration repaired in ED  - Continue cardiac monitoring for now, continue IVF hydration, up with assistance only  Fall precautions Orthosthatic vital signs when more stable  IBS with constipation with constipation Linzess held due to acute lower GI bleed No bowel movement in the last 2 days Defer to GI to restart medication  3. Paroxysmal atrial fibrillation - In SR this am - Continue to hold Xarelto as above    4. CKD II-IIIa  - SCr is 1.29 in ED, lower than priors  Slight increase in creatinine 1.3. - Renally-dose medications, monitor   Continue to avoid nephrotoxins and hypotension to avoid AKI Continue IV fluid hydration  5. Hypertension  BP soft in the setting of GI bleed Maintain MAP>65 - continue to Hold antihypertensives initially in setting of acute GIB with syncope    DVT prophylaxis: SCD's, Xarelto on hold Code Status: Full  Family Communication: None at bedside Consults called: GI [place] consulted on 04/24/19  Disposition:  Patient is currently not appropriate for dc at this time due to ongoing acute blood loss in the setting of recent acute lower  gi bleed.  Requiring IV fluid to maintain normotensive BP.      Objective: Vitals:   04/24/19 2122 04/25/19 0118 04/25/19 0150 04/25/19 0450  BP: 122/79 114/81 122/83 124/84  Pulse: 84 76 79 99  Resp: _0 Temp: 98.2 F (36.8 C) 98.5 F (36.9 C) 98.3 F (36.8 C) 98 F (36.7 C)  TempSrc: Oral Oral Oral Oral  SpO2: 100% 99% 100% 97%  Weight:      Height:        Intake/Output Summary (Last 24 hours) at  04/25/2019 1133 Last data filed at 04/25/2019 1055 Gross per 24 hour  Intake 2241.31 ml  Output 1000 ml  Net 1241.31 ml   Filed Weights   04/23/19 1614  Weight: 98 kg    Exam:  . General: 59 y.o. year-old male well-developed well-nourished in no acute distress.   . Cardiovascular: Regular rate and rhythm no rubs or gallops. Marland Kitchen Respiratory: Clear to auscultation with no wheezes or rales.  Good inspiratory effort. . Abdomen: Soft mild tenderness in lower quadrants.  Bowel sounds present. . Musculoskeletal: No lower extremity edema bilaterally.Marland Kitchen Psychiatry: Mood is appropriate for condition and setting.  Data Reviewed: CBC: Recent Labs  Lab 04/23/19 1643 04/23/19 1643 04/24/19 0013 04/24/19 0555 04/24/19 1102 04/24/19 1634 04/24/19 2302 04/25/19 0723 04/25/19 1038  WBC 8.5  --  7.8  --   --   --   --  8.1  --   HGB 12.4*   < > 10.2*   < > 9.3* 9.0* 7.8* 8.7* 9.2*  HCT 40.1   < > 33.5*   < > 29.8* 29.0* 25.5* 28.3* 29.8*  MCV 71.4*  --  72.2*  --   --   --   --  74.5*  --   PLT 277  --  246  --   --   --   --  191  --    < > = values in this interval not displayed.   Basic Metabolic Panel: Recent Labs  Lab 04/23/19 1643 04/24/19 0555 04/25/19 0723  NA 140 141 140  K 3.4* 3.7 3.6  CL 108 110 110  CO2 _1 GLUCOSE 116* 119* 98  BUN _2 CREATININE 1.29* 1.23 1.33*  CALCIUM 9.4 8.5* 8.2*  MG  --  2.0  --    GFR: Estimated Creatinine Clearance: 70.5 mL/min (A) (by C-G formula based on SCr of 1.33 mg/dL (H)). Liver Function Tests: Recent Labs  Lab 04/24/19 0555 04/25/19 0723  AST 22 20  ALT 19 16  ALKPHOS 62 52  BILITOT 0.8 1.0  PROT 6.7 6.1*  ALBUMIN 3.5 3.2*   No results for input(s): LIPASE, AMYLASE in the last 168 hours. No results for input(s): AMMONIA in the last 168 hours. Coagulation Profile: No results for input(s): INR, PROTIME in the last 168 hours. Cardiac Enzymes: No results for input(s): CKTOTAL, CKMB, CKMBINDEX, TROPONINI in the  last 168 hours. BNP (last 3 results) No results for input(s): PROBNP in the last 8760 hours. HbA1C: No results for input(s): HGBA1C in the last 72 hours. CBG: No results for input(s): GLUCAP in the last 168 hours. Lipid Profile: No results for input(s): CHOL, HDL, LDLCALC, TRIG, CHOLHDL, LDLDIRECT in the last 72 hours. Thyroid Function Tests: No results for input(s): TSH, T4TOTAL, FREET4, T3FREE, THYROIDAB in the last 72 hours. Anemia Panel: No results for input(s): VITAMINB12, FOLATE, FERRITIN, TIBC, IRON, RETICCTPCT in the last 72 hours.  Urine analysis:    Component Value Date/Time   COLORURINE YELLOW 02/26/2019 0205   APPEARANCEUR CLEAR 02/26/2019 0205   LABSPEC 1.025 02/26/2019 0205   PHURINE 6.0 02/26/2019 0205   GLUCOSEU NEGATIVE 02/26/2019 0205   HGBUR SMALL (A) 02/26/2019 0205   BILIRUBINUR NEGATIVE 02/26/2019 0205   KETONESUR NEGATIVE 02/26/2019 0205   PROTEINUR NEGATIVE 02/26/2019 0205   UROBILINOGEN 0.2 02/07/2015 1230   NITRITE NEGATIVE 02/26/2019 0205   LEUKOCYTESUR NEGATIVE 02/26/2019 0205   Sepsis Labs: _0 (procalcitonin:4,lacticidven:4)  ) Recent Results (from the past 240 hour(s))  SARS Coronavirus 2 Ag (30 min TAT) - Nasal Swab (BD Veritor Kit)     Status: None   Collection Time: 04/23/19  9:53 PM   Specimen: Nasal Swab (BD Veritor Kit)  Result Value Ref Range Status   SARS Coronavirus 2 Ag NEGATIVE NEGATIVE Final    Comment: (NOTE) SARS-CoV-2 antigen NOT DETECTED.  Negative results are presumptive.  Negative results do not preclude SARS-CoV-2 infection and should not be used as the sole basis for treatment or other patient management decisions, including infection  control decisions, particularly in the presence of clinical signs and  symptoms consistent with COVID-19, or in those who have been in contact with the virus.  Negative results must be combined with clinical observations, patient history, and epidemiological information. The  expected result is Negative. Fact Sheet for Patients: PodPark.tn Fact Sheet for Healthcare Providers: GiftContent.is This test is not yet approved or cleared by the [place] FDA and  has been authorized for detection and/or diagnosis of SARS-CoV-2 by FDA under an Emergency Use Authorization (EUA).  This EUA will remain in effect (meaning this test can be used) for the duration of  the COVID-19 de claration under Section 564(b)(1) of the Act, 21 U.S.C. section 360bbb-3(b)(1), unless the authorization is terminated or revoked sooner. Performed at [facility], [address] [place], [place] [address]   SARS CORONAVIRUS 2 (TAT 6-24 HRS) Nasopharyngeal Nasopharyngeal Swab     Status: None   Collection Time: 04/24/19  2:15 AM   Specimen: Nasopharyngeal Swab  Result Value Ref Range Status   SARS Coronavirus 2 NEGATIVE NEGATIVE Final    Comment: (NOTE) SARS-CoV-2 target nucleic acids are NOT DETECTED. The SARS-CoV-2 RNA is generally detectable in upper and lower respiratory specimens during the acute phase of infection. Negative results do not preclude SARS-CoV-2 infection, do not rule out co-infections with other pathogens, and should not be used as the sole basis for treatment or other patient management decisions. Negative results must be combined with clinical observations, patient history, and epidemiological information. The expected result is Negative. Fact Sheet for Patients: SugarRoll.be Fact Sheet for Healthcare Providers: https://www.woods-mathews.com/ This test is not yet approved or cleared by the [place] FDA and  has been authorized for detection and/or diagnosis of SARS-CoV-2 by FDA under an Emergency Use Authorization (EUA). This EUA will remain  in effect (meaning this test can be used) for the duration of the COVID-19 declaration under Section 56  4(b)(1) of the Act, 21 U.S.C. section 360bbb-3(b)(1), unless the authorization is terminated or revoked sooner. Performed at [facility], [place] [address] [place], [place] [address]       Studies: CT Angio Abd/Pel w/ and/or w/o  Result Date: 04/25/2019 CLINICAL DATA:  Rectal bleeding EXAM: CTA ABDOMEN AND PELVIS WITHOUT AND WITH CONTRAST TECHNIQUE: Multidetector CT imaging of the abdomen and pelvis was performed using the standard protocol during bolus administration of intravenous  contrast. Multiplanar reconstructed images and MIPs were obtained and reviewed to evaluate the vascular anatomy. CONTRAST:  174m OMNIPAQUE IOHEXOL 350 MG/ML SOLN COMPARISON:  04/23/2019 FINDINGS: VASCULAR Aorta: Minimal plaque. Normal caliber aorta without aneurysm, dissection, vasculitis or significant stenosis. Celiac: Patent without evidence of aneurysm, dissection, vasculitis or significant stenosis. SMA: Patent without evidence of aneurysm, dissection, vasculitis or significant stenosis. Renals: Single left, widely patent. Duplicated right, superior dominant, both patent. IMA: Patent without evidence of aneurysm, dissection, vasculitis or significant stenosis. Inflow: Minimal plaque. No aneurysm, dissection, stenosis, or active extravasation. Proximal Outflow: Minimal plaque. Bilateral common femoral and visualized portions of the superficial and profunda femoral arteries are patent without evidence of aneurysm, dissection, vasculitis or significant stenosis. Veins: Patent hepatic veins, portal vein, splenic vein, SMV, IVC, renal veins. Iliac venous system unremarkable. No venous pathology identified. Review of the MIP images confirms the above findings. NON-VASCULAR Lower chest: No pleural or pericardial effusion. Distal esophagus decompressed. Hepatobiliary: Hyperdense material in the nondilated gallbladder presumably vicarious excretion of previously administered contrast material. No liver lesion or biliary  ductal dilatation. Pancreas: Unremarkable. No pancreatic ductal dilatation or surrounding inflammatory changes. Spleen: Normal in size without focal abnormality. Adrenals/Urinary Tract: Adrenal glands unremarkable. Kidneys enhance symmetrically, without hydronephrosis or urolithiasis evident. Urinary bladder physiologically distended. Stomach/Bowel: Stomach is nondistended. Small bowel decompressed. Appendix contains appendicular lith without inflammatory change or dilatation. The colon is nondilated, with innumerable diverticula throughout, most numerous in the sigmoid segment. No active extravasation identified. No adjacent inflammatory/edematous change or abscess. Lymphatic: No abdominal or pelvic adenopathy. Reproductive: Prostate is unremarkable. Other: Left pelvic phleboliths. No ascites. No free air. Musculoskeletal: Multilevel thoracolumbar spondylitic change. Grade 1 anterolisthesis L4-5 probably related to advanced facet DJD. Bilateral hip DJD. Negative for fracture or worrisome bone lesion. IMPRESSION: VASCULAR 1. No evidence of active extravasation. 2. Aortoiliac atherosclerosis (ICD10-170.0) without aneurysm, dissection, or significant stenosis. NON-VASCULAR 1. Extensive colonic diverticulosis. 2. 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 Electronically Signed   By: DLucrezia EuropeM.D.   On: 04/25/2019 10:13    Scheduled Meds: . mometasone-formoterol  2 puff Inhalation BID  . pantoprazole  40 mg Oral Daily  . sodium chloride (PF)      . sodium chloride flush  3 mL Intravenous Once  . sodium chloride flush  3 mL Intravenous Q12H  . umeclidinium bromide  1 puff Inhalation Daily    Continuous Infusions: . sodium chloride 75 mL/hr at 04/25/19 0513     LOS: 1 day     CKayleen Memos MD [facility] Pager [phone] If 7PM-7AM, please contact night-coverage [email or web address] Password [email or web address]1/17/2021, 11:33 AM

## 2019-04-25 NOTE — Progress Notes (Signed)
Repeat hgb 7.8. Pt has not had any BM's, or signs and symptoms of bleeding thus far during shift.  On call provider made aware. New order to transfuse one unit of blood. Will carry out new orders and continue to monitor patient closely.

## 2019-04-25 NOTE — Progress Notes (Signed)
Patient ID: [name], male   DOB: 1960-05-01, 59 y.o.   MRN: [id number]    Progress Note   Subjective  Day # 2 CC; acute GI bleed  Hemoglobin down to 7.8 last night-transfused 1 unit-hemoglobin 8.7 this a.m  CT angio was done earlier this morning,-not resulted  Patient has not had any bowel movements since admission  He continued to feel lightheaded and dizzy last night, tachycardia with standing.  Feels okay this morning still weak.  No abdominal pain.  He states that he spoke with Dr. Le//[facility] yesterday, who would like to speak with GI attending here     Objective   Vital signs in last 24 hours: Temp:  [98 F (36.7 C)-98.5 F (36.9 C)] 98 F (36.7 C) (01/17 0450) Pulse Rate:  [76-99] 99 (01/17 0450) Resp:  [18-20] 18 (01/17 0450) BP: (114-142)/(79-91) 124/84 (01/17 0450) SpO2:  [97 %-100 %] 97 % (01/17 0450) Last BM Date: 04/23/19 General:    AA male in NAD Heart: tachy Regular rate and rhythm; no murmurs Lungs: Respirations even and unlabored, lungs CTA bilaterally Abdomen:  Soft, nontender and nondistended. Normal bowel sounds. Extremities:  Without edema. Neurologic:  Alert and oriented,  grossly normal neurologically. Psych:  Cooperative. Normal mood and affect.  Intake/Output from previous day: 01/16 0701 - 01/17 0700 In: 1881.3 [P.O.:720; I.V.:771.3; Blood:390] Out: 800 [Urine:800] Intake/Output this shift: No intake/output data recorded.  Lab Results: Recent Labs    04/23/19 1643 04/23/19 1643 04/24/19 0013 04/24/19 0555 04/24/19 1634 04/24/19 2302 04/25/19 0723  WBC 8.5  --  7.8  --   --   --  8.1  HGB 12.4*   < > 10.2*   < > 9.0* 7.8* 8.7*  HCT 40.1   < > 33.5*   < > 29.0* 25.5* 28.3*  PLT 277  --  246  --   --   --  191   < > = values in this interval not displayed.   BMET Recent Labs    04/23/19 1643 04/24/19 0555 04/25/19 0723  NA 140 141 140  K 3.4* 3.7 3.6  CL 108 110 110  CO2 23 23 23   GLUCOSE 116* 119* 98    BUN 14 15 11   CREATININE 1.29* 1.23 1.33*  CALCIUM 9.4 8.5* 8.2*   LFT Recent Labs    04/25/19 0723  PROT 6.1*  ALBUMIN 3.2*  AST 20  ALT 16  ALKPHOS 52  BILITOT 1.0   PT/INR No results for input(s): LABPROT, INR in the last 72 hours.  Studies/Results: DG Chest 2 View  Result Date: 04/23/2019 CLINICAL DATA:  Chest pain EXAM: CHEST - 2 VIEW COMPARISON:  08/26/2018 FINDINGS: The heart size and mediastinal contours are within normal limits. Both lungs are clear. Diffuse osteophytosis of the thoracic spine. IMPRESSION: No active cardiopulmonary disease. Electronically Signed   By: Donavan Foil M.D.   On: 04/23/2019 17:16   CT Head Wo Contrast  Result Date: 04/24/2019 CLINICAL DATA:  Unwitnessed fall in emergency department tonight. Left frontal laceration and headache. EXAM: CT HEAD WITHOUT CONTRAST TECHNIQUE: Contiguous axial images were obtained from the base of the skull through the vertex without intravenous contrast. COMPARISON:  Head CT 03/14/2017 FINDINGS: Brain: No intracranial hemorrhage, mass effect, or midline shift. No hydrocephalus. Partially empty sella, unchanged from prior. The basilar cisterns are patent. Mild chronic small vessel ischemia, similar to prior. No evidence of territorial infarct or acute ischemia. No extra-axial or intracranial fluid collection.  Vascular: No hyperdense vessel or unexpected calcification. Skull: No fracture or focal lesion. Sinuses/Orbits: Atherosclerosis of skullbase vasculature without hyperdense vessel or abnormal calcification. Other: None. IMPRESSION: No acute intracranial abnormality. Electronically Signed   By: Keith Rake M.D.   On: 04/24/2019 00:48   CT Cervical Spine Wo Contrast  Result Date: 04/24/2019 CLINICAL DATA:  Unwitnessed fall in the emergency department tonight. Left frontal laceration and headaches. EXAM: CT CERVICAL SPINE WITHOUT CONTRAST TECHNIQUE: Multidetector CT imaging of the cervical spine was performed  without intravenous contrast. Multiplanar CT image reconstructions were also generated. COMPARISON:  None. FINDINGS: Alignment: Straightening of normal lordosis. No traumatic subluxation. Skull base and vertebrae: No acute fracture. Vertebral body heights are maintained. The dens and skull base are intact. Soft tissues and spinal canal: No prevertebral fluid or swelling. No visible canal hematoma. Disc levels: Mild diffuse disc space narrowing and endplate spurring. Scattered facet hypertrophy. Upper chest: No acute findings. Slight heterogeneous thyroid gland without dominant nodule. Other: None. IMPRESSION: Mild degenerative change in the cervical spine without acute fracture or traumatic subluxation. Electronically Signed   By: Keith Rake M.D.   On: 04/24/2019 00:56   CT ABDOMEN PELVIS W CONTRAST  Result Date: 04/23/2019 CLINICAL DATA:  Rectal bleeding EXAM: CT ABDOMEN AND PELVIS WITH CONTRAST TECHNIQUE: Multidetector CT imaging of the abdomen and pelvis was performed using the standard protocol following bolus administration of intravenous contrast. CONTRAST:  170mL OMNIPAQUE IOHEXOL 300 MG/ML  SOLN COMPARISON:  02/26/2019 FINDINGS: Lower chest: Lung bases are clear. No effusions. Heart is normal size. Hepatobiliary: Diffuse low-density throughout the liver compatible with fatty infiltration. No focal abnormality. Gallbladder unremarkable. Pancreas: No focal abnormality or ductal dilatation. Spleen: No focal abnormality.  Normal size. Adrenals/Urinary Tract: No adrenal abnormality. No focal renal abnormality. No stones or hydronephrosis. Urinary bladder is unremarkable. Stomach/Bowel: Diffuse colonic diverticulosis. This is most pronounced in the descending colon and sigmoid colon. No active diverticulitis. Normal appendix. Stomach and small bowel decompressed, unremarkable. Vascular/Lymphatic: No evidence of aneurysm or adenopathy. Reproductive: No visible focal abnormality. Other: No free fluid or  free air. Musculoskeletal: No acute bony abnormality. Degenerative disc and facet disease in the lumbar spine. IMPRESSION: Diffuse colonic diverticulosis, most pronounced in the left colon. No active diverticulitis. Fatty infiltration of the liver. Electronically Signed   By: Rolm Baptise M.D.   On: 04/23/2019 19:47       Assessment / Plan:    #68 59 year old African-American male with acute lower GI bleed with syncope, consistent with diverticular hemorrhage.  This occurred in the setting of Eliquis  Hemoglobin drifted down to 7.8 last night and was transfused 1 unit. No evidence of active bleeding, no bowel movement since admit.  CT angio pending this morning  #2 orthostasis-secondary to hemorrhage /volume depletion #3 history of atrial fibrillation-on chronic Eliquis  #4 previous GI work-up May 2020 for intermittent melena was negative including enteroscopy capsule endoscopy and colonoscopy. Patient has more recently seen [place], and repeat capsule endoscopy was done which by patient report shows proximal small bowel AVMs.  He is scheduled to undergo enteroscopy with ablation later this week.  Plan; full liquid diet Continue serial hemoglobins and transfuse to keep hemoglobin above 8 as patient has been very symptomatic If continues with orthostatic symptoms bolus fluids Continue to hold Eliquis We will follow up on CT angio    Principal Problem:   Acute lower GI bleeding Active Problems:   HTN (hypertension)   Hypokalemia   CKD (chronic  kidney disease) stage 3, GFR 30-59 ml/min   PAF (paroxysmal atrial fibrillation) (HCC)   COPD (chronic obstructive pulmonary disease) (HCC)   Syncope   Chronic anticoagulation   Acute blood loss anemia     LOS: 1 day   Biannca Scantlin EsterwoodPA-C  04/25/2019, 8:39 AM

## 2019-04-25 NOTE — Progress Notes (Signed)
Called patient's daughter Mardene Celeste to give updates.  No answer.  No voicemail set up.  Will call again.

## 2019-04-25 NOTE — Progress Notes (Signed)
Patient's GI Doctor, Dr. Truman Hayward at [facility] [phone]

## 2019-04-26 DIAGNOSIS — K5520 Angiodysplasia of colon without hemorrhage: Secondary | ICD-10-CM

## 2019-04-26 DIAGNOSIS — K5731 Diverticulosis of large intestine without perforation or abscess with bleeding: Principal | ICD-10-CM

## 2019-04-26 LAB — TYPE AND SCREEN
ABO/RH(D): O POS
Antibody Screen: NEGATIVE
Unit division: 0

## 2019-04-26 LAB — HEMOGLOBIN AND HEMATOCRIT, BLOOD
HCT: 29.9 % — ABNORMAL LOW (ref 39.0–52.0)
HCT: 29.5 % — ABNORMAL LOW (ref 39.0–52.0)
Hemoglobin: 9.4 g/dL — ABNORMAL LOW (ref 13.0–17.0)
Hemoglobin: 9.1 g/dL — ABNORMAL LOW (ref 13.0–17.0)

## 2019-04-26 LAB — COMPREHENSIVE METABOLIC PANEL
ALT: 15 U/L (ref 0–44)
AST: 22 U/L (ref 15–41)
Albumin: 3.4 g/dL — ABNORMAL LOW (ref 3.5–5.0)
Alkaline Phosphatase: 53 U/L (ref 38–126)
Anion gap: 6 (ref 5–15)
BUN: 9 mg/dL (ref 6–20)
CO2: 24 mmol/L (ref 22–32)
Calcium: 8.6 mg/dL — ABNORMAL LOW (ref 8.9–10.3)
Chloride: 111 mmol/L (ref 98–111)
Creatinine, Ser: 1.22 mg/dL (ref 0.61–1.24)
GFR calc Af Amer: >60 mL/min (ref >60)
GFR calc non Af Amer: >60 mL/min (ref >60)
Glucose, Bld: 102 mg/dL — ABNORMAL HIGH (ref 70–99)
Potassium: 3.7 mmol/L (ref 3.5–5.1)
Sodium: 141 mmol/L (ref 135–145)
Total Bilirubin: 1.1 mg/dL (ref 0.3–1.2)
Total Protein: 6.4 g/dL — ABNORMAL LOW (ref 6.5–8.1)

## 2019-04-26 LAB — CBC
HCT: 28.4 % — ABNORMAL LOW (ref 39.0–52.0)
Hemoglobin: 8.8 g/dL — ABNORMAL LOW (ref 13.0–17.0)
MCH: 23.3 pg — ABNORMAL LOW (ref 26.0–34.0)
MCHC: 31 g/dL (ref 30.0–36.0)
MCV: 75.1 fL — ABNORMAL LOW (ref 80.0–100.0)
Platelets: 200 10*3/uL (ref 150–400)
RBC: 3.78 MIL/uL — ABNORMAL LOW (ref 4.22–5.81)
RDW: 19.6 % — ABNORMAL HIGH (ref 11.5–15.5)
WBC: 9.1 10*3/uL (ref 4.0–10.5)
nRBC: 0 % (ref 0.0–0.2)

## 2019-04-26 LAB — BPAM RBC
Blood Product Expiration Date: 202102122359
ISSUE DATE / TIME: 202101170125
Unit Type and Rh: 5100

## 2019-04-26 LAB — IRON AND TIBC
Iron: 25 ug/dL — ABNORMAL LOW (ref 45–182)
Saturation Ratios: 8 % — ABNORMAL LOW (ref 17.9–39.5)
TIBC: 319 ug/dL (ref 250–450)
UIBC: 294 ug/dL

## 2019-04-26 LAB — FERRITIN: Ferritin: 6 ng/mL — ABNORMAL LOW (ref 24–336)

## 2019-04-26 LAB — VITAMIN B12: Vitamin B-12: 165 pg/mL — ABNORMAL LOW (ref 180–914)

## 2019-04-26 MED ORDER — POLYETHYLENE GLYCOL 3350 17 G PO PACK
17.0000 g | PACK | Freq: Every day | ORAL | Status: DC
  Administered 2019-04-26 – 2019-04-27 (×2): 17 g via ORAL
  Filled 2019-04-26 (×2): qty 1

## 2019-04-26 MED ORDER — FERROUS SULFATE 325 (65 FE) MG PO TABS
325.0000 mg | ORAL_TABLET | Freq: Every day | ORAL | Status: DC
  Administered 2019-04-27 – 2019-04-29 (×3): 325 mg via ORAL
  Filled 2019-04-26 (×3): qty 1

## 2019-04-26 MED ORDER — SODIUM CHLORIDE 0.9 % IV SOLN
510.0000 mg | Freq: Once | INTRAVENOUS | Status: AC
  Administered 2019-04-26: 510 mg via INTRAVENOUS
  Filled 2019-04-26: qty 17

## 2019-04-26 NOTE — Evaluation (Signed)
Physical Therapy Evaluation Patient Details Name: [name] MRN: [id number] DOB: 12-02-1960 Today's Date: 04/26/2019   History of Present Illness  59 yo male admitted to ED on 1/15 with acute lower GIB, with history of pt-reported heart palpitations x1 month. Pt with syncope and fall this hospitalization, CT head negative for acute findings but with forehead laceration. PMH includes PAF on Xarelto, hypertension, COPD, sickle cell trait.  Clinical Impression   Pt presents with mild unsteadiness with higher level balance tasks, headache and L blurry vision s/p fall, HTN with mobility (up to 160s/111) and decreased activity tolerance. Pt to benefit from acute PT to address deficits. Pt ambulated hallway distance without AD with slow and steady gait. Pt educated on signs and symptoms of concussion to monitor s/p fall, pt reporting headache, periods of blurriness in L eye, and difficulty focusing at times. PT to continue to work with pt in acute setting to monitor and address concussive symptoms and progress mobility as tolerated by pt. No PT follow up indicated at this time. PT will continue to follow acutely.      Follow Up Recommendations No PT follow up    Equipment Recommendations  None recommended by PT    Recommendations for Other Services       Precautions / Restrictions Precautions Precautions: Fall Restrictions Weight Bearing Restrictions: No      Mobility  Bed Mobility Overal bed mobility: Needs Assistance Bed Mobility: Supine to Sit     Supine to sit: Supervision;HOB elevated     General bed mobility comments: supervision for safety, increased time.  Transfers Overall transfer level: Needs assistance Equipment used: None Transfers: Sit to/from Stand Sit to Stand: Supervision;From elevated surface         General transfer comment: supervision for safety, increased time to rise and self-steady.  Ambulation/Gait Ambulation/Gait assistance:  Supervision;Min guard Gait Distance (Feet): 130 Feet Assistive device: None Gait Pattern/deviations: Step-through pattern;Decreased stride length;Narrow base of support Gait velocity: decr   General Gait Details: supervision for safety, occasional min guard when challenging pt's dynamic standing balance. Slow and steady gait.  Stairs            Wheelchair Mobility    Modified Rankin (Stroke Patients Only)       Balance Overall balance assessment: Needs assistance;History of Falls(due to syncope)   Sitting balance-Leahy Scale: Good     Standing balance support: No upper extremity supported;During functional activity Standing balance-Leahy Scale: Good               High level balance activites: Head turns;Turns High Level Balance Comments: mild lateral gait deviations with vertical and horizontal head turning, direction changes in hallway. Min verbal cuing for safe navigation of hallway obstacles.             Pertinent Vitals/Pain Pain Assessment: 0-10 Pain Score: 3  Pain Location: head Pain Descriptors / Indicators: Headache Pain Intervention(s): Limited activity within patient's tolerance;Monitored during session;Premedicated before session    Home Living Family/patient expects to be discharged to:: Private residence Living Arrangements: Children Available Help at Discharge: Family;Available PRN/intermittently Type of Home: House Home Access: Level entry     Home Layout: One level Home Equipment: Crutches      Prior Function Level of Independence: Independent         Comments: pt does everything for self, drives. Pt reports his children can assist him if needed, but does not require any assistance PTA.     Hand Dominance  Dominant Hand: Right    Extremity/Trunk Assessment   Upper Extremity Assessment Upper Extremity Assessment: Overall WFL for tasks assessed    Lower Extremity Assessment Lower Extremity Assessment: Overall WFL for  tasks assessed    Cervical / Trunk Assessment Cervical / Trunk Assessment: Normal  Communication   Communication: No difficulties  Cognition Arousal/Alertness: Awake/alert Behavior During Therapy: WFL for tasks assessed/performed Overall Cognitive Status: Within Functional Limits for tasks assessed                                        General Comments General comments (skin integrity, edema, etc.): Smooth pursuits WFL, pt states his left eye is periodically blurry with tear-streaming since fall. Sitting BP and HR:165/101, 66 bpm. Post-ambulation BP and HR:161/111, 65 bpm.    Exercises     Assessment/Plan    PT Assessment Patient needs continued PT services  PT Problem List Decreased activity tolerance;Pain;Cardiopulmonary status limiting activity;Decreased mobility       PT Treatment Interventions Therapeutic activities;Gait training;Therapeutic exercise;Patient/family education;Balance training;Stair training;Functional mobility training;Neuromuscular re-education    PT Goals (Current goals can be found in the Care Plan section)  Acute Rehab PT Goals Patient Stated Goal: go home, feel better PT Goal Formulation: With patient Time For Goal Achievement: 05/10/19 Potential to Achieve Goals: Good    Frequency Min 3X/week   Barriers to discharge        Co-evaluation               AM-PAC PT "6 Clicks" Mobility  Outcome Measure Help needed turning from your back to your side while in a flat bed without using bedrails?: None Help needed moving from lying on your back to sitting on the side of a flat bed without using bedrails?: None Help needed moving to and from a bed to a chair (including a wheelchair)?: None Help needed standing up from a chair using your arms (e.g., wheelchair or bedside chair)?: None Help needed to walk in hospital room?: A Little Help needed climbing 3-5 steps with a railing? : A Little 6 Click Score: 22    End of Session  Equipment Utilized During Treatment: Gait belt Activity Tolerance: Patient tolerated treatment well;Patient limited by fatigue Patient left: in chair;with call bell/phone within reach(pt verbally agrees to press call button and wait for assist prior to mobilizing back to bed from recliner) Nurse Communication: Mobility status;Other (comment)(hypertension) PT Visit Diagnosis: Unsteadiness on feet (R26.81);Difficulty in walking, not elsewhere classified (R26.2)    Time: [id number] PT Time Calculation (min) (ACUTE ONLY): 16 min   Charges:   PT Evaluation $PT Eval Low Complexity: 1 Low         Azaiah Mello E, PT Acute Rehabilitation Services Pager [phone]  Office [phone]   Jerri Glauser D Elonda Husky 04/26/2019, 12:03 PM

## 2019-04-26 NOTE — Consult Note (Addendum)
     Progress Note    ASSESSMENT AND PLAN:    73. 59 yo male with lower GI bleed / symptomatic anemia on Eliquis. Presumed diverticular hemorrhage. Though he did have associated lower abdominal pain there is no evidence for ischemic colitis on imaging.  -Reports two small BMs with fresh appearing blood yesterday. No BMs or bleeding today. CT angio of abd / pelvis yesterday was negative for active bleeding. -Tolerating soft diet. Monitor today for any further bleeding  2. Sympomatic acute on chronic anemia secondary to #1. He is s/p one unit blood on 1/17.  Had slightly overzealous response to transfusion ( 7.8 >>> 9.4). Hgb down some today at 8.8 today but probably equilibrating. His baseline hgb is ~ 12.       [facility] Attending   I have taken an interval history, reviewed the chart and examined the patient. I agree with the Advanced Practitioner's note, impression and recommendations.     No stools since yesterday. 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 in [facility]   Rec he hold his Eliquis x 1 week after dc and if no bleeding then restart  Gatha Mayer, MD, [facility] 04/26/2019 7:06 PM  [id number]   SUBJECTIVE   No complaints. Tolerating soft diet. No BMs / bleeding today. Yesterday had two small stools with blood ( looked fresh to him.    OBJECTIVE:     Vital signs in last 24 hours: Temp:  [98.1 F (36.7 C)-98.4 F (36.9 C)] 98.1 F (36.7 C) (01/18 0555) Pulse Rate:  [64-84] 64 (01/18 0555) Resp:  [16-20] 16 (01/18 0555) BP: (110-157)/(68-110) 143/98 (01/18 0555) SpO2:  [97 %-100 %] 97 % (01/18 0751) Last BM Date: 04/25/19(bright red blood per pt) General:   Alert, well-developed male in NAD Heart:  Regular rate and rhythm;  No lower extremity edema   Pulm: Normal respiratory effort   Abdomen:  Soft, nondistended, nontender.  Normal bowel  sounds.          Neurologic:  Alert and  oriented x4;  grossly normal neurologically. Psych:  Pleasant, cooperative.  Normal mood and affect.    Lab Results: Recent Labs    04/24/19 0013 04/24/19 0555 04/25/19 0723 04/25/19 0723 04/25/19 1038 04/25/19 1702 04/26/19 0136  WBC 7.8  --  8.1  --   --   --  9.1  HGB 10.2*   < > 8.7*   < > 9.2* 9.4* 8.8*  HCT 33.5*   < > 28.3*   < > 29.8* 29.8* 28.4*  PLT 246  --  191  --   --   --  200   < > = values in this interval not displayed.      LOS: 2 days   Tye Savoy ,NP 04/26/2019, 9:00 AM

## 2019-04-26 NOTE — Progress Notes (Signed)
PROGRESS NOTE  [name] [id number] DOB: December 20, 1960 DOA: 04/23/2019 PCP: [name], MD  HPI/Recap of past 24 hours: [name] is a 59 y.o. male with medical history significant for PAF on Xarelto, hypertension, COPD, now presenting to the emergency department for evaluation of acute onset of rectal bleeding.  In his usual state of health until approximately 2:30 PM on 04/23/2019 when he had a sudden urge to move his bowels, but saw mainly just red blood in the toilet bowl.  + FOBT with sharp drop in Hg.  Syncope in the ED. CT head and neck non acute.   Admitted for evaluation of acute lower GI bleed.  04/26/19: Seen and examined.  He has no new complaints today.  He wants to know when he can restart Linzess.  Defer to GI since <1% can develop hematochezia which he presented with.  Repeat H&H 9.4 this morning.  Iron studies suggestive of significant iron deficiency.  Ordered IV Feraheme x 1 dose and started oral supplement.    Assessment/Plan: Principal Problem:   Acute lower GI bleeding Active Problems:   HTN (hypertension)   Hypokalemia   CKD (chronic kidney disease) stage 3, GFR 30-59 ml/min   PAF (paroxysmal atrial fibrillation) (HCC)   COPD (chronic obstructive pulmonary disease) (HCC)   Syncope   Chronic anticoagulation   Acute blood loss anemia  Acute lower GI bleeding; microcytic anemia  - Presents with painless hematochezia, had several more episodes in ED and developed some lower abd discomfort and syncope  + FOBT Repeated H&H stable this morning 9.4/29.9. Blood pressure stable maintaining MAP greater than 65 with IV fluid. Appreciate GI assistance.  Acute blood loss anemia in the setting of recent lower GI bleed Received 1 U PRBC with improvement Hg 8.7>> 9.4 Transfuse hg < 8 CT A abd pelvis unrevealing GI following. Appreciate GI assistance  Iron deficiency anemia Iron studies suggestive of profound iron deficiency Ordered Feraheme 510 mg x  1 dose Start ferrous sulfate p.o. 325 mg daily  Syncope  likely 2/2 to orthostatic hypotension - Patient developed acute nausea, sweats, and lightheadedness while having hematochezia in ED and suffered a syncopal episode, hitting his head and lacerating frontal scalp  - Likely vasovagal or orthostatic given the clinical scenario - Head CT was negative, Tdap updated, and laceration repaired in ED  Orthostatic vital signs much improved and normalized. Continue fall precautions  IBS with constipation Linzess held due to acute lower GI bleed; could cause hematochezia in less than 1% Defer to GI to restart Linzess.  Paroxysmal atrial fibrillation In sinus rhythm. - Continue to hold Xarelto as above    CKD II Creatinine is at its baseline 1.2 with GFR greater than 60. Continue to avoid nephrotoxins and hypotension Continue to monitor urine output  Hypertension  Blood pressure is currently at goal Restarted Coreg 3.125 mg daily.   DVT prophylaxis: SCD's, Xarelto on hold Code Status: Full  Family Communication: None at bedside Consults called: GI [facility]   Disposition: We will plan to discharge when GI signs off.   Objective: Vitals:   04/25/19 2103 04/26/19 0555 04/26/19 0751 04/26/19 0921  BP: 110/68 (!) 143/98  128/88  Pulse: 76 64  65  Resp: 20 16    Temp: 98.1 F (36.7 C) 98.1 F (36.7 C)    TempSrc: Oral Oral    SpO2: 100% 99% 97%   Weight:      Height:  Intake/Output Summary (Last 24 hours) at 04/26/2019 1148 Last data filed at 04/26/2019 0600 Gross per 24 hour  Intake 480 ml  Output 1175 ml  Net -695 ml   Filed Weights   04/23/19 1614  Weight: 98 kg    Exam:  . General: 59 y.o. year-old male well-developed well-nourished in no distress.  Alert oriented x3.   . Cardiovascular: Regular rate and rhythm no rubs or gallops. Marland Kitchen Respiratory: Clear to auscultation no wheezes no rales.   . Abdomen: Soft normal bowel sounds present. Musculoskeletal:  No lower extremity edema bilaterally.   Psychiatry: Mood is appropriate for condition and setting.  Data Reviewed: CBC: Recent Labs  Lab 04/23/19 1643 04/23/19 1643 04/24/19 0013 04/24/19 0555 04/25/19 0723 04/25/19 1038 04/25/19 1702 04/26/19 0136 04/26/19 0926  WBC 8.5  --  7.8  --  8.1  --   --  9.1  --   HGB 12.4*   < > 10.2*   < > 8.7* 9.2* 9.4* 8.8* 9.4*  HCT 40.1   < > 33.5*   < > 28.3* 29.8* 29.8* 28.4* 29.9*  MCV 71.4*  --  72.2*  --  74.5*  --   --  75.1*  --   PLT 277  --  246  --  191  --   --  200  --    < > = values in this interval not displayed.   Basic Metabolic Panel: Recent Labs  Lab 04/23/19 1643 04/24/19 0555 04/25/19 0723 04/26/19 0136  NA 140 141 140 141  K 3.4* 3.7 3.6 3.7  CL 108 110 110 111  CO2 _0 GLUCOSE 116* 119* 98 102*  BUN _1 CREATININE 1.29* 1.23 1.33* 1.22  CALCIUM 9.4 8.5* 8.2* 8.6*  MG  --  2.0  --   --    GFR: Estimated Creatinine Clearance: 76.9 mL/min (by C-G formula based on SCr of 1.22 mg/dL). Liver Function Tests: Recent Labs  Lab 04/24/19 0555 04/25/19 0723 04/26/19 0136  AST _2 ALT _3 ALKPHOS 62 52 53  BILITOT 0.8 1.0 1.1  PROT 6.7 6.1* 6.4*  ALBUMIN 3.5 3.2* 3.4*   No results for input(s): LIPASE, AMYLASE in the last 168 hours. No results for input(s): AMMONIA in the last 168 hours. Coagulation Profile: No results for input(s): INR, PROTIME in the last 168 hours. Cardiac Enzymes: No results for input(s): CKTOTAL, CKMB, CKMBINDEX, TROPONINI in the last 168 hours. BNP (last 3 results) No results for input(s): PROBNP in the last 8760 hours. HbA1C: No results for input(s): HGBA1C in the last 72 hours. CBG: No results for input(s): GLUCAP in the last 168 hours. Lipid Profile: No results for input(s): CHOL, HDL, LDLCALC, TRIG, CHOLHDL, LDLDIRECT in the last 72 hours. Thyroid Function Tests: No results for input(s): TSH, T4TOTAL, FREET4, T3FREE, THYROIDAB in the last 72  hours. Anemia Panel: Recent Labs    04/26/19 0926  VITAMINB12 165*  FERRITIN 6*  TIBC 319  IRON 25*   Urine analysis:    Component Value Date/Time   COLORURINE YELLOW 02/26/2019 0205   APPEARANCEUR CLEAR 02/26/2019 0205   LABSPEC 1.025 02/26/2019 0205   PHURINE 6.0 02/26/2019 0205   GLUCOSEU NEGATIVE 02/26/2019 0205   HGBUR SMALL (A) 02/26/2019 0205   BILIRUBINUR NEGATIVE 02/26/2019 0205   KETONESUR NEGATIVE 02/26/2019 0205   PROTEINUR NEGATIVE 02/26/2019 0205   UROBILINOGEN 0.2 02/07/2015 1230   NITRITE NEGATIVE 02/26/2019 0205  LEUKOCYTESUR NEGATIVE 02/26/2019 0205   Sepsis Labs: _0 (procalcitonin:4,lacticidven:4)  ) Recent Results (from the past 240 hour(s))  SARS Coronavirus 2 Ag (30 min TAT) - Nasal Swab (BD Veritor Kit)     Status: None   Collection Time: 04/23/19  9:53 PM   Specimen: Nasal Swab (BD Veritor Kit)  Result Value Ref Range Status   SARS Coronavirus 2 Ag NEGATIVE NEGATIVE Final    Comment: (NOTE) SARS-CoV-2 antigen NOT DETECTED.  Negative results are presumptive.  Negative results do not preclude SARS-CoV-2 infection and should not be used as the sole basis for treatment or other patient management decisions, including infection  control decisions, particularly in the presence of clinical signs and  symptoms consistent with COVID-19, or in those who have been in contact with the virus.  Negative results must be combined with clinical observations, patient history, and epidemiological information. The expected result is Negative. Fact Sheet for Patients: PodPark.tn Fact Sheet for Healthcare Providers: GiftContent.is This test is not yet approved or cleared by the [place] FDA and  has been authorized for detection and/or diagnosis of SARS-CoV-2 by FDA under an Emergency Use Authorization (EUA).  This EUA will remain in effect (meaning this test can be used) for the duration of   the COVID-19 de claration under Section 564(b)(1) of the Act, 21 U.S.C. section 360bbb-3(b)(1), unless the authorization is terminated or revoked sooner. Performed at [facility], [address] [place], [place] [address]   SARS CORONAVIRUS 2 (TAT 6-24 HRS) Nasopharyngeal Nasopharyngeal Swab     Status: None   Collection Time: 04/24/19  2:15 AM   Specimen: Nasopharyngeal Swab  Result Value Ref Range Status   SARS Coronavirus 2 NEGATIVE NEGATIVE Final    Comment: (NOTE) SARS-CoV-2 target nucleic acids are NOT DETECTED. The SARS-CoV-2 RNA is generally detectable in upper and lower respiratory specimens during the acute phase of infection. Negative results do not preclude SARS-CoV-2 infection, do not rule out co-infections with other pathogens, and should not be used as the sole basis for treatment or other patient management decisions. Negative results must be combined with clinical observations, patient history, and epidemiological information. The expected result is Negative. Fact Sheet for Patients: SugarRoll.be Fact Sheet for Healthcare Providers: https://www.woods-mathews.com/ This test is not yet approved or cleared by the [place] FDA and  has been authorized for detection and/or diagnosis of SARS-CoV-2 by FDA under an Emergency Use Authorization (EUA). This EUA will remain  in effect (meaning this test can be used) for the duration of the COVID-19 declaration under Section 56 4(b)(1) of the Act, 21 U.S.C. section 360bbb-3(b)(1), unless the authorization is terminated or revoked sooner. Performed at [facility], [place] [address] [place], [place] [address]       Studies: No results found.  Scheduled Meds: . carvedilol  3.125 mg Oral Daily  . mometasone-formoterol  2 puff Inhalation BID  . pantoprazole  40 mg Oral Daily  . sodium chloride flush  3 mL Intravenous Once  . sodium chloride flush  3 mL  Intravenous Q12H  . umeclidinium bromide  1 puff Inhalation Daily    Continuous Infusions: . sodium chloride 10 mL/hr at 04/25/19 1723     LOS: 2 days     Kayleen Memos, MD [facility] Pager [phone]  If 7PM-7AM, please contact night-coverage [email or web address] Password [facility] 04/26/2019, 11:48 AM

## 2019-04-27 ENCOUNTER — Inpatient Hospital Stay (HOSPITAL_COMMUNITY): Payer: Medicare Other

## 2019-04-27 DIAGNOSIS — I422 Other hypertrophic cardiomyopathy: Secondary | ICD-10-CM

## 2019-04-27 LAB — HEMOGLOBIN AND HEMATOCRIT, BLOOD
HCT: 31.5 % — ABNORMAL LOW (ref 39.0–52.0)
HCT: 31.4 % — ABNORMAL LOW (ref 39.0–52.0)
Hemoglobin: 9.8 g/dL — ABNORMAL LOW (ref 13.0–17.0)
Hemoglobin: 9.8 g/dL — ABNORMAL LOW (ref 13.0–17.0)

## 2019-04-27 LAB — CBC
HCT: 30.2 % — ABNORMAL LOW (ref 39.0–52.0)
Hemoglobin: 9.4 g/dL — ABNORMAL LOW (ref 13.0–17.0)
MCH: 23.2 pg — ABNORMAL LOW (ref 26.0–34.0)
MCHC: 31.1 g/dL (ref 30.0–36.0)
MCV: 74.6 fL — ABNORMAL LOW (ref 80.0–100.0)
Platelets: 209 10*3/uL (ref 150–400)
RBC: 4.05 MIL/uL — ABNORMAL LOW (ref 4.22–5.81)
RDW: 19.5 % — ABNORMAL HIGH (ref 11.5–15.5)
WBC: 8.9 10*3/uL (ref 4.0–10.5)
nRBC: 0 % (ref 0.0–0.2)

## 2019-04-27 LAB — COMPREHENSIVE METABOLIC PANEL
ALT: 16 U/L (ref 0–44)
AST: 20 U/L (ref 15–41)
Albumin: 3.7 g/dL (ref 3.5–5.0)
Alkaline Phosphatase: 56 U/L (ref 38–126)
Anion gap: 6 (ref 5–15)
BUN: 9 mg/dL (ref 6–20)
CO2: 24 mmol/L (ref 22–32)
Calcium: 8.8 mg/dL — ABNORMAL LOW (ref 8.9–10.3)
Chloride: 110 mmol/L (ref 98–111)
Creatinine, Ser: 1.17 mg/dL (ref 0.61–1.24)
GFR calc Af Amer: >60 mL/min (ref >60)
GFR calc non Af Amer: >60 mL/min (ref >60)
Glucose, Bld: 98 mg/dL (ref 70–99)
Potassium: 3.6 mmol/L (ref 3.5–5.1)
Sodium: 140 mmol/L (ref 135–145)
Total Bilirubin: 0.9 mg/dL (ref 0.3–1.2)
Total Protein: 6.5 g/dL (ref 6.5–8.1)

## 2019-04-27 LAB — FOLATE RBC
Folate, Hemolysate: 355 ng/mL
Folate, RBC: 1191 ng/mL (ref >498)
Hematocrit: 29.8 % — ABNORMAL LOW (ref 37.5–51.0)

## 2019-04-27 LAB — ECHOCARDIOGRAM COMPLETE
Height: 71 in
Weight: 3456 oz

## 2019-04-27 MED ORDER — POLYETHYLENE GLYCOL 3350 17 G PO PACK
17.0000 g | PACK | Freq: Every day | ORAL | Status: DC
  Administered 2019-04-28 – 2019-04-29 (×2): 17 g via ORAL
  Filled 2019-04-27 (×2): qty 1

## 2019-04-27 MED ORDER — PROCHLORPERAZINE EDISYLATE 10 MG/2ML IJ SOLN
10.0000 mg | Freq: Four times a day (QID) | INTRAMUSCULAR | Status: AC | PRN
  Administered 2019-04-27: 10 mg via INTRAVENOUS
  Filled 2019-04-27: qty 2

## 2019-04-27 MED ORDER — DIPHENHYDRAMINE HCL 50 MG/ML IJ SOLN
25.0000 mg | Freq: Once | INTRAMUSCULAR | Status: AC
  Administered 2019-04-27: 25 mg via INTRAVENOUS
  Filled 2019-04-27: qty 1

## 2019-04-27 MED ORDER — SODIUM CHLORIDE 0.9 % IV SOLN: INTRAVENOUS | Status: DC

## 2019-04-27 MED ORDER — LINACLOTIDE 145 MCG PO CAPS
290.0000 ug | ORAL_CAPSULE | Freq: Every day | ORAL | Status: DC
  Filled 2019-04-27: qty 2

## 2019-04-27 NOTE — Progress Notes (Addendum)
[facility] Progress Note  CC:  GI bleed  Subjective:  Hgb 9.4 grams this AM.  Says that he still sees some red blood with bowel movements (says that it looks like fresh blood to him), says that he has seen some every time he's had a BM.  Had some issues with dizziness and tachycardia upon standing so he said the hospitalist mentioned cardiology evaluation.  BP is high as well.  He is asking about restarting his Linzess 290 mcg daily as he does not feel like he is having complete BM's.  Tolerating soft diet this AM without issues.  Objective:  Vital signs in last 24 hours: Temp:  [98.2 F (36.8 C)-98.4 F (36.9 C)] 98.2 F (36.8 C) (01/19 0438) Pulse Rate:  [64-77] 64 (01/19 0438) Resp:  [14-18] 18 (01/19 0438) BP: (128-144)/(86-105) 144/105 (01/19 0438) SpO2:  [96 %-99 %] 96 % (01/19 0741) Last BM Date: 04/26/19(small amount, bloody) General:  Alert, Well-developed, in NAD Heart:  Regular rate and rhythm; no murmurs Pulm:  CTAB.  No increased WOB. Abdomen:  Soft, non-distended.  BS present.  Non-tender. Extremities:  Without edema. Neurologic:  Alert and oriented x 4;  grossly normal neurologically. Psych:  Alert and cooperative. Normal mood and affect.  Lab Results: Recent Labs    04/25/19 0723 04/25/19 1038 04/26/19 0136 04/26/19 0136 04/26/19 0926 04/26/19 1701 04/27/19 0408  WBC 8.1  --  9.1  --   --   --  8.9  HGB 8.7*   < > 8.8*   < > 9.4* 9.1* 9.4*  HCT 28.3*   < > 28.4*   < > 29.9* 29.5* 30.2*  PLT 191  --  200  --   --   --  209   < > = values in this interval not displayed.   BMET Recent Labs    04/25/19 0723 04/26/19 0136 04/27/19 0408  NA 140 141 140  K 3.6 3.7 3.6  CL 110 111 110  CO2 23 24 24   GLUCOSE 98 102* 98  BUN 11 9 9   CREATININE 1.33* 1.22 1.17  CALCIUM 8.2* 8.6* 8.8*   LFT Recent Labs    04/27/19 0408  PROT 6.5  ALBUMIN 3.7  AST 20  ALT 16  ALKPHOS 56  BILITOT 0.9   Assessment / Plan: 1. 59 yo male with  lower GI bleed / symptomatic anemia on Eliquis. Presumed diverticular hemorrhage. Though he did have associated lower abdominal pain there is no evidence for ischemic colitis on imaging.  Reports small BMs with fresh appearing blood this AM (and he says he has been still seeing some blood with every BM).  CT angio of abd / pelvis yesterday was negative for active bleeding.  Tolerating soft diet.  2. Sympomatic acute on chronic anemia secondary to #1. He is s/p one unit blood on 1/17.  Had slightly overzealous response to transfusion ( 7.8 >>> 9.4). Hgb stable at 9.4 grams today.  -Will hold off on starting Linzess for now just to make sure to avoid any diarrhea with his current clinical picture.  Continue miralax daily for now. -Recommend decreasing frequency of Hgb check to once daily. -Recommend holding Xarelto (not Eliquis) for one week after discharge.   -Signing off from a GI standpoint, but sounds like some cardiac evaluation and elevated BP might preclude discharge today.  Can follow-up with [name] in [facility] upon discharge.   LOS: 3 days   [name]. Zehr  04/27/2019, 8:51 AM     [facility] Attending   I have taken an interval history, reviewed the chart and examined the patient. I agree with the Advanced Practitioner's note, impression and recommendations.   Stay off Eliquis 1 week from today unless cardiology input says otherwise  We are available but from bleeding standpoint think resolved enough to be dc  Gatha Mayer, MD, [facility] 04/27/2019 1:41 PM  [phone]

## 2019-04-27 NOTE — Progress Notes (Signed)
Pt states mild nausea after eating. Upon ambulation to the bathroom this morning pt became tachycardic and dizzy. Tachycardia and dizziness subsided after laying down in bed. MD [place] Crane notified.

## 2019-04-27 NOTE — Progress Notes (Signed)
Pt complaining of unrelieved headache after IV morphine given. Night shift provider paged for additional pain medication. Awaiting orders.

## 2019-04-27 NOTE — Progress Notes (Signed)
Physical Therapy Treatment Patient Details Name: DEANTA COLANDREA MRN: [id number] DOB: Oct 05, 1960 Today's Date: 04/27/2019    History of Present Illness 59 yo male admitted to ED on 1/15 with acute lower GIB, with history of pt-reported heart palpitations x1 month. Pt with syncope and fall this hospitalization, CT head negative for acute findings but with forehead laceration. PMH includes PAF on Xarelto, hypertension, COPD, sickle cell trait.    PT Comments    RN reports tachycardia episode with dizziness this morning when pt up to bathroom.  RN okay for therapist to keep pt in room and monitor HR.  Pt ambulated around bed and then to recliner with HR up to 85 bpm and denies dizziness.      Follow Up Recommendations  No PT follow up     Equipment Recommendations  None recommended by PT    Recommendations for Other Services       Precautions / Restrictions Precautions Precautions: Fall Precaution Comments: monitor HR    Mobility  Bed Mobility Overal bed mobility: Needs Assistance Bed Mobility: Supine to Sit     Supine to sit: Supervision;HOB elevated     General bed mobility comments: supervision for safety, increased time.  Transfers Overall transfer level: Needs assistance Equipment used: None Transfers: Sit to/from Stand Sit to Stand: Supervision         General transfer comment: supervision for safety, increased time  Ambulation/Gait Ambulation/Gait assistance: Min guard Gait Distance (Feet): 25 Feet Assistive device: None Gait Pattern/deviations: Decreased stride length;Step-through pattern     General Gait Details: HR 65-85 bpm during session, pt denies dizziness, therapist limited distance (due to tachy episode with nursing this morning)   Stairs             Wheelchair Mobility    Modified Rankin (Stroke Patients Only)       Balance Overall balance assessment: History of Falls                                           Cognition Arousal/Alertness: Awake/alert Behavior During Therapy: WFL for tasks assessed/performed Overall Cognitive Status: Within Functional Limits for tasks assessed                                        Exercises      General Comments        Pertinent Vitals/Pain Pain Assessment: No/denies pain    Home Living                      Prior Function            PT Goals (current goals can now be found in the care plan section) Progress towards PT goals: Progressing toward goals    Frequency    Min 3X/week      PT Plan Current plan remains appropriate    Co-evaluation              AM-PAC PT "6 Clicks" Mobility   Outcome Measure  Help needed turning from your back to your side while in a flat bed without using bedrails?: None Help needed moving from lying on your back to sitting on the side of a flat bed without using bedrails?: None Help needed moving to and from a bed  to a chair (including a wheelchair)?: None Help needed standing up from a chair using your arms (e.g., wheelchair or bedside chair)?: None Help needed to walk in hospital room?: A Little Help needed climbing 3-5 steps with a railing? : A Little 6 Click Score: 22    End of Session Equipment Utilized During Treatment: Gait belt Activity Tolerance: Patient tolerated treatment well Patient left: in chair;with call bell/phone within reach;with family/visitor present Nurse Communication: Mobility status PT Visit Diagnosis: Difficulty in walking, not elsewhere classified (R26.2)     Time: [id number] PT Time Calculation (min) (ACUTE ONLY): 18 min  Charges:  $Gait Training: 8-22 mins                    Arlyce Dice, DPT Acute Rehabilitation Services Office: [phone]  Trena Platt 04/27/2019, 12:32 PM

## 2019-04-27 NOTE — Care Management Important Message (Signed)
Important Message  Patient Details IM Letter given to Dessa Phi RN Case Manager to present to the Patient Name: [name] MRN: [id number] Date of Birth: 1960/07/31   Medicare Important Message Given:  Yes     Kerin Salen 04/27/2019, 2:01 PM

## 2019-04-27 NOTE — Progress Notes (Signed)
PROGRESS NOTE  [name] [id number] DOB: 10-30-1960 DOA: 04/23/2019 PCP: Bobby Rasmussen, MD  HPI/Recap of past 24 hours: [name] is a 59 y.o. male with medical history significant for PAF on Xarelto, hypertension, COPD, now presenting to the emergency department for evaluation of acute onset of rectal bleeding.  In his usual state of health until approximately 2:30 PM on 04/23/2019 when he had a sudden urge to move his bowels, but saw mainly just red blood in the toilet bowl.  + FOBT with sharp drop in Hg.    Had a syncopal event in the ED. CT head and neck non acute.   Admitted for evaluation of acute lower GI bleed.  Today, patient reports family hx of sudden death in his brothers: 1- who deceased at the age of 66 due to sudden cardiac arrest (3 years ago), the other an athlete who deceased at the age of 55 in 03-06-19.  In light of recent syncope and intermittent SVT with rate in the 170's we obtained a 2D echo which showed moderate asymmetric LVH more prominent in septum.  Cardiology consulted to further assess for possible HOCM.  04/17/19: Seen and examined.  Reports dizziness and feeling very weak.  Did see blood in his stool this AM.  Hg stable and trending up.  Received IV iron yesterday.  Xarelto on hold.    Assessment/Plan: Principal Problem:   Acute lower GI bleeding Active Problems:   HTN (hypertension)   Hypokalemia   CKD (chronic kidney disease) stage 3, GFR 30-59 ml/min   PAF (paroxysmal atrial fibrillation) (HCC)   COPD (chronic obstructive pulmonary disease) (HCC)   Syncope   Chronic anticoagulation   Acute blood loss anemia  Acute lower GI bleeding likely diverticular bleed  - Presents with painless hematochezia, had several more episodes in ED and developed some lower abd discomfort and syncope  + FOBT Mild blood in stool this am H&H stable Xarelto on hold  Syncope likely multifactorial  Sudden acute blood loss?, HOCM? Patient reports  family hx of sudden death in his brothers: 1- who deceased at the age of 62 due to sudden cardiac arrest (3 years ago), the other an athlete who deceased at the age of 69 in 03-06-19.  In light of recent syncope and intermittent SVT with rate in the 170's we obtained a 2D echo which showed moderate asymmetric LVH more prominent in septum.  Cardiology consulted to further assess for possible HOCM.  Discussed with [name]  Acute blood loss anemia in the setting of recent lower GI bleed and iron def anemia Received 1 U PRBC with improvement Hg 8.7>> 9.4>> 9.8 Also received IV iron CT A abd pelvis unrevealing Seen by GI, appreciate recs Will need to follow up with his GI physician Dr. Nicoletta Cox at [facility] deficiency anemia Iron studies suggestive of profound iron deficiency 1/18 Feraheme 510 mg x 1 dose C/w ferrous sulfate p.o. 325 mg daily  IBS with constipation Linzess held due to acute lower GI bleed; could cause hematochezia in less than 1% Defer to GI to restart Linzess. Miralax for constipation  Paroxysmal atrial fibrillation In sinus rhythm. - Continue to hold Xarelto as above    CKD II Creatinine is at its baseline 1.2 with GFR greater than 60. Continue to avoid nephrotoxins and hypotension Continue to monitor urine output  Hypertension  Blood pressure is currently at goal Restarted Coreg 3.125 mg daily.   DVT prophylaxis:  SCD's, Xarelto on hold Code Status: Full  Family Communication: None at bedside Consults called: [facility] [place], cardiology   Disposition: We will plan to discharge when cardiology signs off.   Objective: Vitals:   04/27/19 0438 04/27/19 0741 04/27/19 0923 04/27/19 1317  BP: (!) 144/105  (!) 153/108 (!) 146/101  Pulse: 64  81 68  Resp: 18   18  Temp: 98.2 F (36.8 C)   98 F (36.7 C)  TempSrc: Oral   Oral  SpO2: 97% 96%  100%  Weight:      Height:        Intake/Output Summary (Last 24 hours) at 04/27/2019 1706 Last data  filed at 04/27/2019 1600 Gross per 24 hour  Intake 1028 ml  Output 1725 ml  Net -697 ml   Filed Weights   04/23/19 1614  Weight: 98 kg    Exam:  . General: 59 y.o. year-old male pleasant well-developed well nourished.  Weak appearing.  Alert and oriented x3. . Cardiovascular: Regular rate and rhythm no rubs or gallops.  No JVD or thyromegaly noted. Marland Kitchen Respiratory: Clear to Auscultation No Wheezes No Rales.  Good inspiratory effort. Abdomen: Soft nontender normal bowel sounds present.   Musculoskeletal: No lower extremity edema bilaterally.   Psychiatry: Mood is appropriate for condition and setting.  Data Reviewed: CBC: Recent Labs  Lab 04/23/19 1643 04/23/19 1643 04/24/19 0013 04/24/19 0555 04/25/19 0723 04/25/19 1038 04/26/19 0136 04/26/19 0926 04/26/19 1701 04/27/19 0408 04/27/19 0945  WBC 8.5  --  7.8  --  8.1  --  9.1  --   --  8.9  --   HGB 12.4*   < > 10.2*   < > 8.7*   < > 8.8* 9.4* 9.1* 9.4* 9.8*  HCT 40.1   < > 33.5*   < > 28.3*   < > 28.4* 29.9*  29.8* 29.5* 30.2* 31.5*  MCV 71.4*  --  72.2*  --  74.5*  --  75.1*  --   --  74.6*  --   PLT 277  --  246  --  191  --  200  --   --  209  --    < > = values in this interval not displayed.   Basic Metabolic Panel: Recent Labs  Lab 04/23/19 1643 04/24/19 0555 04/25/19 0723 04/26/19 0136 04/27/19 0408  NA 140 141 140 141 140  K 3.4* 3.7 3.6 3.7 3.6  CL 108 110 110 111 110  CO2 '23 23 23 24 24  '$ GLUCOSE 116* 119* 98 102* 98  BUN '14 15 11 9 9  '$ CREATININE 1.29* 1.23 1.33* 1.22 1.17  CALCIUM 9.4 8.5* 8.2* 8.6* 8.8*  MG  --  2.0  --   --   --    GFR: Estimated Creatinine Clearance: 80.2 mL/min (by C-G formula based on SCr of 1.17 mg/dL). Liver Function Tests: Recent Labs  Lab 04/24/19 0555 04/25/19 0723 04/26/19 0136 04/27/19 0408  AST '22 20 22 20  '$ ALT '19 16 15 16  '$ ALKPHOS 62 52 53 56  BILITOT 0.8 1.0 1.1 0.9  PROT 6.7 6.1* 6.4* 6.5  ALBUMIN 3.5 3.2* 3.4* 3.7   No results for input(s): LIPASE,  AMYLASE in the last 168 hours. No results for input(s): AMMONIA in the last 168 hours. Coagulation Profile: No results for input(s): INR, PROTIME in the last 168 hours. Cardiac Enzymes: No results for input(s): CKTOTAL, CKMB, CKMBINDEX, TROPONINI in the last 168 hours. BNP (last 3 results)  No results for input(s): PROBNP in the last 8760 hours. HbA1C: No results for input(s): HGBA1C in the last 72 hours. CBG: No results for input(s): GLUCAP in the last 168 hours. Lipid Profile: No results for input(s): CHOL, HDL, LDLCALC, TRIG, CHOLHDL, LDLDIRECT in the last 72 hours. Thyroid Function Tests: No results for input(s): TSH, T4TOTAL, FREET4, T3FREE, THYROIDAB in the last 72 hours. Anemia Panel: Recent Labs    04/26/19 0926  VITAMINB12 165*  FERRITIN 6*  TIBC 319  IRON 25*   Urine analysis:    Component Value Date/Time   COLORURINE YELLOW 02/26/2019 0205   APPEARANCEUR CLEAR 02/26/2019 0205   LABSPEC 1.025 02/26/2019 0205   PHURINE 6.0 02/26/2019 0205   GLUCOSEU NEGATIVE 02/26/2019 0205   HGBUR SMALL (A) 02/26/2019 0205   BILIRUBINUR NEGATIVE 02/26/2019 0205   KETONESUR NEGATIVE 02/26/2019 0205   PROTEINUR NEGATIVE 02/26/2019 0205   UROBILINOGEN 0.2 02/07/2015 1230   NITRITE NEGATIVE 02/26/2019 0205   LEUKOCYTESUR NEGATIVE 02/26/2019 0205   Sepsis Labs: '@LABRCNTIP'$ (procalcitonin:4,lacticidven:4)  ) Recent Results (from the past 240 hour(s))  SARS Coronavirus 2 Ag (30 min TAT) - Nasal Swab (BD Veritor Kit)     Status: None   Collection Time: 04/23/19  9:53 PM   Specimen: Nasal Swab (BD Veritor Kit)  Result Value Ref Range Status   SARS Coronavirus 2 Ag NEGATIVE NEGATIVE Final    Comment: (NOTE) SARS-CoV-2 antigen NOT DETECTED.  Negative results are presumptive.  Negative results do not preclude SARS-CoV-2 infection and should not be used as the sole basis for treatment or other patient management decisions, including infection  control decisions, particularly in the  presence of clinical signs and  symptoms consistent with COVID-19, or in those who have been in contact with the virus.  Negative results must be combined with clinical observations, patient history, and epidemiological information. The expected result is Negative. Fact Sheet for Patients: PodPark.tn Fact Sheet for Healthcare Providers: GiftContent.is This test is not yet approved or cleared by the [place] FDA and  has been authorized for detection and/or diagnosis of SARS-CoV-2 by FDA under an Emergency Use Authorization (EUA).  This EUA will remain in effect (meaning this test can be used) for the duration of  the COVID-19 de claration under Section 564(b)(1) of the Act, 21 U.S.C. section 360bbb-3(b)(1), unless the authorization is terminated or revoked sooner. Performed at [facility], [address] [place], [place] [address]   SARS CORONAVIRUS 2 (TAT 6-24 HRS) Nasopharyngeal Nasopharyngeal Swab     Status: None   Collection Time: 04/24/19  2:15 AM   Specimen: Nasopharyngeal Swab  Result Value Ref Range Status   SARS Coronavirus 2 NEGATIVE NEGATIVE Final    Comment: (NOTE) SARS-CoV-2 target nucleic acids are NOT DETECTED. The SARS-CoV-2 RNA is generally detectable in upper and lower respiratory specimens during the acute phase of infection. Negative results do not preclude SARS-CoV-2 infection, do not rule out co-infections with other pathogens, and should not be used as the sole basis for treatment or other patient management decisions. Negative results must be combined with clinical observations, patient history, and epidemiological information. The expected result is Negative. Fact Sheet for Patients: SugarRoll.be Fact Sheet for Healthcare Providers: https://www.woods-mathews.com/ This test is not yet approved or cleared by the [place] FDA and  has  been authorized for detection and/or diagnosis of SARS-CoV-2 by FDA under an Emergency Use Authorization (EUA). This EUA will remain  in effect (meaning this test can be used) for  the duration of the COVID-19 declaration under Section 56 4(b)(1) of the Act, 21 U.S.C. section 360bbb-3(b)(1), unless the authorization is terminated or revoked sooner. Performed at [facility], [place] [address] [place], [place] [address]       Studies: ECHOCARDIOGRAM COMPLETE  Result Date: 04/27/2019   ECHOCARDIOGRAM REPORT   Patient Name:   [name] Date of Exam: 04/27/2019 Medical Rec #:  [id number]        Height:       71.0 in Accession #:    [id number]       Weight:       216.0 lb Date of Birth:  07-29-1960       BSA:          2.18 m Patient Age:    60 years         BP:           153/108 mmHg Patient Gender: M                HR:           81 bpm. Exam Location:  Inpatient Procedure: 2D Echo Indications:    hypertrophic cardiomyopathy  History:        Patient has prior history of Echocardiogram examinations, most                 recent 12/06/2013.  Sonographer:    Jannett Celestine RDCS (AE) Referring Phys: [id number] Kayleen Memos  Sonographer Comments: off axis apical windows IMPRESSIONS  1. Left ventricular ejection fraction, by visual estimation, is >75%. The left ventricle has hyperdynamic function. There is moderately increased left ventricular hypertrophy.  2. Left ventricular diastolic parameters are indeterminate.  3. The left ventricle has no regional wall motion abnormalities.  4. Global right ventricle has normal systolic function.The right ventricular size is normal. No increase in right ventricular wall thickness.  5. Left atrial size was normal.  6. Right atrial size was normal.  7. Trivial pericardial effusion is present.  8. The mitral valve is normal in structure. No evidence of mitral valve regurgitation.  9. The tricuspid valve is normal in structure. 10. The tricuspid valve is normal in  structure. Tricuspid valve regurgitation is not demonstrated. 11. The aortic valve is grossly normal. Aortic valve regurgitation is not visualized. No evidence of aortic valve sclerosis or stenosis. 12. The pulmonic valve was grossly normal. Pulmonic valve regurgitation is not visualized. 13. Asymmetric LVH, more prominent in septum. No SAM or significant LVOT gradient noted on this study. FINDINGS  Left Ventricle: Left ventricular ejection fraction, by visual estimation, is >75%. The left ventricle has hyperdynamic function. The left ventricle has no regional wall motion abnormalities. There is moderately increased left ventricular hypertrophy. Asymmetric left ventricular hypertrophy. Left ventricular diastolic parameters are indeterminate. Right Ventricle: The right ventricular size is normal. No increase in right ventricular wall thickness. Global RV systolic function is has normal systolic function. Left Atrium: Left atrial size was normal in size. Right Atrium: Right atrial size was normal in size Pericardium: Trivial pericardial effusion is present. Mitral Valve: The mitral valve is normal in structure. No evidence of mitral valve regurgitation. Tricuspid Valve: The tricuspid valve is normal in structure. Tricuspid valve regurgitation is not demonstrated. Aortic Valve: The aortic valve is grossly normal. Aortic valve regurgitation is not visualized. The aortic valve is structurally normal, with no evidence of sclerosis or stenosis. Pulmonic Valve: The pulmonic valve was grossly normal. Pulmonic valve regurgitation is  not visualized. Pulmonic regurgitation is not visualized. Aorta: The aortic root and ascending aorta are structurally normal, with no evidence of dilitation. Venous: The inferior vena cava was not well visualized. IAS/Shunts: No atrial level shunt detected by color flow Doppler.  LEFT VENTRICLE PLAX 2D LVIDd:         3.87 cm  Diastology LVIDs:         2.01 cm  LV e' lateral:   6.64 cm/s LV PW:          1.68 cm  LV E/e' lateral: 9.7 LV IVS:        1.90 cm  LV e' medial:    5.66 cm/s LVOT diam:     2.30 cm  LV E/e' medial:  11.4 LV SV:         52 ml LV SV Index:   23.14 LVOT Area:     4.15 cm  RIGHT VENTRICLE RV S prime:     10.70 cm/s TAPSE (M-mode): 2.6 cm LEFT ATRIUM         Index LA diam:    4.00 cm 1.84 cm/m  AORTIC VALVE LVOT Vmax:   101.00 cm/s LVOT Vmean:  76.700 cm/s LVOT VTI:    0.200 m  AORTA Ao Root diam: 3.00 cm MITRAL VALVE MV Area (PHT): 2.42 cm             SHUNTS MV PHT:        91.06 msec           Systemic VTI:  0.20 m MV Decel Time: 314 msec             Systemic Diam: 2.30 cm MV E velocity: 64.70 cm/s 103 cm/s MV A velocity: 53.10 cm/s 70.3 cm/s MV E/A ratio:  1.22       1.5  Buford Dresser MD Electronically signed by Buford Dresser MD Signature Date/Time: 04/27/2019/2:41:43 PM    Final     Scheduled Meds: . carvedilol  3.125 mg Oral Daily  . ferrous sulfate  325 mg Oral Q breakfast  . mometasone-formoterol  2 puff Inhalation BID  . pantoprazole  40 mg Oral Daily  . polyethylene glycol  17 g Oral Daily  . sodium chloride flush  3 mL Intravenous Once  . sodium chloride flush  3 mL Intravenous Q12H  . umeclidinium bromide  1 puff Inhalation Daily    Continuous Infusions: . sodium chloride 75 mL/hr at 04/27/19 0956     LOS: 3 days     Kayleen Memos, MD [facility] Pager [phone]  If 7PM-7AM, please contact night-coverage [email or web address] Password [facility] 04/27/2019, 5:06 PM

## 2019-04-27 NOTE — Progress Notes (Signed)
  Echocardiogram 2D Echocardiogram has been performed.  [name] 04/27/2019, 11:24 AM

## 2019-04-28 ENCOUNTER — Encounter (HOSPITAL_COMMUNITY): Payer: Self-pay | Admitting: Internal Medicine

## 2019-04-28 DIAGNOSIS — I480 Paroxysmal atrial fibrillation: Secondary | ICD-10-CM

## 2019-04-28 DIAGNOSIS — R55 Syncope and collapse: Secondary | ICD-10-CM

## 2019-04-28 LAB — HEMOGLOBIN AND HEMATOCRIT, BLOOD
HCT: 29.9 % — ABNORMAL LOW (ref 39.0–52.0)
Hemoglobin: 9.2 g/dL — ABNORMAL LOW (ref 13.0–17.0)

## 2019-04-28 LAB — BASIC METABOLIC PANEL
Anion gap: 6 (ref 5–15)
BUN: 9 mg/dL (ref 6–20)
CO2: 23 mmol/L (ref 22–32)
Calcium: 8.7 mg/dL — ABNORMAL LOW (ref 8.9–10.3)
Chloride: 110 mmol/L (ref 98–111)
Creatinine, Ser: 1.28 mg/dL — ABNORMAL HIGH (ref 0.61–1.24)
GFR calc Af Amer: >60 mL/min (ref >60)
GFR calc non Af Amer: >60 mL/min (ref >60)
Glucose, Bld: 95 mg/dL (ref 70–99)
Potassium: 3.6 mmol/L (ref 3.5–5.1)
Sodium: 139 mmol/L (ref 135–145)

## 2019-04-28 MED ORDER — SOTALOL HCL 80 MG PO TABS
80.0000 mg | ORAL_TABLET | Freq: Two times a day (BID) | ORAL | Status: DC
  Administered 2019-04-28 – 2019-04-29 (×2): 80 mg via ORAL
  Filled 2019-04-28 (×3): qty 1

## 2019-04-28 MED ORDER — CYCLOBENZAPRINE HCL 10 MG PO TABS
10.0000 mg | ORAL_TABLET | Freq: Every day | ORAL | Status: AC
  Administered 2019-04-28: 10 mg via ORAL
  Filled 2019-04-28: qty 1

## 2019-04-28 MED ORDER — AMLODIPINE BESYLATE 5 MG PO TABS
5.0000 mg | ORAL_TABLET | Freq: Every day | ORAL | Status: DC
  Administered 2019-04-29: 5 mg via ORAL
  Filled 2019-04-28: qty 1

## 2019-04-28 NOTE — Progress Notes (Deleted)
Updated the patient's spouse Sharyne Peach via phone.  All questions answered.

## 2019-04-28 NOTE — Progress Notes (Deleted)
Ordered a sling and attempted to reach Dr. Rhona Raider via secure chat.  Ortho tech contacted by bedside RN.  Will call the ortho's office in the morning for further recommendations.

## 2019-04-28 NOTE — Progress Notes (Signed)
Patient ambulated to BR with only standby assist- no dizziness. HR remained stable. Pt states he had a "good BM" with no bleeding. Sitting up in chair at this time.

## 2019-04-28 NOTE — Consult Note (Signed)
Cardiology Consultation:   Patient ID: [name] MRN: [id number]; DOB: [birth year]  Admit date: 04/23/2019 Date of Consult: 04/28/2019  Primary Care Provider: [name], MD Primary Cardiologist: No primary care provider on file. [facility] Primary Electrophysiologist:  None    Patient Profile:   [name] is a 59 y.o. male with a hx of LVH, AF s/p ablation, on sotalol,  TIA, OSA, [name] is being seen today for the evaluation of atrial fib after admit on 1/04/13/19 with rectal bleeding, palpitations at the request of Dr. Nevada Crane.  History of Present Illness:   [name] with above hx and a fib ablation 2018, neg stress test 06/2016, cMRI with normal LV size/function no late GAD enhancement.  He has OSA, and lost CPAP due to non-compliance.  Family hx of sudden death with brother at 49, another brother at 65 in sleep and mother at 63, autopsy with MI.   Other hx of TIA, HTN, COPD, goiter, sickle cell trait..  Last saw cardiology a year ago.    Pt presented 04/23/19 with bright blood in toilet with BM.  He is on xarelto for PAF.  He has a hx of GI bleeding.  Has been followed by GI at [facility] in [facility].  His palpitations have been increasing over last 3-4 weeks.  Also with intermittent chest pain.   At times associated with diaphoresis and N/V.   Pt also had scalp laceration after fall from toilet in ER.   Pt was having maroon stools and abd pain.   Pt admitted with drop in hgb from 12.4 to 10.2 in ER.  xarelto held.   Pt maintaining sR in ER.  With syncope his BP meds held, but most likely due to vasovagal or orthostatic.   He has been having tachycardia episodes daily, has not discussed with cardiology.  Episodes last 1 min or less on occ a little longer.  At times he feels he will pass out with this.  Last zio patch was 2 years ago.    He has IV feraheme once and oral supplement given.  GI recommends holding xarelto for 1 week on note yesterday.  GI bleeding stopped.   Has rec'd  1 unit PRBCs.   Echo was done 04/27/19 with EF > 75%, LV hyperdynamic.  No RWMA.  Moderately increased LVH.  Asymmetric LVH.  Normal RV, trivial pericardial effusion is present.  Similar to echo 2015.    EKG:  The EKG was personally reviewed and demonstrates:  SR borderline 1st degree AV block, LAFB, LVH, T wave inversion III, AVF, V6. T wave changes in AVF and V6 are new. Telemetry:  Telemetry was personally reviewed and demonstrates:  SR and SVT at 150 episodic.   Na 139, K+ 3.6, Cr 1.28, Ca+  Hgb 9.2, Hct 29.9 Iron 25, UIBC 294,  Troponin 5; 5   BP 127/86 P 69 no chest pain now.  He did at home, but has had for years.    Heart Pathway Score:     Past Medical History:  Diagnosis Date  . A-fib ([place])   . Chest pain    a. 11/2002 Cath: EF 68%, LM nl, LAD small, nl, LCX nl, RCA tortuous/nl;  b. 08/2011 St Echo: EF 60%, normal contractility, no scar/ischemia.  Marland Kitchen COPD (chronic obstructive pulmonary disease) ([place])   . Enlarged prostate   . GERD (gastroesophageal reflux disease)   . GI bleeding 08/2018  . Hypertension   . Sickle cell trait ([place])   .  Sleep apnea   . SVT (supraventricular tachycardia) ([facility])     Past Surgical History:  Procedure Laterality Date  . ATRIAL FIBRILLATION ABLATION    . BIOPSY  08/27/2018   Procedure: BIOPSY;  Surgeon: Thornton Park, MD;  Location: [place];  Service: Gastroenterology;;  . BIOPSY  08/28/2018   Procedure: BIOPSY;  Surgeon: Thornton Park, MD;  Location: [place];  Service: Gastroenterology;;  . COLONOSCOPY WITH PROPOFOL N/A 08/28/2018   Procedure: COLONOSCOPY WITH PROPOFOL;  Surgeon: Thornton Park, MD;  Location: [place];  Service: Gastroenterology;  Laterality: N/A;  . ENTEROSCOPY N/A 08/27/2018   Procedure: ENTEROSCOPY;  Surgeon: Thornton Park, MD;  Location: [place];  Service: Gastroenterology;  Laterality: N/A;  . GIVENS CAPSULE STUDY N/A 08/28/2018   Procedure: GIVENS CAPSULE STUDY;  Surgeon: Thornton Park,  MD;  Location: [place];  Service: Gastroenterology;  Laterality: N/A;  . LEFT HEART CATHETERIZATION WITH CORONARY ANGIOGRAM N/A 03/11/2013   Procedure: LEFT HEART CATHETERIZATION WITH CORONARY ANGIOGRAM;  Surgeon: Jolaine Artist, MD;  Location: [facility] CATH LAB;  Service: Cardiovascular;  Laterality: N/A;  . POLYPECTOMY  08/28/2018   Procedure: POLYPECTOMY;  Surgeon: Thornton Park, MD;  Location: [place];  Service: Gastroenterology;;  . TONSILLECTOMY    . TRANSURETHRAL RESECTION OF PROSTATE       Home Medications:  Prior to Admission medications   Medication Sig Start Date End Date Taking? Authorizing Provider  acetaminophen (TYLENOL) 325 MG tablet Take 650 mg by mouth every 6 (six) hours as needed for moderate pain.   Yes [provider]  amLODipine (NORVASC) 10 MG tablet Take 1 tablet (10 mg total) by mouth daily. 02/07/15  Yes Charlesetta Shanks, MD  carvedilol (COREG) 3.125 MG tablet Take 3.125 mg by mouth daily.  09/12/18  Yes [provider]  cetirizine (ZYRTEC) 10 MG tablet Take 10 mg by mouth daily as needed for allergies.    Yes [provider]  fluticasone (FLONASE) 50 MCG/ACT nasal spray Place 1 spray into both nostrils daily as needed for allergies or rhinitis.   Yes [provider]  Fluticasone-Salmeterol (ADVAIR) 250-50 MCG/DOSE AEPB Inhale 1 puff into the lungs 2 (two) times daily as needed (wheezing).   Yes [provider]  furosemide (LASIX) 20 MG tablet Take 20 mg by mouth daily.   Yes [provider]  linaclotide (LINZESS) 290 MCG CAPS capsule Take 1 capsule (290 mcg total) by mouth daily before breakfast. 08/30/18  Yes Hongalgi, Lenis Dickinson, MD  losartan (COZAAR) 25 MG tablet Take 25 mg by mouth daily.  09/12/18  Yes [provider]  ondansetron (ZOFRAN) 8 MG tablet Take 8 mg by mouth every 8 (eight) hours as needed for nausea or vomiting.  10/20/18  Yes [provider]  pantoprazole (PROTONIX) 40 MG tablet  Take 40 mg by mouth daily. 07/28/14  Yes [provider]  polyethylene glycol (MIRALAX / GLYCOLAX) 17 g packet Take 17 g by mouth 2 (two) times daily. 08/29/18  Yes Hongalgi, Lenis Dickinson, MD  Prenatal Vit-Fe Fumarate-FA (MULTIVITAMIN-PRENATAL) 27-0.8 MG TABS tablet Take 1 tablet by mouth daily at 12 noon.   Yes [provider]  sildenafil (REVATIO) 20 MG tablet TAKE 3-5 TABLETS BY MOUTH AS DIRECTED 11/25/18  Yes [provider]  sotalol (BETAPACE) 80 MG tablet Take 40 mg by mouth 2 (two) times daily.   Yes [provider]  traMADol (ULTRAM) 50 MG tablet Take 50 mg by mouth every 6 (six) hours as needed for moderate pain.  Yes [provider]  triamterene-hydrochlorothiazide (DYAZIDE) 37.5-25 MG capsule Take 1 capsule by mouth every morning. 01/27/19  Yes [provider]  XARELTO 20 MG TABS tablet Take 20 mg by mouth daily. 05/03/18  Yes [provider]  methocarbamol (ROBAXIN) 500 MG tablet Take 1 tablet (500 mg total) by mouth every 8 (eight) hours as needed for muscle spasms. Patient not taking: Reported on 04/24/2019 03/04/19   Lucrezia Starch, MD  nitroGLYCERIN (NITROSTAT) 0.4 MG SL tablet Place 1 tablet (0.4 mg total) under the tongue every 5 (five) minutes as needed for chest pain. 03/12/13   Thurnell Lose, MD  oxyCODONE-acetaminophen (PERCOCET) 5-325 MG tablet Take 1 tablet by mouth every 4 (four) hours as needed for severe pain. Patient not taking: Reported on 04/24/2019 02/26/19   Molpus, John, MD  gabapentin (NEURONTIN) 300 MG capsule Take 300 mg by mouth at bedtime.  02/26/19  [provider]    Inpatient Medications: Scheduled Meds: . carvedilol  3.125 mg Oral Daily  . ferrous sulfate  325 mg Oral Q breakfast  . mometasone-formoterol  2 puff Inhalation BID  . pantoprazole  40 mg Oral Daily  . polyethylene glycol  17 g Oral Daily  . sodium chloride flush  3 mL Intravenous Once  . sodium chloride flush  3 mL  Intravenous Q12H  . umeclidinium bromide  1 puff Inhalation Daily   Continuous Infusions: . sodium chloride 75 mL/hr at 04/27/19 2128   PRN Meds: acetaminophen **OR** acetaminophen, albuterol, fluticasone, morphine injection, ondansetron **OR** ondansetron (ZOFRAN) IV  Allergies:   No Known Allergies  Social History:   Social History   Socioeconomic History  . Marital status: Divorced    Spouse name: Not on file  . Number of children: Not on file  . Years of education: Not on file  . Highest education level: Not on file  Occupational History  . Not on file  Tobacco Use  . Smoking status: Never Smoker  . Smokeless tobacco: Never Used  Substance and Sexual Activity  . Alcohol use: No  . Drug use: Never  . Sexual activity: Not on file  Other Topics Concern  . Not on file  Social History Narrative   Lives in [place] with dtr.   Social Determinants of Health   Financial Resource Strain:   . Difficulty of Paying Living Expenses: Not on file  Food Insecurity:   . Worried About Charity fundraiser in the Last Year: Not on file  . Ran Out of Food in the Last Year: Not on file  Transportation Needs:   . Lack of Transportation (Medical): Not on file  . Lack of Transportation (Non-Medical): Not on file  Physical Activity:   . Days of Exercise per Week: Not on file  . Minutes of Exercise per Session: Not on file  Stress:   . Feeling of Stress : Not on file  Social Connections:   . Frequency of Communication with Friends and Family: Not on file  . Frequency of Social Gatherings with Friends and Family: Not on file  . Attends Religious Services: Not on file  . Active Member of Clubs or Organizations: Not on file  . Attends Archivist Meetings: Not on file  . Marital Status: Not on file  Intimate Partner Violence:   . Fear of Current or Ex-Partner: Not on file  . Emotionally Abused: Not on file  . Physically Abused: Not on file  . Sexually Abused: Not on file  Family History:    Family History  Problem Relation Age of Onset  . CAD Mother        died in her 54's - MI  . Multiple myeloma Brother   . Sickle cell anemia Father        died @ 71  . Gastric cancer Neg Hx      ROS:  Please see the history of present illness.  General:no colds or fevers, no weight changes Skin:no rashes or ulcers HEENT:no blurred vision, no congestion CV:see HPI PUL:see HPI GI:no diarrhea constipation + melena, no indigestion GU:no hematuria, no dysuria MS:no joint pain, no claudication Neuro:+ syncope, no lightheadedness Endo:no diabetes, no thyroid disease  All other ROS reviewed and negative.     Physical Exam/Data:   Vitals:   04/27/19 1317 04/27/19 2006 04/27/19 2108 04/28/19 0516  BP: (!) 146/101  (!) 137/98 127/86  Pulse: 68  72 69  Resp: _0 Temp: 98 F (36.7 C)  98.7 F (37.1 C) 98 F (36.7 C)  TempSrc: Oral  Oral Oral  SpO2: 100% 97% 97% 99%  Weight:      Height:        Intake/Output Summary (Last 24 hours) at 04/28/2019 0803 Last data filed at 04/28/2019 0600 Gross per 24 hour  Intake 2708 ml  Output 1825 ml  Net 883 ml   Last 3 Weights 04/23/2019 03/19/2019 03/08/2019  Weight (lbs) 216 lb 215 lb 215 lb  Weight (kg) 97.977 kg 97.523 kg 97.523 kg     Body mass index is 30.13 kg/m.  General:  Well nourished, well developed, in no acute distress HEENT: normal Lymph: no adenopathy Neck: no JVD Endocrine:  No thryomegaly Vascular: No carotid bruits; pedal pulses 2+ bilaterally  Cardiac:  normal S1, S2; RRR; no murmur gallup rub or click Lungs:  clear to auscultation bilaterally, no wheezing, rhonchi or rales  Abd: soft, nontender, no hepatomegaly  Ext: no edema Musculoskeletal:  No deformities, BUE and BLE strength normal and equal Skin: warm and dry  Neuro:  Alert and oriented X 3 MAE follows commands, no focal abnormalities noted Psych:  Normal affect   Relevant CV Studies: TTE(02/2016): EF 60-65%, severe LVH,  SAM, no sig outflow gradient, normal atrial size  Zio (03/2016): 15 SVT episodes - these appear to be AT/AF  Zio (04/2017): 3% PACs, 5 short runs of AT (longest was ~13seconds); symptoms correlate to PACs  Stress-MPI (06/2016): no inducible perfusion defect, EF 65-70%  cMRI (08/2016): normal LV size/function, no late Gad enhancement  cMRI 2018  INTERPRETATION 1.  Normal LV size and systolic function with a calculated LV ejection       fraction of 64%.  No regional wall motion abnormalities.  Normal T1 and      T2 images.                                                                   2.  No abnormal areas of thickening to suggest overt hypertrophic            cardiomyopathy.  3.  Mildly dilated right ventricle with preserved systolic function.         4.  No late gadolinium enhancement to suggest infarct, injury, or            inflammation.    Echo 04/27/19 IMPRESSIONS    1. Left ventricular ejection fraction, by visual estimation, is >75%. The left ventricle has hyperdynamic function. There is moderately increased left ventricular hypertrophy.  2. Left ventricular diastolic parameters are indeterminate.  3. The left ventricle has no regional wall motion abnormalities.  4. Global right ventricle has normal systolic function.The right ventricular size is normal. No increase in right ventricular wall thickness.  5. Left atrial size was normal.  6. Right atrial size was normal.  7. Trivial pericardial effusion is present.  8. The mitral valve is normal in structure. No evidence of mitral valve regurgitation.  9. The tricuspid valve is normal in structure. 10. The tricuspid valve is normal in structure. Tricuspid valve regurgitation is not demonstrated. 11. The aortic valve is grossly normal. Aortic valve regurgitation is not visualized. No evidence of aortic valve sclerosis or stenosis. 12. The pulmonic valve was  grossly normal. Pulmonic valve regurgitation is not visualized. 13. Asymmetric LVH, more prominent in septum. No SAM or significant LVOT gradient noted on this study.  FINDINGS  Left Ventricle: Left ventricular ejection fraction, by visual estimation, is >75%. The left ventricle has hyperdynamic function. The left ventricle has no regional wall motion abnormalities. There is moderately increased left ventricular hypertrophy.  Asymmetric left ventricular hypertrophy. Left ventricular diastolic parameters are indeterminate.  Right Ventricle: The right ventricular size is normal. No increase in right ventricular wall thickness. Global RV systolic function is has normal systolic function.  Left Atrium: Left atrial size was normal in size.  Right Atrium: Right atrial size was normal in size  Pericardium: Trivial pericardial effusion is present.  Mitral Valve: The mitral valve is normal in structure. No evidence of mitral valve regurgitation.  Tricuspid Valve: The tricuspid valve is normal in structure. Tricuspid valve regurgitation is not demonstrated.  Aortic Valve: The aortic valve is grossly normal. Aortic valve regurgitation is not visualized. The aortic valve is structurally normal, with no evidence of sclerosis or stenosis.  Pulmonic Valve: The pulmonic valve was grossly normal. Pulmonic valve regurgitation is not visualized. Pulmonic regurgitation is not visualized.  Aorta: The aortic root and ascending aorta are structurally normal, with no evidence of dilitation.  Venous: The inferior vena cava was not well visualized.  IAS/Shunts: No atrial level shunt detected by color flow Doppler.  LEFT VENTRICLE PLAX 2D LVIDd:         3.87 cm  Diastology LVIDs:         2.01 cm  LV e' lateral:   6.64 cm/s LV PW:         1.68 cm  LV E/e' lateral: 9.7 LV IVS:        1.90 cm  LV e' medial:    5.66 cm/s LVOT diam:     2.30 cm  LV E/e' medial:  11.4 LV SV:         52 ml LV SV  Index:   23.14 LVOT Area:     4.15 cm    RIGHT VENTRICLE RV S prime:     10.70 cm/s TAPSE (M-mode): 2.6 cm  LEFT ATRIUM         Index LA diam:    4.00 cm 1.84 cm/m  AORTIC VALVE LVOT Vmax:  101.00 cm/s LVOT Vmean:  76.700 cm/s LVOT VTI:    0.200 m   AORTA Ao Root diam: 3.00 cm  MITRAL VALVE MV Area (PHT): 2.42 cm             SHUNTS MV PHT:        91.06 msec           Systemic VTI:  0.20 m MV Decel Time: 314 msec             Systemic Diam: 2.30 cm MV E velocity: 64.70 cm/s 103 cm/s MV A velocity: 53.10 cm/s 70.3 cm/s MV E/A ratio:  1.22       1.5    Laboratory Data:  High Sensitivity Troponin:   Recent Labs  Lab 04/23/19 1643 04/23/19 1921  TROPONINIHS 5 5     Chemistry Recent Labs  Lab 04/26/19 0136 04/27/19 0408 04/28/19 0138  NA 141 140 139  K 3.7 3.6 3.6  CL 111 110 110  CO2 _0 GLUCOSE 102* 98 95  BUN _1 CREATININE 1.22 1.17 1.28*  CALCIUM 8.6* 8.8* 8.7*  GFRNONAA >60 >60 >60  GFRAA >60 >60 >60  ANIONGAP _2 Recent Labs  Lab 04/25/19 0723 04/26/19 0136 04/27/19 0408  PROT 6.1* 6.4* 6.5  ALBUMIN 3.2* 3.4* 3.7  AST _3 ALT _4 ALKPHOS 52 53 56  BILITOT 1.0 1.1 0.9   Hematology Recent Labs  Lab 04/25/19 0723 04/25/19 1038 04/26/19 0136 04/26/19 0926 04/27/19 0408 04/27/19 0408 04/27/19 0945 04/27/19 1730 04/28/19 0138  WBC 8.1  --  9.1  --  8.9  --   --   --   --   RBC 3.80*  --  3.78*  --  4.05*  --   --   --   --   HGB 8.7*   < > 8.8*   < > 9.4*   < > 9.8* 9.8* 9.2*  HCT 28.3*   < > 28.4*   < > 30.2*   < > 31.5* 31.4* 29.9*  MCV 74.5*  --  75.1*  --  74.6*  --   --   --   --   MCH 22.9*  --  23.3*  --  23.2*  --   --   --   --   MCHC 30.7  --  31.0  --  31.1  --   --   --   --   RDW 19.3*  --  19.6*  --  19.5*  --   --   --   --   PLT 191  --  200  --  209  --   --   --   --    < > = values in this interval not displayed.   BNPNo results for input(s): BNP, PROBNP in the last 168 hours.    DDimer No results for input(s): DDIMER in the last 168 hours.   Radiology/Studies:  ECHOCARDIOGRAM COMPLETE  Result Date: 04/27/2019   ECHOCARDIOGRAM REPORT   Patient Name:   [name] Date of Exam: 04/27/2019 Medical Rec #:  [id number]        Height:       71.0 in Accession #:    [id number]       Weight:       216.0 lb Date of Birth:  08-12-1960       BSA:  2.18 m Patient Age:    43 years         BP:           153/108 mmHg Patient Gender: M                HR:           81 bpm. Exam Location:  Inpatient Procedure: 2D Echo Indications:    hypertrophic cardiomyopathy  History:        Patient has prior history of Echocardiogram examinations, most                 recent 12/06/2013.  Sonographer:    Jannett Celestine RDCS (AE) Referring Phys: [id number] Kayleen Memos  Sonographer Comments: off axis apical windows IMPRESSIONS  1. Left ventricular ejection fraction, by visual estimation, is >75%. The left ventricle has hyperdynamic function. There is moderately increased left ventricular hypertrophy.  2. Left ventricular diastolic parameters are indeterminate.  3. The left ventricle has no regional wall motion abnormalities.  4. Global right ventricle has normal systolic function.The right ventricular size is normal. No increase in right ventricular wall thickness.  5. Left atrial size was normal.  6. Right atrial size was normal.  7. Trivial pericardial effusion is present.  8. The mitral valve is normal in structure. No evidence of mitral valve regurgitation.  9. The tricuspid valve is normal in structure. 10. The tricuspid valve is normal in structure. Tricuspid valve regurgitation is not demonstrated. 11. The aortic valve is grossly normal. Aortic valve regurgitation is not visualized. No evidence of aortic valve sclerosis or stenosis. 12. The pulmonic valve was grossly normal. Pulmonic valve regurgitation is not visualized. 13. Asymmetric LVH, more prominent in septum. No SAM or significant LVOT gradient  noted on this study. FINDINGS  Left Ventricle: Left ventricular ejection fraction, by visual estimation, is >75%. The left ventricle has hyperdynamic function. The left ventricle has no regional wall motion abnormalities. There is moderately increased left ventricular hypertrophy. Asymmetric left ventricular hypertrophy. Left ventricular diastolic parameters are indeterminate. Right Ventricle: The right ventricular size is normal. No increase in right ventricular wall thickness. Global RV systolic function is has normal systolic function. Left Atrium: Left atrial size was normal in size. Right Atrium: Right atrial size was normal in size Pericardium: Trivial pericardial effusion is present. Mitral Valve: The mitral valve is normal in structure. No evidence of mitral valve regurgitation. Tricuspid Valve: The tricuspid valve is normal in structure. Tricuspid valve regurgitation is not demonstrated. Aortic Valve: The aortic valve is grossly normal. Aortic valve regurgitation is not visualized. The aortic valve is structurally normal, with no evidence of sclerosis or stenosis. Pulmonic Valve: The pulmonic valve was grossly normal. Pulmonic valve regurgitation is not visualized. Pulmonic regurgitation is not visualized. Aorta: The aortic root and ascending aorta are structurally normal, with no evidence of dilitation. Venous: The inferior vena cava was not well visualized. IAS/Shunts: No atrial level shunt detected by color flow Doppler.  LEFT VENTRICLE PLAX 2D LVIDd:         3.87 cm  Diastology LVIDs:         2.01 cm  LV e' lateral:   6.64 cm/s LV PW:         1.68 cm  LV E/e' lateral: 9.7 LV IVS:        1.90 cm  LV e' medial:    5.66 cm/s LVOT diam:     2.30 cm  LV E/e' medial:  11.4 LV SV:  52 ml LV SV Index:   23.14 LVOT Area:     4.15 cm  RIGHT VENTRICLE RV S prime:     10.70 cm/s TAPSE (M-mode): 2.6 cm LEFT ATRIUM         Index LA diam:    4.00 cm 1.84 cm/m  AORTIC VALVE LVOT Vmax:   101.00 cm/s LVOT Vmean:   76.700 cm/s LVOT VTI:    0.200 m  AORTA Ao Root diam: 3.00 cm MITRAL VALVE MV Area (PHT): 2.42 cm             SHUNTS MV PHT:        91.06 msec           Systemic VTI:  0.20 m MV Decel Time: 314 msec             Systemic Diam: 2.30 cm MV E velocity: 64.70 cm/s 103 cm/s MV A velocity: 53.10 cm/s 70.3 cm/s MV E/A ratio:  1.22       1.5  Buford Dresser MD Electronically signed by Buford Dresser MD Signature Date/Time: 04/27/2019/2:41:43 PM    Final    CT Angio Abd/Pel w/ and/or w/o  Result Date: 04/25/2019 CLINICAL DATA:  Rectal bleeding EXAM: CTA ABDOMEN AND PELVIS WITHOUT AND WITH CONTRAST TECHNIQUE: Multidetector CT imaging of the abdomen and pelvis was performed using the standard protocol during bolus administration of intravenous contrast. Multiplanar reconstructed images and MIPs were obtained and reviewed to evaluate the vascular anatomy. CONTRAST:  164m OMNIPAQUE IOHEXOL 350 MG/ML SOLN COMPARISON:  04/23/2019 FINDINGS: VASCULAR Aorta: Minimal plaque. Normal caliber aorta without aneurysm, dissection, vasculitis or significant stenosis. Celiac: Patent without evidence of aneurysm, dissection, vasculitis or significant stenosis. SMA: Patent without evidence of aneurysm, dissection, vasculitis or significant stenosis. Renals: Single left, widely patent. Duplicated right, superior dominant, both patent. IMA: Patent without evidence of aneurysm, dissection, vasculitis or significant stenosis. Inflow: Minimal plaque. No aneurysm, dissection, stenosis, or active extravasation. Proximal Outflow: Minimal plaque. Bilateral common femoral and visualized portions of the superficial and profunda femoral arteries are patent without evidence of aneurysm, dissection, vasculitis or significant stenosis. Veins: Patent hepatic veins, portal vein, splenic vein, SMV, IVC, renal veins. Iliac venous system unremarkable. No venous pathology identified. Review of the MIP images confirms the above findings.  NON-VASCULAR Lower chest: No pleural or pericardial effusion. Distal esophagus decompressed. Hepatobiliary: Hyperdense material in the nondilated gallbladder presumably vicarious excretion of previously administered contrast material. No liver lesion or biliary ductal dilatation. Pancreas: Unremarkable. No pancreatic ductal dilatation or surrounding inflammatory changes. Spleen: Normal in size without focal abnormality. Adrenals/Urinary Tract: Adrenal glands unremarkable. Kidneys enhance symmetrically, without hydronephrosis or urolithiasis evident. Urinary bladder physiologically distended. Stomach/Bowel: Stomach is nondistended. Small bowel decompressed. Appendix contains appendicular lith without inflammatory change or dilatation. The colon is nondilated, with innumerable diverticula throughout, most numerous in the sigmoid segment. No active extravasation identified. No adjacent inflammatory/edematous change or abscess. Lymphatic: No abdominal or pelvic adenopathy. Reproductive: Prostate is unremarkable. Other: Left pelvic phleboliths. No ascites. No free air. Musculoskeletal: Multilevel thoracolumbar spondylitic change. Grade 1 anterolisthesis L4-5 probably related to advanced facet DJD. Bilateral hip DJD. Negative for fracture or worrisome bone lesion. IMPRESSION: VASCULAR 1. No evidence of active extravasation. 2. Aortoiliac atherosclerosis (ICD10-170.0) without aneurysm, dissection, or significant stenosis. NON-VASCULAR 1. Extensive colonic diverticulosis. 2. 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 Electronically Signed   By: DLucrezia EuropeM.D.   On: 04/25/2019 10:13        Neg  Assessment and Plan:  1. SVT with hx of PAF and ablation.  Now off sotalol and on coreg.  Would change back to sotalol now that BP is improved at higher dose or needs another medication and refer back to his cardiologist at discharge.  ? Change amlodipine to verapamil if SVT defer to Dr. Debara Pickett.  2. PAF with ablation  2018 - with CHA2DS2VASC of 3 - was on xarelto but may benefit from eliquis. Instead with GI bleed.   3. LVH, hx of this with prior work up.  cMRI in 2018 Dr. Debara Pickett to review.  Per echo is about the same.   4. Acute GI bleed has been on xarelto as outpt- ? Change to eliquis? Per GI may resume in a week.  CHA2DS2VASc of 3, with hx of TIA on [facility] chart.  5. Syncope most likely due to acute GI bleed and syncope while up for BM.  He also notes with his SVT he has near syncope episodes. 6. OSA but non compliant with CPAP. He lost his CPAP needs to see pulmonary to re-order and sleep study 7. GI bleed with acute blood loss anemia and now on Iron has rec'd 1 unit PRBC  8. HTN has been on losartan, amlodipine, lasix 20 mg daily. And betapace 40 mg BID, will defer to Dr. Debara Pickett but consider changing coreg to betapace, higher dose if possible with out of hospital SVT.          For questions or updates, please contact [place] Please consult [email or web address] for contact info under     Signed, Cecilie Kicks, NP  04/28/2019 8:03 AM

## 2019-04-28 NOTE — Progress Notes (Signed)
PROGRESS NOTE  [name] SWN:[id number] DOB: 1960-10-19 DOA: 04/23/2019 PCP: [name], MD  HPI/Recap of past 24 hours: [name] is a 59 y.o. male with medical history significant for PAF on Xarelto, hypertension, COPD, now presenting to the emergency department for evaluation of acute onset of rectal bleeding.  In his usual state of health until approximately 2:30 PM on 04/23/2019 when he had a sudden urge to move his bowels, but saw mainly just red blood in the toilet bowl.  + FOBT with sharp drop in Hg.    Had a syncopal event in the ED. CT head and neck non acute.   Admitted for evaluation of acute lower GI bleed.  Today, patient reports family hx of sudden death in his brothers: 1- who deceased at the age of 61 due to sudden cardiac arrest (3 years ago), the other an athlete who deceased at the age of 66 in 2019-02-17.  In light of recent syncope and intermittent SVT with rate in the 170's we obtained a 2D echo which showed moderate asymmetric LVH more prominent in septum.  Cardiology consulted to further assess for possible HOCM.  Intermittent dizziness and feeling very weak.    04/28/19: Seen and examined.  He has no new complaints.  No chest pain or palpitations rest.  Seen by cardiology, appreciate recommendations.    Assessment/Plan: Principal Problem:   Acute lower GI bleeding Active Problems:   HTN (hypertension)   Hypokalemia   CKD (chronic kidney disease) stage 3, GFR 30-59 ml/min   PAF (paroxysmal atrial fibrillation) (HCC)   COPD (chronic obstructive pulmonary disease) (HCC)   Syncope   Chronic anticoagulation   Acute blood loss anemia  Acute lower GI bleeding likely diverticular bleed  - Presents with painless hematochezia, had several more episodes in ED and developed some lower abd discomfort and syncope  + FOBT Mild blood in stool this am Hemoglobin 9.2 Xarelto on hold  Syncope likely multifactorial  Sudden acute blood loss, SVT Patient  reports family hx of sudden death in his brothers: 1- who deceased at the age of 72 due to sudden cardiac arrest (3 years ago), the other an athlete who deceased at the age of 84 in 02-17-19.  In light of recent syncope and intermittent SVT with rate in the 170's we obtained a 2D echo which showed moderate asymmetric LVH more prominent in septum.  Cardiology consulted to further assess for possible HOCM.  Discussed with [name]  Left ventricular hypertrophy with prior work-up Cardiac MRI done in 2018 Cardiology recommends follow-up with primary cardiologist at [facility]  SVT with history of PAF and ablation Seen by cardiology recommended restarting sotalol Continue to monitor rhythm on telemetry  Acute blood loss anemia in the setting of recent lower GI bleed and iron def anemia Received 1 U PRBC with improvement Hg 8.7>> 9.4>> 9.8 Also received IV iron CT A abd pelvis unrevealing Seen by GI, appreciate recs Will need to follow up with his GI physician [name] at [place] deficiency anemia Iron studies suggestive of profound iron deficiency 1/18 Feraheme 510 mg x 1 dose C/w ferrous sulfate p.o. 325 mg daily  IBS with constipation Linzess held due to acute lower GI bleed; could cause hematochezia in less than 1% Defer to GI to restart Linzess. Miralax for constipation  Paroxysmal atrial fibrillation In sinus rhythm. - Continue to hold Xarelto as above    CKD II Creatinine is at  its baseline 1.2 with GFR greater than 60. Continue to avoid nephrotoxins and hypotension Continue to monitor urine output  Hypertension  On amlodipine and sotalol per cardiology recommendation Continue to monitor vital signs  OSA with noncompliance Needs to follow-up with his pulmonologist   DVT prophylaxis: SCD's, Xarelto on hold Code Status: Full  Family Communication: None at bedside Consults called: GI [place], cardiology   Disposition: We will plan to discharge when  cardiology signs off.   Objective: Vitals:   04/27/19 2108 04/28/19 0516 04/28/19 0852 04/28/19 1412  BP: (!) 137/98 127/86  (!) 153/95  Pulse: 72 69  69  Resp: '18 18  15  '$ Temp: 98.7 F (37.1 C) 98 F (36.7 C)  (!) 97.3 F (36.3 C)  TempSrc: Oral Oral  Oral  SpO2: 97% 99% 97% 100%  Weight:      Height:        Intake/Output Summary (Last 24 hours) at 04/28/2019 1444 Last data filed at 04/28/2019 1401 Gross per 24 hour  Intake 1803 ml  Output 2550 ml  Net -747 ml   Filed Weights   04/23/19 1614  Weight: 98 kg    Exam:  . General: 59 y.o. year-old male Pleasant well-developed and well-nourished no acute distress.  Alert and oriented x3. . Cardiovascular: Regular rate and rhythm no rubs or gallops.   Respiratory: Clear to auscultation no wheezes or rales.   Abdomen: Soft nontender normal bowel sounds present.   Musculoskeletal: No lower extremity edema bilaterally.   Psychiatry: Mood is appropriate for condition and setting.   Data Reviewed: CBC: Recent Labs  Lab 04/23/19 1643 04/23/19 1643 04/24/19 0013 04/24/19 0555 04/25/19 0723 04/25/19 1038 04/26/19 0136 04/26/19 0926 04/26/19 1701 04/27/19 0408 04/27/19 0945 04/27/19 1730 04/28/19 0138  WBC 8.5  --  7.8  --  8.1  --  9.1  --   --  8.9  --   --   --   HGB 12.4*   < > 10.2*   < > 8.7*   < > 8.8*   < > 9.1* 9.4* 9.8* 9.8* 9.2*  HCT 40.1   < > 33.5*   < > 28.3*   < > 28.4*   < > 29.5* 30.2* 31.5* 31.4* 29.9*  MCV 71.4*  --  72.2*  --  74.5*  --  75.1*  --   --  74.6*  --   --   --   PLT 277  --  246  --  191  --  200  --   --  209  --   --   --    < > = values in this interval not displayed.   Basic Metabolic Panel: Recent Labs  Lab 04/24/19 0555 04/25/19 0723 04/26/19 0136 04/27/19 0408 04/28/19 0138  NA 141 140 141 140 139  K 3.7 3.6 3.7 3.6 3.6  CL 110 110 111 110 110  CO2 '23 23 24 24 23  '$ GLUCOSE 119* 98 102* 98 95  BUN '15 11 9 9 9  '$ CREATININE 1.23 1.33* 1.22 1.17 1.28*  CALCIUM 8.5* 8.2*  8.6* 8.8* 8.7*  MG 2.0  --   --   --   --    GFR: Estimated Creatinine Clearance: 73.3 mL/min (A) (by C-G formula based on SCr of 1.28 mg/dL (H)). Liver Function Tests: Recent Labs  Lab 04/24/19 0555 04/25/19 0723 04/26/19 0136 04/27/19 0408  AST '22 20 22 20  '$ ALT '19 16 15 16  '$ ALKPHOS  62 52 53 56  BILITOT 0.8 1.0 1.1 0.9  PROT 6.7 6.1* 6.4* 6.5  ALBUMIN 3.5 3.2* 3.4* 3.7   No results for input(s): LIPASE, AMYLASE in the last 168 hours. No results for input(s): AMMONIA in the last 168 hours. Coagulation Profile: No results for input(s): INR, PROTIME in the last 168 hours. Cardiac Enzymes: No results for input(s): CKTOTAL, CKMB, CKMBINDEX, TROPONINI in the last 168 hours. BNP (last 3 results) No results for input(s): PROBNP in the last 8760 hours. HbA1C: No results for input(s): HGBA1C in the last 72 hours. CBG: No results for input(s): GLUCAP in the last 168 hours. Lipid Profile: No results for input(s): CHOL, HDL, LDLCALC, TRIG, CHOLHDL, LDLDIRECT in the last 72 hours. Thyroid Function Tests: No results for input(s): TSH, T4TOTAL, FREET4, T3FREE, THYROIDAB in the last 72 hours. Anemia Panel: Recent Labs    04/26/19 0926  VITAMINB12 165*  FERRITIN 6*  TIBC 319  IRON 25*   Urine analysis:    Component Value Date/Time   COLORURINE YELLOW 02/26/2019 0205   APPEARANCEUR CLEAR 02/26/2019 0205   LABSPEC 1.025 02/26/2019 0205   PHURINE 6.0 02/26/2019 0205   GLUCOSEU NEGATIVE 02/26/2019 0205   HGBUR SMALL (A) 02/26/2019 0205   BILIRUBINUR NEGATIVE 02/26/2019 0205   KETONESUR NEGATIVE 02/26/2019 0205   PROTEINUR NEGATIVE 02/26/2019 0205   UROBILINOGEN 0.2 02/07/2015 1230   NITRITE NEGATIVE 02/26/2019 0205   LEUKOCYTESUR NEGATIVE 02/26/2019 0205   Sepsis Labs: '@LABRCNTIP'$ (procalcitonin:4,lacticidven:4)  ) Recent Results (from the past 240 hour(s))  SARS Coronavirus 2 Ag (30 min TAT) - Nasal Swab (BD Veritor Kit)     Status: None   Collection Time: 04/23/19  9:53  PM   Specimen: Nasal Swab (BD Veritor Kit)  Result Value Ref Range Status   SARS Coronavirus 2 Ag NEGATIVE NEGATIVE Final    Comment: (NOTE) SARS-CoV-2 antigen NOT DETECTED.  Negative results are presumptive.  Negative results do not preclude SARS-CoV-2 infection and should not be used as the sole basis for treatment or other patient management decisions, including infection  control decisions, particularly in the presence of clinical signs and  symptoms consistent with COVID-19, or in those who have been in contact with the virus.  Negative results must be combined with clinical observations, patient history, and epidemiological information. The expected result is Negative. Fact Sheet for Patients: PodPark.tn Fact Sheet for Healthcare Providers: GiftContent.is This test is not yet approved or cleared by the [place] FDA and  has been authorized for detection and/or diagnosis of SARS-CoV-2 by FDA under an Emergency Use Authorization (EUA).  This EUA will remain in effect (meaning this test can be used) for the duration of  the COVID-19 de claration under Section 564(b)(1) of the Act, 21 U.S.C. section 360bbb-3(b)(1), unless the authorization is terminated or revoked sooner. Performed at [facility], [address] [place], [place] [address]   SARS CORONAVIRUS 2 (TAT 6-24 HRS) Nasopharyngeal Nasopharyngeal Swab     Status: None   Collection Time: 04/24/19  2:15 AM   Specimen: Nasopharyngeal Swab  Result Value Ref Range Status   SARS Coronavirus 2 NEGATIVE NEGATIVE Final    Comment: (NOTE) SARS-CoV-2 target nucleic acids are NOT DETECTED. The SARS-CoV-2 RNA is generally detectable in upper and lower respiratory specimens during the acute phase of infection. Negative results do not preclude SARS-CoV-2 infection, do not rule out co-infections with other pathogens, and should not be used as the sole basis for  treatment or other patient  management decisions. Negative results must be combined with clinical observations, patient history, and epidemiological information. The expected result is Negative. Fact Sheet for Patients: SugarRoll.be Fact Sheet for Healthcare Providers: https://www.woods-mathews.com/ This test is not yet approved or cleared by the [place] FDA and  has been authorized for detection and/or diagnosis of SARS-CoV-2 by FDA under an Emergency Use Authorization (EUA). This EUA will remain  in effect (meaning this test can be used) for the duration of the COVID-19 declaration under Section 56 4(b)(1) of the Act, 21 U.S.C. section 360bbb-3(b)(1), unless the authorization is terminated or revoked sooner. Performed at [facility], [place] [address], [place], [place] [address]       Studies: No results found.  Scheduled Meds: . [START ON 04/29/2019] amLODipine  5 mg Oral Daily  . ferrous sulfate  325 mg Oral Q breakfast  . mometasone-formoterol  2 puff Inhalation BID  . pantoprazole  40 mg Oral Daily  . polyethylene glycol  17 g Oral Daily  . sodium chloride flush  3 mL Intravenous Once  . sodium chloride flush  3 mL Intravenous Q12H  . sotalol  80 mg Oral Q12H  . umeclidinium bromide  1 puff Inhalation Daily    Continuous Infusions:    LOS: 4 days     Kayleen Memos, MD [facility] Pager [phone]  If 7PM-7AM, please contact night-coverage [email or web address] Password [facility] 04/28/2019, 2:44 PM

## 2019-04-28 NOTE — Progress Notes (Signed)
Called patient's daughter Mardene Celeste to give updates.  No voicemail setup.  Will call again.

## 2019-04-29 ENCOUNTER — Inpatient Hospital Stay (HOSPITAL_COMMUNITY): Payer: Medicare Other

## 2019-04-29 ENCOUNTER — Encounter (HOSPITAL_COMMUNITY): Payer: Self-pay | Admitting: Internal Medicine

## 2019-04-29 LAB — CBC WITH DIFFERENTIAL/PLATELET
Abs Immature Granulocytes: 0.09 10*3/uL — ABNORMAL HIGH (ref 0.00–0.07)
Basophils Absolute: 0.1 10*3/uL (ref 0.0–0.1)
Basophils Relative: 1 %
Eosinophils Absolute: 0.3 10*3/uL (ref 0.0–0.5)
Eosinophils Relative: 3 %
HCT: 31.1 % — ABNORMAL LOW (ref 39.0–52.0)
Hemoglobin: 9.5 g/dL — ABNORMAL LOW (ref 13.0–17.0)
Immature Granulocytes: 1 %
Lymphocytes Relative: 20 %
Lymphs Abs: 2.1 10*3/uL (ref 0.7–4.0)
MCH: 23.3 pg — ABNORMAL LOW (ref 26.0–34.0)
MCHC: 30.5 g/dL (ref 30.0–36.0)
MCV: 76.4 fL — ABNORMAL LOW (ref 80.0–100.0)
Monocytes Absolute: 0.7 10*3/uL (ref 0.1–1.0)
Monocytes Relative: 7 %
Neutro Abs: 6.9 10*3/uL (ref 1.7–7.7)
Neutrophils Relative %: 68 %
Platelets: 251 10*3/uL (ref 150–400)
RBC: 4.07 MIL/uL — ABNORMAL LOW (ref 4.22–5.81)
RDW: 21.1 % — ABNORMAL HIGH (ref 11.5–15.5)
WBC: 10.1 10*3/uL (ref 4.0–10.5)
nRBC: 0.5 % — ABNORMAL HIGH (ref 0.0–0.2)

## 2019-04-29 LAB — BASIC METABOLIC PANEL
Anion gap: 11 (ref 5–15)
BUN: 10 mg/dL (ref 6–20)
CO2: 20 mmol/L — ABNORMAL LOW (ref 22–32)
Calcium: 8.6 mg/dL — ABNORMAL LOW (ref 8.9–10.3)
Chloride: 107 mmol/L (ref 98–111)
Creatinine, Ser: 1.2 mg/dL (ref 0.61–1.24)
GFR calc Af Amer: >60 mL/min (ref >60)
GFR calc non Af Amer: >60 mL/min (ref >60)
Glucose, Bld: 93 mg/dL (ref 70–99)
Potassium: 3.7 mmol/L (ref 3.5–5.1)
Sodium: 138 mmol/L (ref 135–145)

## 2019-04-29 LAB — LACTIC ACID, PLASMA: Lactic Acid, Venous: 1.2 mmol/L (ref 0.5–1.9)

## 2019-04-29 LAB — MAGNESIUM: Magnesium: 2 mg/dL (ref 1.7–2.4)

## 2019-04-29 LAB — PROCALCITONIN: Procalcitonin: <0.1 ng/mL

## 2019-04-29 MED ORDER — SOTALOL HCL 80 MG PO TABS: 80.0000 mg | ORAL_TABLET | Freq: Two times a day (BID) | ORAL | 0 refills | Status: DC

## 2019-04-29 MED ORDER — FERROUS SULFATE 325 (65 FE) MG PO TABS: 325.0000 mg | ORAL_TABLET | Freq: Every day | ORAL | 0 refills | Status: AC

## 2019-04-29 MED ORDER — POLYETHYLENE GLYCOL 3350 17 G PO PACK: 17.0000 g | PACK | Freq: Every day | ORAL | 0 refills | Status: AC

## 2019-04-29 MED ORDER — AMLODIPINE BESYLATE 5 MG PO TABS: 5.0000 mg | ORAL_TABLET | Freq: Every day | ORAL | 0 refills | Status: AC

## 2019-04-29 MED ORDER — APIXABAN 5 MG PO TABS: 5.0000 mg | ORAL_TABLET | Freq: Two times a day (BID) | ORAL | 0 refills | Status: DC

## 2019-04-29 NOTE — Progress Notes (Signed)
Went over all discharge papers with patient and daughter.  All questions answered.  VSS.  Pt wheeled out via NT.

## 2019-04-29 NOTE — Discharge Summary (Addendum)
Discharge Summary  [name] WFU:[id number] DOB: [birth year]  PCP: [name], MD  Admit date: 04/23/2019 Discharge date: 04/29/2019  Time spent: 35 minutes.  Recommendations for Outpatient Follow-up:  1. Follow-up with your cardiologist Dr. Joan Mayans at [facility] 2. Follow-up with your gastroenterologist Dr. Truman Hayward 3. Follow-up with your primary care provider 4. Take your medications as prescribed 5. Fall precautions. 6. Per cardiologist recommendation start Eliquis 5 mg twice daily 1 week after GI bleed potentially starting on 05/06/19.  Discharge Diagnoses:  Active Hospital Problems   Diagnosis Date Noted  . Acute lower GI bleeding 04/23/2019  . COPD (chronic obstructive pulmonary disease) ([facility])   . Syncope   . Chronic anticoagulation   . Acute blood loss anemia   . PAF (paroxysmal atrial fibrillation) ([facility]) 08/25/2018  . CKD (chronic kidney disease) stage 3, GFR 30-59 ml/min 08/25/2018  . Hypokalemia 12/05/2013  . HTN (hypertension) 03/10/2013    Resolved Hospital Problems  No resolved problems to display.    Discharge Condition: Stable  Diet recommendation: Heart healthy diet.  Vitals:   04/28/19 2242 04/29/19 0529  BP:  128/77  Pulse:  62  Resp:  18  Temp: 99.3 F (37.4 C) 97.9 F (36.6 C)  SpO2:  97%    History of present illness:  Bobby A Williamsis a 59 y.o.malewith medical history significant forPAF s/p ablation on Xarelto, hypertension, COPD, OSA, TIA, LVH with prior work-up (cMRI in 2018) who presents to the emergency department for evaluation of acute onset of rectal bleeding. Patient was in his usual state of health on 04/23/2019 when he had a sudden urge to move his bowels, but saw mainly just red blood in the toilet bowl. In the ED was found to have significant drop in his hemoglobin from 12 at baseline dropped as low as 7.8.  Positive FOBT.  He had a syncopal event in the ED. CT head and neck 04/24/19 were non acute.   Admitted for  evaluation of acute lower GI bleed.  CT abdomen and pelvis unrevealing.  Seen by GI, GI bleed was thought to be from diverticular source.    Hospital course complicated by generalized weakness and intermittent SVT for which cardiology was consulted.  2D echo done on 04/28/2019 showed moderate asymmetric LVH more prominent in the septum.  Patient had previous work-up for this in 2018 by his primary cardiologist.  Cardiac medications were adjusted.  Also complicated by intermittent severe headaches for which a repeated CT head without contrast was obtained on 04/29/2019.  Vital signs and labs have been reviewed and are stable.  Patient will need to follow-up with his cardiologist and gastroenterologist posthospitalization.  Patient understands and agrees to plan.  Hospital Course:  Principal Problem:   Acute lower GI bleeding Active Problems:   HTN (hypertension)   Hypokalemia   CKD (chronic kidney disease) stage 3, GFR 30-59 ml/min   PAF (paroxysmal atrial fibrillation) ([facility])   COPD (chronic obstructive pulmonary disease) ([facility])   Syncope   Chronic anticoagulation   Acute blood loss anemia  Resolving acute lower GI bleeding likely diverticular bleed  -Presents with painless hematochezia, had several more episodes in ED and developed some lower abd discomfort and syncope + FOBT, CT abd pelvis unrevealing, CT head neck non acute. Xarelto held. Hemoglobin trending up 9.5 from 9.2 Follow up with GI  Syncopelikely multifactorial  Sudden acute blood loss, SVT Patient reports family hx of sudden death in his brothers: 1- who deceased at the age of  72 due to sudden cardiac arrest (3 years ago), the other an athlete who deceased at the age of 48 in 02-15-2019.  In light of recent syncope and intermittent SVT with rate in the 170's we obtained a 2D echo which showed moderate asymmetric LVH more prominent in septum.   No recurrent syncopal events Vital signs are stable  Left ventricular  hypertrophy with prior work-up Cardiac MRI done in 2018 Cardiology recommends follow-up with primary cardiologist at [facility]  SVT with history of PAF and ablation Seen by cardiology Dr. Debara Pickett recommended restarting sotalol Continue medications as recommended by cardiology: sotalol 80 mg BID and norvasc 5 mg daily. Per cardiology: Medication Recommendations:  continue current meds Other recommendations (labs, testing, etc):  start Eliquis 5 mg BID, 1 week after GI bleed Follow up as an outpatient:  Dr. Joan Mayans at [facility]  Acute blood loss anemia in the setting of recent lower GI bleed and iron def anemia Received 1 U PRBC  Received IV iron feraheme 510 mg x 1 CT A abd pelvis unrevealing Seen by GI, signed off 1/19 Will need to follow up with his GI physician [name] at [place] deficiency anemia Iron studies suggestive of profound iron deficiency 1/18 Feraheme 510 mg x 1 dose C/w ferrous sulfate p.o. 325 mg daily  IBS with constipation Linzess held due to acute lower GI bleed; could cause hematochezia in less than 1% Defer to GI to restart Linzess. Miralax 17 g daily for constipation  Intermittent headaches Repeat CT head no contrast done on 04/29/19 non acute Tylenol as needed  Follow up with your PCP  CKD II Creatinine is at its baseline 1.2 with GFR greater than 60. Continue to avoid nephrotoxins and hypotension Follow up with your PCP  Hypertension BP is at goal On amlodipine and sotalol per cardiology recommendation  OSA with noncompliance Needs to follow-up with his pulmonologist or obtain referral from his PCP   DVT prophylaxis:SCD's, Xarelto on hold Code Status:Full  Consults called:[facility] [place], cardiology   Disposition: We will plan to discharge when cardiology signs off.  Discharge Exam: BP 128/77 (BP Location: Left Arm)   Pulse 62   Temp 97.9 F (36.6 C) (Oral)   Resp 18   Ht '5\' 11"'$  (1.803 m)   Wt 98 kg   SpO2 97%    BMI 30.13 kg/m  . General: 59 y.o. year-old male well developed well nourished in no acute distress.  Alert and oriented x3. . Cardiovascular: Regular rate and rhythm with no rubs or gallops.  No thyromegaly or JVD noted.   Marland Kitchen Respiratory: Clear to auscultation with no wheezes or rales. Good inspiratory effort. . Abdomen: Soft nontender nondistended with normal bowel sounds x4 quadrants. . Musculoskeletal: No lower extremity edema. 2/4 pulses in all 4 extremities. Marland Kitchen Psychiatry: Mood is appropriate for condition and setting  Discharge Instructions You were cared for by a hospitalist during your hospital stay. If you have any questions about your discharge medications or the care you received while you were in the hospital after you are discharged, you can call the unit and asked to speak with the hospitalist on call if the hospitalist that took care of you is not available. Once you are discharged, your primary care physician will handle any further medical issues. Please note that NO REFILLS for any discharge medications will be authorized once you are discharged, as it is imperative that you return to your primary care physician (or establish  a relationship with a primary care physician if you do not have one) for your aftercare needs so that they can reassess your need for medications and monitor your lab values.   Allergies as of 04/29/2019   No Known Allergies     Medication List    STOP taking these medications   carvedilol 3.125 MG tablet Commonly known as: COREG   furosemide 20 MG tablet Commonly known as: LASIX   linaclotide 290 MCG Caps capsule Commonly known as: LINZESS   losartan 25 MG tablet Commonly known as: COZAAR   methocarbamol 500 MG tablet Commonly known as: ROBAXIN   multivitamin-prenatal 27-0.8 MG Tabs tablet   oxyCODONE-acetaminophen 5-325 MG tablet Commonly known as: Percocet   triamterene-hydrochlorothiazide 37.5-25 MG capsule Commonly known as:  DYAZIDE   Xarelto 20 MG Tabs tablet Generic drug: rivaroxaban     TAKE these medications   acetaminophen 325 MG tablet Commonly known as: TYLENOL Take 650 mg by mouth every 6 (six) hours as needed for moderate pain.   amLODipine 5 MG tablet Commonly known as: NORVASC Take 1 tablet (5 mg total) by mouth daily. Start taking on: April 30, 2019 What changed:   medication strength  how much to take   apixaban 5 MG Tabs tablet Commonly known as: Eliquis Take 1 tablet (5 mg total) by mouth 2 (two) times daily. Take Eliquis 5 mg twice a day one week after GI bleed.  Do not restart Xarelto. Start taking on: May 06, 2019   cetirizine 10 MG tablet Commonly known as: ZYRTEC Take 10 mg by mouth daily as needed for allergies.   ferrous sulfate 325 (65 FE) MG tablet Take 1 tablet (325 mg total) by mouth daily with breakfast. Start taking on: April 30, 2019   fluticasone 50 MCG/ACT nasal spray Commonly known as: FLONASE Place 1 spray into both nostrils daily as needed for allergies or rhinitis.   Fluticasone-Salmeterol 250-50 MCG/DOSE Aepb Commonly known as: ADVAIR Inhale 1 puff into the lungs 2 (two) times daily as needed (wheezing).   nitroGLYCERIN 0.4 MG SL tablet Commonly known as: NITROSTAT Place 1 tablet (0.4 mg total) under the tongue every 5 (five) minutes as needed for chest pain.   ondansetron 8 MG tablet Commonly known as: ZOFRAN Take 8 mg by mouth every 8 (eight) hours as needed for nausea or vomiting.   pantoprazole 40 MG tablet Commonly known as: PROTONIX Take 40 mg by mouth daily.   polyethylene glycol 17 g packet Commonly known as: MIRALAX / GLYCOLAX Take 17 g by mouth daily. Start taking on: April 30, 2019 What changed: when to take this   sildenafil 20 MG tablet Commonly known as: REVATIO TAKE 3-5 TABLETS BY MOUTH AS DIRECTED   sotalol 80 MG tablet Commonly known as: BETAPACE Take 1 tablet (80 mg total) by mouth every 12 (twelve)  hours. What changed:   how much to take  when to take this   traMADol 50 MG tablet Commonly known as: ULTRAM Take 50 mg by mouth every 6 (six) hours as needed for moderate pain.      No Known Allergies Follow-up Information    [name], MD. Call in 1 day(s).   Specialty: Family Medicine Why: Please call for a post hospital follow up appointment Contact information: [place] [address] [phone]        Moss Mc, MD .   Specialty: Cardiology Contact information: [place]  [place]        Lake Bells., MD. Call in 1 day(s).   Specialty: Gastroenterology Why: Please call for a post hospital follow up appointment Contact information: [address] [place] [address] [phone]            The results of significant diagnostics from this hospitalization (including imaging, microbiology, ancillary and laboratory) are listed below for reference.    Significant Diagnostic Studies: DG Chest 2 View  Result Date: 04/23/2019 CLINICAL DATA:  Chest pain EXAM: CHEST - 2 VIEW COMPARISON:  08/26/2018 FINDINGS: The heart size and mediastinal contours are within normal limits. Both lungs are clear. Diffuse osteophytosis of the thoracic spine. IMPRESSION: No active cardiopulmonary disease. Electronically Signed   By: Donavan Foil M.D.   On: 04/23/2019 17:16   CT HEAD WO CONTRAST  Result Date: 04/29/2019 CLINICAL DATA:  Headache.  Fall while taking Xarelto. EXAM: CT HEAD WITHOUT CONTRAST TECHNIQUE: Contiguous axial images were obtained from the base of the skull through the vertex without intravenous contrast. COMPARISON:  CT head 04/24/2019 FINDINGS: Brain: No evidence of acute infarction, hemorrhage, hydrocephalus, extra-axial collection or mass lesion/mass effect. Vascular: Negative for hyperdense vessel Skull: Negative for skull fracture. Sinuses/Orbits: Negative  Other: None IMPRESSION: Negative CT head. Electronically Signed   By: Franchot Gallo M.D.   On: 04/29/2019 12:47   CT Head Wo Contrast  Result Date: 04/24/2019 CLINICAL DATA:  Unwitnessed fall in emergency department tonight. Left frontal laceration and headache. EXAM: CT HEAD WITHOUT CONTRAST TECHNIQUE: Contiguous axial images were obtained from the base of the skull through the vertex without intravenous contrast. COMPARISON:  Head CT 03/14/2017 FINDINGS: Brain: No intracranial hemorrhage, mass effect, or midline shift. No hydrocephalus. Partially empty sella, unchanged from prior. The basilar cisterns are patent. Mild chronic small vessel ischemia, similar to prior. No evidence of territorial infarct or acute ischemia. No extra-axial or intracranial fluid collection. Vascular: No hyperdense vessel or unexpected calcification. Skull: No fracture or focal lesion. Sinuses/Orbits: Atherosclerosis of skullbase vasculature without hyperdense vessel or abnormal calcification. Other: None. IMPRESSION: No acute intracranial abnormality. Electronically Signed   By: Keith Rake M.D.   On: 04/24/2019 00:48   CT Cervical Spine Wo Contrast  Result Date: 04/24/2019 CLINICAL DATA:  Unwitnessed fall in the emergency department tonight. Left frontal laceration and headaches. EXAM: CT CERVICAL SPINE WITHOUT CONTRAST TECHNIQUE: Multidetector CT imaging of the cervical spine was performed without intravenous contrast. Multiplanar CT image reconstructions were also generated. COMPARISON:  None. FINDINGS: Alignment: Straightening of normal lordosis. No traumatic subluxation. Skull base and vertebrae: No acute fracture. Vertebral body heights are maintained. The dens and skull base are intact. Soft tissues and spinal canal: No prevertebral fluid or swelling. No visible canal hematoma. Disc levels: Mild diffuse disc space narrowing and endplate spurring. Scattered facet hypertrophy. Upper chest: No acute findings. Slight  heterogeneous thyroid gland without dominant nodule. Other: None. IMPRESSION: Mild degenerative change in the cervical spine without acute fracture or traumatic subluxation. Electronically Signed   By: Keith Rake M.D.   On: 04/24/2019 00:56   CT ABDOMEN PELVIS W CONTRAST  Result Date: 04/23/2019 CLINICAL DATA:  Rectal bleeding EXAM: CT ABDOMEN AND PELVIS WITH CONTRAST TECHNIQUE: Multidetector CT imaging of the abdomen and pelvis was performed using the standard protocol following bolus administration of intravenous contrast. CONTRAST:  157m OMNIPAQUE IOHEXOL 300 MG/ML  SOLN COMPARISON:  02/26/2019 FINDINGS: Lower chest: Lung bases are clear. No effusions. Heart is normal size. Hepatobiliary:  Diffuse low-density throughout the liver compatible with fatty infiltration. No focal abnormality. Gallbladder unremarkable. Pancreas: No focal abnormality or ductal dilatation. Spleen: No focal abnormality.  Normal size. Adrenals/Urinary Tract: No adrenal abnormality. No focal renal abnormality. No stones or hydronephrosis. Urinary bladder is unremarkable. Stomach/Bowel: Diffuse colonic diverticulosis. This is most pronounced in the descending colon and sigmoid colon. No active diverticulitis. Normal appendix. Stomach and small bowel decompressed, unremarkable. Vascular/Lymphatic: No evidence of aneurysm or adenopathy. Reproductive: No visible focal abnormality. Other: No free fluid or free air. Musculoskeletal: No acute bony abnormality. Degenerative disc and facet disease in the lumbar spine. IMPRESSION: Diffuse colonic diverticulosis, most pronounced in the left colon. No active diverticulitis. Fatty infiltration of the liver. Electronically Signed   By: Rolm Baptise M.D.   On: 04/23/2019 19:47   ECHOCARDIOGRAM COMPLETE  Result Date: 04/27/2019   ECHOCARDIOGRAM REPORT   Patient Name:   [name] Date of Exam: 04/27/2019 Medical Rec #:  [id number]        Height:       71.0 in Accession #:    [id number]        Weight:       216.0 lb Date of Birth:  22-Dec-1960       BSA:          2.18 m Patient Age:    1 years         BP:           153/108 mmHg Patient Gender: M                HR:           81 bpm. Exam Location:  Inpatient Procedure: 2D Echo Indications:    hypertrophic cardiomyopathy  History:        Patient has prior history of Echocardiogram examinations, most                 recent 12/06/2013.  Sonographer:    Jannett Celestine RDCS (AE) Referring Phys: [id number] Kayleen Memos  Sonographer Comments: off axis apical windows IMPRESSIONS  1. Left ventricular ejection fraction, by visual estimation, is >75%. The left ventricle has hyperdynamic function. There is moderately increased left ventricular hypertrophy.  2. Left ventricular diastolic parameters are indeterminate.  3. The left ventricle has no regional wall motion abnormalities.  4. Global right ventricle has normal systolic function.The right ventricular size is normal. No increase in right ventricular wall thickness.  5. Left atrial size was normal.  6. Right atrial size was normal.  7. Trivial pericardial effusion is present.  8. The mitral valve is normal in structure. No evidence of mitral valve regurgitation.  9. The tricuspid valve is normal in structure. 10. The tricuspid valve is normal in structure. Tricuspid valve regurgitation is not demonstrated. 11. The aortic valve is grossly normal. Aortic valve regurgitation is not visualized. No evidence of aortic valve sclerosis or stenosis. 12. The pulmonic valve was grossly normal. Pulmonic valve regurgitation is not visualized. 13. Asymmetric LVH, more prominent in septum. No SAM or significant LVOT gradient noted on this study. FINDINGS  Left Ventricle: Left ventricular ejection fraction, by visual estimation, is >75%. The left ventricle has hyperdynamic function. The left ventricle has no regional wall motion abnormalities. There is moderately increased left ventricular hypertrophy. Asymmetric left ventricular  hypertrophy. Left ventricular diastolic parameters are indeterminate. Right Ventricle: The right ventricular size is normal. No increase in right ventricular wall thickness. Global RV systolic function is has normal systolic  function. Left Atrium: Left atrial size was normal in size. Right Atrium: Right atrial size was normal in size Pericardium: Trivial pericardial effusion is present. Mitral Valve: The mitral valve is normal in structure. No evidence of mitral valve regurgitation. Tricuspid Valve: The tricuspid valve is normal in structure. Tricuspid valve regurgitation is not demonstrated. Aortic Valve: The aortic valve is grossly normal. Aortic valve regurgitation is not visualized. The aortic valve is structurally normal, with no evidence of sclerosis or stenosis. Pulmonic Valve: The pulmonic valve was grossly normal. Pulmonic valve regurgitation is not visualized. Pulmonic regurgitation is not visualized. Aorta: The aortic root and ascending aorta are structurally normal, with no evidence of dilitation. Venous: The inferior vena cava was not well visualized. IAS/Shunts: No atrial level shunt detected by color flow Doppler.  LEFT VENTRICLE PLAX 2D LVIDd:         3.87 cm  Diastology LVIDs:         2.01 cm  LV e' lateral:   6.64 cm/s LV PW:         1.68 cm  LV E/e' lateral: 9.7 LV IVS:        1.90 cm  LV e' medial:    5.66 cm/s LVOT diam:     2.30 cm  LV E/e' medial:  11.4 LV SV:         52 ml LV SV Index:   23.14 LVOT Area:     4.15 cm  RIGHT VENTRICLE RV S prime:     10.70 cm/s TAPSE (M-mode): 2.6 cm LEFT ATRIUM         Index LA diam:    4.00 cm 1.84 cm/m  AORTIC VALVE LVOT Vmax:   101.00 cm/s LVOT Vmean:  76.700 cm/s LVOT VTI:    0.200 m  AORTA Ao Root diam: 3.00 cm MITRAL VALVE MV Area (PHT): 2.42 cm             SHUNTS MV PHT:        91.06 msec           Systemic VTI:  0.20 m MV Decel Time: 314 msec             Systemic Diam: 2.30 cm MV E velocity: 64.70 cm/s 103 cm/s MV A velocity: 53.10 cm/s 70.3 cm/s MV  E/A ratio:  1.22       1.5  Buford Dresser MD Electronically signed by Buford Dresser MD Signature Date/Time: 04/27/2019/2:41:43 PM    Final    CT Angio Abd/Pel w/ and/or w/o  Result Date: 04/25/2019 CLINICAL DATA:  Rectal bleeding EXAM: CTA ABDOMEN AND PELVIS WITHOUT AND WITH CONTRAST TECHNIQUE: Multidetector CT imaging of the abdomen and pelvis was performed using the standard protocol during bolus administration of intravenous contrast. Multiplanar reconstructed images and MIPs were obtained and reviewed to evaluate the vascular anatomy. CONTRAST:  144m OMNIPAQUE IOHEXOL 350 MG/ML SOLN COMPARISON:  04/23/2019 FINDINGS: VASCULAR Aorta: Minimal plaque. Normal caliber aorta without aneurysm, dissection, vasculitis or significant stenosis. Celiac: Patent without evidence of aneurysm, dissection, vasculitis or significant stenosis. SMA: Patent without evidence of aneurysm, dissection, vasculitis or significant stenosis. Renals: Single left, widely patent. Duplicated right, superior dominant, both patent. IMA: Patent without evidence of aneurysm, dissection, vasculitis or significant stenosis. Inflow: Minimal plaque. No aneurysm, dissection, stenosis, or active extravasation. Proximal Outflow: Minimal plaque. Bilateral common femoral and visualized portions of the superficial and profunda femoral arteries are patent without evidence of aneurysm, dissection, vasculitis or significant stenosis. Veins: Patent hepatic veins, portal  vein, splenic vein, SMV, IVC, renal veins. Iliac venous system unremarkable. No venous pathology identified. Review of the MIP images confirms the above findings. NON-VASCULAR Lower chest: No pleural or pericardial effusion. Distal esophagus decompressed. Hepatobiliary: Hyperdense material in the nondilated gallbladder presumably vicarious excretion of previously administered contrast material. No liver lesion or biliary ductal dilatation. Pancreas: Unremarkable. No pancreatic  ductal dilatation or surrounding inflammatory changes. Spleen: Normal in size without focal abnormality. Adrenals/Urinary Tract: Adrenal glands unremarkable. Kidneys enhance symmetrically, without hydronephrosis or urolithiasis evident. Urinary bladder physiologically distended. Stomach/Bowel: Stomach is nondistended. Small bowel decompressed. Appendix contains appendicular lith without inflammatory change or dilatation. The colon is nondilated, with innumerable diverticula throughout, most numerous in the sigmoid segment. No active extravasation identified. No adjacent inflammatory/edematous change or abscess. Lymphatic: No abdominal or pelvic adenopathy. Reproductive: Prostate is unremarkable. Other: Left pelvic phleboliths. No ascites. No free air. Musculoskeletal: Multilevel thoracolumbar spondylitic change. Grade 1 anterolisthesis L4-5 probably related to advanced facet DJD. Bilateral hip DJD. Negative for fracture or worrisome bone lesion. IMPRESSION: VASCULAR 1. No evidence of active extravasation. 2. Aortoiliac atherosclerosis (ICD10-170.0) without aneurysm, dissection, or significant stenosis. NON-VASCULAR 1. Extensive colonic diverticulosis. 2. 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 Electronically Signed   By: Lucrezia Europe M.D.   On: 04/25/2019 10:13    Microbiology: Recent Results (from the past 240 hour(s))  SARS Coronavirus 2 Ag (30 min TAT) - Nasal Swab (BD Veritor Kit)     Status: None   Collection Time: 04/23/19  9:53 PM   Specimen: Nasal Swab (BD Veritor Kit)  Result Value Ref Range Status   SARS Coronavirus 2 Ag NEGATIVE NEGATIVE Final    Comment: (NOTE) SARS-CoV-2 antigen NOT DETECTED.  Negative results are presumptive.  Negative results do not preclude SARS-CoV-2 infection and should not be used as the sole basis for treatment or other patient management decisions, including infection  control decisions, particularly in the presence of clinical signs and  symptoms  consistent with COVID-19, or in those who have been in contact with the virus.  Negative results must be combined with clinical observations, patient history, and epidemiological information. The expected result is Negative. Fact Sheet for Patients: PodPark.tn Fact Sheet for Healthcare Providers: GiftContent.is This test is not yet approved or cleared by the [place] FDA and  has been authorized for detection and/or diagnosis of SARS-CoV-2 by FDA under an Emergency Use Authorization (EUA).  This EUA will remain in effect (meaning this test can be used) for the duration of  the COVID-19 de claration under Section 564(b)(1) of the Act, 21 U.S.C. section 360bbb-3(b)(1), unless the authorization is terminated or revoked sooner. Performed at [facility], [address] [place], [place] [address]   SARS CORONAVIRUS 2 (TAT 6-24 HRS) Nasopharyngeal Nasopharyngeal Swab     Status: None   Collection Time: 04/24/19  2:15 AM   Specimen: Nasopharyngeal Swab  Result Value Ref Range Status   SARS Coronavirus 2 NEGATIVE NEGATIVE Final    Comment: (NOTE) SARS-CoV-2 target nucleic acids are NOT DETECTED. The SARS-CoV-2 RNA is generally detectable in upper and lower respiratory specimens during the acute phase of infection. Negative results do not preclude SARS-CoV-2 infection, do not rule out co-infections with other pathogens, and should not be used as the sole basis for treatment or other patient management decisions. Negative results must be combined with clinical observations, patient history, and epidemiological information. The expected result is Negative. Fact Sheet for Patients: SugarRoll.be Fact Sheet for Healthcare Providers:  https://www.woods-mathews.com/ This test is not yet approved or cleared by the [place] and  has been authorized for detection and/or  diagnosis of SARS-CoV-2 by FDA under an Emergency Use Authorization (EUA). This EUA will remain  in effect (meaning this test can be used) for the duration of the COVID-19 declaration under Section 56 4(b)(1) of the Act, 21 U.S.C. section 360bbb-3(b)(1), unless the authorization is terminated or revoked sooner. Performed at [facility], [place] [address], [place], [place] [address]      Labs: Basic Metabolic Panel: Recent Labs  Lab 04/24/19 0555 04/24/19 0555 04/25/19 0723 04/26/19 0136 04/27/19 0408 04/28/19 0138 04/29/19 0538  NA 141   < > 140 141 140 139 138  K 3.7   < > 3.6 3.7 3.6 3.6 3.7  CL 110   < > 110 111 110 110 107  CO2 23   < > '23 24 24 23 '$ 20*  GLUCOSE 119*   < > 98 102* 98 95 93  BUN 15   < > '11 9 9 9 10  '$ CREATININE 1.23   < > 1.33* 1.22 1.17 1.28* 1.20  CALCIUM 8.5*   < > 8.2* 8.6* 8.8* 8.7* 8.6*  MG 2.0  --   --   --   --   --  2.0   < > = values in this interval not displayed.   Liver Function Tests: Recent Labs  Lab 04/24/19 0555 04/25/19 0723 04/26/19 0136 04/27/19 0408  AST '22 20 22 20  '$ ALT '19 16 15 16  '$ ALKPHOS 62 52 53 56  BILITOT 0.8 1.0 1.1 0.9  PROT 6.7 6.1* 6.4* 6.5  ALBUMIN 3.5 3.2* 3.4* 3.7   No results for input(s): LIPASE, AMYLASE in the last 168 hours. No results for input(s): AMMONIA in the last 168 hours. CBC: Recent Labs  Lab 04/24/19 0013 04/24/19 0555 04/25/19 0723 04/25/19 1038 04/26/19 0136 04/26/19 0926 04/27/19 0408 04/27/19 0945 04/27/19 1730 04/28/19 0138 04/29/19 0538  WBC 7.8  --  8.1  --  9.1  --  8.9  --   --   --  10.1  NEUTROABS  --   --   --   --   --   --   --   --   --   --  6.9  HGB 10.2*   < > 8.7*   < > 8.8*   < > 9.4* 9.8* 9.8* 9.2* 9.5*  HCT 33.5*   < > 28.3*   < > 28.4*   < > 30.2* 31.5* 31.4* 29.9* 31.1*  MCV 72.2*  --  74.5*  --  75.1*  --  74.6*  --   --   --  76.4*  PLT 246  --  191  --  200  --  209  --   --   --  251   < > = values in this interval not displayed.   Cardiac  Enzymes: No results for input(s): CKTOTAL, CKMB, CKMBINDEX, TROPONINI in the last 168 hours. BNP: BNP (last 3 results) No results for input(s): BNP in the last 8760 hours.  ProBNP (last 3 results) No results for input(s): PROBNP in the last 8760 hours.  CBG: No results for input(s): GLUCAP in the last 168 hours.     Signed:  Kayleen Memos, MD [facility] 04/29/2019, 1:01 PM

## 2019-04-29 NOTE — Progress Notes (Addendum)
Progress Note  Patient Name: [name] Date of Encounter: 04/29/2019  Primary Cardiologist: Moss Mc, MD   Subjective   No chest pain or SOB, + headache has had since admit.  Inpatient Medications    Scheduled Meds:  amLODipine  5 mg Oral Daily   ferrous sulfate  325 mg Oral Q breakfast   mometasone-formoterol  2 puff Inhalation BID   pantoprazole  40 mg Oral Daily   polyethylene glycol  17 g Oral Daily   sodium chloride flush  3 mL Intravenous Once   sodium chloride flush  3 mL Intravenous Q12H   sotalol  80 mg Oral Q12H   umeclidinium bromide  1 puff Inhalation Daily   Continuous Infusions:   PRN Meds: acetaminophen **OR** acetaminophen, albuterol, fluticasone, morphine injection, ondansetron **OR** ondansetron (ZOFRAN) IV   Vital Signs    Vitals:   04/28/19 1412 04/28/19 2129 04/28/19 2242 04/29/19 0529  BP: (!) 153/95 (!) 149/91  128/77  Pulse: 69 82  62  Resp: 15 16  18   Temp: (!) 97.3 F (36.3 C) 99.2 F (37.3 C) 99.3 F (37.4 C) 97.9 F (36.6 C)  TempSrc: Oral  Oral Oral  SpO2: 100% 99%  97%  Weight:      Height:        Intake/Output Summary (Last 24 hours) at 04/29/2019 1137 Last data filed at 04/29/2019 0530 Gross per 24 hour  Intake 240 ml  Output 1700 ml  Net -1460 ml   Last 3 Weights 04/23/2019 03/19/2019 03/08/2019  Weight (lbs) 216 lb 215 lb 215 lb  Weight (kg) 97.977 kg 97.523 kg 97.523 kg      Telemetry    SR - Personally Reviewed  ECG    No new - Personally Reviewed  Physical Exam   GEN: No acute distress.  But headache Neck: No JVD Cardiac: RRR, no murmurs, rubs, or gallops.  Respiratory: Clear to auscultation bilaterally. GI: Soft, nontender, non-distended  MS: No edema; No deformity. Neuro:  Nonfocal  Psych: Normal affect   Labs    High Sensitivity Troponin:   Recent Labs  Lab 04/23/19 1643 04/23/19 1921  TROPONINIHS 5 5      Chemistry Recent Labs  Lab 04/25/19 0723 04/25/19 0723  04/26/19 0136 04/26/19 0136 04/27/19 0408 04/28/19 0138 04/29/19 0538  NA 140   < > 141   < > 140 139 138  K 3.6   < > 3.7   < > 3.6 3.6 3.7  CL 110   < > 111   < > 110 110 107  CO2 23   < > 24   < > 24 23 20*  GLUCOSE 98   < > 102*   < > 98 95 93  BUN 11   < > 9   < > 9 9 10   CREATININE 1.33*   < > 1.22   < > 1.17 1.28* 1.20  CALCIUM 8.2*   < > 8.6*   < > 8.8* 8.7* 8.6*  PROT 6.1*  --  6.4*  --  6.5  --   --   ALBUMIN 3.2*  --  3.4*  --  3.7  --   --   AST 20  --  22  --  20  --   --   ALT 16  --  15  --  16  --   --   ALKPHOS 52  --  53  --  56  --   --  BILITOT 1.0  --  1.1  --  0.9  --   --   GFRNONAA 58*   < > >60   < > >60 >60 >60  GFRAA >60   < > >60   < > >60 >60 >60  ANIONGAP 7   < > 6   < > 6 6 11    < > = values in this interval not displayed.     Hematology Recent Labs  Lab 04/26/19 0136 04/26/19 0926 04/27/19 0408 04/27/19 0945 04/27/19 1730 04/28/19 0138 04/29/19 0538  WBC 9.1  --  8.9  --   --   --  10.1  RBC 3.78*  --  4.05*  --   --   --  4.07*  HGB 8.8*   < > 9.4*   < > 9.8* 9.2* 9.5*  HCT 28.4*   < > 30.2*   < > 31.4* 29.9* 31.1*  MCV 75.1*  --  74.6*  --   --   --  76.4*  MCH 23.3*  --  23.2*  --   --   --  23.3*  MCHC 31.0  --  31.1  --   --   --  30.5  RDW 19.6*  --  19.5*  --   --   --  21.1*  PLT 200  --  209  --   --   --  251   < > = values in this interval not displayed.    BNPNo results for input(s): BNP, PROBNP in the last 168 hours.   DDimer No results for input(s): DDIMER in the last 168 hours.   Radiology    No results found.   Cardiac Studies   Echo  IMPRESSIONS      1. Left ventricular ejection fraction, by visual estimation, is >75%. The left ventricle has hyperdynamic function. There is moderately increased left ventricular hypertrophy.  2. Left ventricular diastolic parameters are indeterminate.  3. The left ventricle has no regional wall motion abnormalities.  4. Global right ventricle has normal systolic  function.The right ventricular size is normal. No increase in right ventricular wall thickness.  5. Left atrial size was normal.  6. Right atrial size was normal.  7. Trivial pericardial effusion is present.  8. The mitral valve is normal in structure. No evidence of mitral valve regurgitation.  9. The tricuspid valve is normal in structure. 10. The tricuspid valve is normal in structure. Tricuspid valve regurgitation is not demonstrated. 11. The aortic valve is grossly normal. Aortic valve regurgitation is not visualized. No evidence of aortic valve sclerosis or stenosis. 12. The pulmonic valve was grossly normal. Pulmonic valve regurgitation is not visualized. 13. Asymmetric LVH, more prominent in septum. No SAM or significant LVOT gradient noted on this study.   FINDINGS  Left Ventricle: Left ventricular ejection fraction, by visual estimation, is >75%. The left ventricle has hyperdynamic function. The left ventricle has no regional wall motion abnormalities. There is moderately increased left ventricular hypertrophy.  Asymmetric left ventricular hypertrophy. Left ventricular diastolic parameters are indeterminate.   Right Ventricle: The right ventricular size is normal. No increase in right ventricular wall thickness. Global RV systolic function is has normal systolic function.   Left Atrium: Left atrial size was normal in size.   Right Atrium: Right atrial size was normal in size   Pericardium: Trivial pericardial effusion is present.   Mitral Valve: The mitral valve is normal in structure. No evidence of mitral valve regurgitation.  Tricuspid Valve: The tricuspid valve is normal in structure. Tricuspid valve regurgitation is not demonstrated.   Aortic Valve: The aortic valve is grossly normal. Aortic valve regurgitation is not visualized. The aortic valve is structurally normal, with no evidence of sclerosis or stenosis.   Pulmonic Valve: The pulmonic valve was grossly normal.  Pulmonic valve regurgitation is not visualized. Pulmonic regurgitation is not visualized.   Aorta: The aortic root and ascending aorta are structurally normal, with no evidence of dilitation.   Venous: The inferior vena cava was not well visualized.   IAS/Shunts: No atrial level shunt detected by color flow Doppler.    Patient Profile     59 y.o. male  with a hx of LVH, AF s/p ablation, on sotalol,  TIA, OSA, Goiter, and SVT admitted with GI bleed.    Assessment & Plan    SVT with hx of PAF and ablation. Back on betapace at higher dose of 80 BID.  PAF with ablation 2018 - with CHA2DS2VASC of 3 - was on xarelto but may benefit from eliquis. Instead with GI bleed.  none for 1 week. Maintaining SR  Has rec'd 2 doses of betapace.  LVH, hx of this with prior work up.  cMRI in 2018 Acute GI bleed has been on xarelto as outpt- ? Change to eliquis? Per GI may resume in a week.  CHA2DS2VASc of 3, with hx of TIA on [facility] chart.  Syncope most likely due to acute GI bleed and syncope while up for BM.  He also notes with his SVT he has near syncope episodes. OSA but non compliant with CPAP. He lost his CPAP needs to see pulmonary to re-order and sleep study GI bleed with acute blood loss anemia and now on Iron has rec'd 1 unit PRBC  HTN has been on losartan, amlodipine, betapace 80 mg BID, added and amlodipine, losartan held.               For questions or updates, please contact [place] Please consult [email or web address] for contact info under        Signed, Pixie Casino, MD  04/29/2019, 11:37 AM

## 2019-04-29 NOTE — Discharge Instructions (Signed)
Gastrointestinal Bleeding Gastrointestinal (GI) bleeding is bleeding somewhere along the path that food travels through the body (digestive tract). This path is anywhere between the mouth and the opening of the butt (anus). You may have blood in your poop (stool) or have black poop. If you throw up (vomit), there may be blood in it. This condition can be mild, serious, or even life-threatening. If you have a lot of bleeding, you may need to stay in the hospital. What are the causes? This condition may be caused by:  Irritation and swelling of the esophagus (esophagitis). The esophagus is part of the body that moves food from your mouth to your stomach.  Swollen veins in the butt (hemorrhoids).  Areas of painful tearing in the opening of the butt (anal fissures). These are often caused by passing hard poop.  Pouches that form on the colon over time (diverticulosis).  Irritation and swelling (diverticulitis) in areas where pouches have formed on the colon.  Growths (polyps) or cancer. Colon cancer often starts out as growths that are not cancer.  Irritation of the stomach lining (gastritis).  Sores (ulcers) in the stomach. What increases the risk? You are more likely to develop this condition if you:  Have a certain type of infection in your stomach (Helicobacter pylori infection).  Take certain medicines.  Smoke.  Drink alcohol. What are the signs or symptoms? Common symptoms of this condition include:  Throwing up (vomiting) material that has bright red blood in it. It may look like coffee grounds.  Changes in your poop. The poop may: ? Have red blood in it. ? Be black, look like tar, and smell stronger than normal. ? Be red.  Pain or cramping in the belly (abdomen). How is this treated? Treatment for this condition depends on the cause of the bleeding. For example:  Sometimes, the bleeding can be stopped during a procedure that is done to find the problem (endoscopy or  colonoscopy).  Medicines can be used to: ? Help control irritation, swelling, or infection. ? Reduce acid in your stomach.  Certain problems can be treated with: ? Creams. ? Medicines that are put in the butt (suppositories). ? Warm baths.  Surgery is sometimes needed.  If you lose a lot of blood, you may need a blood transfusion. If bleeding is mild, you may be allowed to go home. If there is a lot of bleeding, you will need to stay in the hospital. Follow these instructions at home:   Take over-the-counter and prescription medicines only as told by your doctor.  Eat foods that have a lot of fiber in them. These foods include beans, whole grains, and fresh fruits and vegetables. You can also try eating 1-3 prunes each day.  Drink enough fluid to keep your pee (urine) pale yellow.  Keep all follow-up visits as told by your doctor. This is important. Contact a doctor if:  Your symptoms do not get better. Get help right away if:  Your bleeding does not stop.  You feel dizzy or you pass out (faint).  You feel weak.  You have very bad cramps in your back or belly.  You pass large clumps of blood (clots) in your poop.  Your symptoms are getting worse.  You have chest pain or fast heartbeats. Summary  GI bleeding is bleeding somewhere along the path that food travels through the body (digestive tract).  This bleeding can be caused by many things. Treatment depends on the cause of the bleeding.    Take medicines only as told by your doctor.  Keep all follow-up visits as told by your doctor. This is important. This information is not intended to replace advice given to you by your health care provider. Make sure you discuss any questions you have with your health care provider. Document Revised: 11/05/2017 Document Reviewed: 11/05/2017 Elsevier Patient Education  [employer].  Supraventricular Tachycardia, Adult Supraventricular tachycardia (SVT) is a kind of  abnormal heartbeat. It makes your heart beat very fast and then beat at a normal speed. A normal resting heartbeat is 60-100 times a minute. This condition can make your heart beat more than 150 times a minute. Times of having a fast heartbeat (episodes) can be scary, but they are usually not dangerous. In some cases, they may lead to heart failure if:  They happen many times per day.  Last longer than a few seconds. What are the causes?  A normal heartbeat starts when an area called the sinoatrial node sends out an electrical signal. In SVT, other areas of the heart send out signals that get in the way of the signal from the sinoatrial node. What increases the risk? You are more likely to develop this condition if you are:  73-75 years old.  A woman. The following factors may make you more likely to develop this condition:  Stress.  Tiredness.  Smoking.  Stimulant drugs, such as cocaine and methamphetamine.  Alcohol.  Caffeine.  Pregnancy.  Feeling worried or nervous (anxiety). What are the signs or symptoms?  A pounding heart.  A feeling that your heart is skipping beats (palpitations).  Weakness.  Trouble getting enough air.  Pain or tightness in your chest.  Feeling like you are going to pass out (faint).  Feeling worried or nervous.  Dizziness.  Sweating.  Feeling sick to your stomach (nausea).  Passing out.  Tiredness. Sometimes, there are no symptoms. How is this treated?  Vagal nerve stimulation. Ways to do this include: ? Holding your breath and pushing, as though you are pooping (having a bowel movement). ? Massaging an area on one side of your neck. Do not try this yourself. Only a doctor should do this. If done the wrong way, it can lead to a stroke. ? Bending forward with your head between your legs. ? Coughing while bending forward with your head between your legs. ? Closing your eyes and massaging your eyeballs. Ask a doctor how to do  this.  Medicines that prevent attacks.  Medicine to stop an attack given through an IV tube at the hospital.  A small electric shock (cardioversion) that stops an attack.  Radiofrequency ablation. In this procedure, a small, thin tube (catheter) is used to send energy to the area that is causing the rapid heartbeats. If you do not have symptoms, you may not need treatment. Follow these instructions at home: Stress  Avoid things that make you feel stressed.  To deal with stress, try: ? Doing yoga or meditation, or being out in nature. ? Listening to relaxing music. ? Doing deep breathing. ? Taking steps to be healthy, such as getting lots of sleep, exercising, and eating a balanced diet. ? Talking with a mental health doctor. Lifestyle   Try to get at least 7 hours of sleep each night.  Do not use any products that contain nicotine or tobacco, such as cigarettes, e-cigarettes, and chewing tobacco. If you need help quitting, ask your doctor.  Be aware of how alcohol affects you. ?  If alcohol gives you a fast heartbeat, do not drink alcohol. ? If alcohol does not seem to give you a fast heartbeat, limit alcohol use to no more than 1 drink a day for women who are not pregnant, and 2 drinks a day for men. In the [place] one drink is one of these:  12 oz of beer (355 mL).  5 oz of wine (148 mL).  1 oz of hard liquor (44 mL).  Be aware of how caffeine affects you. ? If caffeine gives you a fast heartbeat, do not eat, drink, or use anything with caffeine in it. ? If caffeine does not seem to give you a fast heartbeat, limit how much caffeine you eat, drink, or use.  Do not use stimulant drugs. If you need help quitting, ask your doctor. General instructions  Stay at a healthy weight.  Exercise regularly. Ask your doctor about good activities for you. Try one or a mixture of these: ? 150 minutes a week of gentle exercise, like walking or yoga. ? 75 minutes a week of exercise  that is very active, like running or swimming.  Do vagus nerve treatments to slow down your heartbeat as told by your doctor.  Take over-the-counter and prescription medicines only as told by your doctor.  Keep all follow-up visits as told by your doctor. This is important. Contact a doctor if:  You have a fast heartbeat more often.  Times of having a fast heartbeat last longer than before.  Home treatments to slow down your heartbeat do not help.  You have new symptoms. Get help right away if:  You have chest pain.  Your symptoms get worse.  You have trouble breathing.  Your heart beats very fast for more than 20 minutes.  You pass out. These symptoms may be an emergency. Do not wait to see if the symptoms will go away. Get medical help right away. Call your local emergency services (911 in the [place] Do not drive yourself to the hospital. Summary  SVT is a type of abnormal heart beat.  This condition can make your heart beat more than 150 times a minute.  Treatment depends on how often the condition happens and your symptoms. This information is not intended to replace advice given to you by your health care provider. Make sure you discuss any questions you have with your health care provider. Document Revised: 02/10/2018 Document Reviewed: 02/10/2018 Elsevier Patient Education  2020 [employer].

## 2019-06-16 DIAGNOSIS — K5521 Angiodysplasia of colon with hemorrhage: Secondary | ICD-10-CM | POA: Insufficient documentation

## 2019-06-21 DIAGNOSIS — I499 Cardiac arrhythmia, unspecified: Secondary | ICD-10-CM | POA: Insufficient documentation

## 2019-06-28 ENCOUNTER — Other Ambulatory Visit: Payer: Self-pay | Admitting: Family Medicine

## 2019-06-28 DIAGNOSIS — R200 Anesthesia of skin: Secondary | ICD-10-CM

## 2019-06-28 DIAGNOSIS — R531 Weakness: Secondary | ICD-10-CM

## 2019-06-29 ENCOUNTER — Other Ambulatory Visit: Payer: Self-pay | Admitting: Family Medicine

## 2019-06-29 DIAGNOSIS — M5416 Radiculopathy, lumbar region: Secondary | ICD-10-CM

## 2019-06-29 DIAGNOSIS — R200 Anesthesia of skin: Secondary | ICD-10-CM

## 2019-06-29 DIAGNOSIS — R531 Weakness: Secondary | ICD-10-CM

## 2019-07-27 ENCOUNTER — Ambulatory Visit
Admission: RE | Admit: 2019-07-27 | Discharge: 2019-07-27 | Disposition: A | Discharge status: Home / Self Care | Payer: Medicare Other | Source: Ambulatory Visit | Attending: Family Medicine | Admitting: Family Medicine

## 2019-07-27 ENCOUNTER — Other Ambulatory Visit: Payer: Self-pay

## 2019-07-27 DIAGNOSIS — R531 Weakness: Secondary | ICD-10-CM

## 2019-07-27 DIAGNOSIS — M5416 Radiculopathy, lumbar region: Secondary | ICD-10-CM

## 2019-07-27 DIAGNOSIS — R200 Anesthesia of skin: Secondary | ICD-10-CM

## 2019-07-27 MED ORDER — GADOBENATE DIMEGLUMINE 529 MG/ML IV SOLN
20.0000 mL | Freq: Once | INTRAVENOUS | Status: AC | PRN
  Administered 2019-07-27: 17:00:00 20 mL via INTRAVENOUS

## 2019-08-23 ENCOUNTER — Emergency Department (HOSPITAL_BASED_OUTPATIENT_CLINIC_OR_DEPARTMENT_OTHER): Payer: Medicare Other

## 2019-08-23 ENCOUNTER — Other Ambulatory Visit: Payer: Self-pay

## 2019-08-23 ENCOUNTER — Emergency Department (HOSPITAL_BASED_OUTPATIENT_CLINIC_OR_DEPARTMENT_OTHER)
Admission: EM | Admit: 2019-08-23 | Discharge: 2019-08-24 | Disposition: A | Discharge status: Home / Self Care | Payer: Medicare Other | Attending: Emergency Medicine | Admitting: Emergency Medicine

## 2019-08-23 ENCOUNTER — Encounter (HOSPITAL_BASED_OUTPATIENT_CLINIC_OR_DEPARTMENT_OTHER): Payer: Self-pay | Admitting: *Deleted

## 2019-08-23 DIAGNOSIS — N183 Chronic kidney disease, stage 3 unspecified: Secondary | ICD-10-CM | POA: Insufficient documentation

## 2019-08-23 DIAGNOSIS — J449 Chronic obstructive pulmonary disease, unspecified: Secondary | ICD-10-CM | POA: Insufficient documentation

## 2019-08-23 DIAGNOSIS — I82493 Acute embolism and thrombosis of other specified deep vein of lower extremity, bilateral: Secondary | ICD-10-CM

## 2019-08-23 DIAGNOSIS — Z79899 Other long term (current) drug therapy: Secondary | ICD-10-CM | POA: Diagnosis not present

## 2019-08-23 DIAGNOSIS — I4891 Unspecified atrial fibrillation: Secondary | ICD-10-CM | POA: Diagnosis not present

## 2019-08-23 DIAGNOSIS — R0789 Other chest pain: Secondary | ICD-10-CM | POA: Diagnosis present

## 2019-08-23 DIAGNOSIS — I129 Hypertensive chronic kidney disease with stage 1 through stage 4 chronic kidney disease, or unspecified chronic kidney disease: Secondary | ICD-10-CM | POA: Diagnosis not present

## 2019-08-23 DIAGNOSIS — R1013 Epigastric pain: Secondary | ICD-10-CM

## 2019-08-23 DIAGNOSIS — Z9861 Coronary angioplasty status: Secondary | ICD-10-CM | POA: Insufficient documentation

## 2019-08-23 LAB — CBC
HCT: 43.9 % (ref 39.0–52.0)
Hemoglobin: 14.6 g/dL (ref 13.0–17.0)
MCH: 27.5 pg (ref 26.0–34.0)
MCHC: 33.3 g/dL (ref 30.0–36.0)
MCV: 82.7 fL (ref 80.0–100.0)
Platelets: 269 10*3/uL (ref 150–400)
RBC: 5.31 MIL/uL (ref 4.22–5.81)
RDW: 13.9 % (ref 11.5–15.5)
WBC: 8.5 10*3/uL (ref 4.0–10.5)
nRBC: 0 % (ref 0.0–0.2)

## 2019-08-23 LAB — HEPATIC FUNCTION PANEL
ALT: 20 U/L (ref 0–44)
AST: 22 U/L (ref 15–41)
Albumin: 4.1 g/dL (ref 3.5–5.0)
Alkaline Phosphatase: 74 U/L (ref 38–126)
Bilirubin, Direct: <0.1 mg/dL (ref 0.0–0.2)
Total Bilirubin: 0.6 mg/dL (ref 0.3–1.2)
Total Protein: 7.9 g/dL (ref 6.5–8.1)

## 2019-08-23 LAB — BASIC METABOLIC PANEL
Anion gap: 10 (ref 5–15)
BUN: 13 mg/dL (ref 6–20)
CO2: 28 mmol/L (ref 22–32)
Calcium: 9.1 mg/dL (ref 8.9–10.3)
Chloride: 100 mmol/L (ref 98–111)
Creatinine, Ser: 1.31 mg/dL — ABNORMAL HIGH (ref 0.61–1.24)
GFR calc Af Amer: >60 mL/min (ref >60)
GFR calc non Af Amer: 59 mL/min — ABNORMAL LOW (ref >60)
Glucose, Bld: 97 mg/dL (ref 70–99)
Potassium: 3.6 mmol/L (ref 3.5–5.1)
Sodium: 138 mmol/L (ref 135–145)

## 2019-08-23 LAB — OCCULT BLOOD X 1 CARD TO LAB, STOOL: Fecal Occult Bld: NEGATIVE

## 2019-08-23 LAB — TROPONIN I (HIGH SENSITIVITY): Troponin I (High Sensitivity): 7 ng/L (ref <18)

## 2019-08-23 LAB — LIPASE, BLOOD: Lipase: 23 U/L (ref 11–51)

## 2019-08-23 MED ORDER — IOHEXOL 350 MG/ML SOLN
100.0000 mL | Freq: Once | INTRAVENOUS | Status: AC | PRN
  Administered 2019-08-23: 100 mL via INTRAVENOUS

## 2019-08-23 MED ORDER — SODIUM CHLORIDE 0.9 % IV BOLUS
1000.0000 mL | Freq: Once | INTRAVENOUS | Status: AC
  Administered 2019-08-24: 1000 mL via INTRAVENOUS

## 2019-08-23 NOTE — ED Notes (Signed)
ED Provider at bedside. 

## 2019-08-23 NOTE — ED Triage Notes (Signed)
Mid abdominal pain that radiates into his left chest. He had a cardiac ablation 2 weeks ago. Abdominal bloating.

## 2019-08-23 NOTE — ED Provider Notes (Signed)
  Bobby A Williamsis a 59 y.o.malewith medical history significant forPAF s/p ablation on Xarelto, hypertension, COPD, OSA, TIA, LVH with prior work-up (cMRI in 2018) presenting with epigastric and left-sided abdominal pain that radiates into his chest. This started about noon today lasted for several hours. It did resolve this evening but then returned several hours ago. Pain feels like pressure is left side of his stomach that radiates into his chest. He has had similar episodes before with esophageal spasm. No nausea or vomiting. Has had intermittent dark stools today. Recent ablation for Afib about 12 days ago.   No distress. Abdomen soft. Mild left upper quadrant and left lower quadrant tenderness without guarding or rebound. Intact femoral pulses bilaterally. EKG shows T wave inversions that are unchanged.  Hemoglobin today is improved from previous. Hemoccult is negative.  LFTs and lipase are normal. CT scan shows no evidence of aortic dissection or pulmonary embolism.  Does show abnormal enhancement of the right common femoral and external iliac vein.  This may be mixing artifact though DVT cannot be excluded.  This seems less likely as patient is anticoagulated. Patient did have a cardiac ablation 12 days ago with bilateral femoral access.  On exam there is no palpable hematoma or ecchymosis.  Palpable pulse. Intact DP and PT pulses bilaterally.  Troponin negative x2.  Pain has resolved after GI cocktail. ACS not favored given negative enzymes in setting of several hours of epigastric pain.  Pain is since resolved.  Patient did have a stress test in March 2021 that was negative as well as reassuring cardiac MRI. EKG is abnormal but unchanged.  Results discussed with patient.  He continues to take his Protonix at home.  Will add Carafate. Avoid alcohol, caffeine, NSAIDs.  Agreeable to return for ultrasound of his legs tomorrow which is not available tonight. Will attempt to evaluate for  pseudoaneurysm as well given recent femoral access procedure.   Ezequiel Essex, MD 08/24/19 [phone]

## 2019-08-23 NOTE — ED Provider Notes (Signed)
[facility] EMERGENCY DEPARTMENT Provider Note   CSN: [id number] Arrival date & time: 08/23/19  2132     History Chief Complaint  Patient presents with  . Abdominal Pain  . Chest Pain    [name] is a 59 y.o. male.  59 year old male with past medical history of A. fib, recently treated with ablation on May 5 also COPD, GERD, hypertension on Eliquis resents with complaint of discomfort in his abdomen and chest.  Patient states around noon today he had come home from work, even and had something to drink and while resting on the sofa developed a sharp stabbing pain in his abdomen that radiated up into his chest.  Pain was intense for several hours, somewhat improved after taking OTC antacid but remained present and dull in nature until pain became severe again this evening.  Patient states pain has somewhat eased at this time, has persistent left-sided abdominal pressure.  Symptoms were worse with exertion/ambulating to the bathroom.  Patient is on Linzess, reports to normal.  Bowel movements daily, no change, no difficulty voiding.  Patient thought that his symptoms may be due to esophageal spasm, has never had an episode of this intense before however which concerned his family and prompted evaluation in the ER tonight.  Denies shortness of breath, nausea, vomiting, diaphoresis.  No other complaints or concerns.  Patient is a non-smoker, does have a history of hypertension, reports 2 of his brothers died in their early 83s from heart attacks.        Past Medical History:  Diagnosis Date  . A-fib ([place])   . Chest pain    a. 11/2002 Cath: EF 68%, LM nl, LAD small, nl, LCX nl, RCA tortuous/nl;  b. 08/2011 St Echo: EF 60%, normal contractility, no scar/ischemia.  Marland Kitchen COPD (chronic obstructive pulmonary disease) ([place])   . Enlarged prostate   . GERD (gastroesophageal reflux disease)   . GI bleeding 08/2018  . Hypertension   . Sickle cell trait ([place])   . Sleep apnea   . SVT  (supraventricular tachycardia) [facility])     Patient Active Problem List   Diagnosis Date Noted  . COPD (chronic obstructive pulmonary disease) ([place])   . Syncope   . Chronic anticoagulation   . Acute blood loss anemia   . Acute lower GI bleeding 04/23/2019  . Cervical radiculopathy 03/08/2019  . Adenomatous polyp of transverse colon   . Gastrointestinal hemorrhage 08/25/2018  . CKD (chronic kidney disease) stage 3, GFR 30-59 ml/min 08/25/2018  . PAF (paroxysmal atrial fibrillation) ([place]) 08/25/2018  . Atypical chest pain 04/04/2017  . Abdominal pain 12/05/2013  . Diarrhea 12/05/2013  . Hypokalemia 12/05/2013  . Chest pain- atypical. Normal LHC 03/2013 03/10/2013  . HTN (hypertension) 03/10/2013  . Nonspecific abnormal electrocardiogram (ECG) (EKG) 03/10/2013    Past Surgical History:  Procedure Laterality Date  . ATRIAL FIBRILLATION ABLATION    . BIOPSY  08/27/2018   Procedure: BIOPSY;  Surgeon: Thornton Park, MD;  Location: [place];  Service: Gastroenterology;;  . BIOPSY  08/28/2018   Procedure: BIOPSY;  Surgeon: Thornton Park, MD;  Location: [place];  Service: Gastroenterology;;  . COLONOSCOPY WITH PROPOFOL N/A 08/28/2018   Procedure: COLONOSCOPY WITH PROPOFOL;  Surgeon: Thornton Park, MD;  Location: [place];  Service: Gastroenterology;  Laterality: N/A;  . ENTEROSCOPY N/A 08/27/2018   Procedure: ENTEROSCOPY;  Surgeon: Thornton Park, MD;  Location: [place];  Service: Gastroenterology;  Laterality: N/A;  . GIVENS CAPSULE STUDY N/A 08/28/2018  Procedure: GIVENS CAPSULE STUDY;  Surgeon: Thornton Park, MD;  Location: [place];  Service: Gastroenterology;  Laterality: N/A;  . LEFT HEART CATHETERIZATION WITH CORONARY ANGIOGRAM N/A 03/11/2013   Procedure: LEFT HEART CATHETERIZATION WITH CORONARY ANGIOGRAM;  Surgeon: Jolaine Artist, MD;  Location: [facility] CATH LAB;  Service: Cardiovascular;  Laterality: N/A;  . POLYPECTOMY  08/28/2018   Procedure:  POLYPECTOMY;  Surgeon: Thornton Park, MD;  Location: [place];  Service: Gastroenterology;;  . TONSILLECTOMY    . TRANSURETHRAL RESECTION OF PROSTATE         Family History  Problem Relation Age of Onset  . CAD Mother        died in her 18's - MI  . Multiple myeloma Brother   . Sickle cell anemia Father        died @ 52  . Gastric cancer Neg Hx     Social History   Tobacco Use  . Smoking status: Never Smoker  . Smokeless tobacco: Never Used  Substance Use Topics  . Alcohol use: No  . Drug use: Never    Home Medications Prior to Admission medications   Medication Sig Start Date End Date Taking? Authorizing Provider  acetaminophen (TYLENOL) 325 MG tablet Take 650 mg by mouth every 6 (six) hours as needed for moderate pain.    [provider]  amLODipine (NORVASC) 5 MG tablet Take 1 tablet (5 mg total) by mouth daily. 04/30/19 05/30/19  Kayleen Memos, DO  apixaban (ELIQUIS) 5 MG TABS tablet Take 1 tablet (5 mg total) by mouth 2 (two) times daily. Take Eliquis 5 mg twice a day one week after GI bleed.  Do not restart Xarelto. 05/06/19   Kayleen Memos, DO  cetirizine (ZYRTEC) 10 MG tablet Take 10 mg by mouth daily as needed for allergies.     [provider]  ferrous sulfate 325 (65 FE) MG tablet Take 1 tablet (325 mg total) by mouth daily with breakfast. 04/30/19 06/29/19  Kayleen Memos, DO  fluticasone (FLONASE) 50 MCG/ACT nasal spray Place 1 spray into both nostrils daily as needed for allergies or rhinitis.    [provider]  Fluticasone-Salmeterol (ADVAIR) 250-50 MCG/DOSE AEPB Inhale 1 puff into the lungs 2 (two) times daily as needed (wheezing).    [provider]  nitroGLYCERIN (NITROSTAT) 0.4 MG SL tablet Place 1 tablet (0.4 mg total) under the tongue every 5 (five) minutes as needed for chest pain. 03/12/13   Thurnell Lose, MD  ondansetron (ZOFRAN) 8 MG tablet Take 8 mg by mouth every 8 (eight) hours as needed for nausea or  vomiting.  10/20/18   [provider]  pantoprazole (PROTONIX) 40 MG tablet Take 40 mg by mouth daily. 07/28/14   [provider]  polyethylene glycol (MIRALAX / GLYCOLAX) 17 g packet Take 17 g by mouth daily. 04/30/19   Kayleen Memos, DO  sildenafil (REVATIO) 20 MG tablet TAKE 3-5 TABLETS BY MOUTH AS DIRECTED 11/25/18   [provider]  sotalol (BETAPACE) 80 MG tablet Take 1 tablet (80 mg total) by mouth every 12 (twelve) hours. 04/29/19 05/29/19  Kayleen Memos, DO  traMADol (ULTRAM) 50 MG tablet Take 50 mg by mouth every 6 (six) hours as needed for moderate pain.    [provider]  gabapentin (NEURONTIN) 300 MG capsule Take 300 mg by mouth at bedtime.  02/26/19  [provider]    Allergies    Patient has no known allergies.  Review  of Systems   Review of Systems  Constitutional: Negative for chills, diaphoresis and fever.  Respiratory: Negative for shortness of breath.   Cardiovascular: Positive for chest pain.  Gastrointestinal: Positive for abdominal pain. Negative for blood in stool, constipation, diarrhea, nausea and vomiting.  Genitourinary: Negative for difficulty urinating.  Musculoskeletal: Negative for back pain.  Skin: Negative for rash and wound.  Allergic/Immunologic: Negative for immunocompromised state.  Neurological: Negative for weakness.  All other systems reviewed and are negative.   Physical Exam Updated Vital Signs BP (!) 116/95   Pulse 62   Temp 98.5 F (36.9 C) (Oral)   Resp 17   Ht _0  (1.803 m)   Wt 98 kg   SpO2 99%   BMI 30.13 kg/m   Physical Exam Vitals and nursing note reviewed.  Constitutional:      General: He is not in acute distress.    Appearance: He is well-developed. He is not diaphoretic.  HENT:     Head: Normocephalic and atraumatic.  Cardiovascular:     Rate and Rhythm: Regular rhythm.     Heart sounds: Normal heart sounds.  Pulmonary:     Effort: Pulmonary effort is normal.      Breath sounds: Normal breath sounds.  Abdominal:     Palpations: Abdomen is soft.     Tenderness: There is abdominal tenderness in the left upper quadrant.  Skin:    General: Skin is warm and dry.     Findings: No erythema or rash.  Neurological:     Mental Status: He is alert and oriented to person, place, and time.  Psychiatric:        Behavior: Behavior normal.     ED Results / Procedures / Treatments   Labs (all labs ordered are listed, but only abnormal results are displayed) Labs Reviewed  BASIC METABOLIC PANEL - Abnormal; Notable for the following components:      Result Value   Creatinine, Ser 1.31 (*)    GFR calc non Af Amer 59 (*)    All other components within normal limits  CBC  LIPASE, BLOOD  HEPATIC FUNCTION PANEL  OCCULT BLOOD X 1 CARD TO LAB, STOOL  TROPONIN I (HIGH SENSITIVITY)    EKG EKG Interpretation  Date/Time:  Monday Aug 23 2019 21:33:44 EDT Ventricular Rate:  69 PR Interval:  196 QRS Duration: 124 QT Interval:  416 QTC Calculation: 445 R Axis:   -79 Text Interpretation: Normal sinus rhythm Left anterior fascicular block Minimal voltage criteria for LVH, may be normal variant ( Cornell product ) Abnormal ECG No significant change since last tracing Confirmed by Isla Pence [id number]) on 08/23/2019 10:37:07 PM   Radiology DG Chest Port 1 View  Result Date: 08/23/2019 CLINICAL DATA:  Chest pain EXAM: PORTABLE CHEST 1 VIEW COMPARISON:  April 23, 2019 FINDINGS: The heart size and mediastinal contours are within normal limits. Both lungs are clear. The visualized skeletal structures are unremarkable. IMPRESSION: No active disease. Electronically Signed   By: Constance Holster M.D.   On: 08/23/2019 22:33    Procedures Procedures (including critical care time)  Medications Ordered in ED Medications  sodium chloride 0.9 % bolus 1,000 mL (has no administration in time range)    ED Course  I have reviewed the triage vital signs and the nursing  notes.  Pertinent labs & imaging results that were available during my care of the patient were reviewed by me and considered in my medical decision making (see chart for  details).  Clinical Course as of Aug 22 2313  Mon Aug 23, 2019  2304 58yo male with history as above, on exam found to have LUQ tenderness, no chest wall tenderness. Pulses equal in extremities. No distress at this time. Initial troponin 7, will plan for delta troponin. BMP with slight increase in Cr to 1.31, CBC WNL. EKG no acute ischemic changes, abnormal but unchanged compared to previous.  Case discussed with Dr. Wyvonnia Dusky, ER attending, will add LFTs and lipase.   [LM]  2314 Care signed out to Dr. Wyvonnia Dusky pending remaining work up.    [LM]    Clinical Course User Index [LM] Roque Lias   MDM Rules/Calculators/A&P                      Final Clinical Impression(s) / ED Diagnoses Final diagnoses:  None    Rx / DC Orders ED Discharge Orders    None       Roque Lias 08/23/19 2315    Ezequiel Essex, MD 08/24/19 0151

## 2019-08-24 ENCOUNTER — Ambulatory Visit (HOSPITAL_BASED_OUTPATIENT_CLINIC_OR_DEPARTMENT_OTHER)
Admission: RE | Admit: 2019-08-24 | Discharge: 2019-08-24 | Disposition: A | Discharge status: Home / Self Care | Payer: Medicare Other | Source: Ambulatory Visit | Attending: Emergency Medicine | Admitting: Emergency Medicine

## 2019-08-24 DIAGNOSIS — I82493 Acute embolism and thrombosis of other specified deep vein of lower extremity, bilateral: Secondary | ICD-10-CM

## 2019-08-24 LAB — TROPONIN I (HIGH SENSITIVITY): Troponin I (High Sensitivity): 7 ng/L (ref <18)

## 2019-08-24 MED ORDER — SUCRALFATE 1 G PO TABS
1.0000 g | ORAL_TABLET | Freq: Three times a day (TID) | ORAL | 0 refills | Status: DC
Start: 2019-08-24 — End: 2020-02-28

## 2019-08-24 MED ORDER — LIDOCAINE VISCOUS HCL 2 % MT SOLN
15.0000 mL | Freq: Once | OROMUCOSAL | Status: AC
  Administered 2019-08-24: 15 mL via ORAL
  Filled 2019-08-24: qty 15

## 2019-08-24 MED ORDER — ALUM & MAG HYDROXIDE-SIMETH 200-200-20 MG/5ML PO SUSP
30.0000 mL | Freq: Once | ORAL | Status: AC
  Administered 2019-08-24: 30 mL via ORAL
  Filled 2019-08-24: qty 30

## 2019-08-24 NOTE — ED Provider Notes (Signed)
[place] results noted. No DVT. 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 D/w Dr. Scot Dock of vascular surgery who advises monitoring and followup with his specialists at [facility]. No indication for any acute intervention.   Ezequiel Essex, MD 08/24/19 2017

## 2019-08-24 NOTE — Discharge Instructions (Addendum)
Your testing today shows no evidence of heart attack or blood clot in the lung.  As we discussed you should follow up with your cardiologist for recheck this week.  Return tomorrow for ultrasound of the blood vessels in your legs because they appeared abnormal on CT scan today and this may be related to your recent ablation procedure. You should also followup with your primary doctor for an ultrasound of your thyroid gland as the CT scan did show a nodule today. Take your stomach medication as prescribed and add the medication called Carafate.  Avoid alcohol, caffeine, spicy foods, NSAID medications.  Return to the ED with worsening symptoms including exertional chest pain, shortness of breath, intractable nausea and vomiting, any other concerns.

## 2019-09-03 ENCOUNTER — Ambulatory Visit (HOSPITAL_BASED_OUTPATIENT_CLINIC_OR_DEPARTMENT_OTHER): Admission: RE | Admit: 2019-09-03 | Payer: Medicare Other | Source: Ambulatory Visit

## 2020-02-14 ENCOUNTER — Emergency Department (HOSPITAL_BASED_OUTPATIENT_CLINIC_OR_DEPARTMENT_OTHER): Payer: Medicare Other

## 2020-02-14 ENCOUNTER — Other Ambulatory Visit: Payer: Self-pay

## 2020-02-14 ENCOUNTER — Observation Stay (HOSPITAL_BASED_OUTPATIENT_CLINIC_OR_DEPARTMENT_OTHER)
Admission: EM | Admit: 2020-02-14 | Discharge: 2020-02-15 | Disposition: A | Discharge status: Home / Self Care | Payer: Medicare Other | Attending: Cardiology | Admitting: Cardiology

## 2020-02-14 ENCOUNTER — Encounter (HOSPITAL_BASED_OUTPATIENT_CLINIC_OR_DEPARTMENT_OTHER): Payer: Self-pay

## 2020-02-14 DIAGNOSIS — Z7901 Long term (current) use of anticoagulants: Secondary | ICD-10-CM

## 2020-02-14 DIAGNOSIS — R0602 Shortness of breath: Secondary | ICD-10-CM | POA: Insufficient documentation

## 2020-02-14 DIAGNOSIS — Z79899 Other long term (current) drug therapy: Secondary | ICD-10-CM | POA: Diagnosis not present

## 2020-02-14 DIAGNOSIS — I480 Paroxysmal atrial fibrillation: Secondary | ICD-10-CM | POA: Diagnosis present

## 2020-02-14 DIAGNOSIS — R7303 Prediabetes: Secondary | ICD-10-CM | POA: Insufficient documentation

## 2020-02-14 DIAGNOSIS — I129 Hypertensive chronic kidney disease with stage 1 through stage 4 chronic kidney disease, or unspecified chronic kidney disease: Secondary | ICD-10-CM | POA: Diagnosis not present

## 2020-02-14 DIAGNOSIS — Z23 Encounter for immunization: Secondary | ICD-10-CM | POA: Insufficient documentation

## 2020-02-14 DIAGNOSIS — Z20822 Contact with and (suspected) exposure to covid-19: Secondary | ICD-10-CM | POA: Diagnosis not present

## 2020-02-14 DIAGNOSIS — R079 Chest pain, unspecified: Secondary | ICD-10-CM | POA: Diagnosis present

## 2020-02-14 DIAGNOSIS — R109 Unspecified abdominal pain: Secondary | ICD-10-CM | POA: Diagnosis present

## 2020-02-14 DIAGNOSIS — R0789 Other chest pain: Principal | ICD-10-CM | POA: Insufficient documentation

## 2020-02-14 DIAGNOSIS — N183 Chronic kidney disease, stage 3 unspecified: Secondary | ICD-10-CM | POA: Insufficient documentation

## 2020-02-14 LAB — TROPONIN I (HIGH SENSITIVITY)
Troponin I (High Sensitivity): 8 ng/L (ref <18)
Troponin I (High Sensitivity): 7 ng/L (ref <18)

## 2020-02-14 LAB — CBC
HCT: 45 % (ref 39.0–52.0)
Hemoglobin: 15.2 g/dL (ref 13.0–17.0)
MCH: 28.8 pg (ref 26.0–34.0)
MCHC: 33.8 g/dL (ref 30.0–36.0)
MCV: 85.4 fL (ref 80.0–100.0)
Platelets: 219 10*3/uL (ref 150–400)
RBC: 5.27 MIL/uL (ref 4.22–5.81)
RDW: 12.9 % (ref 11.5–15.5)
WBC: 6.5 10*3/uL (ref 4.0–10.5)
nRBC: 0 % (ref 0.0–0.2)

## 2020-02-14 LAB — BASIC METABOLIC PANEL
Anion gap: 11 (ref 5–15)
BUN: 15 mg/dL (ref 6–20)
CO2: 26 mmol/L (ref 22–32)
Calcium: 9.8 mg/dL (ref 8.9–10.3)
Chloride: 104 mmol/L (ref 98–111)
Creatinine, Ser: 1.33 mg/dL — ABNORMAL HIGH (ref 0.61–1.24)
GFR, Estimated: >60 mL/min (ref >60)
Glucose, Bld: 86 mg/dL (ref 70–99)
Potassium: 3.1 mmol/L — ABNORMAL LOW (ref 3.5–5.1)
Sodium: 141 mmol/L (ref 135–145)

## 2020-02-14 LAB — RESPIRATORY PANEL BY RT PCR (FLU A&B, COVID)
Influenza A by PCR: NEGATIVE
Influenza B by PCR: NEGATIVE
SARS Coronavirus 2 by RT PCR: NEGATIVE

## 2020-02-14 MED ORDER — NITROGLYCERIN 0.4 MG SL SUBL
0.4000 mg | SUBLINGUAL_TABLET | SUBLINGUAL | Status: DC | PRN
  Administered 2020-02-14 (×2): 0.4 mg via SUBLINGUAL
  Filled 2020-02-14 (×2): qty 1

## 2020-02-14 MED ORDER — ASPIRIN 81 MG PO CHEW
324.0000 mg | CHEWABLE_TABLET | Freq: Once | ORAL | Status: AC
  Administered 2020-02-14: 324 mg via ORAL
  Filled 2020-02-14: qty 4

## 2020-02-14 MED ORDER — KETOROLAC TROMETHAMINE 15 MG/ML IJ SOLN
15.0000 mg | Freq: Once | INTRAMUSCULAR | Status: AC
  Administered 2020-02-14: 15 mg via INTRAVENOUS
  Filled 2020-02-14: qty 1

## 2020-02-14 MED ORDER — INFLUENZA VAC SPLIT QUAD 0.5 ML IM SUSY
0.5000 mL | PREFILLED_SYRINGE | INTRAMUSCULAR | Status: AC
  Administered 2020-02-15: 0.5 mL via INTRAMUSCULAR
  Filled 2020-02-14: qty 0.5

## 2020-02-14 MED ORDER — ALUM & MAG HYDROXIDE-SIMETH 200-200-20 MG/5ML PO SUSP
30.0000 mL | Freq: Once | ORAL | Status: AC
  Administered 2020-02-14: 30 mL via ORAL
  Filled 2020-02-14: qty 30

## 2020-02-14 MED ORDER — POTASSIUM CHLORIDE CRYS ER 20 MEQ PO TBCR
40.0000 meq | EXTENDED_RELEASE_TABLET | Freq: Once | ORAL | Status: AC
  Administered 2020-02-14: 40 meq via ORAL
  Filled 2020-02-14: qty 2

## 2020-02-14 NOTE — ED Triage Notes (Addendum)
Pt c/o CP started this am-NAD-to triage in w/c-pt later added c/o abd pain x today with constipation

## 2020-02-14 NOTE — ED Notes (Signed)
Pt departs ED with [facility] at this time

## 2020-02-14 NOTE — ED Provider Notes (Signed)
[facility] EMERGENCY DEPARTMENT Provider Note   CSN: [id number] Arrival date & time: 02/14/20  1506     History Chief Complaint  Patient presents with  . Chest Pain    [name] is a 60 y.o. male with a history of paroxysmal A. fib on Eliquis, presenting to the emergency department with chest pain.  Patient reports that he felt "tired and weak over the weekend".  He says he woke up this morning with a sensation of pressure on the left side of his chest in his epigastrium.  He went to work today but the pain never went away.  He has had similar symptoms in the past, but usually the symptoms resolve himself.  He does suffer from reflux but he feels like this pain is different.  The pain is currently a 9 out of 10.  Does not travel radiate anywhere.  Nothing has made it better or worse.  His cardiologist is at [facility].   He denies smoking hx, or HLD  He reports hx of HTN, pre-diabetes, and sig family hx of MI in 2 brothers in their 67's   HPI     Past Medical History:  Diagnosis Date  . A-fib (HCC)   . Chest pain    a. 11/2002 Cath: EF 68%, LM nl, LAD small, nl, LCX nl, RCA tortuous/nl;  b. 08/2011 St Echo: EF 60%, normal contractility, no scar/ischemia.  Marland Kitchen COPD (chronic obstructive pulmonary disease) (HCC)   . Enlarged prostate   . GERD (gastroesophageal reflux disease)   . GI bleeding 08/2018  . Hypertension   . Sickle cell trait (HCC)   . Sleep apnea   . SVT (supraventricular tachycardia) [facility])     Patient Active Problem List   Diagnosis Date Noted  . COPD (chronic obstructive pulmonary disease) (HCC)   . Syncope   . Chronic anticoagulation   . Acute blood loss anemia   . Acute lower GI bleeding 04/23/2019  . Cervical radiculopathy 03/08/2019  . Adenomatous polyp of transverse colon   . Gastrointestinal hemorrhage 08/25/2018  . CKD (chronic kidney disease) stage 3, GFR 30-59 ml/min 08/25/2018  . PAF (paroxysmal atrial fibrillation) (HCC) 08/25/2018  .  Atypical chest pain 04/04/2017  . Abdominal pain 12/05/2013  . Diarrhea 12/05/2013  . Hypokalemia 12/05/2013  . Chest pain- atypical. Normal LHC 03/2013 03/10/2013  . HTN (hypertension) 03/10/2013  . Nonspecific abnormal electrocardiogram (ECG) (EKG) 03/10/2013    Past Surgical History:  Procedure Laterality Date  . ATRIAL FIBRILLATION ABLATION    . BIOPSY  08/27/2018   Procedure: BIOPSY;  Surgeon: Tressia Danas, MD;  Location: [facility] ENDOSCOPY;  Service: Gastroenterology;;  . BIOPSY  08/28/2018   Procedure: BIOPSY;  Surgeon: Tressia Danas, MD;  Location: [facility] ENDOSCOPY;  Service: Gastroenterology;;  . COLONOSCOPY WITH PROPOFOL N/A 08/28/2018   Procedure: COLONOSCOPY WITH PROPOFOL;  Surgeon: Tressia Danas, MD;  Location: [facility] ENDOSCOPY;  Service: Gastroenterology;  Laterality: N/A;  . ENTEROSCOPY N/A 08/27/2018   Procedure: ENTEROSCOPY;  Surgeon: Tressia Danas, MD;  Location: [facility] ENDOSCOPY;  Service: Gastroenterology;  Laterality: N/A;  . GIVENS CAPSULE STUDY N/A 08/28/2018   Procedure: GIVENS CAPSULE STUDY;  Surgeon: Tressia Danas, MD;  Location: [facility] ENDOSCOPY;  Service: Gastroenterology;  Laterality: N/A;  . LEFT HEART CATHETERIZATION WITH CORONARY ANGIOGRAM N/A 03/11/2013   Procedure: LEFT HEART CATHETERIZATION WITH CORONARY ANGIOGRAM;  Surgeon: Dolores Patty, MD;  Location: [facility] CATH LAB;  Service: Cardiovascular;  Laterality: N/A;  . POLYPECTOMY  08/28/2018   Procedure:  POLYPECTOMY;  Surgeon: Thornton Park, MD;  Location: [place];  Service: Gastroenterology;;  . TONSILLECTOMY    . TRANSURETHRAL RESECTION OF PROSTATE         Family History  Problem Relation Age of Onset  . CAD Mother        died in her 25's - MI  . Multiple myeloma Brother   . Sickle cell anemia Father        died @ 60  . Gastric cancer Neg Hx     Social History   Tobacco Use  . Smoking status: Never Smoker  . Smokeless tobacco: Never Used  Vaping Use  . Vaping Use: Never used    Substance Use Topics  . Alcohol use: No  . Drug use: Never    Home Medications Prior to Admission medications   Medication Sig Start Date End Date Taking? Authorizing Provider  acetaminophen (TYLENOL) 325 MG tablet Take 650 mg by mouth every 6 (six) hours as needed for moderate pain.   Yes [provider]  amLODipine (NORVASC) 5 MG tablet Take 1 tablet (5 mg total) by mouth daily. 04/30/19 02/15/20 Yes Kayleen Memos, DO  apixaban (ELIQUIS) 5 MG TABS tablet Take 1 tablet (5 mg total) by mouth 2 (two) times daily. Take Eliquis 5 mg twice a day one week after GI bleed.  Do not restart Xarelto. Patient taking differently: Take 5 mg by mouth 2 (two) times daily.  05/06/19  Yes Kayleen Memos, DO  cetirizine (ZYRTEC) 10 MG tablet Take 10 mg by mouth daily as needed for allergies.    Yes [provider]  Colchicine 0.6 MG CAPS Take 0.6 mg by mouth 2 (two) times daily as needed. Gout flares 01/17/20  Yes [provider]  ferrous sulfate 325 (65 FE) MG tablet Take 1 tablet (325 mg total) by mouth daily with breakfast. 04/30/19 02/15/20 Yes Hall, Carole N, DO  fluticasone (FLONASE) 50 MCG/ACT nasal spray Place 1 spray into both nostrils daily as needed for allergies or rhinitis.   Yes [provider]  Fluticasone-Salmeterol (ADVAIR) 250-50 MCG/DOSE AEPB Inhale 1 puff into the lungs 2 (two) times daily as needed (wheezing).   Yes [provider]  LINZESS 290 MCG CAPS capsule Take 290 mcg by mouth every other day. 10/07/19  Yes [provider]  nitroGLYCERIN (NITROSTAT) 0.4 MG SL tablet Place 1 tablet (0.4 mg total) under the tongue every 5 (five) minutes as needed for chest pain. 03/12/13  Yes Thurnell Lose, MD  ondansetron (ZOFRAN) 8 MG tablet Take 8 mg by mouth every 8 (eight) hours as needed for nausea or vomiting.  10/20/18  Yes [provider]  oxyCODONE-acetaminophen (PERCOCET/ROXICET) 5-325 MG tablet Take 1 tablet by mouth every 6 (six)  hours as needed for moderate pain or severe pain.    Yes [provider]  pantoprazole (PROTONIX) 40 MG tablet Take 40 mg by mouth daily. 07/28/14  Yes [provider]  polyethylene glycol (MIRALAX / GLYCOLAX) 17 g packet Take 17 g by mouth daily. Patient taking differently: Take 17 g by mouth daily as needed for mild constipation.  04/30/19  Yes Irene Pap N, DO  sildenafil (REVATIO) 20 MG tablet Take 60-100 mg by mouth as directed.  11/25/18  Yes [provider]  sotalol (BETAPACE) 80 MG tablet Take 1 tablet (80 mg total) by mouth every 12 (twelve) hours. 04/29/19 02/15/20 Yes Hall, Lorenda Cahill, DO  SUMAtriptan (IMITREX) 100 MG tablet Take 100 mg  by mouth as directed. Take one tablet at onset, then an additional tablet every 12 hours as needed for migraine 02/04/20  Yes [provider]  traMADol (ULTRAM) 50 MG tablet Take 50 mg by mouth every 6 (six) hours as needed for moderate pain.   Yes [provider]  triamterene-hydrochlorothiazide (DYAZIDE) 37.5-25 MG capsule Take 1 capsule by mouth every morning. 11/27/19  Yes [provider]  sucralfate (CARAFATE) 1 g tablet Take 1 tablet (1 g total) by mouth 4 (four) times daily -  with meals and at bedtime. Patient not taking: Reported on 02/15/2020 08/24/19   Ezequiel Essex, MD  gabapentin (NEURONTIN) 300 MG capsule Take 300 mg by mouth at bedtime.  02/26/19  [provider]    Allergies    Patient has no known allergies.  Review of Systems   Review of Systems  Constitutional: Negative for chills and fever.  HENT: Negative for ear pain and sore throat.   Eyes: Negative for pain and visual disturbance.  Respiratory: Negative for cough and shortness of breath.   Cardiovascular: Positive for chest pain. Negative for palpitations.  Gastrointestinal: Positive for nausea. Negative for abdominal pain and vomiting.  Genitourinary: Negative for dysuria and hematuria.  Musculoskeletal: Negative  for arthralgias and back pain.  Skin: Negative for color change and rash.  Neurological: Negative for syncope and headaches.  All other systems reviewed and are negative.   Physical Exam Updated Vital Signs BP 117/77   Pulse (!) 57   Temp 98.4 F (36.9 C) (Oral)   Resp 13   Ht $R'5\' 11"'WF$  (1.803 m)   Wt 94 kg   SpO2 95%   BMI 28.90 kg/m   Physical Exam Vitals and nursing note reviewed.  Constitutional:      Appearance: He is well-developed.  HENT:     Head: Normocephalic and atraumatic.  Eyes:     Conjunctiva/sclera: Conjunctivae normal.  Cardiovascular:     Rate and Rhythm: Normal rate and regular rhythm.     Heart sounds: No murmur heard.   Pulmonary:     Effort: Pulmonary effort is normal. No respiratory distress.     Breath sounds: Normal breath sounds.  Abdominal:     Palpations: Abdomen is soft.     Tenderness: There is no abdominal tenderness.  Musculoskeletal:     Cervical back: Neck supple.  Skin:    General: Skin is warm and dry.  Neurological:     General: No focal deficit present.     Mental Status: He is alert and oriented to person, place, and time.  Psychiatric:        Mood and Affect: Mood normal.        Behavior: Behavior normal.     ED Results / Procedures / Treatments   Labs (all labs ordered are listed, but only abnormal results are displayed) Labs Reviewed  BASIC METABOLIC PANEL - Abnormal; Notable for the following components:      Result Value   Potassium 3.1 (*)    Creatinine, Ser 1.33 (*)    All other components within normal limits  HEMOGLOBIN A1C - Abnormal; Notable for the following components:   Hgb A1c MFr Bld 5.9 (*)    All other components within normal limits  COMPREHENSIVE METABOLIC PANEL - Abnormal; Notable for the following components:   Potassium 3.4 (*)    Glucose, Bld 104 (*)    Creatinine, Ser 1.33 (*)    All other components within normal limits  RESPIRATORY  PANEL BY RT PCR (FLU A&B, COVID)  MRSA PCR SCREENING    CBC  HIV ANTIBODY (ROUTINE TESTING W REFLEX)  PROTIME-INR  BRAIN NATRIURETIC PEPTIDE  APTT  CBC WITH DIFFERENTIAL/PLATELET  TSH  MAGNESIUM  LIPASE, BLOOD  LIPID PANEL  CBC  TROPONIN I (HIGH SENSITIVITY)  TROPONIN I (HIGH SENSITIVITY)  TROPONIN I (HIGH SENSITIVITY)  TROPONIN I (HIGH SENSITIVITY)    EKG EKG Interpretation  Date/Time:  Monday February 14 2020 15:09:01 EST Ventricular Rate:  56 PR Interval:  224 QRS Duration: 120 QT Interval:  448 QTC Calculation: 432 R Axis:   -45 Text Interpretation: Minor ST elevations in I, aVL seen on prior tracings No sig change from prior ecg Consider clinical correlation Confirmed by Octaviano Glow [id number]) on 02/14/2020 4:28:31 PM   Radiology DG Knee 1-2 Views Left  Result Date: 02/15/2020 CLINICAL DATA:  Left knee pain. EXAM: LEFT KNEE - 1-2 VIEW COMPARISON:  None. FINDINGS: Moderate tricompartmental degenerative changes for age. There is joint space narrowing and early spurring change. No acute fracture, osteochondral lesion or chondrocalcinosis. No definite joint effusion. Benign-appearing sclerotic lesion in the tibial metaphysis. IMPRESSION: 1. Moderate tricompartmental degenerative changes for age. 2. No acute bony findings or joint effusion. Electronically Signed   By: Marijo Sanes M.D.   On: 02/15/2020 06:19   DG Abd 1 View  Result Date: 02/15/2020 CLINICAL DATA:  Abdominal pain. EXAM: ABDOMEN - 1 VIEW COMPARISON:  08/26/2018 FINDINGS: The abdominal bowel gas pattern is unremarkable. No findings for obstruction or perforation. No significant stool burden to suggest constipation. The soft tissue shadows of the abdomen are grossly maintained. No worrisome calcifications. The lung bases are grossly clear. The bony structures are intact. IMPRESSION: Unremarkable abdominal radiograph. Electronically Signed   By: Marijo Sanes M.D.   On: 02/15/2020 06:27   DG Chest Port 1 View  Result Date: 02/14/2020 CLINICAL DATA:  Chest pain for 1  day EXAM: PORTABLE CHEST 1 VIEW COMPARISON:  08/23/2019 FINDINGS: The heart size and mediastinal contours are within normal limits. Both lungs are clear. The visualized skeletal structures are unremarkable. IMPRESSION: No active disease. Electronically Signed   By: Randa Ngo M.D.   On: 02/14/2020 16:10    Procedures Procedures (including critical care time)  Medications Ordered in ED Medications  influenza vac split quadrivalent PF (FLUARIX) injection 0.5 mL (has no administration in time range)  amLODipine (NORVASC) tablet 5 mg (5 mg Oral Given 02/15/20 0909)  sotalol (BETAPACE) tablet 80 mg (80 mg Oral Given 02/15/20 0910)  enoxaparin (LOVENOX) injection 95 mg (95 mg Subcutaneous Given 02/15/20 0642)  aspirin EC tablet 81 mg (81 mg Oral Given 02/15/20 0909)  nitroGLYCERIN (NITROSTAT) SL tablet 0.4 mg (has no administration in time range)  acetaminophen (TYLENOL) tablet 650 mg (has no administration in time range)  ondansetron (ZOFRAN) injection 4 mg (has no administration in time range)  aspirin chewable tablet 324 mg (324 mg Oral Given 02/14/20 1556)  potassium chloride SA (KLOR-CON) CR tablet 40 mEq (40 mEq Oral Given 02/14/20 1839)  alum & mag hydroxide-simeth (MAALOX/MYLANTA) 200-200-20 MG/5ML suspension 30 mL (30 mLs Oral Given 02/14/20 1841)  ketorolac (TORADOL) 15 MG/ML injection 15 mg (15 mg Intravenous Given 02/14/20 2126)  aspirin 325 MG tablet (  Duplicate 29/9/37 1696)  aspirin 325 MG tablet (325 mg  Given 02/15/20 7893)    ED Course  I have reviewed the triage vital signs and the nursing notes.  Pertinent labs & imaging results that were available during  my care of the patient were reviewed by me and considered in my medical decision making (see chart for details).  This patient presents to the Emergency Department with complaint of chest pain. This involves an extensive number of treatment options, and is a complaint that carries with it a high risk of complications and  morbidity.  The differential diagnosis includes ACS vs Pneumothorax vs PE vs Reflux/Gastritis vs MSK pain vs Pneumonia vs other.  He does have several cardiac risk factors including strong family history of MI in his brothers.  His CP was also relieved with SL nitro, which raises some concern for angina.  However reflux remains on the differential, as he woke up with these symptoms, suffers from reflux, and is having persistent nausea.  I ordered, reviewed, and interpreted labs, including BMP and CBC (unremarkable except for mild hypoK at 3.1), trops flat at 8->7.  There were no immediate, life-threatening emergencies found in this labwork.  Covid/flu negative. I ordered medication aspirin 324 mg and SL nitro for chest pain, PO potassium for hypokalemia, GI cocktail and IV toradol for possible gastritis and HA from nitro I ordered imaging studies which included DG chest  I independently visualized and interpreted imaging which showed no acute process and the monitor tracing which showed NSR Previous records obtained and reviewed showing [facility] records regarding cardiac workup I personally reviewed the patients ECG as noted in ED course below  I consulted Dr Audie Box from cardiology and discussed lab and imaging findings.  We decided to admit the patient for further cardiac monitoring, possible CTCA tomorrow.  Note ECG is not crossing over - photo uploaded to media tab  Clinical Course as of Feb 14 946  [facility] Feb 14, 2020  [place] August 2021 TTE at [facility]:  The left ventricular size is normal.Mild left ventricular hypertrophy  Left ventricular systolic function is normal.  LV ejection fraction = 55-60%.  The left ventricular wall motion is normal.  Left ventricular filling pattern is prolonged relaxation.  The right ventricle is normal in size and function.  The left atrial size is normal.  There is no significant valvular stenosis or regurgitation.  There was insufficient TR detected to calculate  RV systolic pressure.  The aortic sinus is normal size.  IVC size was normal.  There is no pericardial effusion.  There is no significant change in comparison with the last study.     [MT]  9924 On review of prior ecg's, patient's minor ST elevation in leads I and aVL and T wave inversion in inferior-lateral leads have been seen on prior ecgs this year, including in Jan and May.  There are no acute changes seen.   [MT]  1629 Troponin I (High Sensitivity): 8 [MT]  1831 I spoke to [facility] team, who reports they have no available beds at this time.  I then spoke to Dr Audie Box our cardiologist, who agreed for a chest pain observation admission with the goal for a CT coronary angiogram tomorrow morning.  Patient updated and agreeable to stay.  His CP had improved after nitro, but now returns to 8/10 intensity.  His BP dropped a bit and he had a HA with nitro, and would prefer no more.  2nd trop pending.   [MT]    Clinical Course User Index [MT] Amandeep Hogston, Carola Rhine, MD    Final Clinical Impression(s) / ED Diagnoses Final diagnoses:  Chest pain  Abdominal pain  Abdominal pain    Rx / DC Orders ED  Discharge Orders    None       Wyvonnia Dusky, MD 02/15/20 [phone]

## 2020-02-14 NOTE — Plan of Care (Signed)
  Problem: Activity: Goal: Ability to tolerate increased activity will improve Outcome: Progressing   Problem: Cardiac: Goal: Ability to achieve and maintain adequate cardiovascular perfusion will improve Outcome: Progressing   

## 2020-02-14 NOTE — ED Notes (Signed)
Assumed care of patient at this time

## 2020-02-15 ENCOUNTER — Observation Stay (HOSPITAL_COMMUNITY): Payer: Medicare Other

## 2020-02-15 DIAGNOSIS — Z23 Encounter for immunization: Secondary | ICD-10-CM | POA: Diagnosis not present

## 2020-02-15 DIAGNOSIS — R079 Chest pain, unspecified: Secondary | ICD-10-CM

## 2020-02-15 DIAGNOSIS — R0789 Other chest pain: Secondary | ICD-10-CM | POA: Diagnosis not present

## 2020-02-15 DIAGNOSIS — I700 Atherosclerosis of aorta: Secondary | ICD-10-CM | POA: Diagnosis not present

## 2020-02-15 DIAGNOSIS — I480 Paroxysmal atrial fibrillation: Secondary | ICD-10-CM | POA: Diagnosis not present

## 2020-02-15 DIAGNOSIS — R072 Precordial pain: Secondary | ICD-10-CM

## 2020-02-15 DIAGNOSIS — Z20822 Contact with and (suspected) exposure to covid-19: Secondary | ICD-10-CM | POA: Diagnosis not present

## 2020-02-15 DIAGNOSIS — I77810 Thoracic aortic ectasia: Secondary | ICD-10-CM | POA: Diagnosis not present

## 2020-02-15 LAB — TROPONIN I (HIGH SENSITIVITY)
Troponin I (High Sensitivity): 7 ng/L (ref <18)
Troponin I (High Sensitivity): 6 ng/L (ref <18)

## 2020-02-15 LAB — CBC WITH DIFFERENTIAL/PLATELET
Abs Immature Granulocytes: 0.02 10*3/uL (ref 0.00–0.07)
Basophils Absolute: 0 10*3/uL (ref 0.0–0.1)
Basophils Relative: 1 %
Eosinophils Absolute: 0.4 10*3/uL (ref 0.0–0.5)
Eosinophils Relative: 6 %
HCT: 42.1 % (ref 39.0–52.0)
Hemoglobin: 14.1 g/dL (ref 13.0–17.0)
Immature Granulocytes: 0 %
Lymphocytes Relative: 27 %
Lymphs Abs: 1.7 10*3/uL (ref 0.7–4.0)
MCH: 29 pg (ref 26.0–34.0)
MCHC: 33.5 g/dL (ref 30.0–36.0)
MCV: 86.6 fL (ref 80.0–100.0)
Monocytes Absolute: 0.7 10*3/uL (ref 0.1–1.0)
Monocytes Relative: 11 %
Neutro Abs: 3.3 10*3/uL (ref 1.7–7.7)
Neutrophils Relative %: 55 %
Platelets: 210 10*3/uL (ref 150–400)
RBC: 4.86 MIL/uL (ref 4.22–5.81)
RDW: 12.9 % (ref 11.5–15.5)
WBC: 6.1 10*3/uL (ref 4.0–10.5)
nRBC: 0 % (ref 0.0–0.2)

## 2020-02-15 LAB — COMPREHENSIVE METABOLIC PANEL
ALT: 22 U/L (ref 0–44)
AST: 24 U/L (ref 15–41)
Albumin: 3.7 g/dL (ref 3.5–5.0)
Alkaline Phosphatase: 65 U/L (ref 38–126)
Anion gap: 11 (ref 5–15)
BUN: 16 mg/dL (ref 6–20)
CO2: 23 mmol/L (ref 22–32)
Calcium: 9.3 mg/dL (ref 8.9–10.3)
Chloride: 105 mmol/L (ref 98–111)
Creatinine, Ser: 1.33 mg/dL — ABNORMAL HIGH (ref 0.61–1.24)
GFR, Estimated: >60 mL/min (ref >60)
Glucose, Bld: 104 mg/dL — ABNORMAL HIGH (ref 70–99)
Potassium: 3.4 mmol/L — ABNORMAL LOW (ref 3.5–5.1)
Sodium: 139 mmol/L (ref 135–145)
Total Bilirubin: 1 mg/dL (ref 0.3–1.2)
Total Protein: 7.3 g/dL (ref 6.5–8.1)

## 2020-02-15 LAB — LIPID PANEL
Cholesterol: 158 mg/dL (ref 0–200)
HDL: 28 mg/dL — ABNORMAL LOW (ref >40)
LDL Cholesterol: 111 mg/dL — ABNORMAL HIGH (ref 0–99)
Total CHOL/HDL Ratio: 5.6 RATIO
Triglycerides: 95 mg/dL (ref <150)
VLDL: 19 mg/dL (ref 0–40)

## 2020-02-15 LAB — TSH: TSH: 0.744 u[IU]/mL (ref 0.350–4.500)

## 2020-02-15 LAB — MRSA PCR SCREENING: MRSA by PCR: NEGATIVE

## 2020-02-15 LAB — HEMOGLOBIN A1C
Hgb A1c MFr Bld: 5.9 % — ABNORMAL HIGH (ref 4.8–5.6)
Mean Plasma Glucose: 122.63 mg/dL

## 2020-02-15 LAB — LIPASE, BLOOD: Lipase: 26 U/L (ref 11–51)

## 2020-02-15 LAB — HIV ANTIBODY (ROUTINE TESTING W REFLEX): HIV Screen 4th Generation wRfx: NONREACTIVE

## 2020-02-15 LAB — PROTIME-INR
INR: 1.1 (ref 0.8–1.2)
Prothrombin Time: 13.8 seconds (ref 11.4–15.2)

## 2020-02-15 LAB — BRAIN NATRIURETIC PEPTIDE: B Natriuretic Peptide: 30.1 pg/mL (ref 0.0–100.0)

## 2020-02-15 LAB — APTT: aPTT: 34 seconds (ref 24–36)

## 2020-02-15 LAB — MAGNESIUM: Magnesium: 2.3 mg/dL (ref 1.7–2.4)

## 2020-02-15 MED ORDER — ASPIRIN 300 MG RE SUPP: 300.0000 mg | RECTAL | Status: DC

## 2020-02-15 MED ORDER — ASPIRIN 325 MG PO TABS
ORAL_TABLET | ORAL | Status: AC
  Administered 2020-02-15: 325 mg
  Filled 2020-02-15: qty 1

## 2020-02-15 MED ORDER — ATORVASTATIN CALCIUM 40 MG PO TABS: 40.0000 mg | ORAL_TABLET | Freq: Every day | ORAL | 11 refills | Status: DC

## 2020-02-15 MED ORDER — NITROGLYCERIN 0.4 MG SL SUBL: 0.4000 mg | SUBLINGUAL_TABLET | SUBLINGUAL | Status: DC | PRN

## 2020-02-15 MED ORDER — ACETAMINOPHEN 325 MG PO TABS
650.0000 mg | ORAL_TABLET | ORAL | Status: DC | PRN
  Administered 2020-02-15: 650 mg via ORAL
  Filled 2020-02-15: qty 2

## 2020-02-15 MED ORDER — POTASSIUM CHLORIDE CRYS ER 20 MEQ PO TBCR
40.0000 meq | EXTENDED_RELEASE_TABLET | ORAL | Status: AC
  Administered 2020-02-15 (×2): 40 meq via ORAL
  Filled 2020-02-15 (×2): qty 2

## 2020-02-15 MED ORDER — ONDANSETRON HCL 4 MG/2ML IJ SOLN: 4.0000 mg | Freq: Four times a day (QID) | INTRAMUSCULAR | Status: DC | PRN

## 2020-02-15 MED ORDER — SOTALOL HCL 80 MG PO TABS
80.0000 mg | ORAL_TABLET | Freq: Two times a day (BID) | ORAL | Status: DC
  Administered 2020-02-15: 80 mg via ORAL
  Filled 2020-02-15 (×2): qty 1

## 2020-02-15 MED ORDER — APIXABAN 5 MG PO TABS
5.0000 mg | ORAL_TABLET | Freq: Two times a day (BID) | ORAL | Status: DC
  Filled 2020-02-15: qty 1

## 2020-02-15 MED ORDER — AMLODIPINE BESYLATE 5 MG PO TABS
5.0000 mg | ORAL_TABLET | Freq: Every day | ORAL | Status: DC
  Administered 2020-02-15: 5 mg via ORAL
  Filled 2020-02-15: qty 1

## 2020-02-15 MED ORDER — ENOXAPARIN SODIUM 100 MG/ML ~~LOC~~ SOLN
1.0000 mg/kg | Freq: Two times a day (BID) | SUBCUTANEOUS | Status: DC
  Administered 2020-02-15: 95 mg via SUBCUTANEOUS
  Filled 2020-02-15: qty 0.95

## 2020-02-15 MED ORDER — NITROGLYCERIN 0.4 MG SL SUBL
SUBLINGUAL_TABLET | SUBLINGUAL | Status: AC
  Administered 2020-02-15: 0.8 mg
  Filled 2020-02-15: qty 2

## 2020-02-15 MED ORDER — ASPIRIN EC 81 MG PO TBEC
81.0000 mg | DELAYED_RELEASE_TABLET | Freq: Every day | ORAL | Status: DC
  Administered 2020-02-15: 81 mg via ORAL
  Filled 2020-02-15: qty 1

## 2020-02-15 MED ORDER — ASPIRIN 81 MG PO CHEW: 324.0000 mg | CHEWABLE_TABLET | ORAL | Status: DC

## 2020-02-15 MED ORDER — APIXABAN 5 MG PO TABS
5.0000 mg | ORAL_TABLET | Freq: Two times a day (BID) | ORAL | Status: DC
  Administered 2020-02-15: 5 mg via ORAL

## 2020-02-15 MED ORDER — ASPIRIN 325 MG PO TABS
ORAL_TABLET | ORAL | Status: AC
  Filled 2020-02-15: qty 1

## 2020-02-15 NOTE — Progress Notes (Signed)
ANTICOAGULATION CONSULT NOTE - Initial Consult  Pharmacy Consult for apixaban Indication: atrial fibrillation  No Known Allergies  Patient Measurements: Height: 5\' 11"  (180.3 cm) Weight: 94 kg (207 lb 3.7 oz) IBW/kg (Calculated) : 75.3   Vital Signs: Temp: 98.4 F (36.9 C) (11/09 1200) Temp Source: Oral (11/09 1200) BP: 108/71 (11/09 1117) Pulse Rate: 56 (11/09 1117)  Labs: Recent Labs    02/14/20 1514 02/14/20 1514 02/14/20 1938 02/15/20 0559 02/15/20 0815  HGB 15.2  --   --  14.1  --   HCT 45.0  --   --  42.1  --   PLT 219  --   --  210  --   APTT  --   --   --  34  --   LABPROT  --   --   --  13.8  --   INR  --   --   --  1.1  --   CREATININE 1.33*  --   --  1.33*  --   TROPONINIHS 8   < > 7 7 6    < > = values in this interval not displayed.    Estimated Creatinine Clearance: 69.2 mL/min (A) (by C-G formula based on SCr of 1.33 mg/dL (H)).   Medical History: Past Medical History:  Diagnosis Date  . A-fib ([place])   . Chest pain    a. 11/2002 Cath: EF 68%, LM nl, LAD small, nl, LCX nl, RCA tortuous/nl;  b. 08/2011 St Echo: EF 60%, normal contractility, no scar/ischemia.  Marland Kitchen COPD (chronic obstructive pulmonary disease) ([place])   . Enlarged prostate   . GERD (gastroesophageal reflux disease)   . GI bleeding 08/2018  . Hypertension   . Sickle cell trait ([place])   . Sleep apnea   . SVT (supraventricular tachycardia) (HCC)     Medications:  Medications Prior to Admission  Medication Sig Dispense Refill Last Dose  . acetaminophen (TYLENOL) 325 MG tablet Take 650 mg by mouth every 6 (six) hours as needed for moderate pain.   unk  . amLODipine (NORVASC) 5 MG tablet Take 1 tablet (5 mg total) by mouth daily. 30 tablet 0 02/15/2020 at Unknown time  . apixaban (ELIQUIS) 5 MG TABS tablet Take 1 tablet (5 mg total) by mouth 2 (two) times daily. Take Eliquis 5 mg twice a day one week after GI bleed.  Do not restart Xarelto. (Patient taking differently: Take 5 mg by mouth 2  (two) times daily. ) 60 tablet 0 02/14/2020 at 0930  . cetirizine (ZYRTEC) 10 MG tablet Take 10 mg by mouth daily as needed for allergies.    02/12/2020  . Colchicine 0.6 MG CAPS Take 0.6 mg by mouth 2 (two) times daily as needed. Gout flares   02/12/2020  . ferrous sulfate 325 (65 FE) MG tablet Take 1 tablet (325 mg total) by mouth daily with breakfast. 60 tablet 0 02/14/2020 at Unknown time  . fluticasone (FLONASE) 50 MCG/ACT nasal spray Place 1 spray into both nostrils daily as needed for allergies or rhinitis.   unk  . Fluticasone-Salmeterol (ADVAIR) 250-50 MCG/DOSE AEPB Inhale 1 puff into the lungs 2 (two) times daily as needed (wheezing).   unk  . LINZESS 290 MCG CAPS capsule Take 290 mcg by mouth every other day.   02/12/2020  . nitroGLYCERIN (NITROSTAT) 0.4 MG SL tablet Place 1 tablet (0.4 mg total) under the tongue every 5 (five) minutes as needed for chest pain. 20 tablet 1 unk  . ondansetron (  ZOFRAN) 8 MG tablet Take 8 mg by mouth every 8 (eight) hours as needed for nausea or vomiting.    unk  . oxyCODONE-acetaminophen (PERCOCET/ROXICET) 5-325 MG tablet Take 1 tablet by mouth every 6 (six) hours as needed for moderate pain or severe pain.    unk  . pantoprazole (PROTONIX) 40 MG tablet Take 40 mg by mouth daily.   02/14/2020 at Unknown time  . polyethylene glycol (MIRALAX / GLYCOLAX) 17 g packet Take 17 g by mouth daily. (Patient taking differently: Take 17 g by mouth daily as needed for mild constipation. ) 14 each 0 unk  . sildenafil (REVATIO) 20 MG tablet Take 60-100 mg by mouth as directed.    Past Month at Unknown time  . sotalol (BETAPACE) 80 MG tablet Take 1 tablet (80 mg total) by mouth every 12 (twelve) hours. 60 tablet 0 02/14/2020 at 0930  . SUMAtriptan (IMITREX) 100 MG tablet Take 100 mg by mouth as directed. Take one tablet at onset, then an additional tablet every 12 hours as needed for migraine   Past Week at Unknown time  . traMADol (ULTRAM) 50 MG tablet Take 50 mg by mouth every 6  (six) hours as needed for moderate pain.   unk at Unknown time  . triamterene-hydrochlorothiazide (DYAZIDE) 37.5-25 MG capsule Take 1 capsule by mouth every morning.   02/14/2020 at Unknown time  . sucralfate (CARAFATE) 1 g tablet Take 1 tablet (1 g total) by mouth 4 (four) times daily -  with meals and at bedtime. (Patient not taking: Reported on 02/15/2020) 30 tablet 0 Not Taking at Unknown time   Scheduled:  . amLODipine  5 mg Oral Daily  . apixaban  5 mg Oral BID  . aspirin EC  81 mg Oral Daily  . influenza vac split quadrivalent PF  0.5 mL Intramuscular Tomorrow-1000  . potassium chloride  40 mEq Oral Q4H  . sotalol  80 mg Oral Q12H    Assessment: 59 yo mae on apixaban PTA for afib. This has been on hold for possible procedure. A coronary CT was done and there are no plans for procedure.  Pharmacy asked to restart apixaban. He was on enoxaparin (last dose was 7am today). Plans noted for discharge -Hg= 14.1, SCr= 1.33    Goal of Therapy:  Monitor platelets by anticoagulation protocol: Yes   Plan: -apixaban 5mg  po bid (restart tonight)   Hildred Laser, PharmD Clinical Pharmacist **Pharmacist phone directory can now be found on [email or web address] ([email or web address]).  Listed under [place].

## 2020-02-15 NOTE — Progress Notes (Signed)
First dose of eliquis given.

## 2020-02-15 NOTE — Progress Notes (Signed)
Discharge instructions given to pt. Awaiting  ride home. Belongings with pt.

## 2020-02-15 NOTE — Progress Notes (Signed)
Brief cardiology progress note:  Admission note reviewed from this AM. hsTn unremarkable. Discussed plan for cardiac CT; heart rate is good in the 50s. Does get a headache from nitro, will pre-treat with tylenol. No prior issues with dye. Reviewed procedure, and he is amenable.   Buford Dresser, MD, PhD [facility]  [address] [place], [place] [address] [phone]

## 2020-02-15 NOTE — Discharge Instructions (Signed)
Heart Healthy Diet  We added Cholesterol medication to prevent any further coronary disease

## 2020-02-15 NOTE — H&P (Signed)
Cardiology History & Physical    Patient ID: [name] MRN: [id number], DOB/AGE: Aug 04, 1960   Admit date: 02/14/2020  Primary Physician: [name], MD Primary Cardiologist: Moss Mc, MD  Patient Profile    [name] is a 59 y.o. male with a hx of paroxysmal atrial fibrillation/atrial tachycardia s/p ablation 2018>> redo in 2021 with PVI plus CTI on sotalol and Eliquis, TIA, HTN, and COPD who presented to [facility] 02/14/20 with chest pain.   History of Present Illness    [name] is a 59yo M with a hx as stated above who presented to [facility] 02/14/20 with chest pain. Per chart review the patient called his cardiologists office today with complaints of chest pain with associated nausea and abdominal pain which began on the morning of presentation.   He reports that this morning when he woke up he felt a squeezing chest pain that was worsened with exertion.  He also felt radiation down in to his epigastrium.  While he has chest discomfort associated with his pAF and AT, this was different in its duration and intensity.  Additionally he has not felt the epigastric pain previous.  No pleuritic component to the chest pain.  Denies nausea, vomiting, diarrhea.  He is mildly constipated, last bowel movement two days ago.  He also does confirm some shortness of breath though this is not clearly exertional and more related to "not being able to catch a deep breath."  Finally, he reports some throbbing pain of his calf, knee, and thigh which has been intermittent but not constant and is not bothering him now.  He was transferred here for further evaluation.  He arrives without any accompanying paperwork or data.  He last took his sotalol and apixaban the morning of 02/14/20  ECG personally reviewed with NSR w/ LAFB and NSST changes unchanged from prior ECG in May of this year.  Serial hsTroponinI 8 and 7.  Past Medical History   Past Medical History:  Diagnosis  Date  . A-fib ([place])   . Chest pain    a. 11/2002 Cath: EF 68%, LM nl, LAD small, nl, LCX nl, RCA tortuous/nl;  b. 08/2011 St Echo: EF 60%, normal contractility, no scar/ischemia.  Marland Kitchen COPD (chronic obstructive pulmonary disease) ([place])   . Enlarged prostate   . GERD (gastroesophageal reflux disease)   . GI bleeding 08/2018  . Hypertension   . Sickle cell trait ([place])   . Sleep apnea   . SVT (supraventricular tachycardia) ([facility])     Past Surgical History:  Procedure Laterality Date  . ATRIAL FIBRILLATION ABLATION    . BIOPSY  08/27/2018   Procedure: BIOPSY;  Surgeon: Thornton Park, MD;  Location: [place];  Service: Gastroenterology;;  . BIOPSY  08/28/2018   Procedure: BIOPSY;  Surgeon: Thornton Park, MD;  Location: [place];  Service: Gastroenterology;;  . COLONOSCOPY WITH PROPOFOL N/A 08/28/2018   Procedure: COLONOSCOPY WITH PROPOFOL;  Surgeon: Thornton Park, MD;  Location: [place];  Service: Gastroenterology;  Laterality: N/A;  . ENTEROSCOPY N/A 08/27/2018   Procedure: ENTEROSCOPY;  Surgeon: Thornton Park, MD;  Location: [place];  Service: Gastroenterology;  Laterality: N/A;  . GIVENS CAPSULE STUDY N/A 08/28/2018   Procedure: GIVENS CAPSULE STUDY;  Surgeon: Thornton Park, MD;  Location: [facility];  Service: Gastroenterology;  Laterality: N/A;  . LEFT HEART CATHETERIZATION WITH CORONARY ANGIOGRAM N/A 03/11/2013   Procedure: LEFT HEART CATHETERIZATION WITH CORONARY ANGIOGRAM;  Surgeon: Jolaine Artist, MD;  Location: [facility]  CATH LAB;  Service: Cardiovascular;  Laterality: N/A;  . POLYPECTOMY  08/28/2018   Procedure: POLYPECTOMY;  Surgeon: Thornton Park, MD;  Location: [place];  Service: Gastroenterology;;  . TONSILLECTOMY    . TRANSURETHRAL RESECTION OF PROSTATE       Allergies No Known Allergies  Home Medications    Prior to Admission medications   Medication Sig Start Date End Date Taking? Authorizing Provider  apixaban (ELIQUIS) 5 MG TABS  tablet Take 1 tablet (5 mg total) by mouth 2 (two) times daily. Take Eliquis 5 mg twice a day one week after GI bleed.  Do not restart Xarelto. 05/06/19  Yes Hall, Carole N, DO  sildenafil (REVATIO) 20 MG tablet TAKE 3-5 TABLETS BY MOUTH AS DIRECTED 11/25/18  Yes [provider]  traMADol (ULTRAM) 50 MG tablet Take 50 mg by mouth every 6 (six) hours as needed for moderate pain.   Yes [provider]  acetaminophen (TYLENOL) 325 MG tablet Take 650 mg by mouth every 6 (six) hours as needed for moderate pain.    [provider]  amLODipine (NORVASC) 5 MG tablet Take 1 tablet (5 mg total) by mouth daily. 04/30/19 05/30/19  Kayleen Memos, DO  cetirizine (ZYRTEC) 10 MG tablet Take 10 mg by mouth daily as needed for allergies.     [provider]  ferrous sulfate 325 (65 FE) MG tablet Take 1 tablet (325 mg total) by mouth daily with breakfast. 04/30/19 06/29/19  Kayleen Memos, DO  fluticasone (FLONASE) 50 MCG/ACT nasal spray Place 1 spray into both nostrils daily as needed for allergies or rhinitis.    [provider]  Fluticasone-Salmeterol (ADVAIR) 250-50 MCG/DOSE AEPB Inhale 1 puff into the lungs 2 (two) times daily as needed (wheezing).    [provider]  nitroGLYCERIN (NITROSTAT) 0.4 MG SL tablet Place 1 tablet (0.4 mg total) under the tongue every 5 (five) minutes as needed for chest pain. 03/12/13   Thurnell Lose, MD  ondansetron (ZOFRAN) 8 MG tablet Take 8 mg by mouth every 8 (eight) hours as needed for nausea or vomiting.  10/20/18   [provider]  pantoprazole (PROTONIX) 40 MG tablet Take 40 mg by mouth daily. 07/28/14   [provider]  polyethylene glycol (MIRALAX / GLYCOLAX) 17 g packet Take 17 g by mouth daily. 04/30/19   Kayleen Memos, DO  sotalol (BETAPACE) 80 MG tablet Take 1 tablet (80 mg total) by mouth every 12 (twelve) hours. 04/29/19 05/29/19  Kayleen Memos, DO  sucralfate (CARAFATE) 1 g tablet Take 1 tablet (1 g  total) by mouth 4 (four) times daily -  with meals and at bedtime. 08/24/19   Rancour, Annie Main, MD  gabapentin (NEURONTIN) 300 MG capsule Take 300 mg by mouth at bedtime.  02/26/19  [provider]    Family History    Family History  Problem Relation Age of Onset  . CAD Mother        died in her 31's - MI  . Multiple myeloma Brother   . Sickle cell anemia Father        died @ 87  . Gastric cancer Neg Hx    He indicated that the status of his mother is unknown. He indicated that the status of his father is unknown. He indicated that the status of his brother is unknown. He indicated that the status of his neg hx is unknown.   Social History    Social History  Socioeconomic History  . Marital status: Divorced    Spouse name: Not on file  . Number of children: Not on file  . Years of education: Not on file  . Highest education level: Not on file  Occupational History  . Not on file  Tobacco Use  . Smoking status: Never Smoker  . Smokeless tobacco: Never Used  Vaping Use  . Vaping Use: Never used  Substance and Sexual Activity  . Alcohol use: No  . Drug use: Never  . Sexual activity: Not on file  Other Topics Concern  . Not on file  Social History Narrative   Lives in [place] with dtr.   Social Determinants of Health   Financial Resource Strain:   . Difficulty of Paying Living Expenses: Not on file  Food Insecurity:   . Worried About Charity fundraiser in the Last Year: Not on file  . Ran Out of Food in the Last Year: Not on file  Transportation Needs:   . Lack of Transportation (Medical): Not on file  . Lack of Transportation (Non-Medical): Not on file  Physical Activity:   . Days of Exercise per Week: Not on file  . Minutes of Exercise per Session: Not on file  Stress:   . Feeling of Stress : Not on file  Social Connections:   . Frequency of Communication with Friends and Family: Not on file  . Frequency of Social Gatherings with Friends and Family:  Not on file  . Attends Religious Services: Not on file  . Active Member of Clubs or Organizations: Not on file  . Attends Archivist Meetings: Not on file  . Marital Status: Not on file  Intimate Partner Violence:   . Fear of Current or Ex-Partner: Not on file  . Emotionally Abused: Not on file  . Physically Abused: Not on file  . Sexually Abused: Not on file     Review of Systems    General:  No chills, fever, night sweats or weight changes.  Cardiovascular:  No chest pain, dyspnea on exertion, edema, orthopnea, palpitations, paroxysmal nocturnal dyspnea. Dermatological: No rash, lesions/masses Respiratory: No cough, dyspnea Urologic: No hematuria, dysuria Abdominal:   No nausea, vomiting, diarrhea, bright red blood per rectum, melena, or hematemesis Neurologic:  No visual changes, wkns, changes in mental status. All other systems reviewed and are otherwise negative except as noted above.  Physical Exam    BP (!) 125/93 (BP Location: Right Arm)   Pulse (!) 55   Temp 98.3 F (36.8 C) (Oral)   Resp 14   Ht _0  (1.803 m)   Wt 94 kg   SpO2 95%   BMI 28.90 kg/m  General: Alert, NAD HEENT: Normal  Neck: No bruits or JVD. Lungs:  Resp regular and unlabored, CTA bilaterally. Heart: Regular rhythm, no s3, s4, or murmurs. Abdomen: Soft, non-tender, non-distended, BS +.  Extremities: Warm. No clubbing, cyanosis or edema. DP/PT/Radials 2+ and equal bilaterally. Psych: Normal affect. Neuro: Alert and oriented. No gross focal deficits. No abnormal movements.  Labs    Troponin (Point of Care Test) No results for input(s): TROPIPOC in the last 72 hours. No results for input(s): CKTOTAL, CKMB, TROPONINI in the last 72 hours. Lab Results  Component Value Date   WBC 6.5 02/14/2020   HGB 15.2 02/14/2020   HCT 45.0 02/14/2020   MCV 85.4 02/14/2020   PLT 219 02/14/2020    Recent Labs  Lab 02/14/20 1514  NA 141  K  3.1*  CL 104  CO2 26  BUN 15  CREATININE 1.33*   CALCIUM 9.8  GLUCOSE 86   No results found for: CHOL, HDL, LDLCALC, TRIG Lab Results  Component Value Date   DDIMER <0.27 05/19/2016     Radiology Studies    DG Chest Port 1 View  Result Date: 02/14/2020 CLINICAL DATA:  Chest pain for 1 day EXAM: PORTABLE CHEST 1 VIEW COMPARISON:  08/23/2019 FINDINGS: The heart size and mediastinal contours are within normal limits. Both lungs are clear. The visualized skeletal structures are unremarkable. IMPRESSION: No active disease. Electronically Signed   By: Randa Ngo M.D.   On: 02/14/2020 16:10    ECG & Cardiac Imaging   Recent workup [facility], largely w/ Dr. Lamar Blinks with plan for PVI/CTI  TTE [facility] 11/26/19 LV 55-60% with normal motion, RV normal size & function, normal valves  08/11/19 PVI/CTI w/o catheter induced ventricular arrhythmia on EPS  07/22/19 cMRI w/ w/o contrast w/ small LV w/ concentric lCH, septum 16 mm max, normal function, LGE of  RV insertion suggesting PH, normal atrial size.  Vasodilator MPI 07/06/19 low risk without inducible abnormality or infarct.  Assessment & Plan    [name] is a 59 y.o. male with a hx of paroxysmal atrial fibrillation/atrial tachycardia s/p ablation 2018>> redo in 2021 with PVI plus CTI on sotalol and Eliquis, TIA, HTN, and COPD who presented to [facility] 02/14/20 with chest pain and mild associated abdominal pain transferred to our institution for further evaluation.  ECG unchanged from prior, no ongoing chest pain at time of my evaluation, and hsTroponin below threshold x2.  Altogether chest pain sounds possibly cardiac with prior reassuring non-invasive evaluation and no significant CAD on prior invasive angiogram ~ 20 years ago.  Favor non-invasive anatomic evaluation with to definitively evaluate coronaries.  If negative, can consider wearable monitor to exclude occult arrhythmia, though reports different symptoms, and remains within blanking period (~3 months from recent  ablation).  QTc unchanged on sotalol with stable renal function and will continue.  Will bridge apixaban with LMWH in the event that CTA reveals obstructive disease necessitating invasive approach.  Problem list Chest pain Abdominal pain Knee pain PAF/AT (left sided?) s/p PVI/CTI on sotalol and apixban HTN  Plan #Chest pain - ASA, lovenox - Monitor on telemetry - EKG, nitroglycerin PRN for repeat chest pain - Coronary CTA in AM - If recurrence, correlate with telemetry; can consider wearable monitor at discharge. - No clear pleuritic component favor low probability pulmonary embolism on apixaban.  #Abdominal pain (resolving, benign exam) - KUB, check lipase  #Knee/thigh pain.  Benign exam, low prob DVT with palpable distal pulses. - Knee x-ray  #pAF/AT s/p PVI/CTI on sotalol and apixaban - Continue sotalol 80 mg BID - Bridge DOAC w/ lovenox in event of invasive approach  #HTN - Continue home amlodipine 5 mg daily  Malnutrition: None Nutrition: Regular diet DVT ppx: Therapeutic anticoagulation GI ppx: None indicated Advanced Care Planning: Full code  Signed, Delight Hoh, MD 02/15/2020, 3:24 AM

## 2020-02-15 NOTE — Discharge Summary (Signed)
Discharge Summary    Patient ID: [name] MRN: [id number]; DOB: 1960/08/19  Admit date: 02/14/2020 Discharge date: 02/15/2020  Primary Care Provider: [name], MD  Primary Cardiologist: Moss Mc, MD  Primary Electrophysiologist:  None   Discharge Diagnoses    Principal Problem:   Chest pain- atypical. Normal LHC 03/2013 Active Problems:   Abdominal pain   PAF (paroxysmal atrial fibrillation) ([facility])   Anticoagulated    Diagnostic Studies/Procedures    Cardiac CTA with minimal nonobstructive CAD, coronary calcium score of 50  Over read by radiologist pending  _____________   History of Present Illness     [name] is a 59 y.o. male with a hx of paroxysmal atrial fibrillation/atrial tachycardias/p ablation2018>> redo in 2021 with PVI plus CTI on sotalol and Eliquis, TIA, HTN, and COPD who presented to [facility] 02/14/20 with chest pain.  EKG with SR with LAFB and NSST changes unchanged from 08/2019.  Hs troponin 8 and 7.  Recent echo at [facility] 11/2019 with EF 55-60%, RV normal and normal valves.  He presented 02/14/20 with chest pain and mild associated abd pain Qtc was unchanged on EKG on sotalol.  His eliquis was stopped and pt placed on lovenox.  He was admitted for further eval.    Hospital Course     Consultants: none   Today pt underwent cardiac CTA and very mild CAD.  All reassuring.  His abd pain resolved.  He maintained SR on sotalol and eliquis.  He remained on amlodipine for HTN.     Today he was seen and evaluated by Dr. Harrell Gave and with stable CAD he is found stable for discharge.  She discussed results with him.   He also had knee pain so xray done moderate tricompartmental degenerative changes for age. No acute bony findings or joint effusion.  For abd pain  abd xray done with acute findings.  Lipase was normal. He will follow up with PCP for both.  He will follow up with his cardiologist for any cardiac issues.   Did  the patient have an acute coronary syndrome (MI, NSTEMI, STEMI, etc) this admission?:  No                               Did the patient have a percutaneous coronary intervention (stent / angioplasty)?:  No.   _____________  Discharge Vitals Blood pressure 108/71, pulse (!) 56, temperature 98.4 F (36.9 C), temperature source Oral, resp. rate 18, height 5\' 11"  (1.803 m), weight 94 kg, SpO2 94 %.  Filed Weights   02/14/20 1512 02/14/20 1536 02/14/20 2243  Weight: 93.4 kg 95.3 kg 94 kg    Labs & Radiologic Studies    CBC Recent Labs    02/14/20 1514 02/15/20 0559  WBC 6.5 6.1  NEUTROABS  --  3.3  HGB 15.2 14.1  HCT 45.0 42.1  MCV 85.4 86.6  PLT 219 854   Basic Metabolic Panel Recent Labs    02/14/20 1514 02/15/20 0559  NA 141 139  K 3.1* 3.4*  CL 104 105  CO2 26 23  GLUCOSE 86 104*  BUN 15 16  CREATININE 1.33* 1.33*  CALCIUM 9.8 9.3  MG  --  2.3   Liver Function Tests Recent Labs    02/15/20 0559  AST 24  ALT 22  ALKPHOS 65  BILITOT 1.0  PROT 7.3  ALBUMIN 3.7   Recent Labs  02/15/20 0559  LIPASE 26   High Sensitivity Troponin:   Recent Labs  Lab 02/14/20 1514 02/14/20 1938 02/15/20 0559 02/15/20 0815  TROPONINIHS 8 7 7 6     BNP Invalid input(s): POCBNP D-Dimer No results for input(s): DDIMER in the last 72 hours. Hemoglobin A1C Recent Labs    02/15/20 0559  HGBA1C 5.9*   Fasting Lipid Panel Recent Labs    02/15/20 0559  CHOL 158  HDL 28*  LDLCALC 111*  TRIG 95  CHOLHDL 5.6   Thyroid Function Tests Recent Labs    02/15/20 0559  TSH 0.744   _____________  DG Knee 1-2 Views Left  Result Date: 02/15/2020 CLINICAL DATA:  Left knee pain. EXAM: LEFT KNEE - 1-2 VIEW COMPARISON:  None. FINDINGS: Moderate tricompartmental degenerative changes for age. There is joint space narrowing and early spurring change. No acute fracture, osteochondral lesion or chondrocalcinosis. No definite joint effusion. Benign-appearing sclerotic lesion in  the tibial metaphysis. IMPRESSION: 1. Moderate tricompartmental degenerative changes for age. 2. No acute bony findings or joint effusion. Electronically Signed   By: Marijo Sanes M.D.   On: 02/15/2020 06:19   DG Abd 1 View  Result Date: 02/15/2020 CLINICAL DATA:  Abdominal pain. EXAM: ABDOMEN - 1 VIEW COMPARISON:  08/26/2018 FINDINGS: The abdominal bowel gas pattern is unremarkable. No findings for obstruction or perforation. No significant stool burden to suggest constipation. The soft tissue shadows of the abdomen are grossly maintained. No worrisome calcifications. The lung bases are grossly clear. The bony structures are intact. IMPRESSION: Unremarkable abdominal radiograph. Electronically Signed   By: Marijo Sanes M.D.   On: 02/15/2020 06:27   CT CORONARY MORPH W/CTA COR W/SCORE W/CA W/CM &/OR WO/CM  Result Date: 02/15/2020 HISTORY: Chest pain/anginal equiv, ECGs and troponins normal EXAM: Cardiac/Coronary CT TECHNIQUE: The patient was scanned on a [employer]. PROTOCOL: A 110 kV prospective scan was triggered in the descending thoracic aorta at 111 HU's. Axial non-contrast 3 mm slices were carried out through the heart. The data set was analyzed on a dedicated work station and scored using the [place]. Gantry rotation speed was 250 msecs and collimation was 0.6 mm. Beta blockade and 0.8 mg of sl NTG was given. The 3D data set was reconstructed in 5% intervals of 35-75% of the R-R cycle. Diastolic phases were analyzed on a dedicated work station using MPR, MIP and VRT modes. The patient received of contrast. FINDINGS: Coronary calcium score: The patient's coronary artery calcium score is 50, which places the patient in the 32 percentile. Coronary arteries: Normal coronary origins.  Right dominance. Right Coronary Artery: Normal caliber vessel, gives rise to PDA. No significant plaque or stenosis. Left Main Coronary Artery: Normal caliber vessel. There is poor image quality in the  left main due to cardiac motion seen in all phases. There is no definitive plaque or stenosis seen. Left Anterior Descending Coronary Artery: Normal caliber vessel. Trivial calcified plaque with 1-24% stenosis. There is motion artifact in the mid LAD. Gives rise to 2 diagonal branches. Left Circumflex Artery: Normal caliber vessel. No significant plaque or stenosis. Gives rise to 2 OM branches. Aorta: Normal size, 37 mm at the mid ascending aorta (level of the PA bifurcation) measured double oblique. Trivial scattered calcifications. No dissection. Aortic Valve: No calcifications. Trileaflet. Other findings: Normal pulmonary vein drainage into the left atrium. Normal left atrial appendage without a thrombus. Normal size of the pulmonary artery. IMPRESSION: 1.  Minimal nonobstructive CAD, CADRADS = 1. 2. Coronary  calcium score of 50. This was 32nd percentile for age and sex matched control. 3. Normal coronary origin with right dominance. 4. Limitations to portions of the study due to cardiac motion and artifact, but no definitive obstructive disease noted. See limitations in the findings. Electronically Signed   By: Buford Dresser M.D.   On: 02/15/2020 11:28   DG Chest Port 1 View  Result Date: 02/14/2020 CLINICAL DATA:  Chest pain for 1 day EXAM: PORTABLE CHEST 1 VIEW COMPARISON:  08/23/2019 FINDINGS: The heart size and mediastinal contours are within normal limits. Both lungs are clear. The visualized skeletal structures are unremarkable. IMPRESSION: No active disease. Electronically Signed   By: Randa Ngo M.D.   On: 02/14/2020 16:10   Disposition   Pt is being discharged home today in good condition.  Follow-up Plans & Appointments   Heart Healthy Diet  We added Cholesterol medication to prevent any further coronary disease    Follow-up Information    Buford Dresser, MD Follow up on 02/28/2020.   Specialty: Cardiology Why: at 8:20 AM  Contact information: [address] [place] [address] [phone]        [name], MD. Schedule an appointment as soon as possible for a visit in 1 week(s).   Specialty: Family Medicine Why: call if returning abd pain or knee pain  Contact information: [place] [address] [phone]        Moss Mc, MD .   Specialty: Cardiology Contact information: [place]  [address] [phone]                Discharge Medications   Allergies as of 02/15/2020   No Known Allergies     Medication List    TAKE these medications   acetaminophen 325 MG tablet Commonly known as: TYLENOL Take 650 mg by mouth every 6 (six) hours as needed for moderate pain.   amLODipine 5 MG tablet Commonly known as: NORVASC Take 1 tablet (5 mg total) by mouth daily.   apixaban 5 MG Tabs tablet Commonly known as: Eliquis Take 1 tablet (5 mg total) by mouth 2 (two) times daily. Take Eliquis 5 mg twice a day one week after GI bleed.  Do not restart Xarelto. What changed: additional instructions   atorvastatin 40 MG tablet Commonly known as: Lipitor Take 1 tablet (40 mg total) by mouth daily.   cetirizine 10 MG tablet Commonly known as: ZYRTEC Take 10 mg by mouth daily as needed for allergies.   Colchicine 0.6 MG Caps Take 0.6 mg by mouth 2 (two) times daily as needed. Gout flares   ferrous sulfate 325 (65 FE) MG tablet Take 1 tablet (325 mg total) by mouth daily with breakfast.   fluticasone 50 MCG/ACT nasal spray Commonly known as: FLONASE Place 1 spray into both nostrils daily as needed for allergies or rhinitis.   Fluticasone-Salmeterol 250-50 MCG/DOSE Aepb Commonly known as: ADVAIR Inhale 1 puff into the lungs 2 (two) times daily as needed (wheezing).   Linzess 290 MCG Caps capsule Generic drug: linaclotide Take 290 mcg by mouth every other day.   nitroGLYCERIN 0.4 MG SL tablet Commonly known as: NITROSTAT Place  1 tablet (0.4 mg total) under the tongue every 5 (five) minutes as needed for chest pain.   ondansetron 8 MG tablet Commonly known as: ZOFRAN Take 8 mg by mouth every 8 (eight) hours as needed for nausea or vomiting.  oxyCODONE-acetaminophen 5-325 MG tablet Commonly known as: PERCOCET/ROXICET Take 1 tablet by mouth every 6 (six) hours as needed for moderate pain or severe pain.   pantoprazole 40 MG tablet Commonly known as: PROTONIX Take 40 mg by mouth daily.   polyethylene glycol 17 g packet Commonly known as: MIRALAX / GLYCOLAX Take 17 g by mouth daily. What changed:   when to take this  reasons to take this   sildenafil 20 MG tablet Commonly known as: REVATIO Take 60-100 mg by mouth as directed.   sotalol 80 MG tablet Commonly known as: BETAPACE Take 1 tablet (80 mg total) by mouth every 12 (twelve) hours.   sucralfate 1 g tablet Commonly known as: Carafate Take 1 tablet (1 g total) by mouth 4 (four) times daily -  with meals and at bedtime.   SUMAtriptan 100 MG tablet Commonly known as: IMITREX Take 100 mg by mouth as directed. Take one tablet at onset, then an additional tablet every 12 hours as needed for migraine   traMADol 50 MG tablet Commonly known as: ULTRAM Take 50 mg by mouth every 6 (six) hours as needed for moderate pain.   triamterene-hydrochlorothiazide 37.5-25 MG capsule Commonly known as: DYAZIDE Take 1 capsule by mouth every morning.          Outstanding Labs/Studies   Hepatic and lipid in 6-8 weeks   Duration of Discharge Encounter   Greater than 30 minutes including physician time.  Signed, Cecilie Kicks, NP 02/15/2020, 12:20 PM

## 2020-02-18 ENCOUNTER — Telehealth: Payer: Self-pay | Admitting: Nurse Practitioner

## 2020-02-18 NOTE — Telephone Encounter (Signed)
   Pt called this evening due to ongoing c/p.  He was recently hospitalized w/ the same w/ neg HsTrops and minimal nonobs CAD on cor CTA, no evidence of Ao dissection (ectasia noted - 4cm in diam), and unremarkable abd films.  Reassurance offered.  He will try OTC analgesics.  I advised that if pain is unrelenting, he may require ED eval for other causes of noncardiac c/p.  Caller verbalized understanding and was grateful for the call back.  Murray Hodgkins, NP 02/18/2020, 5:26 PM

## 2020-02-28 ENCOUNTER — Other Ambulatory Visit: Payer: Self-pay

## 2020-02-28 ENCOUNTER — Encounter: Payer: Self-pay | Admitting: Cardiology

## 2020-02-28 ENCOUNTER — Ambulatory Visit: Payer: Medicare Other | Admitting: Cardiology

## 2020-02-28 VITALS — BP 116/80 | HR 58 | Ht 71.0 in | Wt 210.0 lb

## 2020-02-28 DIAGNOSIS — I480 Paroxysmal atrial fibrillation: Secondary | ICD-10-CM | POA: Diagnosis not present

## 2020-02-28 DIAGNOSIS — I10 Essential (primary) hypertension: Secondary | ICD-10-CM | POA: Diagnosis not present

## 2020-02-28 DIAGNOSIS — I2510 Atherosclerotic heart disease of native coronary artery without angina pectoris: Secondary | ICD-10-CM | POA: Diagnosis not present

## 2020-02-28 DIAGNOSIS — R079 Chest pain, unspecified: Secondary | ICD-10-CM | POA: Diagnosis not present

## 2020-02-28 DIAGNOSIS — Z7189 Other specified counseling: Secondary | ICD-10-CM

## 2020-02-28 DIAGNOSIS — E7800 Pure hypercholesterolemia, unspecified: Secondary | ICD-10-CM

## 2020-02-28 NOTE — Patient Instructions (Signed)

## 2020-02-28 NOTE — Progress Notes (Signed)
Cardiology Office Note:    Date:  02/28/2020   ID:  [name], DOB 1960/11/12, MRN [id number]  PCP:  Bobby Rasmussen, MD  Cardiologist:  Bobby Mc, MD  Referring MD: Bobby Rasmussen, MD   Chief Complaint  Patient presents with  . Follow-up    Post hospital.  . Headache  . Shortness of Breath  . Edema  . Chest Pain    Last night.    History of Present Illness:    [name] is a 59 y.o. male with a hx of paroxysmal atrial fibrillation/atrial tachycardia s/p ablation (followed at [facility]), TIA, hypertension, COPD who is seen for hospital follow up today. I met him during his admission 02/15/20 for chest pain. 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   Today: Continues to feel exhausted with minimal exertion. Washing dishes, playing with grandson causes him to feel pain and fatigue. Doesn't feel that his heart rate is up or racing during these events; he can usually tell when his rhythm is not sinus.   Chest pain is like a squeezing in the center of his chest. Nothing makes it better or worse except time. Not clearly positional, not always exertional, not related to food.   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 Has been told he has other nerve damage that cannot be fixed. He is supposed to be taking gabapentin every night but does not do this routinely. He wants to minimize medications.  We reviewed the results of his CTA at length. He is very concerned about the continued pain. We spent extensive time today discussing potential causes of chest pain.   We discussed the indication for statin, has nonobstructive CAD, discussed guidelines for this.  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  ROS positive for chest tightness as above, constant shortness of breath (awaiting pulmonary referral), fatigue, nerve pain.   Interested in complementary and  alternative medicine for his issues.  Past Medical History:  Diagnosis Date  . A-fib ([place])   . Chest pain    a. 11/2002 Cath: EF 68%, LM nl, LAD small, nl, LCX nl, RCA tortuous/nl;  b. 08/2011 St Echo: EF 60%, normal contractility, no scar/ischemia.  Marland Kitchen COPD (chronic obstructive pulmonary disease) ([place])   . Enlarged prostate   . GERD (gastroesophageal reflux disease)   . GI bleeding 08/2018  . Hypertension   . Sickle cell trait ([place])   . Sleep apnea   . SVT (supraventricular tachycardia) ([facility])     Past Surgical History:  Procedure Laterality Date  . ATRIAL FIBRILLATION ABLATION    . BIOPSY  08/27/2018   Procedure: BIOPSY;  Surgeon: Thornton Park, MD;  Location: [place];  Service: Gastroenterology;;  . BIOPSY  08/28/2018   Procedure: BIOPSY;  Surgeon: Thornton Park, MD;  Location: [place];  Service: Gastroenterology;;  . COLONOSCOPY WITH PROPOFOL N/A 08/28/2018   Procedure: COLONOSCOPY WITH PROPOFOL;  Surgeon: Thornton Park, MD;  Location: [place];  Service: Gastroenterology;  Laterality: N/A;  . ENTEROSCOPY N/A 08/27/2018   Procedure: ENTEROSCOPY;  Surgeon: Thornton Park, MD;  Location: [place];  Service: Gastroenterology;  Laterality: N/A;  . GIVENS CAPSULE STUDY N/A 08/28/2018   Procedure: GIVENS CAPSULE STUDY;  Surgeon: Thornton Park, MD;  Location: [place];  Service: Gastroenterology;  Laterality: N/A;  . LEFT HEART CATHETERIZATION WITH CORONARY ANGIOGRAM N/A 03/11/2013   Procedure: LEFT  HEART CATHETERIZATION WITH CORONARY ANGIOGRAM;  Surgeon: Jolaine Artist, MD;  Location: [facility] CATH [facility];  Service: Cardiovascular;  Laterality: N/A;  . POLYPECTOMY  08/28/2018   Procedure: POLYPECTOMY;  Surgeon: Thornton Park, MD;  Location: [place];  Service: Gastroenterology;;  . TONSILLECTOMY    . TRANSURETHRAL RESECTION OF PROSTATE      Current Medications: Current Outpatient Medications on File Prior to Visit  Medication Sig  .  acetaminophen (TYLENOL) 325 MG tablet Take 650 mg by mouth every 6 (six) hours as needed for moderate pain.  Marland Kitchen apixaban (ELIQUIS) 5 MG TABS tablet Take 1 tablet (5 mg total) by mouth 2 (two) times daily. Take Eliquis 5 mg twice a day one week after GI bleed.  Do not restart Xarelto. (Patient taking differently: Take 5 mg by mouth 2 (two) times daily. )  . atorvastatin (LIPITOR) 40 MG tablet Take 1 tablet (40 mg total) by mouth daily.  . cetirizine (ZYRTEC) 10 MG tablet Take 10 mg by mouth daily as needed for allergies.   . Colchicine 0.6 MG CAPS Take 0.6 mg by mouth 2 (two) times daily as needed. Gout flares  . fluticasone (FLONASE) 50 MCG/ACT nasal spray Place 1 spray into both nostrils daily as needed for allergies or rhinitis.  . Fluticasone-Salmeterol (ADVAIR) 250-50 MCG/DOSE AEPB Inhale 1 puff into the lungs 2 (two) times daily as needed (wheezing).  Marland Kitchen LINZESS 290 MCG CAPS capsule Take 290 mcg by mouth every other day.  . nitroGLYCERIN (NITROSTAT) 0.4 MG SL tablet Place 1 tablet (0.4 mg total) under the tongue every 5 (five) minutes as needed for chest pain.  Marland Kitchen ondansetron (ZOFRAN) 8 MG tablet Take 8 mg by mouth every 8 (eight) hours as needed for nausea or vomiting.   Marland Kitchen oxyCODONE-acetaminophen (PERCOCET/ROXICET) 5-325 MG tablet Take 1 tablet by mouth every 6 (six) hours as needed for moderate pain or severe pain.   . pantoprazole (PROTONIX) 40 MG tablet Take 40 mg by mouth daily.  . polyethylene glycol (MIRALAX / GLYCOLAX) 17 g packet Take 17 g by mouth daily. (Patient taking differently: Take 17 g by mouth daily as needed for mild constipation. )  . sildenafil (REVATIO) 20 MG tablet Take 60-100 mg by mouth as directed.   . SUMAtriptan (IMITREX) 100 MG tablet Take 100 mg by mouth as directed. Take one tablet at onset, then an additional tablet every 12 hours as needed for migraine  . traMADol (ULTRAM) 50 MG tablet Take 50 mg by mouth every 6 (six) hours as needed for moderate pain.  Marland Kitchen  triamterene-hydrochlorothiazide (DYAZIDE) 37.5-25 MG capsule Take 1 capsule by mouth every morning.  Marland Kitchen amLODipine (NORVASC) 5 MG tablet Take 1 tablet (5 mg total) by mouth daily.  . ferrous sulfate 325 (65 FE) MG tablet Take 1 tablet (325 mg total) by mouth daily with breakfast.  . sotalol (BETAPACE) 80 MG tablet Take 1 tablet (80 mg total) by mouth every 12 (twelve) hours.  . [DISCONTINUED] gabapentin (NEURONTIN) 300 MG capsule Take 300 mg by mouth at bedtime.   No current facility-administered medications on file prior to visit.     Allergies:   Patient has no known allergies.   Social History   Tobacco Use  . Smoking status: Never Smoker  . Smokeless tobacco: Never Used  Vaping Use  . Vaping Use: Never used  Substance Use Topics  . Alcohol use: No  . Drug use: Never    Family History: family history includes CAD in his mother;  Multiple myeloma in his brother; Sickle cell anemia in his father. There is no history of Gastric cancer.  ROS:   Please see the history of present illness.  Additional pertinent ROS: Constitutional: Negative for chills, fever, night sweats, unintentional weight loss  HENT: Negative for ear pain and hearing loss.   Eyes: Negative for loss of vision and eye pain.  Respiratory: Negative for cough, sputum, wheezing.   Cardiovascular: See HPI. Gastrointestinal: Negative for abdominal pain, melena, and hematochezia.  Genitourinary: Negative for dysuria and hematuria.  Musculoskeletal: Negative for falls and myalgias.  Skin: Negative for itching and rash.  Neurological: Negative for focal weakness, focal sensory changes and loss of consciousness.  Endo/Heme/Allergies: Does not bruise/bleed easily.     EKGs/Labs/Other Studies Reviewed:    The following studies were reviewed today: [facility], largely w/ Dr. Lamar Blinks with plan for PVI/CTI  TTE [facility] 11/26/19 LV 55-60% with normal motion, RV normal size & function, normal valves  08/11/19 PVI/CTI w/o  catheter induced ventricular arrhythmia on EPS  07/22/19 cMRI w/ w/o contrast w/ small LV w/ concentric lCH, septum 16 mm max, normal function, LGE of  RV insertion suggesting PH, normal atrial size.  Vasodilator MPI 07/06/19 low risk without inducible abnormality or infarct.  EKG:  EKG is personally reviewed.  The ekg ordered today demonstrates sinus bradycardia, first degree AV block, IVCD, 58 bpm  Recent Labs: 02/15/2020: ALT 22; B Natriuretic Peptide 30.1; BUN 16; Creatinine, Ser 1.33; Hemoglobin 14.1; Magnesium 2.3; Platelets 210; Potassium 3.4; Sodium 139; TSH 0.744  Recent Lipid Panel    Component Value Date/Time   CHOL 158 02/15/2020 0559   TRIG 95 02/15/2020 0559   HDL 28 (L) 02/15/2020 0559   CHOLHDL 5.6 02/15/2020 0559   VLDL 19 02/15/2020 0559   LDLCALC 111 (H) 02/15/2020 0559    Physical Exam:    VS:  BP 116/80 (BP Location: Left Arm, Patient Position: Sitting, Cuff Size: Large)   Pulse (!) 58   Ht $R'5\' 11"'ij$  (1.803 m)   Wt 210 lb (95.3 kg)   BMI 29.29 kg/m     Wt Readings from Last 3 Encounters:  02/28/20 210 lb (95.3 kg)  02/14/20 207 lb 3.7 oz (94 kg)  08/23/19 216 lb 0.8 oz (98 kg)    GEN: Well nourished, well developed in no acute distress HEENT: Normal, moist mucous membranes NECK: No JVD CARDIAC: regular rhythm, normal S1 and S2, no rubs or gallops. No murmurs. VASCULAR: Radial and DP pulses 2+ bilaterally. No carotid bruits RESPIRATORY:  Clear to auscultation without rales, wheezing or rhonchi  ABDOMEN: Soft, non-tender, non-distended MUSCULOSKELETAL:  Ambulates independently SKIN: Warm and dry, no edema NEUROLOGIC:  Alert and oriented x 3. No focal neuro deficits noted. PSYCHIATRIC:  Normal affect    ASSESSMENT:    1. Chest pain, unspecified type   2. Nonocclusive coronary atherosclerosis of native coronary artery   3. PAF (paroxysmal atrial fibrillation) ([place])   4. Primary hypertension   5. Pure hypercholesterolemia   6. Cardiac risk counseling    7. Counseling on health promotion and disease prevention    PLAN:    Minimal nonobstructive CAD History of TIA -reviewed results of CT cardiac -he prefers complementary and alternative treatments if possible -continue atorvastatin 40 mg daily -no aspirin as he is on apixaban  Chest pain: -likely noncardiac in origin, given CT results -counseled on red flag warning signs that need immediate medical attention  Paroxysmal atrial fibrillation, s/p prior ablations Sinus bradycardia, 1st degree  AV block, IVCD -on apixaban, sotalol -follows with [facility] EP  Hypertension: at goal today -continue amlodipine, triamterene-HCTZ  Hyperlipidemia: -continue atorvastatin, LDL goal <70  Cardiac risk counseling and prevention recommendations: -recommend heart healthy/Mediterranean diet, with whole grains, fruits, vegetable, fish, lean meats, nuts, and olive oil. Limit salt. -recommend moderate walking, 3-5 times/week for 30-50 minutes each session. Aim for at least 150 minutes.week. Goal should be pace of 3 miles/hours, or walking 1.5 miles in 30 minutes -recommend avoidance of tobacco products. Avoid excess alcohol. -ASCVD risk score: The 10-year ASCVD risk score Mikey Bussing DC Brooke Bonito., et al., 2013) is: 12.4%   Values used to calculate the score:     Age: 25 years     Sex: Male     Is Non-Hispanic African American: Yes     Diabetic: No     Tobacco smoker: No     Systolic Blood Pressure: 297 mmHg     Is BP treated: Yes     HDL Cholesterol: 28 mg/dL     Total Cholesterol: 158 mg/dL    Plan for follow up: 1 year or sooner as needed  Buford Dresser, MD, PhD [place]  [facility]    Medication Adjustments/Labs and Tests Ordered: Current medicines are reviewed at length with the patient today.  Concerns regarding medicines are outlined above.  Orders Placed This Encounter  Procedures  . EKG 12-Lead   No orders of the defined types were placed in this encounter.   Patient  Instructions  Medication Instructions:  Your Physician recommend you continue on your current medication as directed.    *If you need a refill on your cardiac medications before your next appointment, please call your pharmacy*   Lab Work: None  Testing/Procedures: None   Follow-Up: At [facility], you and your health needs are our priority.  As part of our continuing mission to provide you with exceptional heart care, we have created designated Provider Care Teams.  These Care Teams include your primary Cardiologist (physician) and Advanced Practice Providers (APPs -  Physician Assistants and Nurse Practitioners) who all work together to provide you with the care you need, when you need it.  We recommend signing up for the patient portal called "MyChart".  Sign up information is provided on this After Visit Summary.  MyChart is used to connect with patients for Virtual Visits (Telemedicine).  Patients are able to view lab/test results, encounter notes, upcoming appointments, etc.  Non-urgent messages can be sent to your provider as well.   To learn more about what you can do with MyChart, go to [email or web address].    Your next appointment:   1 year(s)  The format for your next appointment:   In Person  Provider:   Buford Dresser, MD     Signed, Buford Dresser, MD PhD 02/28/2020  [place]

## 2020-03-06 DIAGNOSIS — I272 Pulmonary hypertension, unspecified: Secondary | ICD-10-CM | POA: Insufficient documentation

## 2020-05-17 DIAGNOSIS — J984 Other disorders of lung: Secondary | ICD-10-CM | POA: Insufficient documentation

## 2020-06-18 ENCOUNTER — Encounter: Payer: Self-pay | Admitting: Cardiology

## 2020-07-04 IMAGING — DX ABDOMEN - 1 VIEW
1 series · 1 of 1 positions shown · non-contrast
Comparison: None.

CLINICAL DATA: Constipation few days, shortness of

EXAM:
ABDOMEN - 1 VIEW

[t abdomen supine]
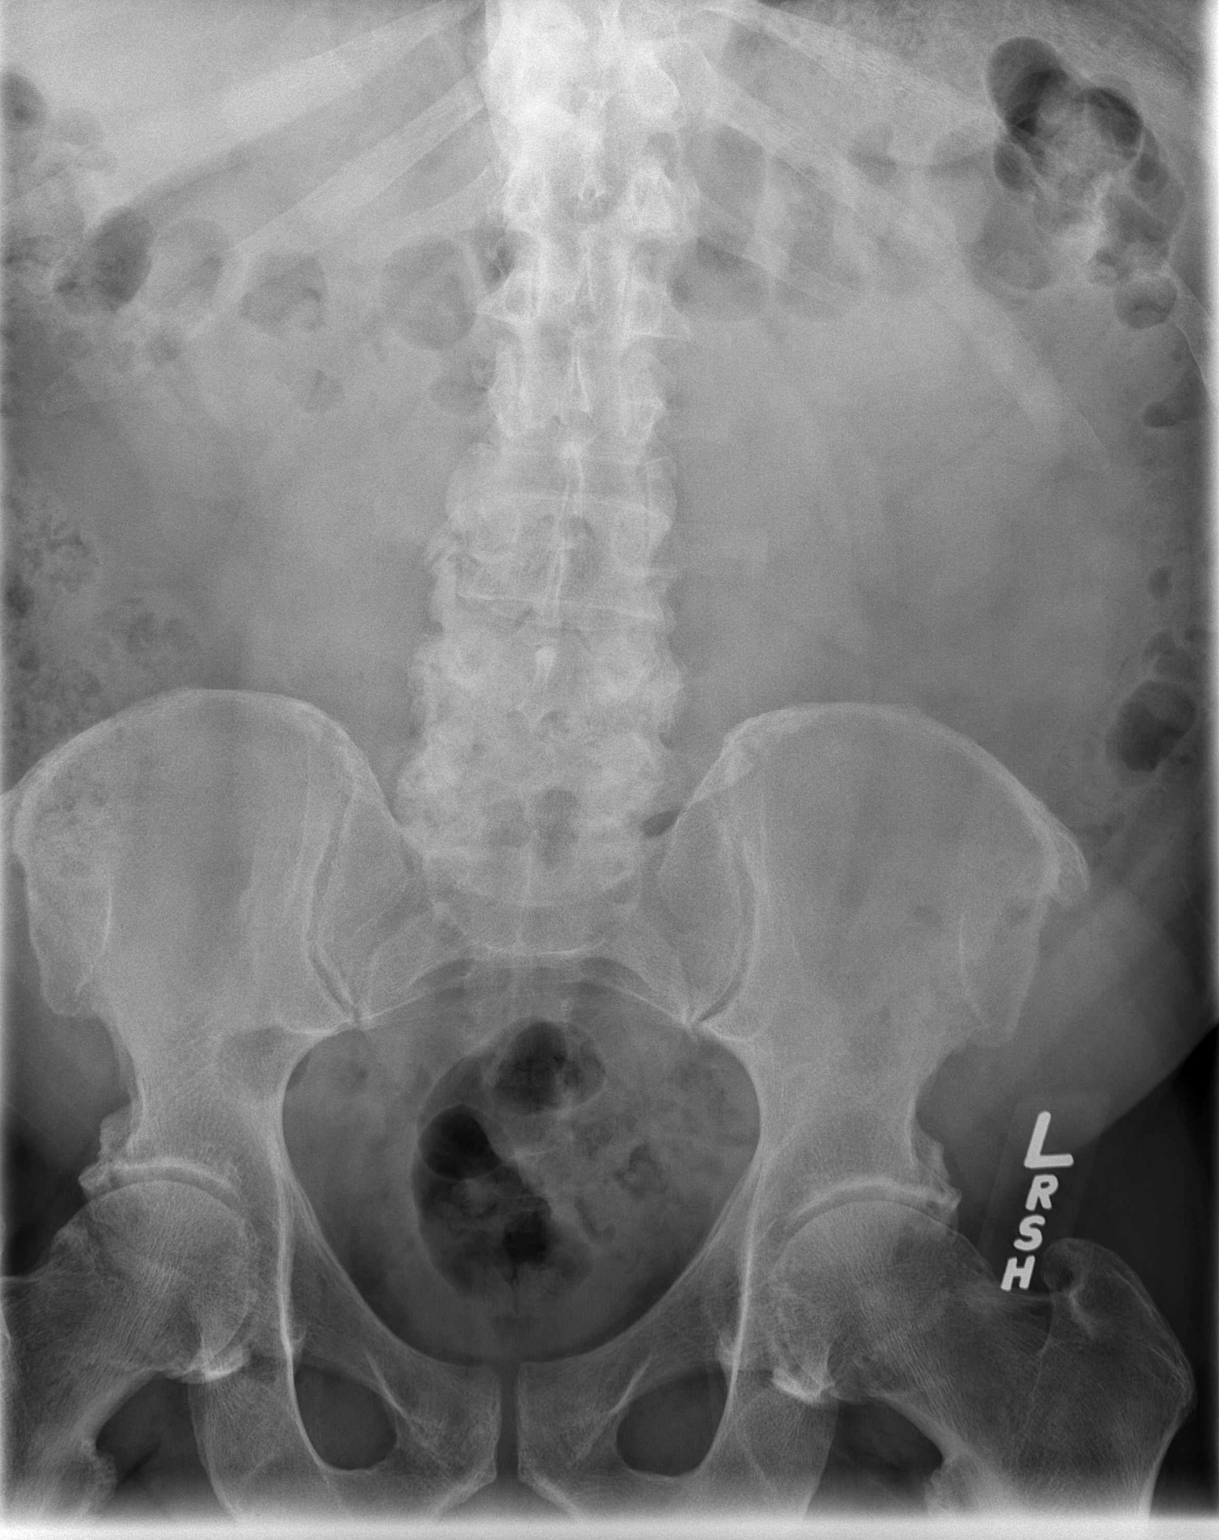

[1 of 1 positions shown; findings below may reference images not displayed]

FINDINGS: There is a moderate amount of stool in the ascending colon. There is
no bowel dilatation to suggest obstruction. There is no evidence of
pneumoperitoneum, portal venous gas or pneumatosis.

There are no pathologic calcifications along the expected course of
the ureters.

The osseous structures are unremarkable.
IMPRESSION: No acute abdominal abnormality.

## 2020-08-18 DIAGNOSIS — I200 Unstable angina: Secondary | ICD-10-CM | POA: Insufficient documentation

## 2020-11-27 ENCOUNTER — Emergency Department (HOSPITAL_BASED_OUTPATIENT_CLINIC_OR_DEPARTMENT_OTHER): Payer: Medicare Other

## 2020-11-27 ENCOUNTER — Emergency Department (HOSPITAL_BASED_OUTPATIENT_CLINIC_OR_DEPARTMENT_OTHER)
Admission: EM | Admit: 2020-11-27 | Discharge: 2020-11-27 | Disposition: A | Discharge status: Home / Self Care | Payer: Medicare Other | Attending: Emergency Medicine | Admitting: Emergency Medicine

## 2020-11-27 ENCOUNTER — Other Ambulatory Visit: Payer: Self-pay

## 2020-11-27 ENCOUNTER — Encounter (HOSPITAL_BASED_OUTPATIENT_CLINIC_OR_DEPARTMENT_OTHER): Payer: Self-pay

## 2020-11-27 DIAGNOSIS — Z79899 Other long term (current) drug therapy: Secondary | ICD-10-CM | POA: Insufficient documentation

## 2020-11-27 DIAGNOSIS — R531 Weakness: Secondary | ICD-10-CM

## 2020-11-27 DIAGNOSIS — Z7901 Long term (current) use of anticoagulants: Secondary | ICD-10-CM | POA: Insufficient documentation

## 2020-11-27 DIAGNOSIS — J449 Chronic obstructive pulmonary disease, unspecified: Secondary | ICD-10-CM | POA: Insufficient documentation

## 2020-11-27 DIAGNOSIS — K921 Melena: Secondary | ICD-10-CM | POA: Diagnosis not present

## 2020-11-27 DIAGNOSIS — R1084 Generalized abdominal pain: Secondary | ICD-10-CM | POA: Insufficient documentation

## 2020-11-27 DIAGNOSIS — N183 Chronic kidney disease, stage 3 unspecified: Secondary | ICD-10-CM | POA: Insufficient documentation

## 2020-11-27 DIAGNOSIS — I129 Hypertensive chronic kidney disease with stage 1 through stage 4 chronic kidney disease, or unspecified chronic kidney disease: Secondary | ICD-10-CM | POA: Insufficient documentation

## 2020-11-27 DIAGNOSIS — I4891 Unspecified atrial fibrillation: Secondary | ICD-10-CM | POA: Insufficient documentation

## 2020-11-27 DIAGNOSIS — R195 Other fecal abnormalities: Secondary | ICD-10-CM

## 2020-11-27 LAB — BASIC METABOLIC PANEL
Anion gap: 9 (ref 5–15)
BUN: 11 mg/dL (ref 8–23)
CO2: 27 mmol/L (ref 22–32)
Calcium: 9.7 mg/dL (ref 8.9–10.3)
Chloride: 104 mmol/L (ref 98–111)
Creatinine, Ser: 1.36 mg/dL — ABNORMAL HIGH (ref 0.61–1.24)
GFR, Estimated: 59 mL/min — ABNORMAL LOW (ref >60)
Glucose, Bld: 96 mg/dL (ref 70–99)
Potassium: 3.7 mmol/L (ref 3.5–5.1)
Sodium: 140 mmol/L (ref 135–145)

## 2020-11-27 LAB — CBC
HCT: 45.2 % (ref 39.0–52.0)
Hemoglobin: 14.9 g/dL (ref 13.0–17.0)
MCH: 28.1 pg (ref 26.0–34.0)
MCHC: 33 g/dL (ref 30.0–36.0)
MCV: 85.1 fL (ref 80.0–100.0)
Platelets: 267 10*3/uL (ref 150–400)
RBC: 5.31 MIL/uL (ref 4.22–5.81)
RDW: 13 % (ref 11.5–15.5)
WBC: 7.1 10*3/uL (ref 4.0–10.5)
nRBC: 0 % (ref 0.0–0.2)

## 2020-11-27 LAB — TROPONIN I (HIGH SENSITIVITY)
Troponin I (High Sensitivity): 8 ng/L (ref <18)
Troponin I (High Sensitivity): 9 ng/L (ref <18)

## 2020-11-27 LAB — URINALYSIS, ROUTINE W REFLEX MICROSCOPIC
Bilirubin Urine: NEGATIVE
Glucose, UA: NEGATIVE mg/dL
Hgb urine dipstick: NEGATIVE
Ketones, ur: NEGATIVE mg/dL
Leukocytes,Ua: NEGATIVE
Nitrite: NEGATIVE
Specific Gravity, Urine: 1.014 (ref 1.005–1.030)
pH: 6.5 (ref 5.0–8.0)

## 2020-11-27 LAB — OCCULT BLOOD X 1 CARD TO LAB, STOOL: Fecal Occult Bld: POSITIVE — AB

## 2020-11-27 MED ORDER — ONDANSETRON 4 MG PO TBDP: 4.0000 mg | ORAL_TABLET | Freq: Three times a day (TID) | ORAL | 0 refills | Status: DC | PRN

## 2020-11-27 MED ORDER — ONDANSETRON HCL 4 MG/2ML IJ SOLN: 4.0000 mg | Freq: Once | INTRAMUSCULAR | Status: DC

## 2020-11-27 MED ORDER — FENTANYL CITRATE (PF) 100 MCG/2ML IJ SOLN: 50.0000 ug | Freq: Once | INTRAMUSCULAR | Status: DC

## 2020-11-27 MED ORDER — SODIUM CHLORIDE 0.9 % IV BOLUS
500.0000 mL | Freq: Once | INTRAVENOUS | Status: AC
  Administered 2020-11-27: 500 mL via INTRAVENOUS

## 2020-11-27 MED ORDER — IOHEXOL 350 MG/ML SOLN
80.0000 mL | Freq: Once | INTRAVENOUS | Status: AC | PRN
  Administered 2020-11-27: 80 mL via INTRAVENOUS

## 2020-11-27 NOTE — ED Provider Notes (Signed)
Care assumed from Dr. Melina Copa 26 PM.  60 year old male presenting for diarrhea, abdominal pain, and generalized weakness.  No gross blood on DRE.  Stool was Hemoccult positive.  Hemoglobin is stable.  Creatinine is at baseline.  He is waiting on a delta troponin.  Plan is for discharge with follow-up by his GI doctor. Physical Exam  BP (!) 179/107   Pulse (!) 54   Temp 97.8 F (36.6 C) (Oral)   Resp 15   Ht 6' (1.829 m)   Wt 97.1 kg   SpO2 97%   BMI 29.02 kg/m   Physical Exam Constitutional:      General: He is not in acute distress.    Appearance: Normal appearance. He is not ill-appearing, toxic-appearing or diaphoretic.  Neurological:     Mental Status: He is alert.    ED Course/Procedures   Clinical Course as of 11/28/20 1153  Mon Nov 27, 2020  1545 Patient declined pain med but states he feels a little bit better.  Still feels very tired.  Doubt significant GI bleed because his hemoglobin is stable and his BUN is not elevated.  He has fecal occult positive though. [MB]    Clinical Course User Index [MB] [name], MD    Procedures  MDM  Delta troponin was normal.  On assessment, patient resting comfortably.  Patient comfortable with discharge home.  He will follow up with his outpatient GI provider as needed.  Patient discharged in stable condition.       Godfrey Pick, MD 11/28/20 1154

## 2020-11-27 NOTE — ED Triage Notes (Signed)
Pt arrives POV with c/o N/V over the weekend with frequent bowel movements that he states was not diarrhea.  States bowel movements are black but that he takes an iron supplement.  Presents today with continued frequent bowel movements, weakness, and palpitations.  Denies chest pain, shortness of breath, and fever.

## 2020-11-27 NOTE — Discharge Instructions (Signed)
You were seen in the emergency department for abdominal pain loose stools and feeling generally weak.  You had lab work and a CAT scan that did not show any significant abnormalities.  Your stool was trace positive for blood so this will need to be closely watched by you and your GI doctor due to you being on a blood thinner.  Please return to the emergency department if any worsening or concerning symptoms

## 2020-11-27 NOTE — ED Provider Notes (Signed)
[facility] EMERGENCY DEPT Provider Note   CSN: [id number] Arrival date & time: 11/27/20  1241     History Chief Complaint  Patient presents with   Weakness    [name] is a 60 y.o. male.  He had crampy low abdominal pain along with nausea vomiting and diarrhea that started 3 days ago.  Yesterday he felt a lot better but then had extreme fatigue in the evening.  When he got up this morning the abdominal pain had recurred.  He has had 3 formed bowel movements.  Feels generally weak.  He talked to his GI doctor who recommended he come to the emergency department.  No chest pain.  Minimal shortness of breath.  Did feel some palpitations a few days ago but he says he has them at times with his A. fib.  He is on anticoagulation.  No fevers or chills.  The history is provided by the patient.  Weakness Severity:  Moderate Onset quality:  Gradual Duration:  2 days Timing:  Constant Progression:  Unchanged Chronicity:  New Relieved by:  Nothing Worsened by:  Activity Ineffective treatments:  Rest Associated symptoms: abdominal pain, diarrhea, nausea, shortness of breath and vomiting   Associated symptoms: no chest pain, no cough, no fever, no frequency, no headaches and no loss of consciousness   Abdominal pain:    Location:  Generalized   Quality: cramping     Severity:  Moderate   Onset quality:  Gradual   Duration:  3 days   Timing:  Intermittent   Progression:  Improving   Chronicity:  Recurrent     Past Medical History:  Diagnosis Date   A-fib ([place])    Chest pain    a. 11/2002 Cath: EF 68%, LM nl, LAD small, nl, LCX nl, RCA tortuous/nl;  b. 08/2011 St Echo: EF 60%, normal contractility, no scar/ischemia.   COPD (chronic obstructive pulmonary disease) (HCC)    Enlarged prostate    GERD (gastroesophageal reflux disease)    GI bleeding 08/2018   Hypertension    Sickle cell trait (HCC)    Sleep apnea    SVT (supraventricular tachycardia) [facility])      Patient Active Problem List   Diagnosis Date Noted   COPD (chronic obstructive pulmonary disease) ([place])    Syncope    Anticoagulated    Acute blood loss anemia    Acute lower GI bleeding 04/23/2019   Cervical radiculopathy 03/08/2019   Adenomatous polyp of transverse colon    Gastrointestinal hemorrhage 08/25/2018   CKD (chronic kidney disease) stage 3, GFR 30-59 ml/min 08/25/2018   PAF (paroxysmal atrial fibrillation) ([place]) 08/25/2018   Atypical chest pain 04/04/2017   Abdominal pain 12/05/2013   Diarrhea 12/05/2013   Hypokalemia 12/05/2013   Chest pain- atypical. Normal LHC 03/2013 03/10/2013   HTN (hypertension) 03/10/2013   Nonspecific abnormal electrocardiogram (ECG) (EKG) 03/10/2013    Past Surgical History:  Procedure Laterality Date   ATRIAL FIBRILLATION ABLATION     BIOPSY  08/27/2018   Procedure: BIOPSY;  Surgeon: Thornton Park, MD;  Location: [facility];  Service: Gastroenterology;;   BIOPSY  08/28/2018   Procedure: BIOPSY;  Surgeon: Thornton Park, MD;  Location: [facility];  Service: Gastroenterology;;   COLONOSCOPY WITH PROPOFOL N/A 08/28/2018   Procedure: COLONOSCOPY WITH PROPOFOL;  Surgeon: Thornton Park, MD;  Location: [facility];  Service: Gastroenterology;  Laterality: N/A;   ENTEROSCOPY N/A 08/27/2018   Procedure: ENTEROSCOPY;  Surgeon: Thornton Park, MD;  Location: [facility];  Service: Gastroenterology;  Laterality: N/A;   GIVENS CAPSULE STUDY N/A 08/28/2018   Procedure: GIVENS CAPSULE STUDY;  Surgeon: Thornton Park, MD;  Location: [facility];  Service: Gastroenterology;  Laterality: N/A;   LEFT HEART CATHETERIZATION WITH CORONARY ANGIOGRAM N/A 03/11/2013   Procedure: LEFT HEART CATHETERIZATION WITH CORONARY ANGIOGRAM;  Surgeon: Jolaine Artist, MD;  Location: [facility] CATH LAB;  Service: Cardiovascular;  Laterality: N/A;   POLYPECTOMY  08/28/2018   Procedure: POLYPECTOMY;  Surgeon: Thornton Park, MD;  Location: [facility] ENDOSCOPY;   Service: Gastroenterology;;   TONSILLECTOMY     TRANSURETHRAL RESECTION OF PROSTATE         Family History  Problem Relation Age of Onset   CAD Mother        died in her 73's - MI   Multiple myeloma Brother    Sickle cell anemia Father        died @ 72   Gastric cancer Neg Hx     Social History   Tobacco Use   Smoking status: Never   Smokeless tobacco: Never  Vaping Use   Vaping Use: Never used  Substance Use Topics   Alcohol use: No   Drug use: Never    Home Medications Prior to Admission medications   Medication Sig Start Date End Date Taking? Authorizing Provider  acetaminophen (TYLENOL) 325 MG tablet Take 650 mg by mouth every 6 (six) hours as needed for moderate pain.    [provider]  amLODipine (NORVASC) 5 MG tablet Take 1 tablet (5 mg total) by mouth daily. 04/30/19 02/15/20  Kayleen Memos, DO  apixaban (ELIQUIS) 5 MG TABS tablet Take 1 tablet (5 mg total) by mouth 2 (two) times daily. Take Eliquis 5 mg twice a day one week after GI bleed.  Do not restart Xarelto. Patient taking differently: Take 5 mg by mouth 2 (two) times daily.  05/06/19   Kayleen Memos, DO  atorvastatin (LIPITOR) 40 MG tablet Take 1 tablet (40 mg total) by mouth daily. 02/15/20 02/14/21  Isaiah Serge, NP  cetirizine (ZYRTEC) 10 MG tablet Take 10 mg by mouth daily as needed for allergies.     [provider]  Colchicine 0.6 MG CAPS Take 0.6 mg by mouth 2 (two) times daily as needed. Gout flares 01/17/20   [provider]  ferrous sulfate 325 (65 FE) MG tablet Take 1 tablet (325 mg total) by mouth daily with breakfast. 04/30/19 02/15/20  Kayleen Memos, DO  fluticasone (FLONASE) 50 MCG/ACT nasal spray Place 1 spray into both nostrils daily as needed for allergies or rhinitis.    [provider]  Fluticasone-Salmeterol (ADVAIR) 250-50 MCG/DOSE AEPB Inhale 1 puff into the lungs 2 (two) times daily as needed (wheezing).    [provider]  LINZESS 290 MCG  CAPS capsule Take 290 mcg by mouth every other day. 10/07/19   [provider]  nitroGLYCERIN (NITROSTAT) 0.4 MG SL tablet Place 1 tablet (0.4 mg total) under the tongue every 5 (five) minutes as needed for chest pain. 03/12/13   Thurnell Lose, MD  ondansetron (ZOFRAN) 8 MG tablet Take 8 mg by mouth every 8 (eight) hours as needed for nausea or vomiting.  10/20/18   [provider]  oxyCODONE-acetaminophen (PERCOCET/ROXICET) 5-325 MG tablet Take 1 tablet by mouth every 6 (six) hours as needed for moderate pain or severe pain.     [provider]  pantoprazole (PROTONIX) 40 MG tablet Take 40 mg by mouth daily. 07/28/14  [provider]  polyethylene glycol (MIRALAX / GLYCOLAX) 17 g packet Take 17 g by mouth daily. Patient taking differently: Take 17 g by mouth daily as needed for mild constipation.  04/30/19   Kayleen Memos, DO  sildenafil (REVATIO) 20 MG tablet Take 60-100 mg by mouth as directed.  11/25/18   [provider]  sotalol (BETAPACE) 80 MG tablet Take 1 tablet (80 mg total) by mouth every 12 (twelve) hours. 04/29/19 02/15/20  Kayleen Memos, DO  SUMAtriptan (IMITREX) 100 MG tablet Take 100 mg by mouth as directed. Take one tablet at onset, then an additional tablet every 12 hours as needed for migraine 02/04/20   [provider]  traMADol (ULTRAM) 50 MG tablet Take 50 mg by mouth every 6 (six) hours as needed for moderate pain.    [provider]  triamterene-hydrochlorothiazide (DYAZIDE) 37.5-25 MG capsule Take 1 capsule by mouth every morning. 11/27/19   [provider]  gabapentin (NEURONTIN) 300 MG capsule Take 300 mg by mouth at bedtime.  02/26/19  [provider]    Allergies    Patient has no known allergies.  Review of Systems   Review of Systems  Constitutional:  Positive for fatigue. Negative for fever.  HENT:  Negative for sore throat.   Eyes:  Negative for visual disturbance.  Respiratory:   Positive for shortness of breath. Negative for cough.   Cardiovascular:  Negative for chest pain.  Gastrointestinal:  Positive for abdominal pain, diarrhea, nausea and vomiting.  Genitourinary:  Negative for frequency.  Musculoskeletal:  Negative for back pain.  Skin:  Negative for rash.  Neurological:  Positive for weakness. Negative for loss of consciousness and headaches.   Physical Exam Updated Vital Signs BP (!) 192/119 (BP Location: Left Arm)   Pulse (!) 53   Temp 97.8 F (36.6 C) (Oral)   Resp 15   Ht 6' (1.829 m)   Wt 97.1 kg   SpO2 100%   BMI 29.02 kg/m   Physical Exam Vitals and nursing note reviewed.  Constitutional:      Appearance: Normal appearance. He is well-developed.  HENT:     Head: Normocephalic and atraumatic.  Eyes:     Conjunctiva/sclera: Conjunctivae normal.  Cardiovascular:     Rate and Rhythm: Normal rate and regular rhythm.     Heart sounds: No murmur heard. Pulmonary:     Effort: Pulmonary effort is normal. No respiratory distress.     Breath sounds: Normal breath sounds.  Abdominal:     Palpations: Abdomen is soft.     Tenderness: There is no abdominal tenderness. There is no guarding or rebound.  Musculoskeletal:        General: No deformity or signs of injury. Normal range of motion.     Cervical back: Neck supple.     Right lower leg: No edema.     Left lower leg: No edema.  Skin:    General: Skin is warm and dry.  Neurological:     General: No focal deficit present.     Mental Status: He is alert.     Gait: Gait normal.    ED Results / Procedures / Treatments   Labs (all labs ordered are listed, but only abnormal results are displayed) Labs Reviewed  BASIC METABOLIC PANEL - Abnormal; Notable for the following components:      Result Value   Creatinine, Ser 1.36 (*)    GFR, Estimated 59 (*)    All other  components within normal limits  URINALYSIS, ROUTINE W REFLEX MICROSCOPIC - Abnormal; Notable for the following components:    Protein, ur TRACE (*)    All other components within normal limits  OCCULT BLOOD X 1 CARD TO LAB, STOOL - Abnormal; Notable for the following components:   Fecal Occult Bld POSITIVE (*)    All other components within normal limits  CBC  TROPONIN I (HIGH SENSITIVITY)  TROPONIN I (HIGH SENSITIVITY)    EKG EKG Interpretation  Date/Time:  Monday November 27 2020 12:53:38 EDT Ventricular Rate:  52 PR Interval:  216 QRS Duration: 108 QT Interval:  496 QTC Calculation: 461 R Axis:   260 Text Interpretation: Sinus bradycardia with 1st degree A-V block Right superior axis deviation Pulmonary disease pattern Incomplete right bundle branch block ST and t wave changes similar to prior 11/21 Abnormal ECG Confirmed by Aletta Edouard [id number]) on 11/27/2020 12:57:37 PM  Radiology CT Abdomen Pelvis W Contrast  Result Date: 11/27/2020 CLINICAL DATA:  Acute nonlocalized abdominal pain, nausea and vomiting over the weekend with frequent bowel movements that he states was not diarrhea, weakness, palpitations, history atrial fibrillation, anemia for which he takes iron supplements, COPD, hypertension, sickle trait, GERD EXAM: CT ABDOMEN AND PELVIS WITH CONTRAST TECHNIQUE: Multidetector CT imaging of the abdomen and pelvis was performed using the standard protocol following bolus administration of intravenous contrast. Sagittal and coronal MPR images reconstructed from axial data set. CONTRAST:  66m OMNIPAQUE IOHEXOL 350 MG/ML SOLN IV. No oral contrast. COMPARISON:  04/09/2020 FINDINGS: Lower chest: Minimal bibasilar atelectasis Hepatobiliary: Fatty infiltration of liver. Gallbladder unremarkable. Pancreas: Normal appearance Spleen: Normal appearance Adrenals/Urinary Tract: Adrenal glands normal appearance. Small cyst at inferior LEFT kidney 1.6 cm diameter. Kidneys otherwise normal appearance without mass or hydronephrosis. Bladder decompressed, wall thickness inadequately assessed. Stomach/Bowel: Normal  appendix. Extensive diverticulosis of distal ascending 3 sigmoid colon. No definite colonic wall thickening or pericolic infiltrative changes seen to suggest acute diverticulitis. Small bowel loops decompressed and unremarkable. Stomach decompressed. Vascular/Lymphatic: Atherosclerotic calcifications aorta and iliac arteries without aneurysm. No adenopathy. Reproductive: Minimal prostatic enlargement. Seminal vesicles unremarkable. Other: Tiny umbilical hernia containing fat. No free air or free fluid. BILATERAL inguinal hernias containing fat slightly larger on RIGHT. Musculoskeletal: Mild degenerative changes of the hip joints bilaterally. Multilevel degenerative disc and facet disease changes of thoracolumbar spine with mild grade 1 anterolisthesis L4-L5. IMPRESSION: Extensive colonic diverticulosis without evidence of acute diverticulitis. Fatty infiltration of liver. Minimal prostatic enlargement. BILATERAL inguinal hernias and tiny umbilical hernia containing fat. Aortic Atherosclerosis (ICD10-I70.0). Electronically Signed   By: MLavonia DanaM.D.   On: 11/27/2020 16:00   DG Chest Port 1 View  Result Date: 11/27/2020 CLINICAL DATA:  Palpitations. EXAM: PORTABLE CHEST 1 VIEW COMPARISON:  Chest x-ray dated February 14, 2020. FINDINGS: The heart size and mediastinal contours are within normal limits. Both lungs are clear. The visualized skeletal structures are unremarkable. IMPRESSION: No active disease. Electronically Signed   By: WTitus DubinM.D.   On: 11/27/2020 13:39    Procedures Procedures   Medications Ordered in ED Medications  fentaNYL (SUBLIMAZE) injection 50 mcg (has no administration in time range)  ondansetron (ZOFRAN) injection 4 mg (has no administration in time range)  sodium chloride 0.9 % bolus 500 mL (has no administration in time range)    ED Course  I have reviewed the triage vital signs and the nursing notes.  Pertinent labs & imaging results that were available during my  care of the patient were reviewed  by me and considered in my medical decision making (see chart for details).  Clinical Course as of 11/27/20 1807  Mon Nov 27, 2020  1545 Patient declined pain med but states he feels a little bit better.  Still feels very tired.  Doubt significant GI bleed because his hemoglobin is stable and his BUN is not elevated.  He has fecal occult positive though. [MB]    Clinical Course User Index [MB] [name], MD   MDM Rules/Calculators/A&P                          This patient complains of generalized abdominal pain loose stools feeling fatigued; this involves an extensive number of treatment Options and is a complaint that carries with it a high risk of complications and Morbidity. The differential includes diverticulitis, colitis, GI bleed, obstruction, UTI, gastroenteritis  I ordered, reviewed and interpreted labs, which included CBC with normal white count normal hemoglobin, chemistries normal mildly elevated creatinine baseline for him.  BUN is not elevated doubt significant GI bleed.  Fecal occult is trace positive.  Urinalysis unremarkable and troponins flat I ordered medication IV fluids I ordered imaging studies which included CT abdomen and pelvis and chest x-ray and I independently    visualized and interpreted imaging which showed no acute findings.  Does have extensive colonic diverticulosis. Previous records obtained and reviewed in epic, patient gets most of his care through the [facility]  After the interventions stated above, I reevaluated the patient and found patient still to be feeling fatigued although has not had any diarrhea or vomiting here.  He is pending a second troponin.  His care is signed out to oncoming provider Dr. Doren Custard to follow-up on delta troponin.  If not significantly elevated and patient is otherwise doing better I feel he can probably be discharged to follow-up closely with his gastroenterologist.  No evidence of  significant active GI bleed.   Final Clinical Impression(s) / ED Diagnoses Final diagnoses:  Generalized abdominal pain  Heme positive stool  Weakness generalized    Rx / DC Orders ED Discharge Orders     None        [name], MD 11/27/20 [phone]

## 2020-11-29 ENCOUNTER — Encounter (HOSPITAL_BASED_OUTPATIENT_CLINIC_OR_DEPARTMENT_OTHER): Payer: Self-pay | Admitting: Obstetrics and Gynecology

## 2020-11-29 ENCOUNTER — Other Ambulatory Visit: Payer: Self-pay

## 2020-11-29 ENCOUNTER — Emergency Department (HOSPITAL_BASED_OUTPATIENT_CLINIC_OR_DEPARTMENT_OTHER)
Admission: EM | Admit: 2020-11-29 | Discharge: 2020-11-29 | Disposition: A | Discharge status: Home / Self Care | Payer: Medicare Other | Attending: Emergency Medicine | Admitting: Emergency Medicine

## 2020-11-29 DIAGNOSIS — R002 Palpitations: Secondary | ICD-10-CM | POA: Insufficient documentation

## 2020-11-29 DIAGNOSIS — R109 Unspecified abdominal pain: Secondary | ICD-10-CM | POA: Insufficient documentation

## 2020-11-29 DIAGNOSIS — M79604 Pain in right leg: Secondary | ICD-10-CM | POA: Insufficient documentation

## 2020-11-29 DIAGNOSIS — R0600 Dyspnea, unspecified: Secondary | ICD-10-CM | POA: Insufficient documentation

## 2020-11-29 DIAGNOSIS — M79605 Pain in left leg: Secondary | ICD-10-CM | POA: Diagnosis not present

## 2020-11-29 DIAGNOSIS — R110 Nausea: Secondary | ICD-10-CM | POA: Insufficient documentation

## 2020-11-29 DIAGNOSIS — R531 Weakness: Secondary | ICD-10-CM | POA: Diagnosis not present

## 2020-11-29 DIAGNOSIS — Z5321 Procedure and treatment not carried out due to patient leaving prior to being seen by health care provider: Secondary | ICD-10-CM | POA: Insufficient documentation

## 2020-11-29 LAB — BASIC METABOLIC PANEL
Anion gap: 9 (ref 5–15)
BUN: 11 mg/dL (ref 8–23)
CO2: 26 mmol/L (ref 22–32)
Calcium: 9.5 mg/dL (ref 8.9–10.3)
Chloride: 106 mmol/L (ref 98–111)
Creatinine, Ser: 1.28 mg/dL — ABNORMAL HIGH (ref 0.61–1.24)
GFR, Estimated: >60 mL/min (ref >60)
Glucose, Bld: 92 mg/dL (ref 70–99)
Potassium: 3.5 mmol/L (ref 3.5–5.1)
Sodium: 141 mmol/L (ref 135–145)

## 2020-11-29 LAB — CBC
HCT: 43.6 % (ref 39.0–52.0)
Hemoglobin: 14.5 g/dL (ref 13.0–17.0)
MCH: 28.3 pg (ref 26.0–34.0)
MCHC: 33.3 g/dL (ref 30.0–36.0)
MCV: 85.2 fL (ref 80.0–100.0)
Platelets: 251 10*3/uL (ref 150–400)
RBC: 5.12 MIL/uL (ref 4.22–5.81)
RDW: 13.1 % (ref 11.5–15.5)
WBC: 6.7 10*3/uL (ref 4.0–10.5)
nRBC: 0 % (ref 0.0–0.2)

## 2020-11-29 LAB — TROPONIN I (HIGH SENSITIVITY): Troponin I (High Sensitivity): 9 ng/L (ref <18)

## 2020-11-29 LAB — BRAIN NATRIURETIC PEPTIDE: B Natriuretic Peptide: 56 pg/mL (ref 0.0–100.0)

## 2020-11-29 NOTE — ED Triage Notes (Signed)
Patient reports to the ER for abdominal pain, weakness and perceived palpitations. Patient reports he called his GI doctor. Patient had heme positive stool last visit

## 2020-12-22 ENCOUNTER — Encounter: Payer: Self-pay | Admitting: Physical Medicine and Rehabilitation

## 2020-12-29 ENCOUNTER — Emergency Department (HOSPITAL_BASED_OUTPATIENT_CLINIC_OR_DEPARTMENT_OTHER)
Admission: EM | Admit: 2020-12-29 | Discharge: 2020-12-29 | Disposition: A | Discharge status: Home / Self Care | Payer: Medicare Other | Attending: Emergency Medicine | Admitting: Emergency Medicine

## 2020-12-29 ENCOUNTER — Encounter (HOSPITAL_BASED_OUTPATIENT_CLINIC_OR_DEPARTMENT_OTHER): Payer: Self-pay | Admitting: *Deleted

## 2020-12-29 ENCOUNTER — Other Ambulatory Visit: Payer: Self-pay

## 2020-12-29 DIAGNOSIS — Z7901 Long term (current) use of anticoagulants: Secondary | ICD-10-CM | POA: Diagnosis not present

## 2020-12-29 DIAGNOSIS — I480 Paroxysmal atrial fibrillation: Secondary | ICD-10-CM | POA: Diagnosis not present

## 2020-12-29 DIAGNOSIS — Z79899 Other long term (current) drug therapy: Secondary | ICD-10-CM | POA: Diagnosis not present

## 2020-12-29 DIAGNOSIS — J449 Chronic obstructive pulmonary disease, unspecified: Secondary | ICD-10-CM | POA: Diagnosis not present

## 2020-12-29 DIAGNOSIS — K91840 Postprocedural hemorrhage and hematoma of a digestive system organ or structure following a digestive system procedure: Secondary | ICD-10-CM | POA: Diagnosis present

## 2020-12-29 DIAGNOSIS — I129 Hypertensive chronic kidney disease with stage 1 through stage 4 chronic kidney disease, or unspecified chronic kidney disease: Secondary | ICD-10-CM | POA: Diagnosis not present

## 2020-12-29 DIAGNOSIS — N183 Chronic kidney disease, stage 3 unspecified: Secondary | ICD-10-CM | POA: Diagnosis not present

## 2020-12-29 MED ORDER — TRANEXAMIC ACID 1000 MG/10ML IV SOLN
500.0000 mg | Freq: Once | INTRAVENOUS | Status: AC
  Administered 2020-12-29: 500 mg via TOPICAL
  Filled 2020-12-29: qty 10

## 2020-12-29 MED ORDER — GELATIN ABSORBABLE 12-7 MM EX MISC
1.0000 | Freq: Once | CUTANEOUS | Status: AC
  Administered 2020-12-29: 1 via TOPICAL
  Filled 2020-12-29: qty 1

## 2020-12-29 MED ORDER — LIDOCAINE HCL 2 % IJ SOLN
5.0000 mL | Freq: Once | INTRAMUSCULAR | Status: AC
  Administered 2020-12-29: 100 mg
  Filled 2020-12-29: qty 20

## 2020-12-29 NOTE — ED Triage Notes (Signed)
C/o left upper tooth pulled this am by dentist cont bleeding today , pt is on eliquis

## 2020-12-29 NOTE — Discharge Instructions (Addendum)
Try and follow-up with your dentist if needed.  Hopefully the bleeding will stop.  You have been giving some bandage you can put over the wound if it starts to bleed again

## 2020-12-30 NOTE — ED Provider Notes (Signed)
[facility] Provider Note   CSN: [id number] Arrival date & time: 12/29/20  1923     History Chief Complaint  Patient presents with   Dental Problem    [name] is a 60 y.o. male.  HPI Patient presents with bleeding after dental extraction.  Had left-sided upper premolar pulled out by dentist today.  Is on anticoagulation for A. fib.  States they did not stop his medication.  States he has had it today.  However had continued bleeding at about 10:00 with the extraction being done prior to that.  States it is just oozing out of the mouth.  Does not feel lightheaded or dizzy.  Has placed a tea bag without control of bleeding.    Past Medical History:  Diagnosis Date   A-fib [facility])    Chest pain    a. 11/2002 Cath: EF 68%, LM nl, LAD small, nl, LCX nl, RCA tortuous/nl;  b. 08/2011 St Echo: EF 60%, normal contractility, no scar/ischemia.   COPD (chronic obstructive pulmonary disease) (HCC)    Enlarged prostate    GERD (gastroesophageal reflux disease)    GI bleeding 08/2018   Hypertension    Sickle cell trait (HCC)    Sleep apnea    SVT (supraventricular tachycardia) [facility])     Patient Active Problem List   Diagnosis Date Noted   COPD (chronic obstructive pulmonary disease) ([place])    Syncope    Anticoagulated    Acute blood loss anemia    Acute lower GI bleeding 04/23/2019   Cervical radiculopathy 03/08/2019   Adenomatous polyp of transverse colon    Gastrointestinal hemorrhage 08/25/2018   CKD (chronic kidney disease) stage 3, GFR 30-59 ml/min 08/25/2018   PAF (paroxysmal atrial fibrillation) ([place]) 08/25/2018   Atypical chest pain 04/04/2017   Abdominal pain 12/05/2013   Diarrhea 12/05/2013   Hypokalemia 12/05/2013   Chest pain- atypical. Normal LHC 03/2013 03/10/2013   HTN (hypertension) 03/10/2013   Nonspecific abnormal electrocardiogram (ECG) (EKG) 03/10/2013    Past Surgical History:  Procedure Laterality Date   ATRIAL  FIBRILLATION ABLATION     BIOPSY  08/27/2018   Procedure: BIOPSY;  Surgeon: Thornton Park, MD;  Location: [place];  Service: Gastroenterology;;   BIOPSY  08/28/2018   Procedure: BIOPSY;  Surgeon: Thornton Park, MD;  Location: [place];  Service: Gastroenterology;;   COLONOSCOPY WITH PROPOFOL N/A 08/28/2018   Procedure: COLONOSCOPY WITH PROPOFOL;  Surgeon: Thornton Park, MD;  Location: [place];  Service: Gastroenterology;  Laterality: N/A;   ENTEROSCOPY N/A 08/27/2018   Procedure: ENTEROSCOPY;  Surgeon: Thornton Park, MD;  Location: [place];  Service: Gastroenterology;  Laterality: N/A;   GIVENS CAPSULE STUDY N/A 08/28/2018   Procedure: GIVENS CAPSULE STUDY;  Surgeon: Thornton Park, MD;  Location: [place];  Service: Gastroenterology;  Laterality: N/A;   LEFT HEART CATHETERIZATION WITH CORONARY ANGIOGRAM N/A 03/11/2013   Procedure: LEFT HEART CATHETERIZATION WITH CORONARY ANGIOGRAM;  Surgeon: Jolaine Artist, MD;  Location: [facility] CATH LAB;  Service: Cardiovascular;  Laterality: N/A;   POLYPECTOMY  08/28/2018   Procedure: POLYPECTOMY;  Surgeon: Thornton Park, MD;  Location: [facility] ENDOSCOPY;  Service: Gastroenterology;;   TONSILLECTOMY     TRANSURETHRAL RESECTION OF PROSTATE         Family History  Problem Relation Age of Onset   CAD Mother        died in her 60's - MI   Multiple myeloma Brother    Sickle cell anemia Father  died @ 11   Gastric cancer Neg Hx     Social History   Tobacco Use   Smoking status: Never   Smokeless tobacco: Never  Vaping Use   Vaping Use: Never used  Substance Use Topics   Alcohol use: No   Drug use: Never    Home Medications Prior to Admission medications   Medication Sig Start Date End Date Taking? Authorizing Provider  acetaminophen (TYLENOL) 325 MG tablet Take 650 mg by mouth every 6 (six) hours as needed for moderate pain.    [provider]  amLODipine (NORVASC) 5 MG tablet Take 1 tablet  (5 mg total) by mouth daily. 04/30/19 02/15/20  Kayleen Memos, DO  apixaban (ELIQUIS) 5 MG TABS tablet Take 1 tablet (5 mg total) by mouth 2 (two) times daily. Take Eliquis 5 mg twice a day one week after GI bleed.  Do not restart Xarelto. Patient taking differently: Take 5 mg by mouth 2 (two) times daily.  05/06/19   Kayleen Memos, DO  atorvastatin (LIPITOR) 40 MG tablet Take 1 tablet (40 mg total) by mouth daily. 02/15/20 02/14/21  Isaiah Serge, NP  cetirizine (ZYRTEC) 10 MG tablet Take 10 mg by mouth daily as needed for allergies.     [provider]  Colchicine 0.6 MG CAPS Take 0.6 mg by mouth 2 (two) times daily as needed. Gout flares 01/17/20   [provider]  ferrous sulfate 325 (65 FE) MG tablet Take 1 tablet (325 mg total) by mouth daily with breakfast. 04/30/19 02/15/20  Kayleen Memos, DO  fluticasone (FLONASE) 50 MCG/ACT nasal spray Place 1 spray into both nostrils daily as needed for allergies or rhinitis.    [provider]  Fluticasone-Salmeterol (ADVAIR) 250-50 MCG/DOSE AEPB Inhale 1 puff into the lungs 2 (two) times daily as needed (wheezing).    [provider]  LINZESS 290 MCG CAPS capsule Take 290 mcg by mouth every other day. 10/07/19   [provider]  nitroGLYCERIN (NITROSTAT) 0.4 MG SL tablet Place 1 tablet (0.4 mg total) under the tongue every 5 (five) minutes as needed for chest pain. 03/12/13   Thurnell Lose, MD  ondansetron (ZOFRAN ODT) 4 MG disintegrating tablet Take 1 tablet (4 mg total) by mouth every 8 (eight) hours as needed for up to 6 doses for nausea or vomiting. 11/27/20   Godfrey Pick, MD  ondansetron (ZOFRAN) 8 MG tablet Take 8 mg by mouth every 8 (eight) hours as needed for nausea or vomiting.  10/20/18   [provider]  oxyCODONE-acetaminophen (PERCOCET/ROXICET) 5-325 MG tablet Take 1 tablet by mouth every 6 (six) hours as needed for moderate pain or severe pain.     [provider]  pantoprazole  (PROTONIX) 40 MG tablet Take 40 mg by mouth daily. 07/28/14   [provider]  polyethylene glycol (MIRALAX / GLYCOLAX) 17 g packet Take 17 g by mouth daily. Patient taking differently: Take 17 g by mouth daily as needed for mild constipation.  04/30/19   Kayleen Memos, DO  sildenafil (REVATIO) 20 MG tablet Take 60-100 mg by mouth as directed.  11/25/18   [provider]  sotalol (BETAPACE) 80 MG tablet Take 1 tablet (80 mg total) by mouth every 12 (twelve) hours. 04/29/19 02/15/20  Kayleen Memos, DO  SUMAtriptan (IMITREX) 100 MG tablet Take 100 mg by mouth as directed. Take one tablet at onset, then an additional tablet every 12 hours as needed for migraine  02/04/20   [provider]  traMADol (ULTRAM) 50 MG tablet Take 50 mg by mouth every 6 (six) hours as needed for moderate pain.    [provider]  triamterene-hydrochlorothiazide (DYAZIDE) 37.5-25 MG capsule Take 1 capsule by mouth every morning. 11/27/19   [provider]  gabapentin (NEURONTIN) 300 MG capsule Take 300 mg by mouth at bedtime.  02/26/19  [provider]    Allergies    Patient has no known allergies.  Review of Systems   Review of Systems  Constitutional:  Negative for appetite change.  HENT:         Dental bleeding  Respiratory:  Negative for shortness of breath.   Gastrointestinal:  Negative for abdominal pain.  Genitourinary:  Negative for flank pain.  Skin:  Negative for wound.  Neurological:  Negative for weakness and light-headedness.  Hematological:  Bruises/bleeds easily.   Physical Exam Updated Vital Signs BP (!) 196/115 (BP Location: Right Arm)   Pulse (!) 58   Temp (!) 97.2 F (36.2 C)   Resp 18   Ht _0  (1.803 m)   Wt 98.9 kg   SpO2 99%   BMI 30.40 kg/m   Physical Exam Vitals and nursing note reviewed.  HENT:     Head: Atraumatic.     Mouth/Throat:     Comments: Bleeding from extraction site on left upper mid mandible.  Slowly  oozing. Cardiovascular:     Rate and Rhythm: Regular rhythm.  Musculoskeletal:        General: No tenderness.     Cervical back: Neck supple.  Skin:    General: Skin is warm.     Capillary Refill: Capillary refill takes less than 2 seconds.  Neurological:     Mental Status: He is alert and oriented to person, place, and time.    ED Results / Procedures / Treatments   Labs (all labs ordered are listed, but only abnormal results are displayed) Labs Reviewed - No data to display  EKG None  Radiology No results found.  Procedures Procedures   Medications Ordered in ED Medications  tranexamic acid (CYKLOKAPRON) injection 500 mg (500 mg Topical Given by Other 12/29/20 2026)  lidocaine (XYLOCAINE) 2 % (with pres) injection 100 mg (100 mg Other Given 12/29/20 2134)  gelatin adsorbable (GELFOAM/SURGIFOAM) sponge 12-7 mm 1 each (1 each Topical Given by Other 12/29/20 2237)    ED Course  I have reviewed the triage vital signs and the nursing notes.  Pertinent labs & imaging results that were available during my care of the patient were reviewed by me and considered in my medical decision making (see chart for details).    MDM Rules/Calculators/A&P                           Patient with post dental extraction bleeding.  On anticoagulation likely contributing to his.  Initially tranexamic acid had been placed with maybe some slowing the blood but not stopping it.  Then lidocaine with epi had been placed.  Some mild slowing the bleeding also.  Gelfoam placed and it appears to have stopped bleeding.  Discussed with dentist on-call for the patient.  Can hopefully follow-up in a few days but unsure if open on Saturday.  Vitals reassuring and do not think we need to recheck hemoglobin this time.  Discharge home. Final Clinical Impression(s) / ED Diagnoses Final diagnoses:  Hemorrhage of tooth socket    Rx /  DC Orders ED Discharge Orders     None        Davonna Belling,  MD 12/30/20 [phone]

## 2021-02-09 ENCOUNTER — Other Ambulatory Visit: Payer: Self-pay | Admitting: Cardiology

## 2021-03-22 ENCOUNTER — Ambulatory Visit: Payer: Medicare Other | Admitting: Physical Medicine and Rehabilitation

## 2021-04-08 HISTORY — PX: INGUINAL HERNIA REPAIR: SHX194

## 2021-04-13 ENCOUNTER — Other Ambulatory Visit: Payer: Self-pay

## 2021-04-13 ENCOUNTER — Encounter (HOSPITAL_BASED_OUTPATIENT_CLINIC_OR_DEPARTMENT_OTHER): Payer: Self-pay

## 2021-04-13 ENCOUNTER — Emergency Department (HOSPITAL_BASED_OUTPATIENT_CLINIC_OR_DEPARTMENT_OTHER): Payer: Medicare Other

## 2021-04-13 ENCOUNTER — Emergency Department (HOSPITAL_BASED_OUTPATIENT_CLINIC_OR_DEPARTMENT_OTHER)
Admission: EM | Admit: 2021-04-13 | Discharge: 2021-04-13 | Disposition: A | Discharge status: Home / Self Care | Payer: Medicare Other | Attending: Emergency Medicine | Admitting: Emergency Medicine

## 2021-04-13 DIAGNOSIS — Z20822 Contact with and (suspected) exposure to covid-19: Secondary | ICD-10-CM | POA: Diagnosis not present

## 2021-04-13 DIAGNOSIS — Z7901 Long term (current) use of anticoagulants: Secondary | ICD-10-CM | POA: Diagnosis not present

## 2021-04-13 DIAGNOSIS — Z7951 Long term (current) use of inhaled steroids: Secondary | ICD-10-CM | POA: Diagnosis not present

## 2021-04-13 DIAGNOSIS — I129 Hypertensive chronic kidney disease with stage 1 through stage 4 chronic kidney disease, or unspecified chronic kidney disease: Secondary | ICD-10-CM | POA: Insufficient documentation

## 2021-04-13 DIAGNOSIS — J069 Acute upper respiratory infection, unspecified: Secondary | ICD-10-CM | POA: Insufficient documentation

## 2021-04-13 DIAGNOSIS — N189 Chronic kidney disease, unspecified: Secondary | ICD-10-CM | POA: Insufficient documentation

## 2021-04-13 DIAGNOSIS — R059 Cough, unspecified: Secondary | ICD-10-CM | POA: Diagnosis present

## 2021-04-13 LAB — CBC
HCT: 42.6 % (ref 39.0–52.0)
Hemoglobin: 14.2 g/dL (ref 13.0–17.0)
MCH: 28.2 pg (ref 26.0–34.0)
MCHC: 33.3 g/dL (ref 30.0–36.0)
MCV: 84.5 fL (ref 80.0–100.0)
Platelets: 255 10*3/uL (ref 150–400)
RBC: 5.04 MIL/uL (ref 4.22–5.81)
RDW: 13.6 % (ref 11.5–15.5)
WBC: 11.3 10*3/uL — ABNORMAL HIGH (ref 4.0–10.5)
nRBC: 0 % (ref 0.0–0.2)

## 2021-04-13 LAB — BASIC METABOLIC PANEL
Anion gap: 10 (ref 5–15)
BUN: 12 mg/dL (ref 8–23)
CO2: 25 mmol/L (ref 22–32)
Calcium: 9.3 mg/dL (ref 8.9–10.3)
Chloride: 104 mmol/L (ref 98–111)
Creatinine, Ser: 1.5 mg/dL — ABNORMAL HIGH (ref 0.61–1.24)
GFR, Estimated: 52 mL/min — ABNORMAL LOW (ref >60)
Glucose, Bld: 104 mg/dL — ABNORMAL HIGH (ref 70–99)
Potassium: 3.4 mmol/L — ABNORMAL LOW (ref 3.5–5.1)
Sodium: 139 mmol/L (ref 135–145)

## 2021-04-13 LAB — RESP PANEL BY RT-PCR (FLU A&B, COVID) ARPGX2
Influenza A by PCR: NEGATIVE
Influenza B by PCR: NEGATIVE
SARS Coronavirus 2 by RT PCR: NEGATIVE

## 2021-04-13 LAB — D-DIMER, QUANTITATIVE: D-Dimer, Quant: 0.4 ug/mL-FEU (ref 0.00–0.50)

## 2021-04-13 LAB — TROPONIN I (HIGH SENSITIVITY)
Troponin I (High Sensitivity): 7 ng/L (ref <18)
Troponin I (High Sensitivity): 6 ng/L (ref <18)

## 2021-04-13 NOTE — ED Notes (Signed)
Presents to the ED today with strong congested cough, states has been coughing since Dec. Ambulated to room while monitoring POX, reading at 99% on RA, HR 72/ min consistently.

## 2021-04-13 NOTE — ED Notes (Signed)
Ambulatory Pulse oximetry was 95-97%

## 2021-04-13 NOTE — ED Triage Notes (Signed)
First contact with patient. Patient arrived via triage from home with complaints of cough and shortness of breath x Dec 14 - Patient states his chest started to ache today. Pt is A&OX 4. Respirations even/unlabored - Patient is able to get 3-4 word sentences. Yellow/green sputum reported. Patient changed into gown and placed on cardiac monitor and call light within reach. Patient updated on plan of care.

## 2021-04-13 NOTE — ED Provider Notes (Signed)
[facility] DE[facility] Provider Note   CSN: [id number] Arrival date & time: 04/13/21  2440     History  Chief Complaint  Patient presents with   Cough   Shortness of Breath    [name] is a 61 y.o. male.  Patient is a 60 year old male who presents with cough and chest pain.  He reports that he started getting sick on December 14.  He said shortly before that, his son had flown in from [place] and had a fever and congestion.  He was diagnosed with influenza.  A few days later, the patient got his COVID and flu vaccine.  A few days after that he started getting sick on December 14.  He has had cough and nasal congestion.  His cough is mostly dry but now as productive of some yellow-green sputum.  He has intense coughing attacks.  He has some shortness of breath on exertion and says his legs feel really weak.  He has had some intermittent chest pains.  At times he has pain with coughing.  He also has pain without coughing.  Yesterday it was more right-sided.  Today he has some pain in his left side which she describes as a squeezing type pain.  It comes and goes.  He denies any leg pain or swelling.  He said he had fevers when it first started but has not had fever since then.  He recently saw his PCP and was prescribed some cough medicine a few days ago.  He said at that time he had a COVID and flu test.  The flu test was negative.  COVID test is pending.  Per chart review, patient has a history of hypertension, hyperlipidemia, chronic kidney disease, atrial fibrillation on Eliquis.      Home Medications Prior to Admission medications   Medication Sig Start Date End Date Taking? Authorizing Provider  acetaminophen (TYLENOL) 325 MG tablet Take 650 mg by mouth every 6 (six) hours as needed for moderate pain.    [provider]  amLODipine (NORVASC) 5 MG tablet Take 1 tablet (5 mg total) by mouth daily. 04/30/19 02/15/20  Kayleen Memos, DO  apixaban  (ELIQUIS) 5 MG TABS tablet Take 1 tablet (5 mg total) by mouth 2 (two) times daily. Take Eliquis 5 mg twice a day one week after GI bleed.  Do not restart Xarelto. Patient taking differently: Take 5 mg by mouth 2 (two) times daily.  05/06/19   Kayleen Memos, DO  atorvastatin (LIPITOR) 40 MG tablet TAKE 1 TABLET(40 MG) BY MOUTH DAILY 02/09/21   Isaiah Serge, NP  cetirizine (ZYRTEC) 10 MG tablet Take 10 mg by mouth daily as needed for allergies.     [provider]  Colchicine 0.6 MG CAPS Take 0.6 mg by mouth 2 (two) times daily as needed. Gout flares 01/17/20   [provider]  ferrous sulfate 325 (65 FE) MG tablet Take 1 tablet (325 mg total) by mouth daily with breakfast. 04/30/19 02/15/20  Kayleen Memos, DO  fluticasone (FLONASE) 50 MCG/ACT nasal spray Place 1 spray into both nostrils daily as needed for allergies or rhinitis.    [provider]  Fluticasone-Salmeterol (ADVAIR) 250-50 MCG/DOSE AEPB Inhale 1 puff into the lungs 2 (two) times daily as needed (wheezing).    [provider]  LINZESS 290 MCG CAPS capsule Take 290 mcg by mouth every other day. 10/07/19   [provider]  nitroGLYCERIN (NITROSTAT) 0.4 MG SL  tablet Place 1 tablet (0.4 mg total) under the tongue every 5 (five) minutes as needed for chest pain. 03/12/13   Thurnell Lose, MD  ondansetron (ZOFRAN ODT) 4 MG disintegrating tablet Take 1 tablet (4 mg total) by mouth every 8 (eight) hours as needed for up to 6 doses for nausea or vomiting. 11/27/20   Godfrey Pick, MD  ondansetron (ZOFRAN) 8 MG tablet Take 8 mg by mouth every 8 (eight) hours as needed for nausea or vomiting.  10/20/18   [provider]  oxyCODONE-acetaminophen (PERCOCET/ROXICET) 5-325 MG tablet Take 1 tablet by mouth every 6 (six) hours as needed for moderate pain or severe pain.     [provider]  pantoprazole (PROTONIX) 40 MG tablet Take 40 mg by mouth daily. 07/28/14   [provider]   polyethylene glycol (MIRALAX / GLYCOLAX) 17 g packet Take 17 g by mouth daily. Patient taking differently: Take 17 g by mouth daily as needed for mild constipation.  04/30/19   Kayleen Memos, DO  sildenafil (REVATIO) 20 MG tablet Take 60-100 mg by mouth as directed.  11/25/18   [provider]  sotalol (BETAPACE) 80 MG tablet Take 1 tablet (80 mg total) by mouth every 12 (twelve) hours. 04/29/19 02/15/20  Kayleen Memos, DO  SUMAtriptan (IMITREX) 100 MG tablet Take 100 mg by mouth as directed. Take one tablet at onset, then an additional tablet every 12 hours as needed for migraine 02/04/20   [provider]  traMADol (ULTRAM) 50 MG tablet Take 50 mg by mouth every 6 (six) hours as needed for moderate pain.    [provider]  triamterene-hydrochlorothiazide (DYAZIDE) 37.5-25 MG capsule Take 1 capsule by mouth every morning. 11/27/19   [provider]  gabapentin (NEURONTIN) 300 MG capsule Take 300 mg by mouth at bedtime.  02/26/19  [provider]      Allergies    Patient has no known allergies.    Review of Systems   Review of Systems  Constitutional:  Positive for fatigue. Negative for chills, diaphoresis and fever.  HENT:  Positive for congestion. Negative for rhinorrhea and sneezing.   Eyes: Negative.   Respiratory:  Positive for cough, chest tightness and shortness of breath.   Cardiovascular:  Positive for chest pain. Negative for leg swelling.  Gastrointestinal:  Negative for abdominal pain, blood in stool, diarrhea, nausea and vomiting.  Genitourinary:  Negative for difficulty urinating, flank pain, frequency and hematuria.  Musculoskeletal:  Negative for arthralgias and back pain.  Skin:  Negative for rash.  Neurological:  Negative for dizziness, speech difficulty, weakness, numbness and headaches.   Physical Exam Updated Vital Signs BP 122/81 (BP Location: Right Arm)    Pulse (!) 59    Temp 97.7 F (36.5 C) (Oral)    Resp 18    Ht 5'  11" (1.803 m)    Wt 97.1 kg    SpO2 96%    BMI 29.86 kg/m  Physical Exam Constitutional:      Appearance: He is well-developed.  HENT:     Head: Normocephalic and atraumatic.  Eyes:     Pupils: Pupils are equal, round, and reactive to light.  Cardiovascular:     Rate and Rhythm: Normal rate and regular rhythm.     Heart sounds: Normal heart sounds.  Pulmonary:     Effort: Pulmonary effort is normal. No respiratory distress.     Breath sounds: Normal breath sounds. No wheezing or rales.  Chest:  Chest wall: No tenderness.  Abdominal:     General: Bowel sounds are normal.     Palpations: Abdomen is soft.     Tenderness: There is no abdominal tenderness. There is no guarding or rebound.  Musculoskeletal:        General: Normal range of motion.     Cervical back: Normal range of motion and neck supple.     Comments: No edema or calf tenderness  Lymphadenopathy:     Cervical: No cervical adenopathy.  Skin:    General: Skin is warm and dry.     Findings: No rash.  Neurological:     Mental Status: He is alert and oriented to person, place, and time.    ED Results / Procedures / Treatments   Labs (all labs ordered are listed, but only abnormal results are displayed) Labs Reviewed  BASIC METABOLIC PANEL - Abnormal; Notable for the following components:      Result Value   Potassium 3.4 (*)    Glucose, Bld 104 (*)    Creatinine, Ser 1.50 (*)    GFR, Estimated 52 (*)    All other components within normal limits  CBC - Abnormal; Notable for the following components:   WBC 11.3 (*)    All other components within normal limits  RESP PANEL BY RT-PCR (FLU A&B, COVID) [id number]  D-DIMER, QUANTITATIVE  TROPONIN I (HIGH SENSITIVITY)  TROPONIN I (HIGH SENSITIVITY)    EKG EKG Interpretation  Date/Time:  Friday April 13 2021 09:25:55 EST Ventricular Rate:  66 PR Interval:  210 QRS Duration: 119 QT Interval:  434 QTC Calculation: 455 R Axis:   -74 Text Interpretation: Sinus  rhythm LAD, consider left anterior fascicular block Borderline T abnormalities, inferior leads since last tracing no significant change Confirmed by Malvin Johns [phone]) on 04/13/2021 9:43:18 AM  Radiology DG Chest Portable 1 View  Result Date: 04/13/2021 CLINICAL DATA:  Dyspnea, cough. EXAM: PORTABLE CHEST 1 VIEW COMPARISON:  November 27, 2020. FINDINGS: The heart size and mediastinal contours are within normal limits. Both lungs are clear. The visualized skeletal structures are unremarkable. IMPRESSION: No active disease. Electronically Signed   By: Marijo Conception M.D.   On: 04/13/2021 09:46    Procedures Procedures    Medications Ordered in ED Medications - No data to display  ED Course/ Medical Decision Making/ A&P                           Medical Decision Making  Patient is a 61 year old male who presents with cough and associated chest pain.  His EKG does not show any ischemic changes.  This was interpreted by me.  He had a chest x-ray which showed no evidence of pneumonia or other acute abnormality.  This was interpreted by me as well.  His labs were obtained which showed an elevation in his creatinine.  Although on chart review, it appears to be just a bit elevated as compared to prior values.  His COVID and flu test were negative.  He does not have any wheezing on exam.  No hypoxia.  He was ambulated without hypoxia.  He had a D-dimer which was negative.  He does not have other symptoms that sound more concerning for PE.  No other suggestions of ACS.  He was recently prescribed 2 days Tessalon and Claritin.  He does feel like his cough is somewhat improved with that.  He was discharged home in good condition.  He was encouraged to follow-up with his PCP.  Return precautions were given.  Final Clinical Impression(s) / ED Diagnoses Final diagnoses:  Viral URI with cough    Rx / DC Orders ED Discharge Orders     None         Malvin Johns, MD 04/13/21 1251

## 2021-05-05 ENCOUNTER — Other Ambulatory Visit: Payer: Self-pay | Admitting: Cardiology

## 2021-05-11 ENCOUNTER — Encounter: Payer: Self-pay | Admitting: Physical Medicine and Rehabilitation

## 2021-05-28 ENCOUNTER — Ambulatory Visit
Admission: RE | Admit: 2021-05-28 | Discharge: 2021-05-28 | Disposition: A | Discharge status: Home / Self Care | Payer: Medicare Other | Source: Ambulatory Visit | Attending: Physical Medicine and Rehabilitation | Admitting: Physical Medicine and Rehabilitation

## 2021-05-28 ENCOUNTER — Encounter
Payer: Medicare Other | Attending: Physical Medicine and Rehabilitation | Admitting: Physical Medicine and Rehabilitation

## 2021-05-28 ENCOUNTER — Encounter: Payer: Self-pay | Admitting: Physical Medicine and Rehabilitation

## 2021-05-28 ENCOUNTER — Other Ambulatory Visit: Payer: Self-pay

## 2021-05-28 VITALS — BP 119/83 | HR 70 | Ht 71.0 in | Wt 218.0 lb

## 2021-05-28 DIAGNOSIS — M5010 Cervical disc disorder with radiculopathy, unspecified cervical region: Secondary | ICD-10-CM | POA: Insufficient documentation

## 2021-05-28 DIAGNOSIS — R2231 Localized swelling, mass and lump, right upper limb: Secondary | ICD-10-CM | POA: Insufficient documentation

## 2021-05-28 DIAGNOSIS — M7918 Myalgia, other site: Secondary | ICD-10-CM | POA: Insufficient documentation

## 2021-05-28 DIAGNOSIS — E559 Vitamin D deficiency, unspecified: Secondary | ICD-10-CM | POA: Diagnosis not present

## 2021-05-28 MED ORDER — GABAPENTIN 300 MG PO CAPS: 300.0000 mg | ORAL_CAPSULE | Freq: Every day | ORAL | 5 refills | Status: DC

## 2021-05-28 NOTE — Progress Notes (Signed)
Subjective:    Patient ID: [name], male    DOB: [birth year], 61 y.o.   MRN: [id number]  HPI Pt is a 61 yr old male with hx of SVT, interstitial lung disease, sickle cell syndrome, CKD- last Cr 1.50, Afib; migraine; Cervical and lumbar radiculopathy and OA of knee-as well as fibromyalgia.  Also has depression and HTN; chronic fatigue syndrome - on Eliquis, and gout. 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    Here for chronic pain evaluation.    Chronic neck pain and radiates down RUE to fingers- And into shoulder on L side.   Has pins/needles shooting through arms and occ in legs- stinging in legs- intermittent.   The pain that radiates down R arm- aching, throbbing pain- not burning.   And chest pain. Shoots around him to back - likes catches. Severe cramp    Has a "viral feeling" preceding the pain- Can get 8-9 hours of sleep- wake sup like never slept- still exhausted and head is in fog.     Only takes pain meds when cannot bear it.  Has no percocet left- 5/325 mg - ~2-3x/month- just tries to fight through it.    Tried: Taken off ibuprofen- a couple of years ago due to CKD/Eliquis Gabapentin- helping tingling and stinging, but nothing else.  Tried an anti-depressant a few years ago- made him anxious- doesn't remember the name of it.  Not sure if ever tried Lyrica PT- Shoulder pain and lumbar pain-   Tried multiple muscle relaxants- helped some, - made sleepy.  Has had epidural injections in cervical/lumbar spine- were helpful- only did once.   Props with pillows around him-  To get more comfortable   No HEP that he does regularly-  Has membership to gym- tries to walk track and light weights 2x/week. If overexerts, pain much worse.    Last 3 years, has slowed down quite a bit- exercise wise because had 2 ablations- and prostate trimming per pt.   MRI lumbar 07/27/19.  IMPRESSION: 1. Lumbar spondylosis and degenerative disc  disease, causing prominent impingement at L4-5; moderate impingement at L3-4; and mild impingement at T12-L1, L1-2, and L2-3. Aside from the L3-4 level, the impingement at the other levels is increased from the prior exam. 2. Degenerative facet edema and enhancement at L3-4 and L4-5.   Allergies: NKDA  Pain Inventory Average Pain 7 Pain Right Now 6 My pain is constant, sharp, dull, stabbing, tingling, and aching  In the last 24 hours, has pain interfered with the following? General activity 7 Relation with others 5 Enjoyment of life 4 What TIME of day is your pain at its worst? morning , daytime, evening, and night Sleep (in general) Fair  Pain is worse with: walking, bending, and some activites Pain improves with: rest Relief from Meds:  na  walk without assistance how many minutes can you walk? Not sure ability to climb steps?  yes do you drive?  yes  employed # of hrs/week 10 hrs disabled: date disabled 06/20/1993 I need assistance with the following:  household duties  weakness numbness tingling trouble walking spasms dizziness  New pt  New pt    Family History  Problem Relation Age of Onset   CAD Mother        died in her 23's - MI   Multiple myeloma Brother    Sickle cell anemia Father        died @ 21   Gastric cancer  Neg Hx    Social History   Socioeconomic History   Marital status: Divorced    Spouse name: Not on file   Number of children: Not on file   Years of education: Not on file   Highest education level: Not on file  Occupational History   Not on file  Tobacco Use   Smoking status: Never   Smokeless tobacco: Never  Vaping Use   Vaping Use: Never used  Substance and Sexual Activity   Alcohol use: No   Drug use: Never   Sexual activity: Not on file  Other Topics Concern   Not on file  Social History Narrative   Lives in [place] with dtr.   Social Determinants of Health   Financial Resource Strain: Not on file  Food  Insecurity: Not on file  Transportation Needs: Not on file  Physical Activity: Not on file  Stress: Not on file  Social Connections: Not on file   Past Surgical History:  Procedure Laterality Date   ATRIAL FIBRILLATION ABLATION     BIOPSY  08/27/2018   Procedure: BIOPSY;  Surgeon: Tressia Danas, MD;  Location: [facility] ENDOSCOPY;  Service: Gastroenterology;;   BIOPSY  08/28/2018   Procedure: BIOPSY;  Surgeon: Tressia Danas, MD;  Location: [facility] ENDOSCOPY;  Service: Gastroenterology;;   COLONOSCOPY WITH PROPOFOL N/A 08/28/2018   Procedure: COLONOSCOPY WITH PROPOFOL;  Surgeon: Tressia Danas, MD;  Location[facility] ENDOSCOPY;  Service: Gastroenterology;  Laterality: N/A;   ENTEROSCOPY N/A 08/27/2018   Procedure: ENTEROSCOPY;  Surgeon: Tressia Danas, MD;  Location: [facility] ENDOSCOPY;  Service: Gastroenterology;  Laterality: N/A;   GIVENS CAPSULE STUDY N/A 08/28/2018   Procedure: GIVENS CAPSULE STUDY;  Surgeon: Tressia Danas, MD;  Location: [facility] ENDOSCOPY;  Service: Gastroenterology;  Laterality: N/A;   LEFT HEART CATHETERIZATION WITH CORONARY ANGIOGRAM N/A 03/11/2013   Procedure: LEFT HEART CATHETERIZATION WITH CORONARY ANGIOGRAM;  Surgeon: Dolores Patty, MD;  Location: [facility] CATH LAB;  Service: Cardiovascular;  Laterality: N/A;   POLYPECTOMY  08/28/2018   Procedure: POLYPECTOMY;  Surgeon: Tressia Danas, MD;  Location: [facility] ENDOSCOPY;  Service: Gastroenterology;;   TONSILLECTOMY     TRANSURETHRAL RESECTION OF PROSTATE     Past Medical History:  Diagnosis Date   A-fib (HCC)    Chest pain    a. 11/2002 Cath: EF 68%, LM nl, LAD small, nl, LCX nl, RCA tortuous/nl;  b. 08/2011 St Echo: EF 60%, normal contractility, no scar/ischemia.   COPD (chronic obstructive pulmonary disease) (HCC)    Enlarged prostate    GERD (gastroesophageal reflux disease)    GI bleeding 08/2018   Hypertension    Sickle cell trait (HCC)    Sleep apnea    SVT (supraventricular tachycardia) (HCC)    BP 119/83    Pulse  70    Ht 5\' 11"  (1.803 m)    Wt 218 lb (98.9 kg)    SpO2 95%    BMI 30.40 kg/m   Opioid Risk Score:   Fall Risk Score:  `1  Depression screen PHQ 2/9  Depression screen PHQ 2/9 05/28/2021  Decreased Interest 0  Down, Depressed, Hopeless 0  PHQ - 2 Score 0  Altered sleeping 1  Tired, decreased energy 3  Change in appetite 0  Feeling bad or failure about yourself  0  Trouble concentrating 0  Moving slowly or fidgety/restless 0  Suicidal thoughts 0  PHQ-9 Score 4  Difficult doing work/chores Not difficult at all     Review of Systems  Respiratory:  Positive for cough, shortness of breath and wheezing.   Gastrointestinal:  Positive for abdominal pain and constipation.  Musculoskeletal:  Positive for back pain and neck pain.       Bilateral arm, shoulder, thigh pain spasms  Neurological:  Positive for dizziness, weakness and numbness.  All other systems reviewed and are negative.     Objective:   Physical Exam  Awake, alert, mild word finding deficits; very disciplined in his speech; NAD MS: RUE 5/5 but caused pain to do exam LUE- 5/5 except 5-/5 in WE, grip and FA on L RLE- 5/5 in HF, KE, KF DF and PF LLE- 5/5 except DF/PF 5-/5 Bony protuberance that's fixed on R hand- between [employer] and 2nd digit Trigger points in B/L upper traps- scalenes, splenius capitus, levators and rhomboids esp pecs B/L; B/L cervical and lumbar paraspinals as well.  Neuro: DTRs 1+ B/L in biceps and patellae B/L Sensation intact in arms and legs b/l      Assessment & Plan:   Pt is a 61 yr old male with hx of SVT, interstitial lung disease, sickle cell syndrome, CKD- last Cr 1.50, Afib; migraine; Cervical and lumbar radiculopathy and OA of knee-as well as fibromyalgia.  Also has depression and HTN; chronic fatigue syndrome - on Eliquis, and gout. 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  Also needs L THR- leg will just give out.   Check Vit D level- on 50k weekly- and  hasn't been checked in >6 months per computer.  Goal ~ 60 for goal for better treatment.   2.  MRI of cervical spine- hasn't been done in years- radiculopathy symptoms so meets criteria to look-   3. R hand xray due to bony prominence just  medical to [facility]- [facility] [place]. - can walk in.   4. Educated that nerve pain worse with cold or heat. Can use gabapentin- 300 mg nightly as needed  5.  Magnesium 400 mg 1 tab/daily- can work up to 2 or 3 tabs/daily- only side effect is looser stools.  Natural muscle relaxant. Doesn't work well as needed. Don't take within 1 hour of iron pills.   6.  Theracane- on amazon/online- or go to [employer]- yoga section- you tube has great videos on how to use- not massage- holding pressure 2-4 minutes on each spot that's painful-   7. Tennis ball- 2-4 minutes of pressure on back of thighs/buttocks and low back.  Same technique as theracane.   8. Will get back for trigger point injections. Has a lot of myofascial pain.    9. Need opiate contract and urine drug screen- if everything OK, can write for pain meds. Took percocet 5/325 mg . Will wait on for now.   10.  Can use tumeric 500 mg 2x/day. Needs ot take regularly for it to be effective.   11. F/U- wait list for TrP injections and 3 months f/u.   I spent a total of 67   minutes on total care today- >50% coordination of care- due to prolonged appt and education as detailed above.

## 2021-05-28 NOTE — Patient Instructions (Signed)
Pt is a 61 yr old male with hx of SVT, interstitial lung disease, sickle cell syndrome, CKD- last Cr 1.50, Afib; migraine; Cervical and lumbar radiculopathy and OA of knee-as well as fibromyalgia.  Also has depression and HTN; chronic fatigue syndrome - on Eliquis, and gout. 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  Also needs L THR- leg will just give out.   Check Vit D level- on 50k weekly- and hasn't been checked in >6 months per computer.  Goal ~ 60 for goal for better treatment.   2.  MRI of cervical spine- hasn't been done in years- radiculopathy symptoms so meets criteria to look-   3. R hand xray due to bony prominence just  medical to [place] joint- [place] [facility]- [place]. - can walk in.   4. Educated that nerve pain worse with cold or heat. Can use gabapentin- 300 mg nightly as needed  5.  Magnesium 400 mg 1 tab/daily- can work up to 2 or 3 tabs/daily- only side effect is looser stools.  Natural muscle relaxant. Doesn't work well as needed. Don't take within 1 hour of iron pills.   6.  Theracane- on amazon/online- or go to [employer]- yoga section- you tube has great videos on how to use- not massage- holding pressure 2-4 minutes on each spot that's painful-   7. Tennis ball- 2-4 minutes of pressure on back of thighs/buttocks and low back.  Same technique as theracane.   8. Will get back for trigger point injections. Has a lot of myofascial pain.    9. Need opiate contract and urine drug screen- if everything OK, can write for pain meds. Took percocet 5/325 mg . Will wait on for now.   10.  Can use tumeric 500 mg 2x/day. Needs ot take regularly for it to be effective.   11. F/U- wait list for TrP injections and 3 months f/u.

## 2021-05-29 ENCOUNTER — Ambulatory Visit: Payer: Medicare Other | Admitting: Physical Medicine and Rehabilitation

## 2021-05-29 LAB — VITAMIN D 25 HYDROXY (VIT D DEFICIENCY, FRACTURES): Vit D, 25-Hydroxy: 44.4 ng/mL (ref 30.0–100.0)

## 2021-06-13 ENCOUNTER — Ambulatory Visit
Admission: RE | Admit: 2021-06-13 | Discharge: 2021-06-13 | Disposition: A | Discharge status: Home / Self Care | Payer: Medicare Other | Source: Ambulatory Visit | Attending: Physical Medicine and Rehabilitation | Admitting: Physical Medicine and Rehabilitation

## 2021-06-13 ENCOUNTER — Other Ambulatory Visit: Payer: Self-pay

## 2021-06-13 DIAGNOSIS — M5010 Cervical disc disorder with radiculopathy, unspecified cervical region: Secondary | ICD-10-CM

## 2021-07-09 ENCOUNTER — Telehealth: Payer: Self-pay | Admitting: *Deleted

## 2021-07-09 DIAGNOSIS — M4712 Other spondylosis with myelopathy, cervical region: Secondary | ICD-10-CM

## 2021-07-09 MED ORDER — GABAPENTIN 300 MG PO CAPS: 300.0000 mg | ORAL_CAPSULE | Freq: Four times a day (QID) | ORAL | 5 refills | Status: DC

## 2021-07-09 NOTE — Telephone Encounter (Signed)
I have let the patient know. 

## 2021-07-09 NOTE — Telephone Encounter (Signed)
Will send to Neurosrgery since has muliple places in neck that concern me.  ? ?C2-C3: Mild disc bulge with endplate and uncovertebral spurring. ?Mild left greater than right facet hypertrophy. No significant ?spinal stenosis. Moderate right worse than left C3 foraminal ?narrowing. ?  ?C3-C4: Moderate intervertebral disc space narrowing with diffuse ?disc osteophyte complex. Broad posterior component flattens and ?partially faces the ventral thecal sac, asymmetric to the right. ?Mild cord flattening without cord signal changes. Mild spinal ?stenosis. Moderate right with mild left C4 foraminal stenosis. ?  ?C4-C5: Mild intervertebral disc space narrowing with diffuse disc ?osteophyte complex. Broad posterior component effaces the ventral ?thecal sac. Associated cord flattening without cord signal changes. ?Superimposed mild facet hypertrophy. Resultant severe spinal ?stenosis with the thecal sac measuring 5 mm in AP diameter at its ?most narrow point. Severe right with moderate left C5 foraminal ?stenosis. ?  ?C5-C6: Moderate intervertebral disc space narrowing with diffuse ?disc osteophyte complex. Flattening and partial effacement of the ?ventral thecal sac, eccentric to the right. Mild cord flattening ?without cord signal changes. Moderate spinal stenosis. Right worse ?than left uncovertebral spurring with resultant severe right worse ?than left C6 foraminal narrowing. ?  ?C6-C7: Mild intervertebral disc space narrowing with diffuse disc ?bulge and uncovertebral spurring. No spinal stenosis. Moderate left ?with mild to moderate right C7 foraminal narrowing. ?  ?C7-T1: Minimal left eccentric disc bulge. Left greater than right ?facet hypertrophy. No spinal stenosis. Foramina remain patent. ?  ?T1-2: Disc bulge with prominent anterior endplate spurring. ?Bilateral facet hypertrophy. No spinal stenosis. Moderate to severe ?bilateral foraminal narrowing. ?  ?Basically, I think he needs surgery on his neck- will place  [facility] referral.  ?Also will increase gabapentin to 300 mg 4x/day for nerve pain ?

## 2021-07-09 NOTE — Telephone Encounter (Signed)
Having a lo of pain. He says it is excruciating from neck down to right arm. Wakes up with pain and it goes into his back and chest.  He is not due to be seen until 09/07/21.  Note mentioned getting a UDS and CSA but that was postponed.  Please advise. ?

## 2021-07-27 ENCOUNTER — Other Ambulatory Visit: Payer: Self-pay | Admitting: Neurosurgery

## 2021-08-03 ENCOUNTER — Encounter (HOSPITAL_COMMUNITY): Payer: Self-pay

## 2021-08-03 NOTE — Progress Notes (Signed)
Surgical Instructions ? ? ? Your procedure is scheduled on 08/10/21. ? Report to [facility] Main Entrance "A" at 6:00 A.M., then check in with the Admitting office. ? Call this number if you have problems the morning of surgery: ? [phone] ? ? If you have any questions prior to your surgery date call [phone]: Open Monday-Friday 8am-4pm ? ? ? Remember: ? Do not eat after midnight the night before your surgery ? ?You may drink clear liquids until 5:00 the morning of your surgery.   ?Clear liquids allowed are: Water, Non-Citrus Juices (without pulp), Carbonated Beverages, Clear Tea, Black Coffee ONLY (NO MILK, CREAM OR POWDERED CREAMER of any kind), and Gatorade ?  ? Take these medicines the morning of surgery with A SIP OF WATER:  ?amLODipine (NORVASC) ?isosorbide mononitrate (IMDUR)  ?pantoprazole (PROTONIX) ?sotalol (BETAPACE)  ? ?AS NEEDED: ?acetaminophen (TYLENOL) ?cetirizine (ZYRTEC)  ?Colchicine  ?cyclobenzaprine (FLEXERIL) ?famotidine (PEPCID) ?fluticasone (FLONASE) ?Fluticasone-Salmeterol (ADVAIR)  ?gabapentin (NEURONTIN) ?guaiFENesin [facility])  ?nitroGLYCERIN (NITROSTAT) ?ondansetron [facility]) ?SUMAtriptan (IMITREX)  ?traMADol [facility])  ? ?Follow your surgeon's instructions on when to stop .  If no iapixaban [facility]) If no instructions were given by your surgeon then you will need to call the office to get those instructions.    ? ? ?As of today, STOP taking any Aspirin (unless otherwise instructed by your surgeon) diclofenac (VOLTAREN), Aleve, Naproxen, Ibuprofen, Motrin, Advil, Goody's, BC's, all herbal medications, fish oil, and all vitamins. ? ?         ?Do not wear jewelry  ?Do not wear lotions, powders, colognes, or deodorant. ?Do not shave 48 hours prior to surgery.  Men may shave face and neck. ?Do not bring valuables to the hospital. ? ? ?[facility] is not responsible for any belongings or valuables. .  ? ?Do NOT Smoke (Tobacco/Vaping)  24 hours prior to your procedure ? ?If you use a CPAP at  night, you may bring your mask for your overnight stay. ?  ?Contacts, glasses, hearing aids, dentures or partials may not be worn into surgery, please bring cases for these belongings ?  ?For patients admitted to the hospital, discharge time will be determined by your treatment team. ?  ?Patients discharged the day of surgery will not be allowed to drive home, and someone needs to stay with them for 24 hours. ? ? ?SURGICAL WAITING ROOM VISITATION ?Patients having surgery or a procedure in a hospital may have two support people. ?Children under the age of 92 must have an adult with them who is not the patient. ?They may stay in the waiting area during the procedure and may switch out with other visitors. If the patient needs to stay at the hospital during part of their recovery, the visitor guidelines for inpatient rooms apply. ? ?Please refer to the [place] website for the visitor guidelines for Inpatients (after your surgery is over and you are in a regular room).  ? ? ? ?Special instructions:   ? ?Oral Hygiene is also important to reduce your risk of infection.  Remember - BRUSH YOUR TEETH THE MORNING OF SURGERY WITH YOUR REGULAR TOOTHPASTE ? ? ?[facility]- Preparing For Surgery ? ?Before surgery, you can play an important role. Because skin is not sterile, your skin needs to be as free of germs as possible. You can reduce the number of germs on your skin by washing with CHG (chlorahexidine gluconate) Soap before surgery.  CHG is an antiseptic cleaner which kills germs and bonds with the skin to  continue killing germs even after washing.   ? ? ?Please do not use if you have an allergy to CHG or antibacterial soaps. If your skin becomes reddened/irritated stop using the CHG.  ?Do not shave (including legs and underarms) for at least 48 hours prior to first CHG shower. It is OK to shave your face. ? ?Please follow these instructions carefully. ?  ? ? Shower the NIGHT BEFORE SURGERY and the MORNING OF SURGERY  with CHG Soap.  ? If you chose to wash your hair, wash your hair first as usual with your normal shampoo. After you shampoo, rinse your hair and body thoroughly to remove the shampoo.  Then [employer] and genitals (private parts) with your normal soap and rinse thoroughly to remove soap. ? ?After that Use CHG Soap as you would any other liquid soap. You can apply CHG directly to the skin and wash gently with a scrungie or a clean washcloth.  ? ?Apply the CHG Soap to your body ONLY FROM THE NECK DOWN.  Do not use on open wounds or open sores. Avoid contact with your eyes, ears, mouth and genitals (private parts). Wash Face and genitals (private parts)  with your normal soap.  ? ?Wash thoroughly, paying special attention to the area where your surgery will be performed. ? ?Thoroughly rinse your body with warm water from the neck down. ? ?DO NOT shower/wash with your normal soap after using and rinsing off the CHG Soap. ? ?Pat yourself dry with a CLEAN TOWEL. ? ?Wear CLEAN PAJAMAS to bed the night before surgery ? ?Place CLEAN SHEETS on your bed the night before your surgery ? ?DO NOT SLEEP WITH PETS. ? ? ?Day of Surgery: ? ?Take a shower with CHG soap. ?Wear Clean/Comfortable clothing the morning of surgery ?Do not apply any deodorants/lotions.   ?Remember to brush your teeth WITH YOUR REGULAR TOOTHPASTE. ? ? ? ?If you received a COVID test during your pre-op visit, it is requested that you wear a mask when out in public, stay away from anyone that may not be feeling well, and notify your surgeon if you develop symptoms. If you have been in contact with anyone that has tested positive in the last 10 days, please notify your surgeon. ? ?  ?Please read over the following fact sheets that you were given.   ?

## 2021-08-06 ENCOUNTER — Encounter (HOSPITAL_COMMUNITY): Payer: Self-pay

## 2021-08-06 ENCOUNTER — Encounter (HOSPITAL_COMMUNITY)
Admission: RE | Admit: 2021-08-06 | Discharge: 2021-08-06 | Disposition: A | Discharge status: Home / Self Care | Payer: Medicare Other | Source: Ambulatory Visit | Attending: Neurosurgery | Admitting: Neurosurgery

## 2021-08-06 ENCOUNTER — Other Ambulatory Visit: Payer: Self-pay

## 2021-08-06 VITALS — BP 139/98 | HR 58 | Temp 97.6°F | Resp 18 | Ht 71.0 in | Wt 217.0 lb

## 2021-08-06 DIAGNOSIS — R0789 Other chest pain: Secondary | ICD-10-CM | POA: Diagnosis not present

## 2021-08-06 DIAGNOSIS — G4733 Obstructive sleep apnea (adult) (pediatric): Secondary | ICD-10-CM | POA: Insufficient documentation

## 2021-08-06 DIAGNOSIS — J449 Chronic obstructive pulmonary disease, unspecified: Secondary | ICD-10-CM | POA: Insufficient documentation

## 2021-08-06 DIAGNOSIS — Z9989 Dependence on other enabling machines and devices: Secondary | ICD-10-CM | POA: Diagnosis not present

## 2021-08-06 DIAGNOSIS — Z7901 Long term (current) use of anticoagulants: Secondary | ICD-10-CM | POA: Insufficient documentation

## 2021-08-06 DIAGNOSIS — I272 Pulmonary hypertension, unspecified: Secondary | ICD-10-CM | POA: Insufficient documentation

## 2021-08-06 DIAGNOSIS — I480 Paroxysmal atrial fibrillation: Secondary | ICD-10-CM | POA: Insufficient documentation

## 2021-08-06 DIAGNOSIS — Z01818 Encounter for other preprocedural examination: Secondary | ICD-10-CM

## 2021-08-06 DIAGNOSIS — Z01812 Encounter for preprocedural laboratory examination: Secondary | ICD-10-CM | POA: Diagnosis not present

## 2021-08-06 DIAGNOSIS — I517 Cardiomegaly: Secondary | ICD-10-CM | POA: Diagnosis not present

## 2021-08-06 DIAGNOSIS — I471 Supraventricular tachycardia: Secondary | ICD-10-CM | POA: Insufficient documentation

## 2021-08-06 HISTORY — DX: Cardiac arrhythmia, unspecified: I49.9

## 2021-08-06 LAB — TYPE AND SCREEN
ABO/RH(D): O POS
Antibody Screen: NEGATIVE

## 2021-08-06 LAB — SURGICAL PCR SCREEN
MRSA, PCR: NEGATIVE
Staphylococcus aureus: NEGATIVE

## 2021-08-06 LAB — BASIC METABOLIC PANEL
Anion gap: 5 (ref 5–15)
BUN: 10 mg/dL (ref 8–23)
CO2: 28 mmol/L (ref 22–32)
Calcium: 9.3 mg/dL (ref 8.9–10.3)
Chloride: 105 mmol/L (ref 98–111)
Creatinine, Ser: 1.44 mg/dL — ABNORMAL HIGH (ref 0.61–1.24)
GFR, Estimated: 55 mL/min — ABNORMAL LOW (ref >60)
Glucose, Bld: 98 mg/dL (ref 70–99)
Potassium: 3.1 mmol/L — ABNORMAL LOW (ref 3.5–5.1)
Sodium: 138 mmol/L (ref 135–145)

## 2021-08-06 NOTE — Progress Notes (Signed)
PCP:  Horald Pollen, MD ?Cardiologist:  Ralene Bathe, MD ? ?EKG:  02/11/22 ?CXR: 06/26/21 CE ?ECHO:  04/27/19 ?Stress Test:  08/21/11 ?Cardiac Cath:  08/23/20 CE ? ?Fasting Blood Sugar-  na ?Checks Blood Sugar__na_ times a day ? ?OSA/CPAP: Yes, wears some nights ? ?ASA: No ?Eliquis: Last dose 08/07/21 per patient ? ?Covid test not needed ? ?Anesthesia Review: Yes, cardiac history ? ?Patient denies shortness of breath, fever, cough, and chest pain at PAT appointment. ? ?Patient verbalized understanding of instructions provided today at the PAT appointment.  Patient asked to review instructions at home and day of surgery.   ?

## 2021-08-07 NOTE — Progress Notes (Signed)
Anesthesia Chart Review: ? ?Follows with cardiology at [facility] for history of PAF s/p RFA PVI x2 (2018 and 2021) maintained on sotalol and Eliquis, SVT, LVH, pulmonary hypertension, chest pain syndrome.  Cath 08/23/2020 showed widely patent coronaries but sluggish flow which was felt to possibly indicate microvascular disease.  Last seen 04/24/2021, stable cardiac standpoint, no changes made to management.  Recommended 31-monthfollow-up.  Preop clearance dated 07/19/2021 states patient is moderate risk, may hold Eliquis 3 days.  Copy on chart. ? ?Patient reports last dose Eliquis 08/07/2021. ? ?History of COPD maintained on Advair. ? ?OSA on CPAP with variable compliance. ? ?CBC 07/20/2021 in Care Everywhere reviewed, WNL. ? ?Preop labs reviewed, potassium mildly low at 3.1, creatinine mildly elevated 1.44, otherwise unremarkable. ? ?EKG 07/06/2021 (Care Everywhere): Sinus bradycardia with first-degree AV block.  Rate 53.  LAD.  Nonspecific intraventricular block.  Possible LVH.  T wave abnormality, consider inferolateral ischemia.  No significant change compared with prior. ? ?TTE 09/05/2020 (Care Everywhere): ?SUMMARY  ?The left ventricular size is normal.  ?Mild left ventricular hypertrophy  ?Left ventricular systolic function is normal.  ?The left atrial size is normal.  ?There is aortic valve sclerosis.  ?There is no pericardial effusion.  ?The right ventricle is normal in size and function.   ? ?Cath 08/23/2020 (Care Everywhere): ?Findings:  ?--LM: angiographically normal  ?--LAD: minimal luminal irregularities w/ TIMI II flow  ?--LCx: angiographically normal  ?--RI: small vessel that is angiographically normal  ?--RCA: inferior take-off that required MP catheter to engage; minimal  ?luminal irregularities of PDA with TIMI II flow  ?--LVEDP: AV not crossed  ? ?Comments: widely patent coronaries; sluggish flow may indicated  ?microvasculature disase  ? ? ? ?JKaroline Caldwell PA-C ?[facility] ?Phone  [phone]?08/07/2021 1:00 PM ? ?

## 2021-08-07 NOTE — Anesthesia Preprocedure Evaluation (Addendum)
Anesthesia Evaluation  ?Patient identified by MRN, date of birth, ID band ?Patient awake ? ? ? ?Reviewed: ?Allergy & Precautions, NPO status , Patient's Chart, lab work & pertinent test results ? ?Airway ?Mallampati: I ? ?TM Distance: >3 FB ?Neck ROM: Limited ? ? ? Dental ? ?(+) Missing,  ?  ?Pulmonary ?sleep apnea , COPD,  ?  ?breath sounds clear to auscultation ? ? ? ? ? ? Cardiovascular ?hypertension, Pt. on medications ?+ angina + dysrhythmias Atrial Fibrillation  ?Rhythm:Regular Rate:Normal ? ? ?  ?Neuro/Psych ? Neuromuscular disease negative psych ROS  ? GI/Hepatic ?Neg liver ROS, GERD  Medicated,  ?Endo/Other  ?negative endocrine ROS ? Renal/GU ?Renal disease  ? ?  ?Musculoskeletal ?negative musculoskeletal ROS ?(+)  ? Abdominal ?Normal abdominal exam  (+)   ?Peds ? Hematology ? ?(+) Blood dyscrasia, Sickle cell trait ,   ?Anesthesia Other Findings ? ? Reproductive/Obstetrics ? ?  ? ? ? ? ? ? ? ? ? ? ? ? ? ?  ?  ? ? ? ? ? ? ?Anesthesia Physical ?Anesthesia Plan ? ?ASA: 3 ? ?Anesthesia Plan: General  ? ?Post-op Pain Management:   ? ?Induction: Intravenous ? ?PONV Risk Score and Plan: 3 and Ondansetron, Dexamethasone and Midazolam ? ?Airway Management Planned: Oral ETT and Video Laryngoscope Planned ? ?Additional Equipment: None ? ?Intra-op Plan:  ? ?Post-operative Plan: Extubation in OR ? ?Informed Consent: I have reviewed the patients History and Physical, chart, labs and discussed the procedure including the risks, benefits and alternatives for the proposed anesthesia with the patient or authorized representative who has indicated his/her understanding and acceptance.  ? ? ? ?Dental advisory given ? ?Plan Discussed with: CRNA ? ?Anesthesia Plan Comments: (PAT note by Karoline Caldwell, PA-C: ?Follows with cardiology at [facility] for history of PAF s/p RFA PVI x2 (2018 and 2021) maintained on sotalol and Eliquis, SVT, LVH, pulmonary hypertension, chest pain syndrome.  Cath 08/23/2020  showed widely patent coronaries but sluggish flow which was felt to possibly indicate microvascular disease.  Last seen 04/24/2021, stable cardiac standpoint, no changes made to management.  Recommended 26-monthfollow-up.  Preop clearance dated 07/19/2021 states patient is moderate risk, may hold Eliquis 3 days.  Copy on chart. ? ?Patient reports last dose Eliquis 08/07/2021. ? ?History of COPD maintained on Advair. ? ?OSA on CPAP with variable compliance. ? ?CBC 07/20/2021 in Care Everywhere reviewed, WNL. ? ?Preop labs reviewed, potassium mildly low at 3.1, creatinine mildly elevated 1.44, otherwise unremarkable. ? ?EKG 07/06/2021 (Care Everywhere): Sinus bradycardia with first-degree AV block.  Rate 53.  LAD.  Nonspecific intraventricular block.  Possible LVH.  T wave abnormality, consider inferolateral ischemia.  No significant change compared with prior. ? ?TTE 09/05/2020 (Care Everywhere): ?SUMMARY  ?The left ventricular size is normal.  ?Mild left ventricular hypertrophy  ?Left ventricular systolic function is normal.  ?The left atrial size is normal.  ?There is aortic valve sclerosis.  ?There is no pericardial effusion.  ?The right ventricle is normal in size and function. ? ? ?Cath 08/23/2020 (Care Everywhere): ?Findings:  ?--LM: angiographically normal  ?--LAD: minimal luminal irregularities w/ TIMI II flow  ?--LCx: angiographically normal  ?--RI: small vessel that is angiographically normal  ?--RCA: inferior take-off that required MP catheter to engage; minimal  ?luminal irregularities of PDA with TIMI II flow  ?--LVEDP: AV not crossed  ? ?Comments: widely patent coronaries; sluggish flow may indicated  ?microvasculature disase  ?)  ? ? ? ? ? ?Anesthesia Quick Evaluation ? ?

## 2021-08-08 ENCOUNTER — Ambulatory Visit (HOSPITAL_BASED_OUTPATIENT_CLINIC_OR_DEPARTMENT_OTHER): Payer: Medicare Other | Admitting: Cardiology

## 2021-08-08 VITALS — BP 132/96 | HR 51 | Ht 71.0 in | Wt 218.5 lb

## 2021-08-08 DIAGNOSIS — Z7189 Other specified counseling: Secondary | ICD-10-CM

## 2021-08-08 DIAGNOSIS — E7800 Pure hypercholesterolemia, unspecified: Secondary | ICD-10-CM

## 2021-08-08 DIAGNOSIS — I10 Essential (primary) hypertension: Secondary | ICD-10-CM

## 2021-08-08 DIAGNOSIS — I2510 Atherosclerotic heart disease of native coronary artery without angina pectoris: Secondary | ICD-10-CM

## 2021-08-08 DIAGNOSIS — I480 Paroxysmal atrial fibrillation: Secondary | ICD-10-CM | POA: Diagnosis not present

## 2021-08-08 DIAGNOSIS — I2589 Other forms of chronic ischemic heart disease: Secondary | ICD-10-CM

## 2021-08-08 NOTE — Patient Instructions (Addendum)
Medication Instructions:  ?Your physician recommends that you continue on your current medications as directed. Please refer to the Current Medication list given to you today.  ? ?Labwork: ?NONE ? ?Testing/Procedures: ?NONE  ? ?Follow-Up: ?AS NEEDED  ? ?Consider KardiaMobile to check your rhythm. ?

## 2021-08-08 NOTE — Progress Notes (Signed)
?Cardiology Office Note:   ? ?Date:  08/14/2021  ? ?ID:  [name], DOB 1960/09/09, MRN [id number] ? ?PCP:  [name], MD  ?Cardiologist:  Moss Mc, MD ? ?Referring MD: [name], MD  ? ?CC: follow up ? ?History of Present Illness:   ? ?[name] is a 61 y.o. male with a hx of paroxysmal atrial fibrillation/atrial tachycardia s/p ablation (followed at [facility]), TIA, hypertension, COPD who is seen for hospital follow up today. I met him during his admission 02/15/20 for chest pain. 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  ? ?Today: ?Has upcoming surgery 08/10/21 with Dr. Annette Stable for neck surgery (C3-C6), has received preop clearance from his EP team at [facility]. He is currently holding apixaban in anticipation of surgery. ? ?Has been noticing more irregular heart beats. Yesterday had an episode lasting 1-2 minutes of irregular beats, felt flushed, woozy, had to lay down. No vertigo. He was putting a bed together at the time. Felt off for a little while but symptoms did not recur. Feels very fatigued today. Has noticed more of these events than usual. ? ?Checks blood pressure at home. Typically well controlled, lower than office numbers. In pain today. ? ?Reviewed cath, echo from [facility] last year. On imdur and amlodipine. Reviewed finding of sluggish flow. ? ?Two brothers, one 68 and one 79, no known heart disease, died in their sleep. Mother died age 45 in church, sudden but unknown etiology. ? ?Denies chest pain, shortness of breath at rest or with normal exertion. No PND, orthopnea, LE edema or unexpected weight gain. No syncope. ? ?Past Medical History:  ?Diagnosis Date  ? A-fib ([place])   ? Chest pain   ? a. 11/2002 Cath: EF 68%, LM nl, LAD small, nl, LCX nl, RCA tortuous/nl;  b. 08/2011 St Echo: EF 60%, normal contractility, no scar/ischemia.  ? COPD (chronic obstructive pulmonary disease) ([place])   ? Dysrhythmia   ? Enlarged prostate   ? GERD (gastroesophageal reflux disease)   ? GI  bleeding 08/2018  ? Hypertension   ? Sickle cell trait ([place])   ? Sleep apnea   ? SVT (supraventricular tachycardia) ([place])   ? ? ?Past Surgical History:  ?Procedure Laterality Date  ? ANTERIOR CERVICAL DECOMP/DISCECTOMY FUSION N/A 08/10/2021  ? Procedure: CERVICAL THREE-FOUR, CERVICAL FOUR-FIVE, CERVICAL FIVE-SIX ANTERIOR CERVICAL DECOMPRESSION/DISCECTOMY FUSION;  Surgeon: Earnie Larsson, MD;  Location: [place];  Service: Neurosurgery;  Laterality: N/A;  3C/RM 20  ? ATRIAL FIBRILLATION ABLATION    ? BIOPSY  08/27/2018  ? Procedure: BIOPSY;  Surgeon: Thornton Park, MD;  Location: [place];  Service: Gastroenterology;;  ? BIOPSY  08/28/2018  ? Procedure: BIOPSY;  Surgeon: Thornton Park, MD;  Location: [place];  Service: Gastroenterology;;  ? COLONOSCOPY WITH PROPOFOL N/A 08/28/2018  ? Procedure: COLONOSCOPY WITH PROPOFOL;  Surgeon: Thornton Park, MD;  Location: [place];  Service: Gastroenterology;  Laterality: N/A;  ? ENTEROSCOPY N/A 08/27/2018  ? Procedure: ENTEROSCOPY;  Surgeon: Thornton Park, MD;  Location: [place];  Service: Gastroenterology;  Laterality: N/A;  ? GIVENS CAPSULE STUDY N/A 08/28/2018  ? Procedure: GIVENS CAPSULE STUDY;  Surgeon: Thornton Park, MD;  Location: [place];  Service: Gastroenterology;  Laterality: N/A;  ? LEFT HEART CATHETERIZATION WITH CORONARY ANGIOGRAM N/A 03/11/2013  ? Procedure: LEFT HEART CATHETERIZATION WITH CORONARY ANGIOGRAM;  Surgeon: Jolaine Artist, MD;  Location: [facility] CATH LAB;  Service: Cardiovascular;  Laterality: N/A;  ? POLYPECTOMY  08/28/2018  ? Procedure: POLYPECTOMY;  Surgeon:  Thornton Park, MD;  Location: [place];  Service: Gastroenterology;;  ? TONSILLECTOMY    ? TRANSURETHRAL RESECTION OF PROSTATE    ? ? ?Current Medications: ?Current Outpatient Medications on File Prior to Visit  ?Medication Sig  ? acetaminophen (TYLENOL) 325 MG tablet Take 650 mg by mouth every 6 (six) hours as needed for moderate pain.  ? amLODipine (NORVASC)  5 MG tablet Take 1 tablet (5 mg total) by mouth daily.  ? apixaban (ELIQUIS) 5 MG TABS tablet Take 1 tablet (5 mg total) by mouth 2 (two) times daily. Take Eliquis 5 mg twice a day one week after GI bleed.  Do not restart Xarelto. (Patient taking differently: Take 5 mg by mouth 2 (two) times daily.)  ? cetirizine (ZYRTEC) 10 MG tablet Take 10 mg by mouth daily as needed for allergies.   ? Colchicine 0.6 MG CAPS Take 0.6 mg by mouth 2 (two) times daily as needed. Gout flares  ? diclofenac (VOLTAREN) 75 MG EC tablet Take 75 mg by mouth daily.  ? EMGALITY 120 MG/ML SOAJ Inject 120 mg into the skin every 30 (thirty) days.  ? famotidine (PEPCID) 20 MG tablet Take 40 mg by mouth daily as needed for heartburn or indigestion.  ? ferrous sulfate 325 (65 FE) MG tablet Take 1 tablet (325 mg total) by mouth daily with breakfast.  ? fluticasone (FLONASE) 50 MCG/ACT nasal spray Place 1 spray into both nostrils daily as needed for allergies or rhinitis.  ? Fluticasone-Salmeterol (ADVAIR) 250-50 MCG/DOSE AEPB Inhale 1 puff into the lungs 2 (two) times daily as needed (wheezing).  ? gabapentin (NEURONTIN) 300 MG capsule Take 1 capsule (300 mg total) by mouth 4 (four) times daily. For nerve pain (Patient taking differently: Take 300 mg by mouth 2 (two) times daily as needed (For nerve pain).)  ? guaiFENesin (MUCINEX) 600 MG 12 hr tablet Take 600 mg by mouth daily as needed for to loosen phlegm.  ? isosorbide mononitrate (IMDUR) 30 MG 24 hr tablet Take 30 mg by mouth daily.  ? linaclotide (LINZESS) 145 MCG CAPS capsule Take 145 mcg by mouth daily as needed (Constipation).  ? losartan (COZAAR) 25 MG tablet Take 25 mg by mouth daily.  ? nitroGLYCERIN (NITROSTAT) 0.4 MG SL tablet Place 1 tablet (0.4 mg total) under the tongue every 5 (five) minutes as needed for chest pain.  ? ondansetron (ZOFRAN) 8 MG tablet Take 8 mg by mouth every 8 (eight) hours as needed for nausea or vomiting.   ? pantoprazole (PROTONIX) 40 MG tablet Take 40 mg by  mouth daily.  ? polyethylene glycol (MIRALAX / GLYCOLAX) 17 g packet Take 17 g by mouth daily. (Patient taking differently: Take 17 g by mouth daily as needed for mild constipation.)  ? sildenafil (REVATIO) 20 MG tablet Take 60-100 mg by mouth daily as needed (ED).  ? sotalol (BETAPACE) 80 MG tablet Take 1 tablet (80 mg total) by mouth every 12 (twelve) hours.  ? SUMAtriptan (IMITREX) 100 MG tablet Take 100 mg by mouth as directed. Take one tablet at onset, then an additional tablet every 12 hours as needed for migraine  ? traMADol (ULTRAM) 50 MG tablet Take 50 mg by mouth every 6 (six) hours as needed for moderate pain.  ? triamterene-hydrochlorothiazide (DYAZIDE) 37.5-25 MG capsule Take 1 capsule by mouth every morning.  ? cyclobenzaprine (FLEXERIL) 10 MG tablet Take 1 tablet (10 mg total) by mouth 3 (three) times daily as needed for muscle spasms.  ? cyclobenzaprine (FLEXERIL)  10 MG tablet Take 1 tablet (10 mg total) by mouth 3 (three) times daily as needed for muscle spasms.  ? HYDROcodone-acetaminophen (NORCO/VICODIN) 5-325 MG tablet Take 1-2 tablets by mouth every 4 (four) hours as needed for moderate pain.  ? ?No current facility-administered medications on file prior to visit.  ?  ? ?Allergies:   Patient has no known allergies.  ? ?Social History  ? ?Tobacco Use  ? Smoking status: Never  ? Smokeless tobacco: Never  ?Vaping Use  ? Vaping Use: Never used  ?Substance Use Topics  ? Alcohol use: No  ? Drug use: Never  ? ? ?Family History: ?family history includes CAD in his mother; Multiple myeloma in his brother; Sickle cell anemia in his father. There is no history of Gastric cancer. ? ?ROS:   ?Please see the history of present illness.  Additional pertinent ROS otherwise unremarkable.  ? ? ?EKGs/Labs/Other Studies Reviewed:   ? ?The following studies were reviewed today: ? ?TTE 09/05/2020 (Care Everywhere): ?SUMMARY  ?The left ventricular size is normal.  ?Mild left ventricular hypertrophy  ?Left ventricular  systolic function is normal.  ?The left atrial size is normal.  ?There is aortic valve sclerosis.  ?There is no pericardial effusion.  ?The right ventricle is normal in size and function.   ? ?Cath 08/23/2020

## 2021-08-10 ENCOUNTER — Inpatient Hospital Stay (HOSPITAL_COMMUNITY)
Admission: RE | Admit: 2021-08-10 | Discharge: 2021-08-11 | DRG: 455 | Disposition: A | Discharge status: Home / Self Care | Payer: Medicare Other | Attending: Neurosurgery | Admitting: Neurosurgery

## 2021-08-10 ENCOUNTER — Inpatient Hospital Stay (HOSPITAL_COMMUNITY)
Admission: RE | Disposition: A | Discharge status: Home / Self Care | Payer: Self-pay | Source: Home / Self Care | Attending: Neurosurgery

## 2021-08-10 ENCOUNTER — Other Ambulatory Visit: Payer: Self-pay

## 2021-08-10 ENCOUNTER — Inpatient Hospital Stay (HOSPITAL_COMMUNITY): Payer: Medicare Other

## 2021-08-10 ENCOUNTER — Inpatient Hospital Stay (HOSPITAL_COMMUNITY): Payer: Medicare Other | Admitting: Physician Assistant

## 2021-08-10 ENCOUNTER — Inpatient Hospital Stay (HOSPITAL_COMMUNITY): Payer: Medicare Other | Admitting: Anesthesiology

## 2021-08-10 DIAGNOSIS — Z79899 Other long term (current) drug therapy: Secondary | ICD-10-CM

## 2021-08-10 DIAGNOSIS — Z832 Family history of diseases of the blood and blood-forming organs and certain disorders involving the immune mechanism: Secondary | ICD-10-CM | POA: Diagnosis not present

## 2021-08-10 DIAGNOSIS — G4733 Obstructive sleep apnea (adult) (pediatric): Secondary | ICD-10-CM | POA: Diagnosis present

## 2021-08-10 DIAGNOSIS — Z807 Family history of other malignant neoplasms of lymphoid, hematopoietic and related tissues: Secondary | ICD-10-CM

## 2021-08-10 DIAGNOSIS — Z8249 Family history of ischemic heart disease and other diseases of the circulatory system: Secondary | ICD-10-CM | POA: Diagnosis not present

## 2021-08-10 DIAGNOSIS — J449 Chronic obstructive pulmonary disease, unspecified: Secondary | ICD-10-CM

## 2021-08-10 DIAGNOSIS — I4891 Unspecified atrial fibrillation: Secondary | ICD-10-CM | POA: Diagnosis present

## 2021-08-10 DIAGNOSIS — M4722 Other spondylosis with radiculopathy, cervical region: Secondary | ICD-10-CM | POA: Diagnosis present

## 2021-08-10 DIAGNOSIS — G959 Disease of spinal cord, unspecified: Secondary | ICD-10-CM | POA: Diagnosis not present

## 2021-08-10 DIAGNOSIS — Z7901 Long term (current) use of anticoagulants: Secondary | ICD-10-CM

## 2021-08-10 DIAGNOSIS — M4802 Spinal stenosis, cervical region: Secondary | ICD-10-CM | POA: Diagnosis present

## 2021-08-10 DIAGNOSIS — I10 Essential (primary) hypertension: Secondary | ICD-10-CM | POA: Diagnosis present

## 2021-08-10 DIAGNOSIS — K219 Gastro-esophageal reflux disease without esophagitis: Secondary | ICD-10-CM | POA: Diagnosis present

## 2021-08-10 DIAGNOSIS — M4712 Other spondylosis with myelopathy, cervical region: Secondary | ICD-10-CM | POA: Diagnosis present

## 2021-08-10 DIAGNOSIS — D573 Sickle-cell trait: Secondary | ICD-10-CM | POA: Diagnosis present

## 2021-08-10 HISTORY — PX: ANTERIOR CERVICAL DECOMP/DISCECTOMY FUSION: SHX1161

## 2021-08-10 SURGERY — ANTERIOR CERVICAL DECOMPRESSION/DISCECTOMY FUSION 3 LEVELS
Anesthesia: General | Site: Spine Cervical

## 2021-08-10 MED ORDER — CHLORHEXIDINE GLUCONATE CLOTH 2 % EX PADS: 6.0000 | MEDICATED_PAD | Freq: Once | CUTANEOUS | Status: DC

## 2021-08-10 MED ORDER — ORAL CARE MOUTH RINSE
15.0000 mL | Freq: Once | OROMUCOSAL | Status: AC
  Administered 2021-08-10: 15 mL via OROMUCOSAL

## 2021-08-10 MED ORDER — DEXAMETHASONE SODIUM PHOSPHATE 10 MG/ML IJ SOLN
INTRAMUSCULAR | Status: DC | PRN
  Administered 2021-08-10: 10 mg via INTRAVENOUS

## 2021-08-10 MED ORDER — ACETAMINOPHEN 325 MG PO TABS: 325.0000 mg | ORAL_TABLET | Freq: Once | ORAL | Status: DC | PRN

## 2021-08-10 MED ORDER — FAMOTIDINE 20 MG PO TABS: 40.0000 mg | ORAL_TABLET | Freq: Every day | ORAL | Status: DC | PRN

## 2021-08-10 MED ORDER — CEFAZOLIN SODIUM-DEXTROSE 1-4 GM/50ML-% IV SOLN
1.0000 g | Freq: Three times a day (TID) | INTRAVENOUS | Status: AC
  Administered 2021-08-10 – 2021-08-11 (×2): 1 g via INTRAVENOUS
  Filled 2021-08-10 (×2): qty 50

## 2021-08-10 MED ORDER — CEFAZOLIN SODIUM-DEXTROSE 2-4 GM/100ML-% IV SOLN
INTRAVENOUS | Status: AC
  Filled 2021-08-10: qty 100

## 2021-08-10 MED ORDER — ONDANSETRON HCL 4 MG PO TABS: 8.0000 mg | ORAL_TABLET | Freq: Three times a day (TID) | ORAL | Status: DC | PRN

## 2021-08-10 MED ORDER — CHLORHEXIDINE GLUCONATE 0.12 % MT SOLN: 15.0000 mL | Freq: Once | OROMUCOSAL | Status: AC

## 2021-08-10 MED ORDER — FLUTICASONE PROPIONATE 50 MCG/ACT NA SUSP: 1.0000 | Freq: Every day | NASAL | Status: DC | PRN

## 2021-08-10 MED ORDER — SOTALOL HCL 80 MG PO TABS
80.0000 mg | ORAL_TABLET | Freq: Two times a day (BID) | ORAL | Status: DC
  Administered 2021-08-10 – 2021-08-11 (×2): 80 mg via ORAL
  Filled 2021-08-10 (×4): qty 1

## 2021-08-10 MED ORDER — ISOSORBIDE MONONITRATE ER 30 MG PO TB24
30.0000 mg | ORAL_TABLET | Freq: Every day | ORAL | Status: DC
Start: 2021-08-11 — End: 2021-08-11
  Administered 2021-08-11: 30 mg via ORAL
  Filled 2021-08-10: qty 1

## 2021-08-10 MED ORDER — ACETAMINOPHEN 650 MG RE SUPP: 650.0000 mg | RECTAL | Status: DC | PRN

## 2021-08-10 MED ORDER — SUMATRIPTAN SUCCINATE 100 MG PO TABS
100.0000 mg | ORAL_TABLET | ORAL | Status: DC | PRN
  Filled 2021-08-10: qty 1

## 2021-08-10 MED ORDER — MIDAZOLAM HCL 2 MG/2ML IJ SOLN
INTRAMUSCULAR | Status: AC
  Filled 2021-08-10: qty 2

## 2021-08-10 MED ORDER — HYDROCODONE-ACETAMINOPHEN 5-325 MG PO TABS: 1.0000 | ORAL_TABLET | ORAL | Status: DC | PRN

## 2021-08-10 MED ORDER — FERROUS SULFATE 325 (65 FE) MG PO TABS
325.0000 mg | ORAL_TABLET | Freq: Every day | ORAL | Status: DC
  Administered 2021-08-11: 325 mg via ORAL
  Filled 2021-08-10: qty 1

## 2021-08-10 MED ORDER — ACETAMINOPHEN 10 MG/ML IV SOLN
1000.0000 mg | Freq: Once | INTRAVENOUS | Status: DC | PRN
  Administered 2021-08-10: 1000 mg via INTRAVENOUS

## 2021-08-10 MED ORDER — MIDAZOLAM HCL 2 MG/2ML IJ SOLN
INTRAMUSCULAR | Status: DC | PRN
  Administered 2021-08-10: 2 mg via INTRAVENOUS

## 2021-08-10 MED ORDER — ROCURONIUM BROMIDE 10 MG/ML (PF) SYRINGE
PREFILLED_SYRINGE | INTRAVENOUS | Status: AC
  Filled 2021-08-10: qty 10

## 2021-08-10 MED ORDER — DEXAMETHASONE SODIUM PHOSPHATE 10 MG/ML IJ SOLN
INTRAMUSCULAR | Status: AC
  Filled 2021-08-10: qty 1

## 2021-08-10 MED ORDER — SODIUM CHLORIDE 0.9 % IV SOLN
250.0000 mL | INTRAVENOUS | Status: DC
  Administered 2021-08-10: 250 mL via INTRAVENOUS

## 2021-08-10 MED ORDER — LORATADINE 10 MG PO TABS
10.0000 mg | ORAL_TABLET | Freq: Every day | ORAL | Status: DC
Start: 2021-08-10 — End: 2021-08-11
  Administered 2021-08-10 – 2021-08-11 (×2): 10 mg via ORAL
  Filled 2021-08-10 (×2): qty 1

## 2021-08-10 MED ORDER — LACTATED RINGERS IV SOLN: INTRAVENOUS | Status: DC

## 2021-08-10 MED ORDER — SILDENAFIL CITRATE 20 MG PO TABS: 60.0000 mg | ORAL_TABLET | Freq: Every day | ORAL | Status: DC | PRN

## 2021-08-10 MED ORDER — PROPOFOL 10 MG/ML IV BOLUS
INTRAVENOUS | Status: AC
  Filled 2021-08-10: qty 20

## 2021-08-10 MED ORDER — THROMBIN 5000 UNITS EX SOLR
CUTANEOUS | Status: AC
  Filled 2021-08-10: qty 5000

## 2021-08-10 MED ORDER — HYDROCODONE-ACETAMINOPHEN 10-325 MG PO TABS
1.0000 | ORAL_TABLET | ORAL | Status: DC | PRN
  Administered 2021-08-10 (×3): 2 via ORAL
  Administered 2021-08-11 (×2): 1 via ORAL
  Filled 2021-08-10 (×2): qty 2
  Filled 2021-08-10 (×2): qty 1
  Filled 2021-08-10: qty 2

## 2021-08-10 MED ORDER — DICLOFENAC SODIUM 75 MG PO TBEC
75.0000 mg | DELAYED_RELEASE_TABLET | Freq: Every day | ORAL | Status: DC
  Administered 2021-08-10 – 2021-08-11 (×2): 75 mg via ORAL
  Filled 2021-08-10 (×2): qty 1

## 2021-08-10 MED ORDER — SODIUM CHLORIDE 0.9% FLUSH: 3.0000 mL | INTRAVENOUS | Status: DC | PRN

## 2021-08-10 MED ORDER — PHENYLEPHRINE 80 MCG/ML (10ML) SYRINGE FOR IV PUSH (FOR BLOOD PRESSURE SUPPORT)
PREFILLED_SYRINGE | INTRAVENOUS | Status: AC
  Filled 2021-08-10: qty 10

## 2021-08-10 MED ORDER — FENTANYL CITRATE (PF) 250 MCG/5ML IJ SOLN
INTRAMUSCULAR | Status: DC | PRN
  Administered 2021-08-10: 50 ug via INTRAVENOUS
  Administered 2021-08-10: 75 ug via INTRAVENOUS

## 2021-08-10 MED ORDER — ALBUMIN HUMAN 5 % IV SOLN: INTRAVENOUS | Status: DC | PRN

## 2021-08-10 MED ORDER — HYDROMORPHONE HCL 1 MG/ML IJ SOLN
0.2500 mg | INTRAMUSCULAR | Status: DC | PRN
  Administered 2021-08-10 (×2): 0.5 mg via INTRAVENOUS

## 2021-08-10 MED ORDER — ROCURONIUM BROMIDE 10 MG/ML (PF) SYRINGE
PREFILLED_SYRINGE | INTRAVENOUS | Status: DC | PRN
  Administered 2021-08-10: 20 mg via INTRAVENOUS
  Administered 2021-08-10: 30 mg via INTRAVENOUS
  Administered 2021-08-10: 20 mg via INTRAVENOUS
  Administered 2021-08-10: 60 mg via INTRAVENOUS

## 2021-08-10 MED ORDER — ACETAMINOPHEN 10 MG/ML IV SOLN
INTRAVENOUS | Status: AC
  Filled 2021-08-10: qty 100

## 2021-08-10 MED ORDER — THROMBIN 5000 UNITS EX SOLR: CUTANEOUS | Status: DC | PRN

## 2021-08-10 MED ORDER — LIDOCAINE 2% (20 MG/ML) 5 ML SYRINGE
INTRAMUSCULAR | Status: DC | PRN
  Administered 2021-08-10: 50 mg via INTRAVENOUS

## 2021-08-10 MED ORDER — PHENYLEPHRINE 80 MCG/ML (10ML) SYRINGE FOR IV PUSH (FOR BLOOD PRESSURE SUPPORT)
PREFILLED_SYRINGE | INTRAVENOUS | Status: DC | PRN
  Administered 2021-08-10: 80 ug via INTRAVENOUS
  Administered 2021-08-10: 160 ug via INTRAVENOUS
  Administered 2021-08-10: 80 ug via INTRAVENOUS

## 2021-08-10 MED ORDER — MENTHOL 3 MG MT LOZG
1.0000 | LOZENGE | OROMUCOSAL | Status: DC | PRN
  Administered 2021-08-11: 3 mg via ORAL
  Filled 2021-08-10: qty 9

## 2021-08-10 MED ORDER — EPHEDRINE SULFATE-NACL 50-0.9 MG/10ML-% IV SOSY
PREFILLED_SYRINGE | INTRAVENOUS | Status: DC | PRN
  Administered 2021-08-10: 10 mg via INTRAVENOUS

## 2021-08-10 MED ORDER — TRAMADOL HCL 50 MG PO TABS: 50.0000 mg | ORAL_TABLET | Freq: Four times a day (QID) | ORAL | Status: DC | PRN

## 2021-08-10 MED ORDER — SUGAMMADEX SODIUM 200 MG/2ML IV SOLN
INTRAVENOUS | Status: DC | PRN
  Administered 2021-08-10: 400 mg via INTRAVENOUS

## 2021-08-10 MED ORDER — ACETAMINOPHEN 325 MG PO TABS: 650.0000 mg | ORAL_TABLET | ORAL | Status: DC | PRN

## 2021-08-10 MED ORDER — AMLODIPINE BESYLATE 5 MG PO TABS
5.0000 mg | ORAL_TABLET | Freq: Every day | ORAL | Status: DC
  Administered 2021-08-10 – 2021-08-11 (×2): 5 mg via ORAL
  Filled 2021-08-10 (×2): qty 1

## 2021-08-10 MED ORDER — NITROGLYCERIN 0.4 MG SL SUBL: 0.4000 mg | SUBLINGUAL_TABLET | SUBLINGUAL | Status: DC | PRN

## 2021-08-10 MED ORDER — HYDROMORPHONE HCL 1 MG/ML IJ SOLN
INTRAMUSCULAR | Status: AC
  Filled 2021-08-10: qty 1

## 2021-08-10 MED ORDER — LOSARTAN POTASSIUM 50 MG PO TABS
25.0000 mg | ORAL_TABLET | Freq: Every day | ORAL | Status: DC
  Administered 2021-08-10 – 2021-08-11 (×2): 25 mg via ORAL
  Filled 2021-08-10 (×2): qty 1

## 2021-08-10 MED ORDER — SODIUM CHLORIDE 0.9% FLUSH
3.0000 mL | Freq: Two times a day (BID) | INTRAVENOUS | Status: DC
  Administered 2021-08-10 (×2): 3 mL via INTRAVENOUS

## 2021-08-10 MED ORDER — ONDANSETRON HCL 4 MG PO TABS: 4.0000 mg | ORAL_TABLET | Freq: Four times a day (QID) | ORAL | Status: DC | PRN

## 2021-08-10 MED ORDER — LINACLOTIDE 145 MCG PO CAPS
145.0000 ug | ORAL_CAPSULE | Freq: Every day | ORAL | Status: DC | PRN
  Filled 2021-08-10: qty 1

## 2021-08-10 MED ORDER — GALCANEZUMAB-GNLM 120 MG/ML ~~LOC~~ SOAJ: 120.0000 mg | SUBCUTANEOUS | Status: DC

## 2021-08-10 MED ORDER — 0.9 % SODIUM CHLORIDE (POUR BTL) OPTIME
TOPICAL | Status: DC | PRN
  Administered 2021-08-10: 1000 mL

## 2021-08-10 MED ORDER — HYDROMORPHONE HCL 1 MG/ML IJ SOLN: 1.0000 mg | INTRAMUSCULAR | Status: DC | PRN

## 2021-08-10 MED ORDER — FENTANYL CITRATE (PF) 250 MCG/5ML IJ SOLN
INTRAMUSCULAR | Status: AC
  Filled 2021-08-10: qty 5

## 2021-08-10 MED ORDER — GABAPENTIN 300 MG PO CAPS
300.0000 mg | ORAL_CAPSULE | Freq: Two times a day (BID) | ORAL | Status: DC | PRN
  Filled 2021-08-10: qty 1

## 2021-08-10 MED ORDER — AMISULPRIDE (ANTIEMETIC) 5 MG/2ML IV SOLN: 10.0000 mg | Freq: Once | INTRAVENOUS | Status: DC | PRN

## 2021-08-10 MED ORDER — EPHEDRINE 5 MG/ML INJ
INTRAVENOUS | Status: AC
  Filled 2021-08-10: qty 5

## 2021-08-10 MED ORDER — LIDOCAINE 2% (20 MG/ML) 5 ML SYRINGE
INTRAMUSCULAR | Status: AC
  Filled 2021-08-10: qty 5

## 2021-08-10 MED ORDER — TRIAMTERENE-HCTZ 37.5-25 MG PO CAPS
1.0000 | ORAL_CAPSULE | Freq: Every morning | ORAL | Status: DC
  Filled 2021-08-10: qty 1

## 2021-08-10 MED ORDER — CHLORHEXIDINE GLUCONATE 0.12 % MT SOLN
OROMUCOSAL | Status: AC
  Administered 2021-08-10: 15 mL
  Filled 2021-08-10: qty 15

## 2021-08-10 MED ORDER — ACETAMINOPHEN 160 MG/5ML PO SOLN: 325.0000 mg | Freq: Once | ORAL | Status: DC | PRN

## 2021-08-10 MED ORDER — THROMBIN 20000 UNITS EX SOLR
CUTANEOUS | Status: AC
  Filled 2021-08-10: qty 20000

## 2021-08-10 MED ORDER — ONDANSETRON HCL 4 MG/2ML IJ SOLN
INTRAMUSCULAR | Status: DC | PRN
  Administered 2021-08-10: 4 mg via INTRAVENOUS

## 2021-08-10 MED ORDER — CYCLOBENZAPRINE HCL 10 MG PO TABS
10.0000 mg | ORAL_TABLET | Freq: Three times a day (TID) | ORAL | Status: DC | PRN
  Administered 2021-08-10 – 2021-08-11 (×3): 10 mg via ORAL
  Filled 2021-08-10 (×3): qty 1

## 2021-08-10 MED ORDER — PANTOPRAZOLE SODIUM 40 MG PO TBEC
40.0000 mg | DELAYED_RELEASE_TABLET | Freq: Every day | ORAL | Status: DC
  Administered 2021-08-11: 40 mg via ORAL
  Filled 2021-08-10: qty 1

## 2021-08-10 MED ORDER — PROPOFOL 10 MG/ML IV BOLUS
INTRAVENOUS | Status: DC | PRN
  Administered 2021-08-10: 160 mg via INTRAVENOUS

## 2021-08-10 MED ORDER — POLYETHYLENE GLYCOL 3350 17 G PO PACK: 17.0000 g | PACK | Freq: Every day | ORAL | Status: DC | PRN

## 2021-08-10 MED ORDER — THROMBIN 20000 UNITS EX SOLR: CUTANEOUS | Status: DC | PRN

## 2021-08-10 MED ORDER — PHENOL 1.4 % MT LIQD: 1.0000 | OROMUCOSAL | Status: DC | PRN

## 2021-08-10 MED ORDER — MOMETASONE FURO-FORMOTEROL FUM 200-5 MCG/ACT IN AERO
2.0000 | INHALATION_SPRAY | Freq: Two times a day (BID) | RESPIRATORY_TRACT | Status: DC
Start: 2021-08-10 — End: 2021-08-11
  Filled 2021-08-10: qty 8.8

## 2021-08-10 MED ORDER — GUAIFENESIN ER 600 MG PO TB12
600.0000 mg | ORAL_TABLET | Freq: Every day | ORAL | Status: DC | PRN
  Filled 2021-08-10: qty 1

## 2021-08-10 MED ORDER — ONDANSETRON HCL 4 MG/2ML IJ SOLN
INTRAMUSCULAR | Status: AC
  Filled 2021-08-10: qty 2

## 2021-08-10 MED ORDER — CEFAZOLIN SODIUM-DEXTROSE 2-4 GM/100ML-% IV SOLN
2.0000 g | INTRAVENOUS | Status: AC
  Administered 2021-08-10: 2 g via INTRAVENOUS

## 2021-08-10 MED ORDER — TRIAMTERENE-HCTZ 37.5-25 MG PO TABS
1.0000 | ORAL_TABLET | Freq: Every day | ORAL | Status: DC
  Administered 2021-08-10 – 2021-08-11 (×2): 1 via ORAL
  Filled 2021-08-10 (×2): qty 1

## 2021-08-10 MED ORDER — PHENYLEPHRINE HCL-NACL 20-0.9 MG/250ML-% IV SOLN
INTRAVENOUS | Status: DC | PRN
  Administered 2021-08-10: 20 ug/min via INTRAVENOUS

## 2021-08-10 MED ORDER — MEPERIDINE HCL 25 MG/ML IJ SOLN: 6.2500 mg | INTRAMUSCULAR | Status: DC | PRN

## 2021-08-10 MED ORDER — ONDANSETRON HCL 4 MG/2ML IJ SOLN: 4.0000 mg | Freq: Four times a day (QID) | INTRAMUSCULAR | Status: DC | PRN

## 2021-08-10 SURGICAL SUPPLY — 54 items
BAG COUNTER SPONGE SURGICOUNT (BAG) ×2 IMPLANT
BAG DECANTER FOR FLEXI CONT (MISCELLANEOUS) ×2 IMPLANT
BAND RUBBER #18 3X1/16 STRL (MISCELLANEOUS) ×4 IMPLANT
BENZOIN TINCTURE PRP APPL 2/3 (GAUZE/BANDAGES/DRESSINGS) ×2 IMPLANT
BUR MATCHSTICK NEURO 3.0 LAGG (BURR) ×2 IMPLANT
CAGE PEEK 7X14X11 (Cage) ×6 IMPLANT
CANISTER SUCT 3000ML PPV (MISCELLANEOUS) ×2 IMPLANT
CARTRIDGE OIL MAESTRO DRILL (MISCELLANEOUS) ×1 IMPLANT
DIFFUSER DRILL AIR PNEUMATIC (MISCELLANEOUS) ×2 IMPLANT
DRAPE C-ARM 42X72 X-RAY (DRAPES) ×4 IMPLANT
DRAPE LAPAROTOMY 100X72 PEDS (DRAPES) ×2 IMPLANT
DRAPE MICROSCOPE LEICA (MISCELLANEOUS) ×2 IMPLANT
DURAPREP 6ML APPLICATOR 50/CS (WOUND CARE) ×2 IMPLANT
ELECT COATED BLADE 2.86 ST (ELECTRODE) ×2 IMPLANT
ELECT REM PT RETURN 9FT ADLT (ELECTROSURGICAL) ×2
ELECTRODE REM PT RTRN 9FT ADLT (ELECTROSURGICAL) ×1 IMPLANT
GAUZE 4X4 16PLY ~~LOC~~+RFID DBL (SPONGE) IMPLANT
GAUZE SPONGE 4X4 12PLY STRL (GAUZE/BANDAGES/DRESSINGS) ×2 IMPLANT
GLOVE ECLIPSE 9.0 STRL (GLOVE) ×2 IMPLANT
GLOVE EXAM NITRILE XL STR (GLOVE) IMPLANT
GOWN STRL REUS W/ TWL LRG LVL3 (GOWN DISPOSABLE) IMPLANT
GOWN STRL REUS W/ TWL XL LVL3 (GOWN DISPOSABLE) IMPLANT
GOWN STRL REUS W/TWL 2XL LVL3 (GOWN DISPOSABLE) IMPLANT
GOWN STRL REUS W/TWL LRG LVL3 (GOWN DISPOSABLE)
GOWN STRL REUS W/TWL XL LVL3 (GOWN DISPOSABLE)
HALTER HD/CHIN CERV TRACTION D (MISCELLANEOUS) ×2 IMPLANT
HEMOSTAT POWDER KIT SURGIFOAM (HEMOSTASIS) ×3 IMPLANT
KIT BASIN OR (CUSTOM PROCEDURE TRAY) ×2 IMPLANT
KIT TURNOVER KIT B (KITS) ×2 IMPLANT
NDL SPNL 20GX3.5 QUINCKE YW (NEEDLE) ×1 IMPLANT
NEEDLE SPNL 20GX3.5 QUINCKE YW (NEEDLE) ×2 IMPLANT
NS IRRIG 1000ML POUR BTL (IV SOLUTION) ×2 IMPLANT
OIL CARTRIDGE MAESTRO DRILL (MISCELLANEOUS) ×2
PACK LAMINECTOMY NEURO (CUSTOM PROCEDURE TRAY) ×2 IMPLANT
PAD ARMBOARD 7.5X6 YLW CONV (MISCELLANEOUS) ×6 IMPLANT
PLATE 3 60XNS SPNE CVD ANT T (Plate) IMPLANT
PLATE 3 ATLANTIS TRANS (Plate) ×2 IMPLANT
SCREW ST 15X4XST FXANG NS (Screw) IMPLANT
SCREW ST FIX 4 ATL (Screw) ×2 IMPLANT
SCREW ST FIX 4 ATL 3120213 (Screw) ×7 IMPLANT
SPACER SPNL 11X14X7XPEEK CVD (Cage) IMPLANT
SPCR SPNL 11X14X7XPEEK CVD (Cage) ×3 IMPLANT
SPONGE INTESTINAL PEANUT (DISPOSABLE) ×2 IMPLANT
SPONGE SURGIFOAM ABS GEL 100 (HEMOSTASIS) ×2 IMPLANT
STRIP CLOSURE SKIN 1/2X4 (GAUZE/BANDAGES/DRESSINGS) ×2 IMPLANT
STRIP CLOSURE SKIN 1/4X4 (GAUZE/BANDAGES/DRESSINGS) ×1 IMPLANT
SUT VIC AB 3-0 SH 8-18 (SUTURE) ×2 IMPLANT
SUT VIC AB 4-0 RB1 18 (SUTURE) ×2 IMPLANT
TAPE CLOTH 4X10 WHT NS (GAUZE/BANDAGES/DRESSINGS) ×2 IMPLANT
TAPE CLOTH SURG 4X10 WHT LF (GAUZE/BANDAGES/DRESSINGS) ×1 IMPLANT
TOWEL GREEN STERILE (TOWEL DISPOSABLE) ×2 IMPLANT
TOWEL GREEN STERILE FF (TOWEL DISPOSABLE) ×2 IMPLANT
TRAP SPECIMEN MUCUS 40CC (MISCELLANEOUS) ×2 IMPLANT
WATER STERILE IRR 1000ML POUR (IV SOLUTION) ×2 IMPLANT

## 2021-08-10 NOTE — Anesthesia Procedure Notes (Signed)
Procedure Name: Intubation ?Date/Time: 08/10/2021 8:11 AM ?Performed by: Vonna Drafts, CRNA ?Pre-anesthesia Checklist: Patient identified, Emergency Drugs available, Suction available and Patient being monitored ?Patient Re-evaluated:Patient Re-evaluated prior to induction ?Oxygen Delivery Method: Circle system utilized ?Preoxygenation: Pre-oxygenation with 100% oxygen ?Induction Type: IV induction ?Ventilation: Mask ventilation without difficulty ?Laryngoscope Size: Glidescope and 4 ?Grade View: Grade I ?Tube type: Oral ?Tube size: 7.5 mm ?Number of attempts: 1 ?Airway Equipment and Method: Stylet ?Placement Confirmation: ETT inserted through vocal cords under direct vision, positive ETCO2 and breath sounds checked- equal and bilateral ?Secured at: 22 cm ?Tube secured with: Tape ?Dental Injury: Teeth and Oropharynx as per pre-operative assessment  ? ? ? ? ?

## 2021-08-10 NOTE — Transfer of Care (Signed)
Immediate Anesthesia Transfer of Care Note ? ?Patient: [name] ? ?Procedure(s) Performed: CERVICAL THREE-FOUR, CERVICAL FOUR-FIVE, CERVICAL FIVE-SIX ANTERIOR CERVICAL DECOMPRESSION/DISCECTOMY FUSION (Spine Cervical) ? ?Patient Location: PACU ? ?Anesthesia Type:General ? ?Level of Consciousness: drowsy ? ?Airway & Oxygen Therapy: Patient Spontanous Breathing and Patient connected to face mask oxygen ? ?Post-op Assessment: Report given to RN and Post -op Vital signs reviewed and stable ? ?Post vital signs: Reviewed and stable ? ?Last Vitals:  ?Vitals Value Taken Time  ?BP 146/96 08/10/21 1107  ?Temp 36.3 ?C 08/10/21 1105  ?Pulse 59 08/10/21 1108  ?Resp 14 08/10/21 1108  ?SpO2 100 % 08/10/21 1108  ?Vitals shown include unvalidated device data. ? ?Last Pain:  ?Vitals:  ? 08/10/21 0715  ?TempSrc:   ?PainSc: 0-No pain  ?   ? ?  ? ?Complications: No notable events documented. ?

## 2021-08-10 NOTE — Anesthesia Postprocedure Evaluation (Signed)
Anesthesia Post Note ? ?Patient: [name] ? ?Procedure(s) Performed: CERVICAL THREE-FOUR, CERVICAL FOUR-FIVE, CERVICAL FIVE-SIX ANTERIOR CERVICAL DECOMPRESSION/DISCECTOMY FUSION (Spine Cervical) ? ?  ? ?Patient location during evaluation: PACU ?Anesthesia Type: General ?Level of consciousness: awake and alert ?Pain management: pain level controlled ?Vital Signs Assessment: post-procedure vital signs reviewed and stable ?Respiratory status: spontaneous breathing, nonlabored ventilation, respiratory function stable and patient connected to nasal cannula oxygen ?Cardiovascular status: blood pressure returned to baseline and stable ?Postop Assessment: no apparent nausea or vomiting ?Anesthetic complications: no ? ? ?No notable events documented. ? ?Last Vitals:  ?Vitals:  ? 08/10/21 1205 08/10/21 1234  ?BP: (!) 131/91 (!) 151/95  ?Pulse: 61 (!) 58  ?Resp: 15 17  ?Temp: (!) 36.3 ?C   ?SpO2: 95% 98%  ?  ?Last Pain:  ?Vitals:  ? 08/10/21 1205  ?TempSrc:   ?PainSc: Asleep  ? ? ?  ?  ?  ?  ?  ?  ? ?Effie Berkshire ? ? ? ? ?

## 2021-08-10 NOTE — Op Note (Signed)
Date of procedure: 08/10/2021 ? ?Date of dictation: Same ? ?Service: Neurosurgery ? ?Preoperative diagnosis: Cervical stenosis with myelopathy ? ?Postoperative diagnosis: Same ? ?Procedure Name: C3-4, C4-5, C5-6 anterior cervical discectomy with interbody fusion utilizing interbody cages, local harvested autograft, and anterior plate instrumentation ? ?Surgeon:Dawne Casali A.Kejuan Bekker, M.D. ? ?Asst. Surgeon: Reinaldo Meeker, NP ? ?Anesthesia: General ? ?Indication: 61 year old male with neck and bilateral upper extremity numbness pain and weakness which is progressively worsening and consistent with cervical myelopathy.  Work-up demonstrates evidence of marked cervical spondylosis with stenosis and ongoing severe cord compression at C3-4, C4-5 and C5-6.  Patient presents now for 3 level anterior cervical decompression and fusion in hopes of improving his symptoms. ? ?Operative note: After induction of anesthesia, patient position prone onto Wilson frame and properly padded.  Lumbar region prepped and draped sterilely.  Incision made overlying C4-5.  Dissection performed on the right.  Retractor placed.  Fluoroscopy used.  Levels confirmed.  Anterior osteophytes overlying C3-4, C4-5 and C5-6 were removed.  The spaces were identified.  Discectomy was then performed using various instruments down to level the posterior annulus.  Microscope was then brought to the field used throughout the remainder of the discectomy.  Remaining aspects of annulus and osteophytes removed using high-speed drill down to the level of the posterior longitudinal ligament.  Posterior logical is not elevated and resected in a piecemeal fashion.  Underlying thecal sac was then identified.  Wide central decompression then performed undercutting the bodies of C3 and C4.  Decompression then proceeded bilaterally and the C4 nerve roots were decompressed bilaterally.  At this point a very thorough decompression been achieved.  There was no evidence of injury to the  thecal sac or nerve roots.  Procedure was then repeated at C4-5 and C5-6 again without complications.  [employer] anatomic peek cages were then packed with locally harvested autograft in each cage was impacted in to the appropriate level.  All cages were sized slightly from the anterior cortical margin.  Anterior cervical plate was then placed over the C3, C4, C5 and C6 levels.  This then attached under fluoroscopic guidance using fixed angled screws.  Final images reveal good position of cages and hardware with proper upper level with normal alignment of spine.  Locking screws were engaged at all levels.  Retractor was removed.  Hemostasis was ensured.  Wounds then closed in layers with Vicryl sutures.  Steri-Strips and sterile dressing were applied.  No apparent complications.  Patient tolerated the procedure well and he returned to the recovery room postop. ? ?

## 2021-08-10 NOTE — Progress Notes (Signed)
Orthopedic Tech Progress Note ?Patient Details:  ?[name] ?May 16, 1960 ?[id number] ? ?Ortho Devices ?Type of Ortho Device: Soft collar ?Ortho Device/Splint Interventions: Ordered ?  ?  ? ?[name] ?08/10/2021, 11:20 AM ? ?

## 2021-08-10 NOTE — Brief Op Note (Signed)
08/10/2021 ? ?10:51 AM ? ?PATIENT:  NTHONY LEFFERTS  61 y.o. male ? ?PRE-OPERATIVE DIAGNOSIS:  Stenosis of cervical spine with myelopathy ? ?POST-OPERATIVE DIAGNOSIS:  Stenosis of cervical spine with myelopathy ? ?PROCEDURE:  Procedure(s) with comments: ?CERVICAL THREE-FOUR, CERVICAL FOUR-FIVE, CERVICAL FIVE-SIX ANTERIOR CERVICAL DECOMPRESSION/DISCECTOMY FUSION (N/A) - 3C/RM 20 ? ?SURGEON:  Surgeon(s) and Role: ?   Earnie Larsson, MD - Primary ? ?PHYSICIAN ASSISTANT:  ? ?ASSISTANTS: Bergman,NP  ? ?ANESTHESIA:   general ? ?EBL:  700 mL  ? ?BLOOD ADMINISTERED:none ? ?DRAINS: none  ? ?LOCAL MEDICATIONS USED:  NONE ? ?SPECIMEN:  No Specimen ? ?DISPOSITION OF SPECIMEN:  N/A ? ?COUNTS:  YES ? ?TOURNIQUET:  * No tourniquets in log * ? ?DICTATION: .Dragon Dictation ? ?PLAN OF CARE: Admit to inpatient  ? ?PATIENT DISPOSITION:  PACU - hemodynamically stable. ?  ?Delay start of Pharmacological VTE agent (>24hrs) due to surgical blood loss or risk of bleeding: yes ? ?

## 2021-08-10 NOTE — H&P (Signed)
?[name] is an 61 y.o. male.   ?Chief Complaint: Neck pain ?HPI: 61 year old male with progressive neck pain with bilateral upper extremity sensory loss, loss of coordination and fine motor control, and some weakness.  Work-up demonstrates evidence of severe cervical stenosis with ongoing cord compression and some early cord signal abnormality.  Patient presents now for C3-4, C4-5, C5-6 anterior cervical discectomy with interbody fusion in hopes of improving his symptoms. ? ?Past Medical History:  ?Diagnosis Date  ? A-fib ([place])   ? Chest pain   ? a. 11/2002 Cath: EF 68%, LM nl, LAD small, nl, LCX nl, RCA tortuous/nl;  b. 08/2011 St Echo: EF 60%, normal contractility, no scar/ischemia.  ? COPD (chronic obstructive pulmonary disease) ([place])   ? Dysrhythmia   ? Enlarged prostate   ? GERD (gastroesophageal reflux disease)   ? GI bleeding 08/2018  ? Hypertension   ? Sickle cell trait ([place])   ? Sleep apnea   ? SVT (supraventricular tachycardia) ([place])   ? ? ?Past Surgical History:  ?Procedure Laterality Date  ? ATRIAL FIBRILLATION ABLATION    ? BIOPSY  08/27/2018  ? Procedure: BIOPSY;  Surgeon: Thornton Park, MD;  Location: [place];  Service: Gastroenterology;;  ? BIOPSY  08/28/2018  ? Procedure: BIOPSY;  Surgeon: Thornton Park, MD;  Location: [place];  Service: Gastroenterology;;  ? COLONOSCOPY WITH PROPOFOL N/A 08/28/2018  ? Procedure: COLONOSCOPY WITH PROPOFOL;  Surgeon: Thornton Park, MD;  Location: [place];  Service: Gastroenterology;  Laterality: N/A;  ? ENTEROSCOPY N/A 08/27/2018  ? Procedure: ENTEROSCOPY;  Surgeon: Thornton Park, MD;  Location: [place];  Service: Gastroenterology;  Laterality: N/A;  ? GIVENS CAPSULE STUDY N/A 08/28/2018  ? Procedure: GIVENS CAPSULE STUDY;  Surgeon: Thornton Park, MD;  Location: [place];  Service: Gastroenterology;  Laterality: N/A;  ? LEFT HEART CATHETERIZATION WITH CORONARY ANGIOGRAM N/A 03/11/2013  ? Procedure: LEFT HEART  CATHETERIZATION WITH CORONARY ANGIOGRAM;  Surgeon: Jolaine Artist, MD;  Location: [facility] CATH LAB;  Service: Cardiovascular;  Laterality: N/A;  ? POLYPECTOMY  08/28/2018  ? Procedure: POLYPECTOMY;  Surgeon: Thornton Park, MD;  Location: [place];  Service: Gastroenterology;;  ? TONSILLECTOMY    ? TRANSURETHRAL RESECTION OF PROSTATE    ? ? ?Family History  ?Problem Relation Age of Onset  ? CAD Mother   ?     died in her 27's - MI  ? Multiple myeloma Brother   ? Sickle cell anemia Father   ?     died @ 40  ? Gastric cancer Neg Hx   ? ?Social History:  reports that he has never smoked. He has never used smokeless tobacco. He reports that he does not drink alcohol and does not use drugs. ? ?Allergies: No Known Allergies ? ?Medications Prior to Admission  ?Medication Sig Dispense Refill  ? acetaminophen (TYLENOL) 325 MG tablet Take 650 mg by mouth every 6 (six) hours as needed for moderate pain.    ? amLODipine (NORVASC) 5 MG tablet Take 1 tablet (5 mg total) by mouth daily. 30 tablet 0  ? apixaban (ELIQUIS) 5 MG TABS tablet Take 1 tablet (5 mg total) by mouth 2 (two) times daily. Take Eliquis 5 mg twice a day one week after GI bleed.  Do not restart Xarelto. (Patient taking differently: Take 5 mg by mouth 2 (two) times daily.) 60 tablet 0  ? cetirizine (ZYRTEC) 10 MG tablet Take 10 mg by mouth daily as needed for allergies.     ?  cyclobenzaprine (FLEXERIL) 10 MG tablet Take 10 tablets by mouth 3 (three) times daily as needed for muscle spasms.    ? diclofenac (VOLTAREN) 75 MG EC tablet Take 75 mg by mouth daily.    ? EMGALITY 120 MG/ML SOAJ Inject 120 mg into the skin every 30 (thirty) days.    ? famotidine (PEPCID) 20 MG tablet Take 40 mg by mouth daily as needed for heartburn or indigestion.    ? ferrous sulfate 325 (65 FE) MG tablet Take 1 tablet (325 mg total) by mouth daily with breakfast. 60 tablet 0  ? gabapentin (NEURONTIN) 300 MG capsule Take 1 capsule (300 mg total) by mouth 4 (four) times daily. For  nerve pain (Patient taking differently: Take 300 mg by mouth 2 (two) times daily as needed (For nerve pain).) 120 capsule 5  ? isosorbide mononitrate (IMDUR) 30 MG 24 hr tablet Take 30 mg by mouth daily.    ? linaclotide (LINZESS) 145 MCG CAPS capsule Take 145 mcg by mouth daily as needed (Constipation).    ? losartan (COZAAR) 25 MG tablet Take 25 mg by mouth daily.    ? nitroGLYCERIN (NITROSTAT) 0.4 MG SL tablet Place 1 tablet (0.4 mg total) under the tongue every 5 (five) minutes as needed for chest pain. 20 tablet 1  ? pantoprazole (PROTONIX) 40 MG tablet Take 40 mg by mouth daily.    ? sildenafil (REVATIO) 20 MG tablet Take 60-100 mg by mouth daily as needed (ED).    ? sotalol (BETAPACE) 80 MG tablet Take 1 tablet (80 mg total) by mouth every 12 (twelve) hours. 60 tablet 0  ? SUMAtriptan (IMITREX) 100 MG tablet Take 100 mg by mouth as directed. Take one tablet at onset, then an additional tablet every 12 hours as needed for migraine    ? traMADol (ULTRAM) 50 MG tablet Take 50 mg by mouth every 6 (six) hours as needed for moderate pain.    ? triamterene-hydrochlorothiazide (DYAZIDE) 37.5-25 MG capsule Take 1 capsule by mouth every morning.    ? Colchicine 0.6 MG CAPS Take 0.6 mg by mouth 2 (two) times daily as needed. Gout flares    ? fluticasone (FLONASE) 50 MCG/ACT nasal spray Place 1 spray into both nostrils daily as needed for allergies or rhinitis.    ? Fluticasone-Salmeterol (ADVAIR) 250-50 MCG/DOSE AEPB Inhale 1 puff into the lungs 2 (two) times daily as needed (wheezing).    ? guaiFENesin (MUCINEX) 600 MG 12 hr tablet Take 600 mg by mouth daily as needed for to loosen phlegm.    ? ondansetron (ZOFRAN) 8 MG tablet Take 8 mg by mouth every 8 (eight) hours as needed for nausea or vomiting.     ? polyethylene glycol (MIRALAX / GLYCOLAX) 17 g packet Take 17 g by mouth daily. (Patient taking differently: Take 17 g by mouth daily as needed for mild constipation.) 14 each 0  ? ? ?No results found for this or any  previous visit (from the past 48 hour(s)). ?No results found. ? ?Pertinent items noted in HPI and remainder of comprehensive ROS otherwise negative. ? ?Blood pressure (!) 137/97, pulse 62, temperature 97.6 ?F (36.4 ?C), temperature source Oral, resp. rate 18, height $RemoveBe'5\' 11"'nvIPPZnQi$  (1.803 m), weight 97.5 kg, SpO2 100 %. ? ?Patient is awake and alert.  He is oriented and appropriate.  Speech is fluent.  Judgment insight are intact.  Cranial nerve function normal moderate motor examination reveals weakness of his grips and intrinsics bilaterally grading out of 4/5.  He has some moderate spasticity and mild weakness of both lower extremities.  Sensory examination with patchy distal sensory loss in both upper and lower extremities.  Reflexes are increased.  Hoffmann's responses are present.  Plantar stimulation is equivocal.  Gait is unsteady.  Examination head ears eyes nose throat is unremarked.  Chest and abdomen are benign.  Extremities are free from injury or deformity. ?Assessment/Plan ?Cervical stenosis with myelopathy.  Plan C3-4, C4-5, C5-6 anterior cervical discectomy with interbody fusion utilizing interbody cages, local harvested autograft, and anterior plate is rotation.  Risks and benefits of been explained.  Patient wishes to proceed. ? ?Sharene Krikorian A Emmet Messer ?08/10/2021, 7:42 AM ? ? ? ?

## 2021-08-11 ENCOUNTER — Encounter (HOSPITAL_COMMUNITY): Payer: Self-pay | Admitting: Neurosurgery

## 2021-08-11 ENCOUNTER — Other Ambulatory Visit: Payer: Self-pay

## 2021-08-11 MED ORDER — CYCLOBENZAPRINE HCL 10 MG PO TABS: 10.0000 mg | ORAL_TABLET | Freq: Three times a day (TID) | ORAL | 0 refills | Status: DC | PRN

## 2021-08-11 MED ORDER — HYDROCODONE-ACETAMINOPHEN 5-325 MG PO TABS: 1.0000 | ORAL_TABLET | ORAL | 0 refills | Status: DC | PRN

## 2021-08-11 NOTE — Progress Notes (Signed)
Patient is discharged from room 3C06 at this time. Alert and in stable condition. IV site d/c'd and instructions read to patient and spouse with understanding verbalized and all questions answered. Left unit via wheelchair with all belongings at side 

## 2021-08-11 NOTE — Evaluation (Signed)
Occupational Therapy Evaluation ?Patient Details ?Name: [name] ?MRN: [id number] ?DOB: 03-02-1961 ?Today's Date: 08/11/2021 ? ? ?History of Present Illness 61 year old male who underwent C3-4, C4-5, C5-6 anterior cervical discectomy with interbody fusion 5/5. PMHx: A fib, COPD, HTN, sickle cell, SVT  ? ?Clinical Impression ?  ?[name] was evaluated s/p the above cervical sx. He is indep at baseline including working part-time and driving. He lives in a 1 level home with level entry, and plans to have family provide 24/7 assist at d/c. After review of back precautions and compensatory techniques, including AE Management consultant) education, pt verbalized great understanding. However he is experiencing bilat shoulder pain, weakness and poor ROM that limits his ability to complete ADLs indep. He required up to [facility] for UB ADLs, maxA for LB ADLs and min G for transfers and mobility with RW. Pt would benefit from OT acutely. Recommend d/c home with support of family, and OP OT should his shoulders do nto progress.  ?   ? ?Recommendations for follow up therapy are one component of a multi-disciplinary discharge planning process, led by the attending physician.  Recommendations may be updated based on patient status, additional functional criteria and insurance authorization.  ? ?Follow Up Recommendations ? No OT follow up (No OT follow up initially - if shoulder symptoms do not progress, may need OP OT.)  ?  ?Assistance Recommended at Discharge Frequent or constant Supervision/Assistance  ?Patient can return home with the following A little help with walking and/or transfers;A lot of help with bathing/dressing/bathroom;Assist for transportation;Help with stairs or ramp for entrance;Assistance with feeding;Assistance with cooking/housework ? ?  ?Functional Status Assessment ? Patient has had a recent decline in their functional status and demonstrates the ability to make significant improvements in function in a reasonable and  predictable amount of time.  ?Equipment Recommendations ? BSC/3in1;Other (comment) (RW)  ?  ?Recommendations for Other Services   ? ? ?  ?Precautions / Restrictions Precautions ?Precautions: Fall;Cervical ?Precaution Booklet Issued: Yes (comment) ?Precaution Comments: verbally reviewed ?Required Braces or Orthoses: Cervical Brace ?Cervical Brace: Soft collar ?Restrictions ?Weight Bearing Restrictions: No  ? ?  ? ?Mobility Bed Mobility ?Overal bed mobility: Needs Assistance ?Bed Mobility: Rolling, Sidelying to Sit ?Rolling: Supervision ?Sidelying to sit: Supervision ?  ?  ?  ?General bed mobility comments: cues for log roll ?  ? ?Transfers ?Overall transfer level: Needs assistance ?Equipment used: Rolling walker (2 wheels) ?Transfers: Sit to/from Stand ?Sit to Stand: Min guard ?  ?  ?  ?  ?  ?  ?  ? ?  ?Balance Overall balance assessment: Needs assistance ?Sitting-balance support: Feet supported ?Sitting balance-Leahy Scale: Fair ?  ?  ?Standing balance support: Bilateral upper extremity supported, During functional activity ?Standing balance-Leahy Scale: Poor ?  ?  ?  ?  ?  ?  ?  ?  ?  ?  ?  ?  ?   ? ?ADL either performed or assessed with clinical judgement  ? ?ADL Overall ADL's : Needs assistance/impaired ?Eating/Feeding: Set up;Sitting ?  ?Grooming: Minimal assistance;Sitting ?  ?Upper Body Bathing: Moderate assistance;Sitting ?  ?Lower Body Bathing: Moderate assistance;Sit to/from stand ?  ?Upper Body Dressing : Moderate assistance;Sitting ?  ?Lower Body Dressing: Maximal assistance;Sit to/from stand ?  ?Toilet Transfer: Min guard;Ambulation;Rolling walker (2 wheels) ?  ?Toileting- Clothing Manipulation and Hygiene: Minimal assistance;Sitting/lateral lean ?  ?  ?  ?Functional mobility during ADLs: Min guard;Rolling walker (2 wheels) ?General ADL Comments: limited by impaired and painful shoulder  ROM and weakness. pt also lethargic this date and unsteady  ? ? ? ?Vision Baseline Vision/History: 0 No visual  deficits ?Ability to See in Adequate Light: 0 Adequate ?Vision Assessment?: No apparent visual deficits  ?   ?Perception   ?  ?Praxis   ?  ? ?Pertinent Vitals/Pain Pain Assessment ?Pain Assessment: Faces ?Faces Pain Scale: Hurts little more ?Pain Location: RUE & throat ?Pain Descriptors / Indicators: Discomfort, Guarding ?Pain Intervention(s): Limited activity within patient's tolerance, Monitored during session  ? ? ? ?Hand Dominance Right ?  ?Extremity/Trunk Assessment Upper Extremity Assessment ?Upper Extremity Assessment: RUE deficits/detail;LUE deficits/detail ?RUE Deficits / Details: limited shoulder ROM, painful and weak. elbow, wrist and hand are weaker than baseline but WFL ?RUE Sensation: WNL ?RUE Coordination: decreased fine motor;decreased gross motor ?LUE Deficits / Details: limited shoulder ROM, painful and weak. elbow, wrist and hand are weaker than baseline but WFL ?LUE Sensation: WNL ?LUE Coordination: decreased fine motor;decreased gross motor ?  ?Lower Extremity Assessment ?Lower Extremity Assessment: Defer to PT evaluation ?  ?Cervical / Trunk Assessment ?Cervical / Trunk Assessment: Neck Surgery ?  ?Communication Communication ?Communication: No difficulties ?  ?Cognition Arousal/Alertness: Awake/alert, Lethargic ?Behavior During Therapy: [facility] for tasks assessed/performed ?Overall Cognitive Status: Within Functional Limits for tasks assessed ?  ?  ?  ?  ?  ?  ?  ?  ?  ?  ?  ?  ?  ?  ?  ?  ?General Comments: sleepy, but WFL otherwise. good recall of precautions and compensatory techniques after review ?  ?  ?General Comments  VSS on RA, family present and supportive ? ?  ?Exercises   ?  ?Shoulder Instructions    ? ? ?Home Living Family/patient expects to be discharged to:: Private residence ?Living Arrangements: Other relatives ?Available Help at Discharge: Available 24 hours/day ?Type of Home: House ?Home Access: Stairs to enter;Level entry ?  ?  ?Home Layout: One level ?  ?  ?Bathroom  Shower/Tub: Tub/shower unit ?  ?Bathroom Toilet: Standard ?  ?  ?Home Equipment: None ?  ?Additional Comments: family able to provide 24/7 ?  ? ?  ?Prior Functioning/Environment Prior Level of Function : Working/employed;Driving;Independent/Modified Independent ?  ?  ?  ?  ?  ?  ?Mobility Comments: no AD ?ADLs Comments: indep ADL/IADLs, works part time, drives ?  ? ?  ?  ?OT Problem List: Decreased strength;Impaired balance (sitting and/or standing);Decreased activity tolerance;Decreased range of motion;Decreased knowledge of precautions;Pain ?  ?   ?OT Treatment/Interventions: Self-care/ADL training;Therapeutic exercise;DME and/or AE instruction;Therapeutic activities;Patient/family education;Balance training  ?  ?OT Goals(Current goals can be found in the care plan section) Acute Rehab OT Goals ?Patient Stated Goal: less pain ?OT Goal Formulation: With patient ?Time For Goal Achievement: 08/25/21 ?Potential to Achieve Goals: Good ?ADL Goals ?Additional ADL Goal #1: Pt will complete BADLS with mod I  ?OT Frequency: Min 2X/week ?  ? ?Co-evaluation   ?  ?  ?  ?  ? ?  ?AM-PAC OT "6 Clicks" Daily Activity     ?Outcome Measure Help from another person eating meals?: A Little ?Help from another person taking care of personal grooming?: A Little ?Help from another person toileting, which includes using toliet, bedpan, or urinal?: A Little ?Help from another person bathing (including washing, rinsing, drying)?: A Lot ?Help from another person to put on and taking off regular upper body clothing?: A Lot ?Help from another person to put on and taking off regular lower body clothing?:  A Lot ?6 Click Score: 15 ?  ?End of Session Equipment Utilized During Treatment: Rolling walker (2 wheels);Cervical collar ?Nurse Communication: Mobility status;Other (comment) (RUE shoulder limitations) ? ?Activity Tolerance: Patient tolerated treatment well;Patient limited by pain;Patient limited by lethargy ?Patient left: in bed;with call  bell/phone within reach;with family/visitor present (NP in room) ? ?OT Visit Diagnosis: Unsteadiness on feet (R26.81);Muscle weakness (generalized) (M62.81)  ?              ?Time: 3953-2023 ?OT Time Calculation (min): 24 mi

## 2021-08-11 NOTE — Discharge Summary (Signed)
Physician Discharge Summary  ?Patient ID: ?[name] ?MRN: [id number] ?DOB/AGE: [birth year] 61 y.o. ? ?Admit date: 08/10/2021 ?Discharge date: 08/11/2021 ? ?Admission Diagnoses: Cervical stenosis with myelopathy ? ?Discharge Diagnoses: Cervical stenosis with myelopathy ?Principal Problem: ?  Cervical spondylosis with myelopathy and radiculopathy ? ? ?Discharged Condition: good ? ?Hospital Course: The patient was admitted on 08/10/2021 and taken to the operating room where the patient underwent C3/4, C4/5, C5/6 ACDF. The patient tolerated the procedure well and was taken to the recovery room and then to the floor in stable condition. The hospital course was routine. There were no complications. The wound remained clean dry and intact. Pt had appropriate upper back / incisional soreness. No complaints of arm pain or new N/T/W. The patient remained afebrile with stable vital signs, and tolerated a regular diet. The patient continued to increase activities, and pain was well controlled with oral pain medications. ? ? ?Consults: None ? ?Significant Diagnostic Studies: radiology: X-Ray: intraoperative ? ? ?Treatments: surgery: C3-4, C4-5, C5-6 anterior cervical discectomy with interbody fusion utilizing interbody cages, local harvested autograft, and anterior plate instrumentation ? ?Discharge Exam: ?Blood pressure 123/80, pulse 70, temperature 98.3 ?F (36.8 ?C), temperature source Oral, resp. rate 17, height '5\' 11"'$  (1.803 m), weight 97.5 kg, SpO2 100 %. ?Physical Exam: Patient is awake, A/O X 4, conversant, and in good spirits. Eyes open spontaneously. They are in NAD and VSS. Doing well. Speech is fluent and appropriate. MAEW. BUE 4/5 throughout, BLE 5/5 throughout. Sensation to light touch is intact. PERLA, EOMI. CNs grossly intact. Dressing is clean dry intact. Soft cervical collar in place ? ? ?Disposition: Discharge disposition: 01-Home or Self Care ? ? ? ? ? ? ? ?Allergies as of 08/11/2021   ?No Known Allergies ?   ? ?  ?Medication List  ?  ? ?TAKE these medications   ? ?acetaminophen 325 MG tablet ?Commonly known as: TYLENOL ?Take 650 mg by mouth every 6 (six) hours as needed for moderate pain. ?  ?amLODipine 5 MG tablet ?Commonly known as: NORVASC ?Take 1 tablet (5 mg total) by mouth daily. ?  ?apixaban 5 MG Tabs tablet ?Commonly known as: Eliquis ?Take 1 tablet (5 mg total) by mouth 2 (two) times daily. Take Eliquis 5 mg twice a day one week after GI bleed.  Do not restart Xarelto. ?What changed: additional instructions ?  ?cetirizine 10 MG tablet ?Commonly known as: ZYRTEC ?Take 10 mg by mouth daily as needed for allergies. ?  ?Colchicine 0.6 MG Caps ?Take 0.6 mg by mouth 2 (two) times daily as needed. Gout flares ?  ?cyclobenzaprine 10 MG tablet ?Commonly known as: FLEXERIL ?Take 1 tablet (10 mg total) by mouth 3 (three) times daily as needed for muscle spasms. ?What changed: how much to take ?  ?cyclobenzaprine 10 MG tablet ?Commonly known as: FLEXERIL ?Take 1 tablet (10 mg total) by mouth 3 (three) times daily as needed for muscle spasms. ?What changed: You were already taking a medication with the same name, and this prescription was added. Make sure you understand how and when to take each. ?  ?diclofenac 75 MG EC tablet ?Commonly known as: VOLTAREN ?Take 75 mg by mouth daily. ?  ?Emgality 120 MG/ML Soaj ?Generic drug: Galcanezumab-gnlm ?Inject 120 mg into the skin every 30 (thirty) days. ?  ?famotidine 20 MG tablet ?Commonly known as: PEPCID ?Take 40 mg by mouth daily as needed for heartburn or indigestion. ?  ?ferrous sulfate 325 (65 FE) MG tablet ?Take 1 tablet (  325 mg total) by mouth daily with breakfast. ?  ?fluticasone 50 MCG/ACT nasal spray ?Commonly known as: FLONASE ?Place 1 spray into both nostrils daily as needed for allergies or rhinitis. ?  ?Fluticasone-Salmeterol 250-50 MCG/DOSE Aepb ?Commonly known as: ADVAIR ?Inhale 1 puff into the lungs 2 (two) times daily as needed (wheezing). ?  ?gabapentin 300 MG  capsule ?Commonly known as: Neurontin ?Take 1 capsule (300 mg total) by mouth 4 (four) times daily. For nerve pain ?What changed:  ?when to take this ?reasons to take this ?additional instructions ?  ?guaiFENesin 600 MG 12 hr tablet ?Commonly known as: [place] ?Take 600 mg by mouth daily as needed for to loosen phlegm. ?  ?HYDROcodone-acetaminophen 5-325 MG tablet ?Commonly known as: NORCO/VICODIN ?Take 1-2 tablets by mouth every 4 (four) hours as needed for moderate pain. ?  ?isosorbide mononitrate 30 MG 24 hr tablet ?Commonly known as: IMDUR ?Take 30 mg by mouth daily. ?  ?linaclotide 145 MCG Caps capsule ?Commonly known as: LINZESS ?Take 145 mcg by mouth daily as needed (Constipation). ?  ?losartan 25 MG tablet ?Commonly known as: COZAAR ?Take 25 mg by mouth daily. ?  ?nitroGLYCERIN 0.4 MG SL tablet ?Commonly known as: NITROSTAT ?Place 1 tablet (0.4 mg total) under the tongue every 5 (five) minutes as needed for chest pain. ?  ?ondansetron 8 MG tablet ?Commonly known as: ZOFRAN ?Take 8 mg by mouth every 8 (eight) hours as needed for nausea or vomiting. ?  ?pantoprazole 40 MG tablet ?Commonly known as: PROTONIX ?Take 40 mg by mouth daily. ?  ?polyethylene glycol 17 g packet ?Commonly known as: MIRALAX / GLYCOLAX ?Take 17 g by mouth daily. ?What changed:  ?when to take this ?reasons to take this ?  ?sildenafil 20 MG tablet ?Commonly known as: REVATIO ?Take 60-100 mg by mouth daily as needed (ED). ?  ?sotalol 80 MG tablet ?Commonly known as: BETAPACE ?Take 1 tablet (80 mg total) by mouth every 12 (twelve) hours. ?  ?SUMAtriptan 100 MG tablet ?Commonly known as: IMITREX ?Take 100 mg by mouth as directed. Take one tablet at onset, then an additional tablet every 12 hours as needed for migraine ?  ?traMADol 50 MG tablet ?Commonly known as: ULTRAM ?Take 50 mg by mouth every 6 (six) hours as needed for moderate pain. ?  ?triamterene-hydrochlorothiazide 37.5-25 MG capsule ?Commonly known as: DYAZIDE ?Take 1 capsule by  mouth every morning. ?  ? ?  ? ?  ?  ? ? ?  ?Durable Medical Equipment  ?(From admission, onward)  ?  ? ? ?  ? ?  Start     Ordered  ? 08/11/21 0940  For home use only DME 3 n 1  Once       ? 08/11/21 0939  ? 08/11/21 0940  For home use only DME Walker rolling  Once       ?Question Answer Comment  ?Walker: With 5 Inch Wheels   ?Patient needs a walker to treat with the following condition Herniated disc, cervical   ?  ? 08/11/21 0939  ? ?  ?  ? ?  ? ? ? ?Signed: ?Marvis Moeller, DNP, NP-C ?08/11/2021, 10:01 AM ? ? ?

## 2021-08-11 NOTE — Discharge Instructions (Signed)
Wound Care Keep incision area dry.  You may remove outer bandage after 3 days and shower.   If you shower prior cover incision with plastic wrap.  Do not put any creams, lotions, or ointments on incision. Leave steri-strips on neck.  They will fall off by themselves. Activity Walk each and every day, increasing distance each day. No lifting greater than 5 lbs.  Avoid excessive neck motion. No driving for 2 weeks; may ride as a passenger locally. Wear neck brace at all times except when showering. Diet Resume your normal diet.  Return to Work Will be discussed at you follow up appointment. Call Your Doctor If Any of These Occur Redness, drainage, or swelling at the wound.  Temperature greater than 101 degrees. Severe pain not relieved by pain medication. Increased difficulty swallowing.  Incision starts to come apart. Follow Up Appt Call  [phone])  for problems.  If you have any hardware placed in your spine, you will need an x-ray before your appointment.  

## 2021-08-11 NOTE — Evaluation (Signed)
Physical Therapy Evaluation ?Patient Details ?Name: [name] ?MRN: [id number] ?DOB: 26-Oct-1960 ?Today's Date: 08/11/2021 ? ?History of Present Illness ? 61 year old male who underwent C3-4, C4-5, C5-6 anterior cervical discectomy with interbody fusion 5/5. PMHx: A fib, COPD, HTN, sickle cell, SVT  ?Clinical Impression ? PTA pt living with family in single story home with level entry. Pt completely independent, driving and working. Pt is currently limited in safe mobility by increased neck and back pain s/p sx. Pt is currently supervision for bed mobility and min guard for transfers and ambulation with RW. Pt has good family support and will be able to discharge home with not PT follow up. PT recommends BSC and RW for improved mobility and safety.  Plan for d/c home today. PT signing off.  ?   ? ?Recommendations for follow up therapy are one component of a multi-disciplinary discharge planning process, led by the attending physician.  Recommendations may be updated based on patient status, additional functional criteria and insurance authorization. ? ?Follow Up Recommendations Follow physician's recommendations for discharge plan and follow up therapies ? ?  ?Assistance Recommended at Discharge PRN  ?Patient can return home with the following ? A little help with walking and/or transfers;A little help with bathing/dressing/bathroom;Assistance with cooking/housework;Assist for transportation ? ?  ?Equipment Recommendations Rolling walker (2 wheels);BSC/3in1  ?Recommendations for Other Services ?    ?  ?Functional Status Assessment Patient has had a recent decline in their functional status and demonstrates the ability to make significant improvements in function in a reasonable and predictable amount of time.  ? ?  ?Precautions / Restrictions Precautions ?Precautions: Fall;Cervical ?Precaution Booklet Issued: Yes (comment) ?Precaution Comments: verbally reviewed ?Required Braces or Orthoses: Cervical  Brace ?Cervical Brace: Soft collar ?Restrictions ?Weight Bearing Restrictions: No  ? ?  ? ?Mobility ? Bed Mobility ?Overal bed mobility: Needs Assistance ?Bed Mobility: Rolling, Sidelying to Sit ?Rolling: Supervision ?Sidelying to sit: Supervision ?  ?  ?  ?General bed mobility comments: good carry over from OT session, minor cues for log roll ?  ? ?Transfers ?Overall transfer level: Needs assistance ?Equipment used: Rolling walker (2 wheels) ?Transfers: Sit to/from Stand ?Sit to Stand: Min guard ?  ?  ?  ?  ?  ?General transfer comment: min guard for safety, vc for hand placement, good power up increased time for steadying ?  ? ?Ambulation/Gait ?Ambulation/Gait assistance: Min guard ?Gait Distance (Feet): 50 Feet ?Assistive device: Rolling walker (2 wheels) ?Gait Pattern/deviations: Step-through pattern, Decreased step length - right, Decreased step length - left ?Gait velocity: slowed ?Gait velocity interpretation: <1.31 ft/sec, indicative of household ambulator ?  ?General Gait Details: min guard for safety with very slow, deliberate ambulation ? ? ?  ? ?Balance Overall balance assessment: Needs assistance ?Sitting-balance support: Feet supported ?Sitting balance-Leahy Scale: Fair ?  ?  ?Standing balance support: Bilateral upper extremity supported, During functional activity ?Standing balance-Leahy Scale: Poor ?  ?  ?  ?  ?  ?  ?  ?  ?  ?  ?  ?  ?   ? ? ? ?Pertinent Vitals/Pain Pain Assessment ?Pain Assessment: Faces ?Faces Pain Scale: Hurts even more ?Pain Location: RUE & throat ?Pain Descriptors / Indicators: Discomfort, Guarding ?Pain Intervention(s): Limited activity within patient's tolerance, Monitored during session, Repositioned  ? ? ?Home Living Family/patient expects to be discharged to:: Private residence ?Living Arrangements: Other relatives ?Available Help at Discharge: Available 24 hours/day ?Type of Home: House ?Home Access: Stairs to enter;Level entry ?  ?  ?  ?  Home Layout: One level ?Home  Equipment: None ?Additional Comments: family able to provide 24/7  ?  ?Prior Function Prior Level of Function : Working/employed;Driving;Independent/Modified Independent ?  ?  ?  ?  ?  ?  ?Mobility Comments: no AD ?ADLs Comments: indep ADL/IADLs, works part time, drives ?  ? ? ?Hand Dominance  ? Dominant Hand: Right ? ?  ?Extremity/Trunk Assessment  ? Upper Extremity Assessment ?Upper Extremity Assessment: Defer to OT evaluation ?RUE Deficits / Details: limited shoulder ROM, painful and weak. elbow, wrist and hand are weaker than baseline but WFL ?RUE Sensation: WNL ?RUE Coordination: decreased fine motor;decreased gross motor ?LUE Deficits / Details: limited shoulder ROM, painful and weak. elbow, wrist and hand are weaker than baseline but WFL ?LUE Sensation: WNL ?LUE Coordination: decreased fine motor;decreased gross motor ?  ? ?Lower Extremity Assessment ?Lower Extremity Assessment: Generalized weakness;Overall [facility] for tasks assessed ?  ? ?Cervical / Trunk Assessment ?Cervical / Trunk Assessment: Neck Surgery  ?Communication  ? Communication: No difficulties  ?Cognition Arousal/Alertness: Awake/alert, Lethargic ?Behavior During Therapy: [facility] for tasks assessed/performed ?Overall Cognitive Status: Within Functional Limits for tasks assessed ?  ?  ?  ?  ?  ?  ?  ?  ?  ?  ?  ?  ?  ?  ?  ?  ?General Comments: sleepy, but WFL otherwise. good recall of precautions and compensatory techniques after review ?  ?  ? ?  ?General Comments General comments (skin integrity, edema, etc.): VSS on RA, significant other in room ? ?  ?   ? ?Assessment/Plan  ?  ?PT Assessment Patient does not need any further PT services  ?   ?   ? ?PT Goals (Current goals can be found in the Care Plan section)  ?Acute Rehab PT Goals ?Patient Stated Goal: go home ?PT Goal Formulation: With patient/family ?Time For Goal Achievement: 08/25/21 ?Potential to Achieve Goals: Good ? ?  ? ?AM-PAC PT "6 Clicks" Mobility  ?Outcome Measure Help needed turning  from your back to your side while in a flat bed without using bedrails?: None ?Help needed moving from lying on your back to sitting on the side of a flat bed without using bedrails?: None ?Help needed moving to and from a bed to a chair (including a wheelchair)?: None ?Help needed standing up from a chair using your arms (e.g., wheelchair or bedside chair)?: None ?Help needed to walk in hospital room?: None ?Help needed climbing 3-5 steps with a railing? : A Little ?6 Click Score: 23 ? ?  ?End of Session Equipment Utilized During Treatment: Gait belt ?Activity Tolerance: Patient tolerated treatment well ?Patient left: with call bell/phone within reach;with family/visitor present;Other (comment) (sitting on EoB awaiting d/c) ?Nurse Communication: Mobility status;Other (comment) (ready for discharge) ?PT Visit Diagnosis: Difficulty in walking, not elsewhere classified (R26.2) ?  ? ?Time: 1275-1700 ?PT Time Calculation (min) (ACUTE ONLY): 13 min ? ? ?Charges:   PT Evaluation ?$PT Eval Moderate Complexity: 1 Mod ?  ?  ?   ? ? ?Marrisa Kimber B. Migdalia Dk PT, DPT ?Acute Rehabilitation Services ?Please use secure chat or  ?Call Office [phone] ? ? ?Bailey Mech Fleet ?08/11/2021, 12:56 PM ? ?

## 2021-08-13 ENCOUNTER — Encounter (HOSPITAL_COMMUNITY): Payer: Self-pay | Admitting: Neurosurgery

## 2021-08-13 MED FILL — Thrombin For Soln 5000 Unit: CUTANEOUS | Qty: 5000 | Status: AC

## 2021-08-14 ENCOUNTER — Encounter (HOSPITAL_BASED_OUTPATIENT_CLINIC_OR_DEPARTMENT_OTHER): Payer: Self-pay | Admitting: Cardiology

## 2021-08-16 ENCOUNTER — Inpatient Hospital Stay (HOSPITAL_COMMUNITY)
Admission: EM | Admit: 2021-08-16 | Discharge: 2021-08-19 | DRG: 070 | Disposition: A | Discharge status: Home / Self Care | Payer: Medicare Other | Attending: Family Medicine | Admitting: Family Medicine

## 2021-08-16 ENCOUNTER — Other Ambulatory Visit: Payer: Self-pay

## 2021-08-16 ENCOUNTER — Emergency Department (HOSPITAL_COMMUNITY): Payer: Medicare Other

## 2021-08-16 ENCOUNTER — Encounter (HOSPITAL_COMMUNITY): Payer: Self-pay

## 2021-08-16 DIAGNOSIS — J449 Chronic obstructive pulmonary disease, unspecified: Secondary | ICD-10-CM | POA: Diagnosis present

## 2021-08-16 DIAGNOSIS — G9389 Other specified disorders of brain: Principal | ICD-10-CM | POA: Diagnosis present

## 2021-08-16 DIAGNOSIS — I129 Hypertensive chronic kidney disease with stage 1 through stage 4 chronic kidney disease, or unspecified chronic kidney disease: Secondary | ICD-10-CM | POA: Diagnosis present

## 2021-08-16 DIAGNOSIS — N400 Enlarged prostate without lower urinary tract symptoms: Secondary | ICD-10-CM | POA: Diagnosis present

## 2021-08-16 DIAGNOSIS — I480 Paroxysmal atrial fibrillation: Secondary | ICD-10-CM | POA: Diagnosis present

## 2021-08-16 DIAGNOSIS — I10 Essential (primary) hypertension: Secondary | ICD-10-CM | POA: Diagnosis present

## 2021-08-16 DIAGNOSIS — N1831 Chronic kidney disease, stage 3a: Secondary | ICD-10-CM | POA: Diagnosis present

## 2021-08-16 DIAGNOSIS — R4182 Altered mental status, unspecified: Secondary | ICD-10-CM | POA: Diagnosis not present

## 2021-08-16 DIAGNOSIS — G9340 Encephalopathy, unspecified: Secondary | ICD-10-CM | POA: Diagnosis not present

## 2021-08-16 DIAGNOSIS — G9689 Other specified disorders of central nervous system: Secondary | ICD-10-CM

## 2021-08-16 DIAGNOSIS — Z981 Arthrodesis status: Secondary | ICD-10-CM

## 2021-08-16 DIAGNOSIS — Z20822 Contact with and (suspected) exposure to covid-19: Secondary | ICD-10-CM | POA: Diagnosis present

## 2021-08-16 DIAGNOSIS — D573 Sickle-cell trait: Secondary | ICD-10-CM | POA: Diagnosis present

## 2021-08-16 DIAGNOSIS — R9089 Other abnormal findings on diagnostic imaging of central nervous system: Secondary | ICD-10-CM

## 2021-08-16 DIAGNOSIS — Z832 Family history of diseases of the blood and blood-forming organs and certain disorders involving the immune mechanism: Secondary | ICD-10-CM

## 2021-08-16 DIAGNOSIS — Z7901 Long term (current) use of anticoagulants: Secondary | ICD-10-CM

## 2021-08-16 DIAGNOSIS — G4730 Sleep apnea, unspecified: Secondary | ICD-10-CM | POA: Diagnosis present

## 2021-08-16 DIAGNOSIS — G9341 Metabolic encephalopathy: Secondary | ICD-10-CM | POA: Diagnosis present

## 2021-08-16 DIAGNOSIS — K219 Gastro-esophageal reflux disease without esophagitis: Secondary | ICD-10-CM | POA: Diagnosis present

## 2021-08-16 DIAGNOSIS — N179 Acute kidney failure, unspecified: Secondary | ICD-10-CM | POA: Diagnosis present

## 2021-08-16 DIAGNOSIS — K5900 Constipation, unspecified: Secondary | ICD-10-CM | POA: Diagnosis not present

## 2021-08-16 DIAGNOSIS — Z79899 Other long term (current) drug therapy: Secondary | ICD-10-CM

## 2021-08-16 DIAGNOSIS — Z8249 Family history of ischemic heart disease and other diseases of the circulatory system: Secondary | ICD-10-CM

## 2021-08-16 LAB — CBC
HCT: 36.6 % — ABNORMAL LOW (ref 39.0–52.0)
Hemoglobin: 12.5 g/dL — ABNORMAL LOW (ref 13.0–17.0)
MCH: 28.9 pg (ref 26.0–34.0)
MCHC: 34.2 g/dL (ref 30.0–36.0)
MCV: 84.5 fL (ref 80.0–100.0)
Platelets: 272 10*3/uL (ref 150–400)
RBC: 4.33 MIL/uL (ref 4.22–5.81)
RDW: 12.5 % (ref 11.5–15.5)
WBC: 6.6 10*3/uL (ref 4.0–10.5)
nRBC: 0 % (ref 0.0–0.2)

## 2021-08-16 LAB — RAPID URINE DRUG SCREEN, HOSP PERFORMED
Amphetamines: NOT DETECTED
Barbiturates: NOT DETECTED
Benzodiazepines: NOT DETECTED
Cocaine: NOT DETECTED
Opiates: POSITIVE — AB
Tetrahydrocannabinol: NOT DETECTED

## 2021-08-16 LAB — URINALYSIS, ROUTINE W REFLEX MICROSCOPIC
Bacteria, UA: NONE SEEN
Bilirubin Urine: NEGATIVE
Glucose, UA: NEGATIVE mg/dL
Ketones, ur: NEGATIVE mg/dL
Leukocytes,Ua: NEGATIVE
Nitrite: NEGATIVE
Protein, ur: NEGATIVE mg/dL
Specific Gravity, Urine: 1.018 (ref 1.005–1.030)
pH: 7 (ref 5.0–8.0)

## 2021-08-16 LAB — DIFFERENTIAL
Abs Immature Granulocytes: 0.03 10*3/uL (ref 0.00–0.07)
Basophils Absolute: 0 10*3/uL (ref 0.0–0.1)
Basophils Relative: 1 %
Eosinophils Absolute: 0.4 10*3/uL (ref 0.0–0.5)
Eosinophils Relative: 6 %
Immature Granulocytes: 1 %
Lymphocytes Relative: 32 %
Lymphs Abs: 2.1 10*3/uL (ref 0.7–4.0)
Monocytes Absolute: 0.8 10*3/uL (ref 0.1–1.0)
Monocytes Relative: 12 %
Neutro Abs: 3.2 10*3/uL (ref 1.7–7.7)
Neutrophils Relative %: 48 %

## 2021-08-16 LAB — I-STAT ARTERIAL BLOOD GAS, ED
Acid-Base Excess: 5 mmol/L — ABNORMAL HIGH (ref 0.0–2.0)
Bicarbonate: 29.2 mmol/L — ABNORMAL HIGH (ref 20.0–28.0)
Calcium, Ion: 1.21 mmol/L (ref 1.15–1.40)
HCT: 34 % — ABNORMAL LOW (ref 39.0–52.0)
Hemoglobin: 11.6 g/dL — ABNORMAL LOW (ref 13.0–17.0)
O2 Saturation: 98 %
Patient temperature: 98
Potassium: 3.2 mmol/L — ABNORMAL LOW (ref 3.5–5.1)
Sodium: 137 mmol/L (ref 135–145)
TCO2: 30 mmol/L (ref 22–32)
pCO2 arterial: 40.9 mmHg (ref 32–48)
pH, Arterial: 7.461 — ABNORMAL HIGH (ref 7.35–7.45)
pO2, Arterial: 93 mmHg (ref 83–108)

## 2021-08-16 LAB — COMPREHENSIVE METABOLIC PANEL
ALT: 22 U/L (ref 0–44)
AST: 33 U/L (ref 15–41)
Albumin: 3.9 g/dL (ref 3.5–5.0)
Alkaline Phosphatase: 70 U/L (ref 38–126)
Anion gap: 10 (ref 5–15)
BUN: 15 mg/dL (ref 8–23)
CO2: 27 mmol/L (ref 22–32)
Calcium: 9.4 mg/dL (ref 8.9–10.3)
Chloride: 100 mmol/L (ref 98–111)
Creatinine, Ser: 1.74 mg/dL — ABNORMAL HIGH (ref 0.61–1.24)
GFR, Estimated: 44 mL/min — ABNORMAL LOW (ref >60)
Glucose, Bld: 109 mg/dL — ABNORMAL HIGH (ref 70–99)
Potassium: 3.9 mmol/L (ref 3.5–5.1)
Sodium: 137 mmol/L (ref 135–145)
Total Bilirubin: 0.7 mg/dL (ref 0.3–1.2)
Total Protein: 7.6 g/dL (ref 6.5–8.1)

## 2021-08-16 LAB — RESP PANEL BY RT-PCR (FLU A&B, COVID) ARPGX2
Influenza A by PCR: NEGATIVE
Influenza B by PCR: NEGATIVE
SARS Coronavirus 2 by RT PCR: NEGATIVE

## 2021-08-16 LAB — PROTIME-INR
INR: 1 (ref 0.8–1.2)
Prothrombin Time: 13.2 seconds (ref 11.4–15.2)

## 2021-08-16 LAB — I-STAT CHEM 8, ED
BUN: 20 mg/dL (ref 8–23)
Calcium, Ion: 1.09 mmol/L — ABNORMAL LOW (ref 1.15–1.40)
Chloride: 100 mmol/L (ref 98–111)
Creatinine, Ser: 1.8 mg/dL — ABNORMAL HIGH (ref 0.61–1.24)
Glucose, Bld: 105 mg/dL — ABNORMAL HIGH (ref 70–99)
HCT: 36 % — ABNORMAL LOW (ref 39.0–52.0)
Hemoglobin: 12.2 g/dL — ABNORMAL LOW (ref 13.0–17.0)
Potassium: 3.9 mmol/L (ref 3.5–5.1)
Sodium: 138 mmol/L (ref 135–145)
TCO2: 31 mmol/L (ref 22–32)

## 2021-08-16 LAB — CBG MONITORING, ED: Glucose-Capillary: 110 mg/dL — ABNORMAL HIGH (ref 70–99)

## 2021-08-16 LAB — ETHANOL: Alcohol, Ethyl (B): <10 mg/dL (ref <10)

## 2021-08-16 LAB — APTT: aPTT: 32 seconds (ref 24–36)

## 2021-08-16 LAB — HEPARIN LEVEL (UNFRACTIONATED): Heparin Unfractionated: <0.1 IU/mL — ABNORMAL LOW (ref 0.30–0.70)

## 2021-08-16 MED ORDER — SODIUM CHLORIDE 0.9 % IV SOLN
2.0000 g | INTRAVENOUS | Status: DC
  Administered 2021-08-16 – 2021-08-18 (×10): 2 g via INTRAVENOUS
  Filled 2021-08-16 (×14): qty 2000

## 2021-08-16 MED ORDER — NALOXONE HCL 0.4 MG/ML IJ SOLN
INTRAMUSCULAR | Status: AC
  Administered 2021-08-16: 0.4 mg
  Filled 2021-08-16: qty 1

## 2021-08-16 MED ORDER — VANCOMYCIN HCL 10 G IV SOLR
2250.0000 mg | Freq: Once | INTRAVENOUS | Status: AC
  Administered 2021-08-17: 2250 mg via INTRAVENOUS
  Filled 2021-08-16: qty 2250

## 2021-08-16 MED ORDER — LORAZEPAM 2 MG/ML IJ SOLN
2.0000 mg | Freq: Once | INTRAMUSCULAR | Status: AC
  Administered 2021-08-16: 2 mg via INTRAVENOUS
  Filled 2021-08-16: qty 1

## 2021-08-16 MED ORDER — VANCOMYCIN HCL 750 MG/150ML IV SOLN
750.0000 mg | Freq: Two times a day (BID) | INTRAVENOUS | Status: DC
  Administered 2021-08-17 – 2021-08-18 (×3): 750 mg via INTRAVENOUS
  Filled 2021-08-16 (×3): qty 150

## 2021-08-16 MED ORDER — SODIUM CHLORIDE 0.9 % IV SOLN
2.0000 g | Freq: Two times a day (BID) | INTRAVENOUS | Status: DC
  Administered 2021-08-16 – 2021-08-18 (×4): 2 g via INTRAVENOUS
  Filled 2021-08-16 (×5): qty 20

## 2021-08-16 MED ORDER — SODIUM CHLORIDE 0.9 % IV SOLN
4500.0000 mg | Freq: Once | INTRAVENOUS | Status: AC
  Administered 2021-08-16: 4500 mg via INTRAVENOUS
  Filled 2021-08-16: qty 45

## 2021-08-16 MED ORDER — IOHEXOL 350 MG/ML SOLN
60.0000 mL | Freq: Once | INTRAVENOUS | Status: AC | PRN
  Administered 2021-08-16: 60 mL via INTRAVENOUS

## 2021-08-16 MED ORDER — SODIUM CHLORIDE 0.9 % IV SOLN: 2.0000 g | Freq: Once | INTRAVENOUS | Status: DC

## 2021-08-16 NOTE — ED Notes (Signed)
Xray tech at patient bedside. 

## 2021-08-16 NOTE — Consult Note (Addendum)
Neurology Attending Attestation ?  ?I examined the patient and discussed plan with Ms. Ronnald Ramp NP. I have edited the note below to reflect my findings, examination, and recommendations. I was present throughout the stroke code and made all significant decisions and personally reviewed CNS imaging and also discussed CTA results with radiologist by phone.  ? ?Patient with hx C3-4 ACDF 6 days ago presented as stroke code for AMS and weakness x4 extremities. LKW 1600. On arrival to ED patient had fixed gaze and was unable to look in any direction. (-) oculocephalics. He was able to shut eyes and stick out tongue on command. Able to slightly wiggle fingers and toes to command but no movement anti-gravity in any extremity. TNK was not administered 2/2 contraindication of ACDF within past 6 days. CT head NAICP. CTA showed no LVO. CNS imaging personally reviewed.  ? ?Vital signs stable and airway protected. Stroke remains on the differential although his CTA showed no LVO and he has a vertical gaze palsy as well as horizontal which speaks against pontine infarct. Narcan was given with no improvement. Concern for seizure. He had no improvement to 69m IV ativan and is currently getting 4.5g keppra. I was not more aggressive in empiric tx possible nonconvulsive seizure because vital signs currently stable. ABG unremarkable. STAT EEG ordered to be f/b cEEG with MR compatible leads. Unless clear metabolic or infectious source is identified in ED he will likely require LP in ED prior to admission given neurosurgery within the past week. Neurology will f/u after STAT EEG read with further guidance.  ?  ?This patient is critically ill and at significant risk of neurological worsening, death and care requires constant monitoring of vital signs, hemodynamics,respiratory and cardiac monitoring, neurological assessment, discussion with family, other specialists and medical decision making of high complexity. I spent 90 minutes of  neurocritical care time  in the care of  this patient. This was time spent independent of any time provided by nurse practitioner or PA. ?  ?CSu Monks MD ?[facility] ?[phone]?  ?If 7pm- 7am, please page neurology on call as listed [place] ? ? ?Neurology Code Stroke Consult H&P ? ?RFerd Glassing?MR# [id number]?08/16/2021 ? ? ?CC: 61year old male with recent hx of progressive neck pain with bilateral upper extremity sensory loss, loss of coordination and fine motor control, and some weakness s/p C3/4, C4/5, C5/6 ACDF on 08/10/21. Discharged home and doing well. Today while outside, he told his sister he was tired and wanted to lay down around 1600. Patient was at post op baseline, alert, oriented able to walk with walker at that time. At around 170daughter checked on him and he was lethargic and soon he became unresponsive. [facility].  He was awake and alert although mute at presentation. ? ?History is obtained from: EMS, Code stroke documentation, Patient's sister, daughter and chart. ? ?HPI: RNITESH PITSTICKis a 61y.o. male PMHx that includes PAF on Eliquis and held prior to surgery and was told to restart on 08/17/21, COPD, enlarged prostate, GERD, GI bleeding, HTN, Sickle cell trait and sleep apnea on home CPAP. Patient from home where he was LKW at 08/16/21 1600 and now complaining of lethargy and weakness. Patient had an ACDF one week ago tomorrow. He was outside with family and told them he felt really tired. He went in the house to lay down and when family went in 326  min later he was not responding to them and staring up at the ceiling. EMS was called and activated the code stroke. Patient had been taking muscle relaxors and Hydrocodone for pain relief post-surgery but family said he had not taken anything since before breakfast this am. Pt without a BM for about a week now and had caused pt some discomfort per the sister. He was also  having trouble swallowing and would only eat small meals.  ? ?On arrival to ED patient had fixed gaze and was unable to look in any direction. (-) oculocephalics. He was able to shut eyes and stick out tongue on command. Able to slightly wiggle fingers and toes to command but no movement anti-gravity in any extremity. LKW 1600. TNK was not administered 2/2 contraindication of ACDF within past 6 days. CT head NAICP. CTA showed no LVO. CNS imaging personally reviewed. Narcan administered without response. 42m IV ativan administered without improvement. Patient is currently getting 4.5g keppra empiric loading for status epilepticus. STAT EEG ordered and is pending.  ? ?Patient is on eliquis for a fib which was held for procedure and not restarted (plan was to restart tmrw).  ?   ? ?RHKV:[id number] assess due to Lethargy/aphasia ? ?Past Medical History:  ?Diagnosis Date  ? [place]-fib ([place]   ? Chest pain   ? a. 11/2002 Cath: EF 68%, LM nl, LAD small, nl, LCX nl, RCA tortuous/nl;  b. 08/2011 St Echo: EF 60%, normal contractility, no scar/ischemia.  ? COPD (chronic obstructive pulmonary disease) ([place]   ? Dysrhythmia   ? Enlarged prostate   ? GERD (gastroesophageal reflux disease)   ? GI bleeding 08/2018  ? Hypertension   ? Sickle cell trait ([place]   ? Sleep apnea   ? SVT (supraventricular tachycardia) ([place]   ? ? ? ?Family History  ?Problem Relation Age of Onset  ? CAD Mother   ?     died in her 763's- MI  ? Multiple myeloma Brother   ? Sickle cell anemia Father   ?     died @ 395 ? Gastric cancer Neg Hx   ? ? ?Social History:  reports that he has never smoked. He has never used smokeless tobacco. He reports that he does not drink alcohol and does not use drugs. ? ? ?Prior to Admission medications   ?Medication Sig Start Date End Date Taking? Authorizing Provider  ?acetaminophen (TYLENOL) 325 MG tablet Take 650 mg by mouth every 6 (six) hours as needed for moderate pain.    [provider]  ?amLODipine (NORVASC) 5 MG  tablet Take 1 tablet (5 mg total) by mouth daily. 04/30/19 08/08/21  HKayleen Memos DO  ?apixaban (ELIQUIS) 5 MG TABS tablet Take 1 tablet (5 mg total) by mouth 2 (two) times daily. Take Eliquis 5 mg twice a day one week after GI bleed.  Do not restart Xarelto. ?Patient taking differently: Take 5 mg by mouth 2 (two) times daily. 05/06/19   HKayleen Memos DO  ?cetirizine (ZYRTEC) 10 MG tablet Take 10 mg by mouth daily as needed for allergies.     [provider]  ?Colchicine 0.6 MG CAPS Take 0.6 mg by mouth 2 (two) times daily as needed. Gout flares 01/17/20   [provider]  ?cyclobenzaprine (FLEXERIL) 10 MG tablet Take 1 tablet (10 mg total) by mouth 3 (three) times daily as needed for muscle spasms. 08/11/21   MMarvis Moeller NP  ?cyclobenzaprine (FLEXERIL) 10 MG  tablet Take 1 tablet (10 mg total) by mouth 3 (three) times daily as needed for muscle spasms. 08/11/21   Marvis Moeller, NP  ?diclofenac (VOLTAREN) 75 MG EC tablet Take 75 mg by mouth daily. 07/18/21   [provider]  ?EMGALITY 120 MG/ML SOAJ Inject 120 mg into the skin every 30 (thirty) days. 07/08/21   [provider]  ?famotidine (PEPCID) 20 MG tablet Take 40 mg by mouth daily as needed for heartburn or indigestion.    [provider]  ?ferrous sulfate 325 (65 FE) MG tablet Take 1 tablet (325 mg total) by mouth daily with breakfast. 04/30/19 08/08/21  Kayleen Memos, DO  ?fluticasone (FLONASE) 50 MCG/ACT nasal spray Place 1 spray into both nostrils daily as needed for allergies or rhinitis.    [provider]  ?Fluticasone-Salmeterol (ADVAIR) 250-50 MCG/DOSE AEPB Inhale 1 puff into the lungs 2 (two) times daily as needed (wheezing).    [provider]  ?gabapentin (NEURONTIN) 300 MG capsule Take 1 capsule (300 mg total) by mouth 4 (four) times daily. For nerve pain ?Patient taking differently: Take 300 mg by mouth 2 (two) times daily as needed (For nerve pain). 07/09/21   Lovorn, Jinny Blossom, MD   ?guaiFENesin (MUCINEX) 600 MG 12 hr tablet Take 600 mg by mouth daily as needed for to loosen phlegm. 05/18/21   [provider]  ?HYDROcodone-acetaminophen (NORCO/VICODIN) 5-325 MG tablet Take 1-2 tablets

## 2021-08-16 NOTE — ED Notes (Signed)
Procedural consent for lumbar puncture obtained from family. Signed consent give to NS to place with medical records documents. ?

## 2021-08-16 NOTE — Progress Notes (Signed)
STAT EEG complete - results pending. ? ?

## 2021-08-16 NOTE — Code Documentation (Addendum)
Stroke Response Nurse Documentation ?Code Documentation ? ?[name] is a 61 y.o. male arriving to [facility]  via [facility] on 08/16/21 with past medical hx of A-fib (on Eliquis), COPD, GERD, HTN, GI bleed, recent ACDF 6 days ago. On Eliquis (apixaban) daily. Code stroke was activated by ED.  ? ?Patient from home where he was LKW at 08/16/21 1600 and now complaining of lethargy and weakness. Patient had an ACDF one week ago tomorrow. He was outside with family and told them he felt really tired. He went in the house to lay down and when family went in 30 min later he was not responding to them and staring up at the ceiling. EMS was called and activated the code stroke. Patient had been taking muscle relaxors and Hydrocodone for pain relief post-surgery but family said he had not taken anything since before breakfast this am. Pt without a BM for about a week now and had caused pt some discomfort per the sister. He was also having trouble swallowing and would only eat small meals.  ? ?Stroke team at the bedside on patient arrival. Labs drawn and EDP requested pt be taken to room first to clear his airway. ED PA came to see pt and cleared [place] for CT. Patient to CT with team. NIHSS 31, see documentation for details and code stroke times. Patient with decreased LOC, disoriented, not following commands, left gaze preference , right hemianopia, right facial droop, bilateral arm weakness, bilateral leg weakness, bilateral decreased sensation, Expressive aphasia , dysarthria , and Visual  neglect on exam. The following imaging was completed:  CT Head and CTA. Neurologist requested EDP come clear patient's airway and do a formal exam once we got back to the room. Dr. Eulis Foster notified and saw pt. Pt on 2L [facility] 02 and in no respiratory distress.  ? ?Patient is not a candidate for IV Thrombolytic due to recent spinal surgery within the week and on recently on Eliquis. Patient is not a candidate for IR due to no LVO per MD.  Narcan, Ativan, and Keppra given. Waiting for EEG to set up.  ? ?Care Plan: Q2 vitals/neuro checks, EEG.  ? ?Bedside handoff with ED RN Lysbeth Galas.   ? ?Scotti Kosta, Rande Brunt  ?Stroke Response RN ? ? ?

## 2021-08-16 NOTE — ED Notes (Signed)
Sister and daughter at bedside ?

## 2021-08-16 NOTE — ED Triage Notes (Signed)
Pt bib [facility] from home where he is staying with his sister after having a spinal surgery 6 days ago. Pt has not been eating well and has not had a bowel movement in a week. Pt is taking hydrocodone at home from surgery. Pt was with family today and stated that he "felt tired", after he said this he went to lay down on the couch and went unresponsive at 1630. Pt arrives able to open his eyes and follow some commands but is unable to move his eyes and speak.  ?CODE stroke called and pt taken to room to be evaluated by EDP before moving to [place].  ?

## 2021-08-16 NOTE — Procedures (Signed)
Patient Name: [name]  ?MRN: [id number]  ?Epilepsy Attending: Lora Havens  ?Referring Physician/Provider: Clarice Pole, NP ?Date: 08/16/2021 ?Duration: 21.20 mins ? ?Patient history: 61 y.o. male PMHx that includes PAF on Eliquis and held prior to surgery and was told to restart on 08/17/21 who presents with fixed gaze, severe aphasia (mute) weakness in all 4 extremities. He is able to follow commands. EEG to evaluate for seizure ? ?Level of alertness: Awake, asleep ? ?AEDs during EEG study: None ? ?Technical aspects: This EEG study was done with scalp electrodes positioned according to the 10-20 International system of electrode placement. Electrical activity was acquired at a sampling rate of '500Hz'$  and reviewed with a high frequency filter of '70Hz'$  and a low frequency filter of '1Hz'$ . EEG data were recorded continuously and digitally stored.  ? ?Description: The posterior dominant rhythm consists of 9 Hz activity of moderate voltage (25-35 uV) seen predominantly in posterior head regions, symmetric and reactive to eye opening and eye closing. Sleep was characterized by vertex waves, sleep spindles (12 to 14 Hz), maximal frontocentral region. Hyperventilation and photic stimulation were not performed.    ? ?IMPRESSION: ?This study is within normal limits. No seizures or epileptiform discharges were seen throughout the recording. ? ?Lora Havens  ? ?

## 2021-08-16 NOTE — ED Provider Notes (Signed)
?[place] ?Provider Note ? ? ?CSN: [id number] ?Arrival date & time: 08/16/21  1736 ? ?An emergency department physician performed an initial assessment on this suspected stroke patient at 42. ? ?History ? ?No chief complaint on file. ? ? ?[name] is a 61 y.o. male. ? ?HPI ?Patient was initially cleared at the bridge by Dr. Laverta Baltimore, after arriving as code stroke for altered mental status. ? ?5: 00 p.m.-I evaluated the patient after his initial CT images were complete.  No acute abnormalities were visualized.  I Camitta directly with Dr. Quinn Axe, the neuro hospitalist who is currently managing his case, in the room. ? ?Home Medications ?Prior to Admission medications   ?Medication Sig Start Date End Date Taking? Authorizing Provider  ?acetaminophen (TYLENOL) 325 MG tablet Take 650 mg by mouth every 6 (six) hours as needed for moderate pain.    [provider]  ?amLODipine (NORVASC) 5 MG tablet Take 1 tablet (5 mg total) by mouth daily. 04/30/19 08/08/21  Kayleen Memos, DO  ?apixaban (ELIQUIS) 5 MG TABS tablet Take 1 tablet (5 mg total) by mouth 2 (two) times daily. Take Eliquis 5 mg twice a day one week after GI bleed.  Do not restart Xarelto. ?Patient taking differently: Take 5 mg by mouth 2 (two) times daily. 05/06/19   Kayleen Memos, DO  ?cetirizine (ZYRTEC) 10 MG tablet Take 10 mg by mouth daily as needed for allergies.     [provider]  ?Colchicine 0.6 MG CAPS Take 0.6 mg by mouth 2 (two) times daily as needed. Gout flares 01/17/20   [provider]  ?cyclobenzaprine (FLEXERIL) 10 MG tablet Take 1 tablet (10 mg total) by mouth 3 (three) times daily as needed for muscle spasms. 08/11/21   Marvis Moeller, NP  ?cyclobenzaprine (FLEXERIL) 10 MG tablet Take 1 tablet (10 mg total) by mouth 3 (three) times daily as needed for muscle spasms. 08/11/21   Marvis Moeller, NP  ?diclofenac (VOLTAREN) 75 MG EC tablet Take 75 mg by mouth daily. 07/18/21    [provider]  ?EMGALITY 120 MG/ML SOAJ Inject 120 mg into the skin every 30 (thirty) days. 07/08/21   [provider]  ?famotidine (PEPCID) 20 MG tablet Take 40 mg by mouth daily as needed for heartburn or indigestion.    [provider]  ?ferrous sulfate 325 (65 FE) MG tablet Take 1 tablet (325 mg total) by mouth daily with breakfast. 04/30/19 08/08/21  Kayleen Memos, DO  ?fluticasone (FLONASE) 50 MCG/ACT nasal spray Place 1 spray into both nostrils daily as needed for allergies or rhinitis.    [provider]  ?Fluticasone-Salmeterol (ADVAIR) 250-50 MCG/DOSE AEPB Inhale 1 puff into the lungs 2 (two) times daily as needed (wheezing).    [provider]  ?gabapentin (NEURONTIN) 300 MG capsule Take 1 capsule (300 mg total) by mouth 4 (four) times daily. For nerve pain ?Patient taking differently: Take 300 mg by mouth 2 (two) times daily as needed (For nerve pain). 07/09/21   Lovorn, Jinny Blossom, MD  ?guaiFENesin (MUCINEX) 600 MG 12 hr tablet Take 600 mg by mouth daily as needed for to loosen phlegm. 05/18/21   [provider]  ?HYDROcodone-acetaminophen (NORCO/VICODIN) 5-325 MG tablet Take 1-2 tablets by mouth every 4 (four) hours as needed for moderate pain. 08/11/21 08/11/22  Marvis Moeller, NP  ?isosorbide mononitrate (IMDUR) 30 MG 24 hr tablet Take 30 mg by mouth daily. 02/16/21   [provider]  ?linaclotide (LINZESS) 145 MCG CAPS capsule Take 145 mcg by mouth daily as needed (Constipation). 10/07/19   [provider]  ?losartan (COZAAR) 25 MG tablet Take 25 mg by mouth daily. 07/18/21   [provider]  ?nitroGLYCERIN (NITROSTAT) 0.4 MG SL tablet Place 1 tablet (0.4 mg total) under the tongue every 5 (five) minutes as needed for chest pain. 03/12/13   Thurnell Lose, MD  ?ondansetron (ZOFRAN) 8 MG tablet Take 8 mg by mouth every 8 (eight) hours as needed for nausea or vomiting.  10/20/18   [provider]  ?pantoprazole (PROTONIX)  40 MG tablet Take 40 mg by mouth daily. 07/28/14   [provider]  ?polyethylene glycol (MIRALAX / GLYCOLAX) 17 g packet Take 17 g by mouth daily. ?Patient taking differently: Take 17 g by mouth daily as needed for mild constipation. 04/30/19   Kayleen Memos, DO  ?sildenafil (REVATIO) 20 MG tablet Take 60-100 mg by mouth daily as needed (ED). 11/25/18   [provider]  ?sotalol (BETAPACE) 80 MG tablet Take 1 tablet (80 mg total) by mouth every 12 (twelve) hours. 04/29/19 08/08/21  Kayleen Memos, DO  ?SUMAtriptan (IMITREX) 100 MG tablet Take 100 mg by mouth as directed. Take one tablet at onset, then an additional tablet every 12 hours as needed for migraine 02/04/20   [provider]  ?traMADol (ULTRAM) 50 MG tablet Take 50 mg by mouth every 6 (six) hours as needed for moderate pain.    [provider]  ?triamterene-hydrochlorothiazide (DYAZIDE) 37.5-25 MG capsule Take 1 capsule by mouth every morning. 11/27/19   [provider]  ?   ? ?Allergies    ?Patient has no known allergies.   ? ?Review of Systems   ?Review of Systems ? ?Physical Exam ?Updated Vital Signs ?BP 120/90   Pulse 66   Temp 98 ?F (36.7 ?C) (Oral)   Resp 17   SpO2 100%  ?Physical Exam ?Vitals and nursing note reviewed.  ?Constitutional:   ?   Appearance: He is well-developed. He is obese. He is not ill-appearing.  ?HENT:  ?   Head: Normocephalic and atraumatic.  ?   Right Ear: External ear normal.  ?   Left Ear: External ear normal.  ?Eyes:  ?   Conjunctiva/sclera: Conjunctivae normal.  ?   Comments: Pupils 1 to 2 mm bilaterally.  No eye movement on command.  ?Neck:  ?   Trachea: Phonation normal.  ?   Comments: Right-sided mid to lower anterior neck surgical site, approximated with Steri-Strips.  No localized swelling or mass. ?Cardiovascular:  ?   Rate and Rhythm: Normal rate and regular rhythm.  ?   Heart sounds: Normal heart sounds.  ?Pulmonary:  ?   Effort: Pulmonary effort is normal. No respiratory  distress.  ?   Breath sounds: Normal breath sounds. No stridor.  ?Abdominal:  ?   General: There is no distension.  ?   Palpations: Abdomen is soft.  ?   Tenderness: There is no abdominal tenderness.  ?Musculoskeletal:     ?   General: Normal range of motion.  ?Skin: ?   General: Skin is warm and dry.  ?Neurological:  ?   Mental Status: He is alert and oriented to person, place, and time.  ?   Motor: No abnormal muscle tone.  ?   Comments: No facial asymmetry.  Able to grip with both hands, independently, bilaterally, very light grip strength.  Localizes well  ?  Psychiatric:  ?   Comments: Nonverbal  ? ? ?ED Results / Procedures / Treatments   ?Labs ?(all labs ordered are listed, but only abnormal results are displayed) ?Labs Reviewed  ?HEPARIN LEVEL (UNFRACTIONATED) - Abnormal; Notable for the following components:  ?    Result Value  ? Heparin Unfractionated <0.10 (*)   ? All other components within normal limits  ?CBC - Abnormal; Notable for the following components:  ? Hemoglobin 12.5 (*)   ? HCT 36.6 (*)   ? All other components within normal limits  ?COMPREHENSIVE METABOLIC PANEL - Abnormal; Notable for the following components:  ? Glucose, Bld 109 (*)   ? Creatinine, Ser 1.74 (*)   ? GFR, Estimated 44 (*)   ? All other components within normal limits  ?URINALYSIS, ROUTINE W REFLEX MICROSCOPIC - Abnormal; Notable for the following components:  ? Hgb urine dipstick SMALL (*)   ? All other components within normal limits  ?CBG MONITORING, ED - Abnormal; Notable for the following components:  ? Glucose-Capillary 110 (*)   ? All other components within normal limits  ?I-STAT CHEM 8, ED - Abnormal; Notable for the following components:  ? Creatinine, Ser 1.80 (*)   ? Glucose, Bld 105 (*)   ? Calcium, Ion 1.09 (*)   ? Hemoglobin 12.2 (*)   ? HCT 36.0 (*)   ? All other components within normal limits  ?I-STAT ARTERIAL BLOOD GAS, ED - Abnormal; Notable for the following components:  ? pH, Arterial 7.461 (*)   ?  Bicarbonate 29.2 (*)   ? Acid-Base Excess 5.0 (*)   ? Potassium 3.2 (*)   ? HCT 34.0 (*)   ? Hemoglobin 11.6 (*)   ? All other components within normal limits  ?RESP PANEL BY RT-PCR (FLU A&B, COVID) ARPGX2  ?ET

## 2021-08-16 NOTE — Progress Notes (Signed)
Pharmacy Antibiotic Note ? ?[name] is a 61 y.o. male admitted on 08/16/2021 with possible meningitis.  Pharmacy has been consulted for vancomycin, ampicillin, ceftriaxone dosing. ? ?Scr 1.8, WBC 6.6 ? ?Plan: ?Vancomycin 750 IV every 12 hours.  Goal trough 15-20 mcg/mL. ?Ceftriaxone 2g q12h (CNS infection dosing) ?Vancomycin 2250 mg x1 ?Ampicillin 2g q4h (CNS infection dosing, CrCl > 50) ?Monitor Scr for renal dose adjustments ?F/u vancomycin troughs as appropriate (goal 15-20) ? ?  ? ?Temp (24hrs), Avg:97.8 ?F (36.6 ?C), Min:97.5 ?F (36.4 ?C), Max:98 ?F (36.7 ?C) ? ?Recent Labs  ?Lab 08/16/21 ?1750 08/16/21 ?9532  ?WBC 6.6  --   ?CREATININE 1.74* 1.80*  ?  ?Estimated Creatinine Clearance: 50.7 mL/min (A) (by C-G formula based on SCr of 1.8 mg/dL (H)).   ? ?No Known Allergies ? ?Antimicrobials this admission: ?5/11 ceftriaxone >>  ?5/11 vancomycin >>  ?5/11 ampicillin >> ? ?Dose adjustments this admission: ?N/A ? ?Microbiology results: ?LP planned ? ?Thank you for allowing pharmacy to be a part of this patient?s care. ? ?[name] ?08/16/2021 9:51 PM ? ?

## 2021-08-16 NOTE — Progress Notes (Addendum)
vLTM started  all impedances below 10kohms.  ED pt   [facility] not monitoring  Patient event button tested  MRI compatible leads used for EEG ?

## 2021-08-17 ENCOUNTER — Inpatient Hospital Stay (HOSPITAL_COMMUNITY): Payer: Medicare Other

## 2021-08-17 ENCOUNTER — Encounter (HOSPITAL_COMMUNITY): Payer: Self-pay | Admitting: Internal Medicine

## 2021-08-17 DIAGNOSIS — R9089 Other abnormal findings on diagnostic imaging of central nervous system: Secondary | ICD-10-CM | POA: Diagnosis not present

## 2021-08-17 DIAGNOSIS — G9340 Encephalopathy, unspecified: Secondary | ICD-10-CM | POA: Diagnosis present

## 2021-08-17 DIAGNOSIS — Z79899 Other long term (current) drug therapy: Secondary | ICD-10-CM | POA: Diagnosis not present

## 2021-08-17 DIAGNOSIS — Z832 Family history of diseases of the blood and blood-forming organs and certain disorders involving the immune mechanism: Secondary | ICD-10-CM | POA: Diagnosis not present

## 2021-08-17 DIAGNOSIS — I129 Hypertensive chronic kidney disease with stage 1 through stage 4 chronic kidney disease, or unspecified chronic kidney disease: Secondary | ICD-10-CM | POA: Diagnosis present

## 2021-08-17 DIAGNOSIS — G4730 Sleep apnea, unspecified: Secondary | ICD-10-CM | POA: Diagnosis present

## 2021-08-17 DIAGNOSIS — Z20822 Contact with and (suspected) exposure to covid-19: Secondary | ICD-10-CM | POA: Diagnosis present

## 2021-08-17 DIAGNOSIS — I480 Paroxysmal atrial fibrillation: Secondary | ICD-10-CM | POA: Diagnosis present

## 2021-08-17 DIAGNOSIS — K219 Gastro-esophageal reflux disease without esophagitis: Secondary | ICD-10-CM | POA: Diagnosis present

## 2021-08-17 DIAGNOSIS — G9341 Metabolic encephalopathy: Secondary | ICD-10-CM | POA: Diagnosis present

## 2021-08-17 DIAGNOSIS — N1831 Chronic kidney disease, stage 3a: Secondary | ICD-10-CM | POA: Diagnosis present

## 2021-08-17 DIAGNOSIS — J449 Chronic obstructive pulmonary disease, unspecified: Secondary | ICD-10-CM

## 2021-08-17 DIAGNOSIS — Z981 Arthrodesis status: Secondary | ICD-10-CM | POA: Diagnosis not present

## 2021-08-17 DIAGNOSIS — Z7901 Long term (current) use of anticoagulants: Secondary | ICD-10-CM | POA: Diagnosis not present

## 2021-08-17 DIAGNOSIS — G9689 Other specified disorders of central nervous system: Secondary | ICD-10-CM | POA: Diagnosis not present

## 2021-08-17 DIAGNOSIS — Z8249 Family history of ischemic heart disease and other diseases of the circulatory system: Secondary | ICD-10-CM | POA: Diagnosis not present

## 2021-08-17 DIAGNOSIS — K5900 Constipation, unspecified: Secondary | ICD-10-CM | POA: Diagnosis not present

## 2021-08-17 DIAGNOSIS — D573 Sickle-cell trait: Secondary | ICD-10-CM | POA: Diagnosis present

## 2021-08-17 DIAGNOSIS — G9389 Other specified disorders of brain: Secondary | ICD-10-CM | POA: Diagnosis present

## 2021-08-17 DIAGNOSIS — N179 Acute kidney failure, unspecified: Secondary | ICD-10-CM | POA: Diagnosis present

## 2021-08-17 DIAGNOSIS — N400 Enlarged prostate without lower urinary tract symptoms: Secondary | ICD-10-CM | POA: Diagnosis present

## 2021-08-17 LAB — COMPREHENSIVE METABOLIC PANEL
ALT: 21 U/L (ref 0–44)
AST: 23 U/L (ref 15–41)
Albumin: 3.6 g/dL (ref 3.5–5.0)
Alkaline Phosphatase: 67 U/L (ref 38–126)
Anion gap: 8 (ref 5–15)
BUN: 12 mg/dL (ref 8–23)
CO2: 26 mmol/L (ref 22–32)
Calcium: 8.9 mg/dL (ref 8.9–10.3)
Chloride: 103 mmol/L (ref 98–111)
Creatinine, Ser: 1.36 mg/dL — ABNORMAL HIGH (ref 0.61–1.24)
GFR, Estimated: 59 mL/min — ABNORMAL LOW (ref >60)
Glucose, Bld: 113 mg/dL — ABNORMAL HIGH (ref 70–99)
Potassium: 3.6 mmol/L (ref 3.5–5.1)
Sodium: 137 mmol/L (ref 135–145)
Total Bilirubin: 0.6 mg/dL (ref 0.3–1.2)
Total Protein: 7.1 g/dL (ref 6.5–8.1)

## 2021-08-17 LAB — CBC WITH DIFFERENTIAL/PLATELET
Abs Immature Granulocytes: 0.03 10*3/uL (ref 0.00–0.07)
Basophils Absolute: 0 10*3/uL (ref 0.0–0.1)
Basophils Relative: 1 %
Eosinophils Absolute: 0.3 10*3/uL (ref 0.0–0.5)
Eosinophils Relative: 5 %
HCT: 35.4 % — ABNORMAL LOW (ref 39.0–52.0)
Hemoglobin: 11.9 g/dL — ABNORMAL LOW (ref 13.0–17.0)
Immature Granulocytes: 1 %
Lymphocytes Relative: 28 %
Lymphs Abs: 1.7 10*3/uL (ref 0.7–4.0)
MCH: 28.5 pg (ref 26.0–34.0)
MCHC: 33.6 g/dL (ref 30.0–36.0)
MCV: 84.9 fL (ref 80.0–100.0)
Monocytes Absolute: 0.7 10*3/uL (ref 0.1–1.0)
Monocytes Relative: 12 %
Neutro Abs: 3.3 10*3/uL (ref 1.7–7.7)
Neutrophils Relative %: 53 %
Platelets: 268 10*3/uL (ref 150–400)
RBC: 4.17 MIL/uL — ABNORMAL LOW (ref 4.22–5.81)
RDW: 12.6 % (ref 11.5–15.5)
WBC: 6 10*3/uL (ref 4.0–10.5)
nRBC: 0 % (ref 0.0–0.2)

## 2021-08-17 LAB — HIV ANTIBODY (ROUTINE TESTING W REFLEX): HIV Screen 4th Generation wRfx: NONREACTIVE

## 2021-08-17 LAB — MAGNESIUM: Magnesium: 2.5 mg/dL — ABNORMAL HIGH (ref 1.7–2.4)

## 2021-08-17 LAB — TROPONIN I (HIGH SENSITIVITY): Troponin I (High Sensitivity): 8 ng/L (ref <18)

## 2021-08-17 LAB — CBG MONITORING, ED: Glucose-Capillary: 127 mg/dL — ABNORMAL HIGH (ref 70–99)

## 2021-08-17 MED ORDER — LIDOCAINE HCL (PF) 1 % IJ SOLN
5.0000 mL | Freq: Once | INTRAMUSCULAR | Status: AC
  Administered 2021-08-17: 5 mL via INTRADERMAL

## 2021-08-17 MED ORDER — ACETAMINOPHEN 650 MG RE SUPP: 650.0000 mg | RECTAL | Status: DC | PRN

## 2021-08-17 MED ORDER — POLYETHYLENE GLYCOL 3350 17 G PO PACK
17.0000 g | PACK | Freq: Every day | ORAL | Status: DC | PRN
  Administered 2021-08-17: 17 g via ORAL
  Filled 2021-08-17: qty 1

## 2021-08-17 MED ORDER — ORAL CARE MOUTH RINSE
15.0000 mL | Freq: Two times a day (BID) | OROMUCOSAL | Status: DC
  Administered 2021-08-17 – 2021-08-19 (×5): 15 mL via OROMUCOSAL

## 2021-08-17 MED ORDER — SOTALOL HCL 80 MG PO TABS
80.0000 mg | ORAL_TABLET | Freq: Two times a day (BID) | ORAL | Status: DC
  Administered 2021-08-17 – 2021-08-19 (×4): 80 mg via ORAL
  Filled 2021-08-17 (×6): qty 1

## 2021-08-17 MED ORDER — ISOSORBIDE MONONITRATE ER 30 MG PO TB24
30.0000 mg | ORAL_TABLET | Freq: Every day | ORAL | Status: DC
  Administered 2021-08-18 – 2021-08-19 (×2): 30 mg via ORAL
  Filled 2021-08-17 (×2): qty 1

## 2021-08-17 MED ORDER — LACTATED RINGERS IV SOLN: INTRAVENOUS | Status: AC

## 2021-08-17 MED ORDER — LABETALOL HCL 5 MG/ML IV SOLN: 10.0000 mg | INTRAVENOUS | Status: DC | PRN

## 2021-08-17 MED ORDER — NITROGLYCERIN 0.4 MG/HR TD PT24
0.4000 mg | MEDICATED_PATCH | Freq: Every day | TRANSDERMAL | Status: DC
  Administered 2021-08-17: 0.4 mg via TRANSDERMAL
  Filled 2021-08-17: qty 1

## 2021-08-17 MED ORDER — AMLODIPINE BESYLATE 5 MG PO TABS: 5.0000 mg | ORAL_TABLET | Freq: Every day | ORAL | Status: DC

## 2021-08-17 MED ORDER — ONDANSETRON HCL 4 MG/2ML IJ SOLN: 4.0000 mg | Freq: Four times a day (QID) | INTRAMUSCULAR | Status: DC | PRN

## 2021-08-17 MED ORDER — LIP MEDEX EX OINT
1.0000 "application " | TOPICAL_OINTMENT | CUTANEOUS | Status: DC | PRN
  Administered 2021-08-17: 1 via TOPICAL
  Filled 2021-08-17: qty 7

## 2021-08-17 MED ORDER — ISOSORBIDE MONONITRATE ER 30 MG PO TB24: 30.0000 mg | ORAL_TABLET | Freq: Every day | ORAL | Status: DC

## 2021-08-17 NOTE — Procedures (Addendum)
Patient Name: [name]  ?MRN: [id number]  ?Epilepsy Attending: Lora Havens  ?Referring Physician/Provider: Clarice Pole, NP ?Duration: 08/16/2021 2008 to 2341, 08/17/2021 0730 to  1240 ?  ?Patient history: 61 y.o. male PMHx that includes PAF on Eliquis and held prior to surgery and was told to restart on 08/17/21 who presents with fixed gaze, severe aphasia (mute) weakness in all 4 extremities. He is able to follow commands. EEG to evaluate for seizure ?  ?Level of alertness: Awake, asleep ?  ?AEDs during EEG study: None ?  ?Technical aspects: This EEG study was done with scalp electrodes positioned according to the 10-20 International system of electrode placement. Electrical activity was acquired at a sampling rate of '500Hz'$  and reviewed with a high frequency filter of '70Hz'$  and a low frequency filter of '1Hz'$ . EEG data were recorded continuously and digitally stored.  ?  ?Description: The posterior dominant rhythm consists of 9 Hz activity of moderate voltage (25-35 uV) seen predominantly in posterior head regions, symmetric and reactive to eye opening and eye closing. Sleep was characterized by vertex waves, sleep spindles (12 to 14 Hz), maximal frontocentral region. Hyperventilation and photic stimulation were not performed.  ? ?Of note, study was disconnected between 08/16/2021 2341 to   08/17/2021 0730 for MRI ?  ?IMPRESSION: ?This study is within normal limits. No seizures or epileptiform discharges were seen throughout the recording. ?  ?Lora Havens  ?

## 2021-08-17 NOTE — Progress Notes (Signed)
LTM maint complete - no skin breakdown under: FP2,F7.PT went to MRI was never connected back to EEG. [facility] now monitoring  ? ?

## 2021-08-17 NOTE — ED Notes (Signed)
Patient transported upstairs on bedside monitor with NT and this RN ?

## 2021-08-17 NOTE — Progress Notes (Signed)
Neurology Progress Note ?[name] ?MR# [id number] ?08/17/2021 ? ? ?S: No overnight events; no new complaints. Patient and family asked to see imaging of 1 mm ring like lesion ? ? ?O: ?Current vital signs: ?BP 111/77 (BP Location: Right Arm)   Pulse 67   Temp 98.2 ?F (36.8 ?C) (Oral)   Resp 14   Ht 6' (1.829 m)   Wt 94.4 kg   SpO2 100%   BMI 28.22 kg/m?  ?Vital signs in last 24 hours: ?Temp:  [97.5 ?F (36.4 ?C)-98.2 ?F (36.8 ?C)] 98.2 ?F (36.8 ?C) (05/12 0736) ?Pulse Rate:  [62-73] 67 (05/12 0312) ?Resp:  [10-20] 14 (05/12 0736) ?BP: (104-166)/(66-97) 111/77 (05/12 0736) ?SpO2:  [98 %-100 %] 100 % (05/12 0736) ?Weight:  [94.4 kg] 94.4 kg (05/12 0300) ?GENERAL: Awake, alert in NAD ?HEENT: Normocephalic and atraumatic, moist mm, no LN++, no thyromegaly. Has neck brace from post op ?LUNGS: symmetric excursions bilaterally with no audible wheezes. ?CV: RR, equal pulses bilaterally. ?ABDOMEN: Soft, nontender, distended with normoactive BS ?Ext: warm, well perfused, intact peripheral pulses  ?NEURO:  ?Mental Status: AA&Ox3  ?Language: speech is fluent.  Intact naming, repetition, and comprehension. ?PERR. EOMI, visual fields full, no facial asymmetry, facial sensation intact, hearing intact. ?No evidence of tongue atrophy or fibrillations, tongue/uvula/soft palate midline elevates symmetrically  ?Normal sternocleidomastoid and trapezius muscle strength. Motor: 5/5 strength in BLE and 3/5 BUE ?Tone: Tone and bulk is normal ?Sensation: Intact to light touch bilaterally ?Coordination: FTN unable to perform due to weakness and pain in BUE, no ataxia in BLE. ?Gait - Deferred ? ?Labs ? ?   ?Component Value Date/Time  ? WBC 6.0 08/17/2021 0329  ? RBC 4.17 (L) 08/17/2021 0329  ? HGB 11.9 (L) 08/17/2021 0329  ? HCT 35.4 (L) 08/17/2021 0329  ? HCT 29.8 (L) 04/26/2019 0926  ? PLT 268 08/17/2021 0329  ? MCV 84.9 08/17/2021 0329  ? MCH 28.5 08/17/2021 0329  ? MCHC 33.6 08/17/2021 0329  ? RDW 12.6 08/17/2021 0329  ?  LYMPHSABS 1.7 08/17/2021 0329  ? MONOABS 0.7 08/17/2021 0329  ? EOSABS 0.3 08/17/2021 0329  ? BASOSABS 0.0 08/17/2021 0329  ? ? ?   ?Component Value Date/Time  ? NA 137 08/17/2021 0329  ? K 3.6 08/17/2021 0329  ? CL 103 08/17/2021 0329  ? CO2 26 08/17/2021 0329  ? GLUCOSE 113 (H) 08/17/2021 0329  ? BUN 12 08/17/2021 0329  ? CREATININE 1.36 (H) 08/17/2021 0329  ? CALCIUM 8.9 08/17/2021 0329  ? PROT 7.1 08/17/2021 0329  ? ALBUMIN 3.6 08/17/2021 0329  ? AST 23 08/17/2021 0329  ? ALT 21 08/17/2021 0329  ? ALKPHOS 67 08/17/2021 0329  ? BILITOT 0.6 08/17/2021 0329  ? GFRNONAA 59 (L) 08/17/2021 0329  ? GFRAA >60 08/23/2019 2229  ? ? ?   ?Component Value Date/Time  ? CHOL 158 02/15/2020 0559  ? TRIG 95 02/15/2020 0559  ? HDL 28 (L) 02/15/2020 0559  ? CHOLHDL 5.6 02/15/2020 0559  ? VLDL 19 02/15/2020 0559  ? [place] 111 (H) 02/15/2020 0559  ? ? ?Medications: ?Scheduled Meds: ? mouth rinse  15 mL Mouth Rinse BID  ? nitroGLYCERIN  0.4 mg Transdermal Daily  ? ?Continuous Infusions: ? ampicillin (OMNIPEN) IV 2 g (08/17/21 1104)  ? cefTRIAXone (ROCEPHIN)  IV Stopped (08/16/21 2256)  ? lactated ringers 75 mL/hr at 08/17/21 0339  ? vancomycin 750 mg (08/17/21 1040)  ? ?PRN Meds:.acetaminophen, labetalol, lip balm, ondansetron (ZOFRAN) IV ? ?Imaging ?I have reviewed  images in epic and the results pertinent to this consultation are: ?CT Head showed NAICP ?CTA head and neck W/WO showed no LVO ?MRI Brain showed 1 cm ring-like lesion with adjacent septal leptomeningeal signal involving the left occipital lobe.  Nonspecific specific but could reflect infection/meningitis and LP was suggested. ?Overnight EEG- This study is within normal limits. No seizures or epileptiform discharges were seen throughout the recording.  ? ? ?Assessment: [name] is a 61 y.o. male recent hx of progressive neck pain with bilateral upper extremity sensory loss, loss of coordination and fine motor control, and some ?weakness s/p C3/4, C4/5, C5/6 ACDF  on 08/10/21. Discharged home and doing well. Yesterday while outside, he told his sister he was tired and wanted to lay down around 1600. Patient was at post op baseline, alert, oriented able to walk with walker at that time. At around 24 daughter checked on him and he was lethargic and soon he became unresponsive. EMS called and patient brought to [facility] ED as a code stroke.  He was awake and alert although mute at presentation. ? ?08/16/21-On arrival to ED patient had fixed gaze and was unable to look in any direction. (-) oculocephalics. He was able to shut eyes and stick out tongue on command. Able to slightly wiggle fingers and toes to command but no movement anti-gravity in any extremity. TNK was not administered 2/2 contraindication of ACDF within past 6 days. CT head NAICP. CTA showed no LVO. CNS imaging personally reviewed by Dr Quinn Axe.  ? ?Vital signs stable and airway protected. Stroke remained on the differential although his CTA showed no LVO and he has a vertical gaze palsy as well as horizontal which speaks against pontine infarct. Narcan was given with no improvement. Concern for seizure. He had no improvement to '2mg'$  IV ativan and 4.5g keppra. Per Dr. Quinn Axe, she was not more aggressive in empiric tx possible nonconvulsive seizure because vital signs currently stable. ABG unremarkable. STAT EEG ordered to be f/b cEEG with MR compatible leads. As there was no clear metabolic or infectious source identified in ED he required an LP. As providers were unable to obtain LP in ED prior to admission, LP with radiology ordered for am. Neurology will f/u after STAT EEG read with further guidance.  ? ?08/17/21- Patient examined while laying in bed with family at bedside. He is alert and oriented and able to speak and follow all my commands. Significantly improved from my assessment last night. Per RN notes he was able to write to communicate around 1:30 AM and begin to vocalize some sentences with encouragement around  6 AM this morning. Discussed no seizures on EEG and will discontinue.  Patient and family at bedside asked to see MRI brain findings and I was able to show 1 mm lesion on imaging.  Family and patient aware that LP to follow-up for metabolic or infectious source of symptoms.  Discussed case with Dr. Quinn Axe and will follow-up for LP results. ?  ?  ?Recommendations: ?LP for metabolic versus infectious etiology of symptoms ?Discontinued overnight EEG ? ?Neurology will follow for LP results. Please call with any changes in neurological status, questions or concerns.  ? ?Electronically signed by:  ?Parke Poisson, Neurology NP ?Can be reached on Epic Secure Messenger ?08/17/2021, 11:32 AM ? ?If 7pm- 7am, please page neurology on call as listed in [place].  ?

## 2021-08-17 NOTE — Subjective & Objective (Signed)
CC: altered mental status ?HPI: ?61 year old African-American male history of hypertension, paroxysmal atrial fibrillation, CKD stage III, history of cervical spine radiculopathy status post cervical fusion 3 through 6 on Aug 10, 2021 by Dr. Trenton Gammon presents to the ER today with altered mental status.  Patient was last known normal around 12 PM today.  His sister Langley Gauss is here from out of town helping care for him after surgery.  She states that she went out today in the afternoon.  Patient told her that he was feeling somewhat tired.  This was around 12 PM.  When she returned around 4:00, his daughter told her that he was altered.  Patient was not responsive to verbal stimuli.  EMS was called. ? ?Sister states patient has not had a bowel movement in about a week.  He has been taking hydrocodone every day for pain. ? ?Code stroke was initiated. ? ?Initial head CT and CTA of the head and neck were negative. ? ?Patient seen by neurology.  EEG was started.  Negative thus far. ? ?Neurology noted the patient with fixed gaze.  He was aphasic.  He is able to follow some verbal commands but is gross motor strength is very weak. ? ?Bedside LP performed by EDP but this was unsuccessful. ? ?Neurology recommended starting antibiotics to treat for meningitis until patient can undergo lumbar puncture by interventional radiology. ? ?[facility] hospitalist asked to admit the patient. ? ? ?

## 2021-08-17 NOTE — Progress Notes (Signed)
Patient now able to vocalize some sentences with encouragement. ?

## 2021-08-17 NOTE — Assessment & Plan Note (Addendum)
Patient is on amlodipine, Imdur, hydrochlorothiazide and triamterene as an outpatient. Started on nitroglycerin patch on admission. Patch discontinued and home Imdur restarted. Home amlodipine restarted as well. Continue home regimen on discharge, but have recommended to restart hydrochlorothiazide-triamterene in a few days. ?

## 2021-08-17 NOTE — ED Notes (Signed)
Patient disconnect from CEEG per EEG tech for MRI scan. ?

## 2021-08-17 NOTE — Progress Notes (Signed)
Discontinued cEEG study.  Removed EEG electrodes.  Notified [facility] monitoring.  No skin breakdown observed. ?

## 2021-08-17 NOTE — Progress Notes (Signed)
?  Transition of Care (TOC) Screening Note ? ? ?Patient Details  ?Name: [name] ?Date of Birth: 1960/10/04 ? ? ?Transition of Care (TOC) CM/SW Contact:    ?Pollie Friar, RN ?Phone Number: ?08/17/2021, 2:59 PM ? ? ? ?Transition of Care Department [facility]) has reviewed patient and no TOC needs have been identified at this time. We will continue to monitor patient advancement through interdisciplinary progression rounds. If new patient transition needs arise, please place a TOC consult. ?  ?

## 2021-08-17 NOTE — Assessment & Plan Note (Addendum)
Stable

## 2021-08-17 NOTE — Progress Notes (Signed)
? ?PROGRESS NOTE ? ? ? ?[name]  SAY:[id number] DOB: 06-04-1960 DOA: 08/16/2021 ?PCP: [name], MD ? ? ?Brief Narrative: ?[name] is a 61 y.o. male with a history of hypertension, paroxysmal atrial fibrillation, CKD stage III, history of cervical spine radiculopathy status post cervical fusion 3 through 59 on Aug 10, 2021 who presented secondary to altered mental status. Initial concern for stroke. Neurology consulted. Code stroke called with imaging not suggestive of acute stroke, but MRI significant for a 1 cm ring-like lesion suggestive of possible infectious etiology. LP ordered and pending. Mental status significantly improved day after admission. ? ? ?Assessment and Plan: ?* Acute encephalopathy ?Uncertain etiology but appears to have resolved. Workup for infectious etiology ongoing. ?-Follow-up LP/labs ?-Continue empiric ampicillin, Ceftriaxone and Vancomycin ? ?S/P cervical spinal fusion - C3-6 ?Noted. Currently in cervical collar. ? ?COPD (chronic obstructive pulmonary disease) ([place]) ?Stable. On RA. ? ?PAF (paroxysmal atrial fibrillation) ([place]) ?Currently in normal sinus rhythm. On Eliquis as an outpatient which is held currently. Patient is also managed on sotalol ?-Will consider restarting Eliquis pending completion of LP ?-Resume home sotalol ? ?Stage 3a chronic kidney disease (CKD) ([place]) ?Stable. ? ?HTN (hypertension) ?Patient is on amlodipine, Imdur, hydrochlorothiazide and triamterene as an outpatient. Started on nitroglycerin patch on admission.  ?-Restart home Imdur ? ? ? ?DVT prophylaxis: SCDs, restart Eliquis after LP ?Code Status:   Code Status: Full Code ?Family Communication: Girlfriend and son at bedside ?Disposition Plan: Discharge home likely  ? ? ?Consultants:  ?Neurology ? ?Procedures:  ?None ? ?Antimicrobials: ?Ceftriaxone ?Ampicillin ?Vancomycin  ? ? ?Subjective: ?Patient reports no issues this morning. ? ?Objective: ?BP 111/77 (BP Location: Right Arm)   Pulse 67    Temp 98.2 ?F (36.8 ?C) (Oral)   Resp 14   Ht 6' (1.829 m)   Wt 94.4 kg   SpO2 100%   BMI 28.22 kg/m?  ? ?Examination: ? ?General exam: Appears calm and comfortable. In neck collar. ?Respiratory system: Clear to auscultation. Respiratory effort normal. ?Cardiovascular system: S1 & S2 heard, RRR. No murmurs, rubs, gallops or clicks. ?Gastrointestinal system: Abdomen is nondistended, soft and nontender. Normal bowel sounds heard. ?Central nervous system: Alert and oriented. No focal neurological deficits. ?Musculoskeletal: No edema. No calf tenderness ?Skin: No cyanosis. No rashes ?Psychiatry: Judgement and insight appear normal. Mood & affect appropriate.  ? ? ?Data Reviewed: I have personally reviewed following labs and imaging studies ? ?CBC ?Lab Results  ?Component Value Date  ? WBC 6.0 08/17/2021  ? RBC 4.17 (L) 08/17/2021  ? HGB 11.9 (L) 08/17/2021  ? HCT 35.4 (L) 08/17/2021  ? MCV 84.9 08/17/2021  ? MCH 28.5 08/17/2021  ? PLT 268 08/17/2021  ? MCHC 33.6 08/17/2021  ? RDW 12.6 08/17/2021  ? LYMPHSABS 1.7 08/17/2021  ? MONOABS 0.7 08/17/2021  ? EOSABS 0.3 08/17/2021  ? BASOSABS 0.0 08/17/2021  ? ? ? ?Last metabolic panel ?Lab Results  ?Component Value Date  ? NA 137 08/17/2021  ? K 3.6 08/17/2021  ? CL 103 08/17/2021  ? CO2 26 08/17/2021  ? BUN 12 08/17/2021  ? CREATININE 1.36 (H) 08/17/2021  ? GLUCOSE 113 (H) 08/17/2021  ? GFRNONAA 59 (L) 08/17/2021  ? GFRAA >60 08/23/2019  ? CALCIUM 8.9 08/17/2021  ? PROT 7.1 08/17/2021  ? ALBUMIN 3.6 08/17/2021  ? BILITOT 0.6 08/17/2021  ? ALKPHOS 67 08/17/2021  ? AST 23 08/17/2021  ? ALT 21 08/17/2021  ? ANIONGAP 8 08/17/2021  ? ? ?GFR: ?Estimated  Creatinine Clearance: 67.2 mL/min (A) (by C-G formula based on SCr of 1.36 mg/dL (H)). ? ?Recent Results (from the past 240 hour(s))  ?Resp Panel by RT-PCR (Flu A&B, Covid) Nasopharyngeal Swab     Status: None  ? Collection Time: 08/16/21  5:48 PM  ? Specimen: Nasopharyngeal Swab; Nasopharyngeal(NP) swabs in vial transport  medium  ?Result Value Ref Range Status  ? SARS Coronavirus 2 by RT PCR NEGATIVE NEGATIVE Final  ?  Comment: (NOTE) ?SARS-CoV-2 target nucleic acids are NOT DETECTED. ? ?The SARS-CoV-2 RNA is generally detectable in upper respiratory ?specimens during the acute phase of infection. The lowest ?concentration of SARS-CoV-2 viral copies this assay can detect is ?138 copies/mL. A negative result does not preclude SARS-Cov-2 ?infection and should not be used as the sole basis for treatment or ?other patient management decisions. A negative result may occur with  ?improper specimen collection/handling, submission of specimen other ?than nasopharyngeal swab, presence of viral mutation(s) within the ?areas targeted by this assay, and inadequate number of viral ?copies(<138 copies/mL). A negative result must be combined with ?clinical observations, patient history, and epidemiological ?information. The expected result is Negative. ? ?Fact Sheet for Patients:  ?EntrepreneurPulse.com.au ? ?Fact Sheet for Healthcare Providers:  ?IncredibleEmployment.be ? ?This test is no t yet approved or cleared by the [place] FDA and  ?has been authorized for detection and/or diagnosis of SARS-CoV-2 by ?FDA under an Emergency Use Authorization (EUA). This EUA will remain  ?in effect (meaning this test can be used) for the duration of the ?COVID-19 declaration under Section 564(b)(1) of the Act, 21 ?U.S.C.section 360bbb-3(b)(1), unless the authorization is terminated  ?or revoked sooner.  ? ? ?  ? Influenza A by PCR NEGATIVE NEGATIVE Final  ? Influenza B by PCR NEGATIVE NEGATIVE Final  ?  Comment: (NOTE) ?The Xpert Xpress SARS-CoV-2/FLU/RSV plus assay is intended as an aid ?in the diagnosis of influenza from Nasopharyngeal swab specimens and ?should not be used as a sole basis for treatment. Nasal washings and ?aspirates are unacceptable for Xpert Xpress SARS-CoV-2/FLU/RSV ?testing. ? ?Fact Sheet for  Patients: ?EntrepreneurPulse.com.au ? ?Fact Sheet for Healthcare Providers: ?IncredibleEmployment.be ? ?This test is not yet approved or cleared by the [place] FDA and ?has been authorized for detection and/or diagnosis of SARS-CoV-2 by ?FDA under an Emergency Use Authorization (EUA). This EUA will remain ?in effect (meaning this test can be used) for the duration of the ?COVID-19 declaration under Section 564(b)(1) of the Act, 21 U.S.C. ?section 360bbb-3(b)(1), unless the authorization is terminated or ?revoked. ? ?Performed at [facility], [place] [address] [place], [place] ?[address] ?  ?  ? ? ?Radiology Studies: ?MR BRAIN WO CONTRAST ? ?Result Date: 08/17/2021 ?CLINICAL DATA:  Initial evaluation for CNS infection/meningitis suspected. EXAM: MRI HEAD WITHOUT CONTRAST TECHNIQUE: Multiplanar, multiecho pulse sequences of the brain and surrounding structures were obtained without intravenous contrast. COMPARISON:  Prior CTs from 08/12/2021. FINDINGS: Brain: Cerebral volume within normal limits. Mild scattered patchy T2/signal abnormality seen involving the periventricular deep white matter both cerebral hemispheres, nonspecific, but most typical of chronic microvascular ischemic disease. Overall, appearance is mild in nature. No diffusion abnormality to suggest acute or subacute infarct or changes related to seizure. No areas of chronic cortical infarction. There is a small ring-like lesion measuring approximately 1 cm with FLAIR hyperintense rim overlying the left occipital lobe (series 11, image 15). Unclear whether this is intra-axial or extra-axial, however, no significant surrounding parenchymal edema is evident, suggesting that this may  be extra-axial in nature. Mild adjacent leptomeningeal FLAIR signal intensity present as well (series 11, image 16). Associated susceptibility artifact (series 14, image 29. No visible hemorrhage seen at this location on prior head  CT. No associated restricted diffusion to suggest abscess or empyema formation. While this finding is nonspecific, changes of localized meningitis could potentially have this appearance. Otherwise, no other convinc

## 2021-08-17 NOTE — Assessment & Plan Note (Addendum)
Currently in normal sinus rhythm. On Eliquis as an outpatient which is held currently. Patient is also managed on sotalol. Resume home regimen on discharge. ?

## 2021-08-17 NOTE — Progress Notes (Signed)
Patient unable to vocalize needs. Able to write and answer questions nonverbally through writing, gestures, and hand squeezes (1 = yes, 2 = no). Patient writing "it's burning" when he kept pointing to his groin. He then wrote "the nurses wiped me" (after he had received a CHG bath). Patient's skin further wiped and burning stopped. Patient's significant other and son at bedside communicating with son through writing and gestures.  ? ?When RN attempted to have patient vocalize patient opened mouth and could not speak. He then wrote "words can't come."  ? ?Family highly attentive at bedside, quick to use writing to communicate. Additionally significant other asked RN if we RN and her could discuss patient's medical care outside of the room outside of his presence "to not make him mad." RN declined stating it was inappropriate as patient was oriented and able to make clear decisions.  ?

## 2021-08-17 NOTE — ED Notes (Signed)
Called MRI to update them that Ampicillin medication is complete and that patient is ready for MRI. MRI aware. MRI to get patient when they are available, this RN told about 17mns-1hour expected wait.  ?

## 2021-08-17 NOTE — Assessment & Plan Note (Addendum)
Resolved. See problem, Abnormal brain MRI. ?

## 2021-08-17 NOTE — Assessment & Plan Note (Addendum)
Noted. Currently in cervical collar. ?

## 2021-08-17 NOTE — Assessment & Plan Note (Signed)
Stable. On RA. ?

## 2021-08-17 NOTE — H&P (Addendum)
?History and Physical  ? ? ?QUENTIN SHOREY WEX:[id number] DOB: Jan 21, 1961 DOA: 08/16/2021 ? ?DOS: the patient was seen and examined on 08/16/2021 ? ?PCP: [name], MD  ? ?Patient coming from: Home ? ?I have personally briefly reviewed patient's old medical records in [facility] ? ?CC: altered mental status ?HPI: ?61 year old African-American male history of hypertension, paroxysmal atrial fibrillation, CKD stage III, history of cervical spine radiculopathy status post cervical fusion 3 through 6 on Aug 10, 2021 by Dr. Trenton Gammon presents to the ER today with altered mental status.  Patient was last known normal around 12 PM today.  His sister Langley Gauss is here from out of town helping care for him after surgery.  She states that she went out today in the afternoon.  Patient told her that he was feeling somewhat tired.  This was around 12 PM.  When she returned around 4:00, his daughter told her that he was altered.  Patient was not responsive to verbal stimuli.  EMS was called. ? ?Sister states patient has not had a bowel movement in about a week.  He has been taking hydrocodone every day for pain. ? ?Code stroke was initiated. ? ?Initial head CT and CTA of the head and neck were negative. ? ?Patient seen by neurology.  EEG was started.  Negative thus far. ? ?Neurology noted the patient with fixed gaze.  He was aphasic.  He is able to follow some verbal commands but is gross motor strength is very weak. ? ?Bedside LP performed by EDP but this was unsuccessful. ? ?Neurology recommended starting antibiotics to treat for meningitis until patient can undergo lumbar puncture by interventional radiology. ? ?[facility] hospitalist asked to admit the patient. ? ?  ? ?ED Course: CT head, CTA head/neck negative. Workup negative thus far. Bedside LP unsuccessful. ? ?Review of Systems:  ?Review of Systems  ?Unable to perform ROS: Mental status change  ? ?Past Medical History:  ?Diagnosis Date  ? A-fib ([place])   ? Chest pain   ?  a. 11/2002 Cath: EF 68%, LM nl, LAD small, nl, LCX nl, RCA tortuous/nl;  b. 08/2011 St Echo: EF 60%, normal contractility, no scar/ischemia.  ? COPD (chronic obstructive pulmonary disease) ([place])   ? Dysrhythmia   ? Enlarged prostate   ? Gastrointestinal hemorrhage 08/25/2018  ? GERD (gastroesophageal reflux disease)   ? GI bleeding 08/2018  ? Hypertension   ? Sickle cell trait ([place])   ? Sleep apnea   ? SVT (supraventricular tachycardia) ([place])   ? ? ?Past Surgical History:  ?Procedure Laterality Date  ? ANTERIOR CERVICAL DECOMP/DISCECTOMY FUSION N/A 08/10/2021  ? Procedure: CERVICAL THREE-FOUR, CERVICAL FOUR-FIVE, CERVICAL FIVE-SIX ANTERIOR CERVICAL DECOMPRESSION/DISCECTOMY FUSION;  Surgeon: Earnie Larsson, MD;  Location: [facility];  Service: Neurosurgery;  Laterality: N/A;  3C/RM 20  ? ATRIAL FIBRILLATION ABLATION    ? BIOPSY  08/27/2018  ? Procedure: BIOPSY;  Surgeon: Thornton Park, MD;  Location: [facility];  Service: Gastroenterology;;  ? BIOPSY  08/28/2018  ? Procedure: BIOPSY;  Surgeon: Thornton Park, MD;  Location: [facility];  Service: Gastroenterology;;  ? COLONOSCOPY WITH PROPOFOL N/A 08/28/2018  ? Procedure: COLONOSCOPY WITH PROPOFOL;  Surgeon: Thornton Park, MD;  Location:  [facility];  Service: Gastroenterology;  Laterality: N/A;  ? ENTEROSCOPY N/A 08/27/2018  ? Procedure: ENTEROSCOPY;  Surgeon: Thornton Park, MD;  Location: [facility];  Service: Gastroenterology;  Laterality: N/A;  ? GIVENS CAPSULE STUDY N/A 08/28/2018  ? Procedure: GIVENS CAPSULE STUDY;  Surgeon: Thornton Park,  MD;  Location: [facility];  Service: Gastroenterology;  Laterality: N/A;  ? LEFT HEART CATHETERIZATION WITH CORONARY ANGIOGRAM N/A 03/11/2013  ? Procedure: LEFT HEART CATHETERIZATION WITH CORONARY ANGIOGRAM;  Surgeon: Jolaine Artist, MD;  Location: [facility] LAB;  Service: Cardiovascular;  Laterality: N/A;  ? POLYPECTOMY  08/28/2018  ? Procedure: POLYPECTOMY;  Surgeon: Thornton Park, MD;  Location: [facility];   Service: Gastroenterology;;  ? TONSILLECTOMY    ? TRANSURETHRAL RESECTION OF PROSTATE    ? ? ? reports that he has never smoked. He has never used smokeless tobacco. He reports that he does not drink alcohol and does not use drugs. ? ?No Known Allergies ? ?Family History  ?Problem Relation Age of Onset  ? CAD Mother   ?     died in her 4's - MI  ? Multiple myeloma Brother   ? Sickle cell anemia Father   ?     died @ 18  ? Gastric cancer Neg Hx   ? ? ?Prior to Admission medications   ?Medication Sig Start Date End Date Taking? Authorizing Provider  ?acetaminophen (TYLENOL) 325 MG tablet Take 650 mg by mouth every 6 (six) hours as needed for moderate pain.   Yes [provider]  ?amLODipine (NORVASC) 5 MG tablet Take 1 tablet (5 mg total) by mouth daily. 04/30/19 08/16/21 Yes Kayleen Memos, DO  ?cetirizine (ZYRTEC) 10 MG tablet Take 10 mg by mouth daily as needed for allergies.    Yes [provider]  ?Colchicine 0.6 MG CAPS Take 0.6 mg by mouth 2 (two) times daily as needed (gout flare). 01/17/20  Yes [provider]  ?cyclobenzaprine (FLEXERIL) 10 MG tablet Take 1 tablet (10 mg total) by mouth 3 (three) times daily as needed for muscle spasms. 08/11/21  Yes Marvis Moeller, NP  ?diclofenac (VOLTAREN) 75 MG EC tablet Take 75 mg by mouth daily. 07/18/21  Yes [provider]  ?EMGALITY 120 MG/ML SOAJ Inject 120 mg into the skin every 30 (thirty) days. 07/08/21  Yes [provider]  ?famotidine (PEPCID) 20 MG tablet Take 40 mg by mouth daily as needed for heartburn or indigestion.   Yes [provider]  ?ferrous sulfate 325 (65 FE) MG tablet Take 1 tablet (325 mg total) by mouth daily with breakfast. 04/30/19 08/16/21 Yes Kayleen Memos, DO  ?fluticasone (FLONASE) 50 MCG/ACT nasal spray Place 1 spray into both nostrils daily as needed for allergies or rhinitis.   Yes [provider]  ?Fluticasone-Salmeterol (ADVAIR) 250-50 MCG/DOSE AEPB Inhale 1 puff into the  lungs 2 (two) times daily as needed (wheezing).   Yes [provider]  ?gabapentin (NEURONTIN) 300 MG capsule Take 1 capsule (300 mg total) by mouth 4 (four) times daily. For nerve pain ?Patient taking differently: Take 300 mg by mouth 4 (four) times daily. 07/09/21  Yes Lovorn, Jinny Blossom, MD  ?guaiFENesin (MUCINEX) 600 MG 12 hr tablet Take 600 mg by mouth daily as needed for to loosen phlegm. 05/18/21  Yes [provider]  ?HYDROcodone-acetaminophen (NORCO/VICODIN) 5-325 MG tablet Take 1-2 tablets by mouth every 4 (four) hours as needed for moderate pain. 08/11/21 08/11/22 Yes Marvis Moeller, NP  ?isosorbide mononitrate (IMDUR) 30 MG 24 hr tablet Take 30 mg by mouth daily. 02/16/21  Yes [provider]  ?linaclotide (LINZESS) 145 MCG CAPS capsule Take 145 mcg by mouth daily as needed (Constipation). 10/07/19  Yes [provider]  ?losartan (COZAAR) 25 MG tablet Take 25 mg by  mouth daily. 07/18/21  Yes [provider]  ?nitroGLYCERIN (NITROSTAT) 0.4 MG SL tablet Place 1 tablet (0.4 mg total) under the tongue every 5 (five) minutes as needed for chest pain. 03/12/13  Yes Thurnell Lose, MD  ?NON FORMULARY CPAP at bedtime   Yes [provider]  ?ondansetron (ZOFRAN) 8 MG tablet Take 8 mg by mouth every 8 (eight) hours as needed for nausea or vomiting.  10/20/18  Yes [provider]  ?pantoprazole (PROTONIX) 40 MG tablet Take 40 mg by mouth daily. 07/28/14  Yes [provider]  ?polyethylene glycol (MIRALAX / GLYCOLAX) 17 g packet Take 17 g by mouth daily. ?Patient taking differently: Take 17 g by mouth daily as needed for mild constipation. 04/30/19  Yes Kayleen Memos, DO  ?sildenafil (REVATIO) 20 MG tablet Take 60-100 mg by mouth daily as needed (ED). 11/25/18  Yes [provider]  ?sotalol (BETAPACE) 80 MG tablet Take 1 tablet (80 mg total) by mouth every 12 (twelve) hours. 04/29/19 08/16/21 Yes Kayleen Memos, DO  ?SUMAtriptan (IMITREX) 100 MG  tablet Take 100 mg by mouth as directed. Take one tablet at onset, then an additional tablet every 12 hours as needed for migraine 02/04/20  Yes [provider]  ?triamterene-hydrochlorothiazide (D

## 2021-08-17 NOTE — Hospital Course (Addendum)
[name] is a 61 y.o. male with a history of hypertension, paroxysmal atrial fibrillation, CKD stage III, history of cervical spine radiculopathy status post cervical fusion 3 through 6 on Aug 10, 2021 who presented secondary to altered mental status. Initial concern for stroke. Neurology consulted. Code stroke called with imaging not suggestive of acute stroke, but MRI significant for a 1 cm ring-like lesion suggestive of possible infectious etiology. LP ordered but unable to complete. Mental status significantly improved day after admission. Patient is on empiric antibiotics. ?

## 2021-08-18 ENCOUNTER — Inpatient Hospital Stay (HOSPITAL_COMMUNITY): Payer: Medicare Other

## 2021-08-18 DIAGNOSIS — R9089 Other abnormal findings on diagnostic imaging of central nervous system: Secondary | ICD-10-CM

## 2021-08-18 DIAGNOSIS — N179 Acute kidney failure, unspecified: Secondary | ICD-10-CM

## 2021-08-18 DIAGNOSIS — G9689 Other specified disorders of central nervous system: Secondary | ICD-10-CM

## 2021-08-18 DIAGNOSIS — K5900 Constipation, unspecified: Secondary | ICD-10-CM

## 2021-08-18 DIAGNOSIS — B999 Unspecified infectious disease: Secondary | ICD-10-CM

## 2021-08-18 MED ORDER — VANCOMYCIN HCL 1250 MG/250ML IV SOLN: 1250.0000 mg | Freq: Two times a day (BID) | INTRAVENOUS | Status: DC

## 2021-08-18 MED ORDER — SORBITOL 70 % SOLN
960.0000 mL | TOPICAL_OIL | Freq: Once | ORAL | Status: AC
  Administered 2021-08-18: 960 mL via RECTAL
  Filled 2021-08-18: qty 473

## 2021-08-18 MED ORDER — ACETAMINOPHEN 325 MG PO TABS
650.0000 mg | ORAL_TABLET | Freq: Four times a day (QID) | ORAL | Status: DC | PRN
  Administered 2021-08-18 – 2021-08-19 (×5): 650 mg via ORAL
  Filled 2021-08-18 (×5): qty 2

## 2021-08-18 MED ORDER — SODIUM CHLORIDE 0.9 % IV SOLN
2.0000 g | Freq: Three times a day (TID) | INTRAVENOUS | Status: DC
  Administered 2021-08-18 – 2021-08-19 (×3): 2 g via INTRAVENOUS
  Filled 2021-08-18 (×3): qty 12.5

## 2021-08-18 MED ORDER — VANCOMYCIN HCL 750 MG/150ML IV SOLN
750.0000 mg | Freq: Two times a day (BID) | INTRAVENOUS | Status: DC
  Administered 2021-08-18 – 2021-08-19 (×2): 750 mg via INTRAVENOUS
  Filled 2021-08-18 (×2): qty 150

## 2021-08-18 MED ORDER — SACCHAROMYCES BOULARDII 250 MG PO CAPS
250.0000 mg | ORAL_CAPSULE | Freq: Two times a day (BID) | ORAL | Status: DC
  Administered 2021-08-18 – 2021-08-19 (×3): 250 mg via ORAL
  Filled 2021-08-18 (×3): qty 1

## 2021-08-18 MED ORDER — GADOBUTROL 1 MMOL/ML IV SOLN
9.4000 mL | Freq: Once | INTRAVENOUS | Status: AC | PRN
  Administered 2021-08-18: 9.4 mL via INTRAVENOUS

## 2021-08-18 NOTE — Assessment & Plan Note (Addendum)
Initial MRI brain significant for a 1 cm ring-like lesion concerning for local infection vs meningitis. Neurology consulted. Patient started empirically on ampicillin, Ceftriaxone and Vancomycin. Acyclovir was not started. Associated encephalopathy resolved. LP attempted by EDP and IR but were unsuccessful. Neurology recommend MRI w/wo contrast head and c-spine and ID consult. Repeat MRI not consistent with infection but indicates likely encephalomalacia. Antibiotics discontinued. Neurology recommendations for out patient follow-up. ?

## 2021-08-18 NOTE — Assessment & Plan Note (Addendum)
Possibly opiate induced, although patient has a chronic history. Good bowel sounds. No nausea/emesis. SMOG enema administer with positive result. ?

## 2021-08-18 NOTE — Assessment & Plan Note (Signed)
Baseline creatinine of about 1.3. Creatinine of 1.74 on admission with peak of 1.8. Now back to baseline. ?

## 2021-08-18 NOTE — Progress Notes (Addendum)
S: Patient seen at bedside, feeling well, eating a cheeseburger. Awaiting MRI brain and c spine wwo contrast scheduled for later today. ID saw him this AM and adjusted abx (stopped ceftriaxone and ampicillin, started cefepime, continued vancomycin). No new complaints today. ? ?MRI brain without contrast showed an approximately 1 cm ringlike lesion in the left occipital lobe of unclear etiology but given the history of encephalopathy in the context of neurosurgery within the past week could be concerning for abscess/localized infection (personal review). ? ?rEEG on admission WNL ? ?LP attempted at bedside and in IR, both unsuccessful ? ?O: ? ?Vitals:  ? 08/18/21 1244 08/18/21 1559  ?BP: 131/90 (!) 163/92  ?Pulse: 66 66  ?Resp: 14 19  ?Temp: (!) 97.5 ?F (36.4 ?C) 97.7 ?F (36.5 ?C)  ?SpO2: 99% 100%  ? ? ?Physical Exam ?Gen: A&Ox4, NAD ?Resp: CTAB, normal work of breathing ?CV: RRR, extremities appear well-perfused. ? ?Neuro: ?*MS: A&O x4. Follows multi-step commands.  ?*Speech: no dysarthria or aphasia, able to name and repeat. ?*CN:  ?  I: Deferred ?  II,III: PERRLA, VFF by confrontation, optic discs not visualized 2/2 pupillary constriction ?  III,IV,VI: EOMI w/o nystagmus, no ptosis ?  V: Sensation intact from V1 to V3 to LT ?  VII: Eyelid closure was full.  Smile symmetric. ?  VIII: Hearing intact to voice ?  IX,X: Voice normal, palate elevates symmetrically  ?  XI: SCM/trap 5/5 bilat   ?XII: Tongue protrudes midline, no atrophy or fasciculations  ?*Motor:   Normal bulk.  No tremor, rigidity or bradykinesia. Anti-gravity BUE, 5/5 strength throughout BLE, symmetric. ?*Sensory: SILT ?*Reflexes:  2+ brisk and symmetric throughout without clonus; toes mute bilat ?*Gait: deferred ? ?A/P: Patient presented with episode of severe encephalopathy and quadriplegia 6 days after cervical spine surgery. He rapidly improved to baseline mental status and extremity strength. EEG overnight was normal.  MRI brain without contrast  showed an approximately 1 cm ringlike lesion in the left occipital lobe of unclear etiology but given the history of encephalopathy in the context of neurosurgery within the past week could be concerning for abscess/localized infection.  LP was attempted by EDP at bedside but was not successful.  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  He was initially started on vanc/ceftriaxone/ampicillin for empiric CNS coverage which was changed to vanc + cefepime by ID on 5/13. He is awaiting MRI brain and c spine with and without contrast this evening for further evaluation. ID, NSU, Neuro all following. Abx per ID. ? ?Su Monks, MD ?[facility] ?[phone] ? ?If 7pm- 7am, please page neurology on call as listed in [place]. ? ?

## 2021-08-18 NOTE — Progress Notes (Signed)
We stopped by to see the patient.  He is not complaining of neck pain.  He states he is doing well from his surgery.  He had preoperative weakness in the proximal shoulder girdles and he states that is improving.  His hands are doing well and he has no numbness tingling or weakness in the hands.  He is using his phone at present.  He has good dexterity.  He is awake and alert and appears nontoxic. ? ?I reviewed his MRI of the brain which was done without contrast and there is a small ringlike lesion in the left occipital region without edema or mass effect.  Difficult to know exactly what this is but I would doubt abscess as it is not seen on the CT scan, and if it is an abscess it is unrelated to the surgery as he would still be in the cerebritis stage and not in the capsular stage of abscess formation this soon after surgery.  I do agree that an MRI of the brain with and without contrast is reasonable but this is a fairly benign looking lesion, and I suspect it is unrelated to his recent surgery and unrelated to his recent encephalopathy. ? ?Agree with lumbar puncture if it can be done.  His body habitus should allow for lumbar puncture to be done ? ?Review of the MRI of the brain with and without contrast ? ?He appears to be doing very well from his cervical spine surgery.  The infection rate after ACDF nationwide is about 0.05%, so it is fairly unlikely that he has a postoperative infection, especially given how well he is doing clinically ?

## 2021-08-18 NOTE — Progress Notes (Signed)
? ?PROGRESS NOTE ? ? ? ?ERMAL HABERER  KXF:[id number] DOB: 1961/02/08 DOA: 08/16/2021 ?PCP: [name], MD ? ? ?Brief Narrative: ?[name] is a 61 y.o. male with a history of hypertension, paroxysmal atrial fibrillation, CKD stage III, history of cervical spine radiculopathy status post cervical fusion 3 through 30 on Aug 10, 2021 who presented secondary to altered mental status. Initial concern for stroke. Neurology consulted. Code stroke called with imaging not suggestive of acute stroke, but MRI significant for a 1 cm ring-like lesion suggestive of possible infectious etiology. LP ordered but unable to complete. Mental status significantly improved day after admission. Patient is on empiric antibiotics. ? ? ?Assessment and Plan: ?* Acute metabolic encephalopathy-resolved as of 08/18/2021 ?Resolved. Concern for infectious process. ?-See problem, Abnormal brain MRI ? ?Abnormal brain MRI ?Initial MRI brain significant for a 1 cm ring-like lesion concerning for local infection vs meningitis. Neurology consulted. Patient started empirically on ampicillin, Ceftriaxone and Vancomycin. Acyclovir was not started. Associated encephalopathy resolved. LP attempted by EDP and IR but were unsuccessful. Neurology recommend MRI w/wo contrast head and c-spine and ID consult ?-ID consult (5/13) ?-Await MRI results ?-Continue empiric ampicillin, Ceftriaxone and Vancomycin ? ?S/P cervical spinal fusion - C3-6 ?Noted. Currently in cervical collar. ? ?COPD (chronic obstructive pulmonary disease) ([place]) ?Stable. On RA. ? ?PAF (paroxysmal atrial fibrillation) ([place]) ?Currently in normal sinus rhythm. On Eliquis as an outpatient which is held currently. Patient is also managed on sotalol ?-Will consider restarting Eliquis pending MRI results ?-Continue home sotalol ? ?Stage 3a chronic kidney disease (CKD) ([place]) ?Stable. ? ?HTN (hypertension) ?Patient is on amlodipine, Imdur, hydrochlorothiazide and triamterene as an  outpatient. Started on nitroglycerin patch on admission. Patch discontinued and home Imdur restarted ?-Continue Imdur ?-Hold hydrochlorothiazide and triamterene secondary to kidney injury ? ?Constipation ?Possibly opiate induced, although patient has a chronic history. Good bowel sounds. No nausea/emesis. ?-SMOG enema ? ?AKI (acute kidney injury) ([facility])-resolved as of 08/18/2021 ?Baseline creatinine of about 1.3. Creatinine of 1.74 on admission with peak of 1.8. Now back to baseline. ? ? ? ?DVT prophylaxis: SCDs, restart Eliquis after LP ?Code Status:   Code Status: Full Code ?Family Communication: Girlfriend at bedside ?Disposition Plan: Discharge home likely  ? ? ?Consultants:  ?Neurology ? ?Procedures:  ?None ? ?Antimicrobials: ?Ceftriaxone ?Ampicillin ?Vancomycin  ? ? ?Subjective: ?Uncomfortable from not having a bowel movement yet. ? ?Objective: ?BP (!) 124/92 (BP Location: Left Arm)   Pulse 83   Temp 98.2 ?F (36.8 ?C) (Oral)   Resp 17   Ht 6' (1.829 m)   Wt 94.4 kg   SpO2 98%   BMI 28.22 kg/m?  ? ?Examination: ? ?General exam: Appears calm and comfortable ?Respiratory system: Clear to auscultation. Respiratory effort normal. ?Cardiovascular system: S1 & S2 heard, RRR. No murmurs, rubs, gallops or clicks. ?Gastrointestinal system: Abdomen is distended, soft and nontender. Normal bowel sounds heard. ?Central nervous system: Alert and oriented. No focal neurological deficits. ?Musculoskeletal: No edema. No calf tenderness ?Skin: No cyanosis. No rashes ?Psychiatry: Judgement and insight appear normal. Mood & affect appropriate.  ? ? ?Data Reviewed: I have personally reviewed following labs and imaging studies ? ?CBC ?Lab Results  ?Component Value Date  ? WBC 6.0 08/17/2021  ? RBC 4.17 (L) 08/17/2021  ? HGB 11.9 (L) 08/17/2021  ? HCT 35.4 (L) 08/17/2021  ? MCV 84.9 08/17/2021  ? MCH 28.5 08/17/2021  ? PLT 268 08/17/2021  ? MCHC 33.6 08/17/2021  ? RDW 12.6 08/17/2021  ?  LYMPHSABS 1.7 08/17/2021  ? MONOABS 0.7  08/17/2021  ? EOSABS 0.3 08/17/2021  ? BASOSABS 0.0 08/17/2021  ? ? ? ?Last metabolic panel ?Lab Results  ?Component Value Date  ? NA 137 08/17/2021  ? K 3.6 08/17/2021  ? CL 103 08/17/2021  ? CO2 26 08/17/2021  ? BUN 12 08/17/2021  ? CREATININE 1.36 (H) 08/17/2021  ? GLUCOSE 113 (H) 08/17/2021  ? GFRNONAA 59 (L) 08/17/2021  ? GFRAA >60 08/23/2019  ? CALCIUM 8.9 08/17/2021  ? PROT 7.1 08/17/2021  ? ALBUMIN 3.6 08/17/2021  ? BILITOT 0.6 08/17/2021  ? ALKPHOS 67 08/17/2021  ? AST 23 08/17/2021  ? ALT 21 08/17/2021  ? ANIONGAP 8 08/17/2021  ? ? ?GFR: ?Estimated Creatinine Clearance: 67.2 mL/min (A) (by C-G formula based on SCr of 1.36 mg/dL (H)). ? ?Recent Results (from the past 240 hour(s))  ?Resp Panel by RT-PCR (Flu A&B, Covid) Nasopharyngeal Swab     Status: None  ? Collection Time: 08/16/21  5:48 PM  ? Specimen: Nasopharyngeal Swab; Nasopharyngeal(NP) swabs in vial transport medium  ?Result Value Ref Range Status  ? SARS Coronavirus 2 by RT PCR NEGATIVE NEGATIVE Final  ?  Comment: (NOTE) ?SARS-CoV-2 target nucleic acids are NOT DETECTED. ? ?The SARS-CoV-2 RNA is generally detectable in upper respiratory ?specimens during the acute phase of infection. The lowest ?concentration of SARS-CoV-2 viral copies this assay can detect is ?138 copies/mL. A negative result does not preclude SARS-Cov-2 ?infection and should not be used as the sole basis for treatment or ?other patient management decisions. A negative result may occur with  ?improper specimen collection/handling, submission of specimen other ?than nasopharyngeal swab, presence of viral mutation(s) within the ?areas targeted by this assay, and inadequate number of viral ?copies(<138 copies/mL). A negative result must be combined with ?clinical observations, patient history, and epidemiological ?information. The expected result is Negative. ? ?Fact Sheet for Patients:  ?EntrepreneurPulse.com.au ? ?Fact Sheet for Healthcare Providers:   ?IncredibleEmployment.be ? ?This test is no t yet approved or cleared by the [place] FDA and  ?has been authorized for detection and/or diagnosis of SARS-CoV-2 by ?FDA under an Emergency Use Authorization (EUA). This EUA will remain  ?in effect (meaning this test can be used) for the duration of the ?COVID-19 declaration under Section 564(b)(1) of the Act, 21 ?U.S.C.section 360bbb-3(b)(1), unless the authorization is terminated  ?or revoked sooner.  ? ? ?  ? Influenza A by PCR NEGATIVE NEGATIVE Final  ? Influenza B by PCR NEGATIVE NEGATIVE Final  ?  Comment: (NOTE) ?The Xpert Xpress SARS-CoV-2/FLU/RSV plus assay is intended as an aid ?in the diagnosis of influenza from Nasopharyngeal swab specimens and ?should not be used as a sole basis for treatment. Nasal washings and ?aspirates are unacceptable for Xpert Xpress SARS-CoV-2/FLU/RSV ?testing. ? ?Fact Sheet for Patients: ?EntrepreneurPulse.com.au ? ?Fact Sheet for Healthcare Providers: ?IncredibleEmployment.be ? ?This test is not yet approved or cleared by the [place] FDA and ?has been authorized for detection and/or diagnosis of SARS-CoV-2 by ?FDA under an Emergency Use Authorization (EUA). This EUA will remain ?in effect (meaning this test can be used) for the duration of the ?COVID-19 declaration under Section 564(b)(1) of the Act, 21 U.S.C. ?section 360bbb-3(b)(1), unless the authorization is terminated or ?revoked. ? ?Performed at [facility], [address], [place], [place] ?[address] ?  ?Culture, blood (Routine X 2) w Reflex to ID Panel     Status: None (Preliminary result)  ? Collection Time: 08/17/21  8:20  PM  ? Specimen: BLOOD  ?Result Value Ref Range Status  ? Specimen Description BLOOD BLOOD RIGHT HAND  Final  ? Special Requests   Final  ?  BOTTLES DRAWN AEROBIC ONLY Blood Culture adequate volume  ? Culture   Final  ?  NO GROWTH < 12 HOURS ?Performed at [facility],  [place] [address], [place], [place] [address] ?  ? Report Status PENDING  Incomplete  ?Culture, blood (Routine X 2) w Reflex to ID Panel     Status: None (Preliminary result)  ? Collection Time: 08/17/21  8:27 PM  ? Specimen: BLOOD  ?Result V

## 2021-08-18 NOTE — Progress Notes (Signed)
Pharmacy Antibiotic Note ? ?[name] is a 61 y.o. male admitted on 08/16/2021 with possible meningitis. Lumbar puncture unable to be performed following multiple attempts. 5/12 MRI brain without contrast w/ ring-like lesion of occipital lobe concerning for possible CNS infection given recent spinal surgery although NSGY notes it is unlikely for an abscess to have formed so soon after surgery. Pharmacy has been consulted for vancomycin and cefepime dosing.  ? ?5/13: Encephalopathy improved, Scr 1.36 (improved), WBC WNL, afebrile  ? ?Plan: ?Continue Vancomycin 750 mg IV Q12H- F/U Vanc trough 5/14 AM  ?STOP Ceftriaxone and Ampicillin  ?START Cefepime 2g IV Q8H  ?Monitor renal function, de-escalation, clinical status  ?F/u vancomycin troughs as appropriate (goal 15-20) ? ?Height: 6' (182.9 cm) ?Weight: 94.4 kg (208 lb 1.6 oz) ?IBW/kg (Calculated) : 77.6 ? ?Temp (24hrs), Avg:98.3 ?F (36.8 ?C), Min:97.5 ?F (36.4 ?C), Max:99.1 ?F (37.3 ?C) ? ?Recent Labs  ?Lab 08/16/21 ?1750 08/16/21 ?5093 08/17/21 ?0329  ?WBC 6.6  --  6.0  ?CREATININE 1.74* 1.80* 1.36*  ? ?  ?Estimated Creatinine Clearance: 67.2 mL/min (A) (by C-G formula based on SCr of 1.36 mg/dL (H)).   ? ?No Known Allergies ? ?Antimicrobials this admission: ?5/11 ceftriaxone >> 5/12 ?5/11 ampicillin >> 5/12 ?5/11 vancomycin >> ?5/12 Cefepime>> ? ?Dose adjustments this admission: ?N/A ? ?Microbiology results: ?5/12 Bcx: NGTD  ? ?Adria Dill, PharmD ?PGY-1 Acute Care Resident  ?08/18/2021 2:10 PM  ? ? ?

## 2021-08-18 NOTE — Progress Notes (Signed)
Paged on call provider for constipation management. Patient reports no BM since surgery last week. Miralax ordered and given. ? ?Later on, paged MD because pt's HR up to 140s at rest. Patient c/o 6/10 chest pain. Patient reports that this happens a couple times of month. Episode lasted last than 3 minutes and when HR normalized without intervention, chest pain resolved completely. MD ordered EKG and troponin, paged MD with EKG results (NSR w/first degree heart block which pt reports hx of).  ? ?In discussion with pt, he hadn't received his Sotalol on Friday or Thursday evening. Administered Sotalol early and let MD know.  ? ?Will continue to follow current plan of care.  ? ? ?

## 2021-08-18 NOTE — Consult Note (Signed)
?  [name] for Infectious Disease  ? ? ?Date of Admission:  08/16/2021    ? ?Reason for Consult: CNS Infection ?    ?Referring Physician: Dr Lonny Prude ? ?Current antibiotics: ?Ampicillin 5/11 - present ?Ceftriaxone 5/11 - present ?Vancomycin 5/11 - present ? ? ?ASSESSMENT:   ? ?61 y.o. male admitted with: ? ?Concern for CNS infection following neurosurgery: Patient presented with altered mental status and MRI brain without contrast 5/12 significant for a 1 cm ringlike lesion with adjacent subtle leptomeningeal signal involving the left occipital lobe.  Patient unable to undergo lumbar puncture after multiple attempts to evaluate the CSF for cell count and cultures.  He has been on broad-spectrum antibiotics and his encephalopathy has improved.  Further imaging of the brain and cervical spine with contrast is currently pending to further evaluate for CNS infection or complication related to recent neurosurgery.  His blood cultures at this time are no growth, however, they were obtained after starting antibiotics. ?Cervical spine stenosis with myelopathy: Status post C3-6 anterior cervical discectomy with interbody fusion utilizing interbody cages, local harvested autograft, and anterior plate instrumentation with Dr. Trenton Gammon on 08/10/2021. ?Chronic kidney disease stage III: Creatinine is stable. ? ?RECOMMENDATIONS:   ? ?Continue vancomycin ?Will change to cefepime in setting of possible CNS infection after NSGY ?Stop ceftriaxone ?Stop ampicillin ?Lab monitoring ?Follow up cultures ?Follow up MRI with contrast ?Will follow ? ? ?Principal Problem: ?  CNS infection ?Active Problems: ?  HTN (hypertension) ?  Stage 3a chronic kidney disease (CKD) ([place]) ?  PAF (paroxysmal atrial fibrillation) ([place]) ?  COPD (chronic obstructive pulmonary disease) ([place]) ?  S/P cervical spinal fusion - C3-6 ?  Abnormal brain MRI ?  Constipation ? ? ?MEDICATIONS:   ? ?Scheduled Meds: ? isosorbide mononitrate  30 mg Oral Daily  ? mouth rinse   15 mL Mouth Rinse BID  ? sotalol  80 mg Oral Q12H  ? ?Continuous Infusions: ? ampicillin (OMNIPEN) IV 2 g (08/18/21 1301)  ? cefTRIAXone (ROCEPHIN)  IV 2 g (08/18/21 0920)  ? vancomycin    ? ?PRN Meds:.acetaminophen, labetalol, lip balm, ondansetron (ZOFRAN) IV, polyethylene glycol ? ?HPI:   ? ?[name] is a 61 y.o. male with a past medical history of hypertension, atrial fibrillation, CKD stage III, recent history of cervical spine surgery with cervical fusion on 08/10/2021 who presented to the hospital yesterday with altered mental status.  There was initial concern for a stroke and neurology was consulted.  Imaging was not suggestive of acute stroke but more concerning for possible infectious etiology in the brain.  A lumbar puncture was attempted to be completed however this was unsuccessful after many attempts.  His mental status significantly improved the day after admission.  He has empirically been on ampicillin, ceftriaxone, vancomycin.  Acyclovir was ordered but never started and has noted his encephalopathy improved.  We have been consulted for further recommendations. ? ? ?Past Medical History:  ?Diagnosis Date  ? A-fib ([place])   ? Chest pain   ? a. 11/2002 Cath: EF 68%, LM nl, LAD small, nl, LCX nl, RCA tortuous/nl;  b. 08/2011 St Echo: EF 60%, normal contractility, no scar/ischemia.  ? COPD (chronic obstructive pulmonary disease) ([place])   ? Dysrhythmia   ? Enlarged prostate   ? Gastrointestinal hemorrhage 08/25/2018  ? GERD (gastroesophageal reflux disease)   ? GI bleeding 08/2018  ? Hypertension   ? Sickle cell trait ([place])   ? Sleep apnea   ? SVT (supraventricular  tachycardia) ([place])   ? ? ?Social History  ? ?Tobacco Use  ? Smoking status: Never  ? Smokeless tobacco: Never  ?Vaping Use  ? Vaping Use: Never used  ?Substance Use Topics  ? Alcohol use: No  ? Drug use: Never  ? ? ?Family History  ?Problem Relation Age of Onset  ? CAD Mother   ?     died in her 73's - MI  ? Multiple myeloma Brother   ?  Sickle cell anemia Father   ?     died @ 96  ? Gastric cancer Neg Hx   ? ? ?No Known Allergies ? ?Review of Systems  ?All other systems reviewed and are negative. Except as noted above in HPI.  ? ?OBJECTIVE:  ? ?Blood pressure 131/90, pulse 66, temperature (!) 97.5 ?F (36.4 ?C), temperature source Oral, resp. rate 14, height 6' (1.829 m), weight 94.4 kg, SpO2 99 %. ?Body mass index is 28.22 kg/m?. ? ?Physical Exam ?Constitutional:   ?   General: He is not in acute distress. ?   Appearance: Normal appearance.  ?HENT:  ?   Head: Normocephalic and atraumatic.  ?Eyes:  ?   Extraocular Movements: Extraocular movements intact.  ?   Conjunctiva/sclera: Conjunctivae normal.  ?Neck:  ?   Comments: Anterior cervical incision steri-strips in place.  No warmth, fluctuance or drainage around his incision site.  ?Pulmonary:  ?   Effort: Pulmonary effort is normal. No respiratory distress.  ?Abdominal:  ?   General: There is no distension.  ?   Palpations: Abdomen is soft.  ?   Tenderness: There is no abdominal tenderness.  ?Musculoskeletal:     ?   General: Normal range of motion.  ?   Right lower leg: No edema.  ?   Left lower leg: No edema.  ?Skin: ?   General: Skin is warm and dry.  ?   Findings: No rash.  ?Neurological:  ?   General: No focal deficit present.  ?   Mental Status: He is alert and oriented to person, place, and time.  ?   Cranial Nerves: No cranial nerve deficit.  ?Psychiatric:     ?   Mood and Affect: Mood normal.     ?   Behavior: Behavior normal.  ? ? ? ?Lab Results: ?Lab Results  ?Component Value Date  ? WBC 6.0 08/17/2021  ? HGB 11.9 (L) 08/17/2021  ? HCT 35.4 (L) 08/17/2021  ? MCV 84.9 08/17/2021  ? PLT 268 08/17/2021  ?  ?Lab Results  ?Component Value Date  ? NA 137 08/17/2021  ? K 3.6 08/17/2021  ? CO2 26 08/17/2021  ? GLUCOSE 113 (H) 08/17/2021  ? BUN 12 08/17/2021  ? CREATININE 1.36 (H) 08/17/2021  ? CALCIUM 8.9 08/17/2021  ? GFRNONAA 59 (L) 08/17/2021  ? GFRAA >60 08/23/2019  ?  ?Lab Results   ?Component Value Date  ? ALT 21 08/17/2021  ? AST 23 08/17/2021  ? ALKPHOS 67 08/17/2021  ? BILITOT 0.6 08/17/2021  ? ? ?No results found for: CRP ? ?   ?Component Value Date/Time  ? ESRSEDRATE 7 12/08/2013 0406  ? ? ?I have reviewed the micro and lab results in Epic. ? ?Imaging: ?MR BRAIN WO CONTRAST ? ?Result Date: 08/17/2021 ?CLINICAL DATA:  Initial evaluation for CNS infection/meningitis suspected. EXAM: MRI HEAD WITHOUT CONTRAST TECHNIQUE: Multiplanar, multiecho pulse sequences of the brain and surrounding structures were obtained without intravenous contrast. COMPARISON:  Prior CTs from 08/12/2021. FINDINGS: Brain:  Cerebral volume within normal limits. Mild scattered patchy T2/signal abnormality seen involving the periventricular deep white matter both cerebral hemispheres, nonspecific, but most typical of chronic microvascular ischemic disease. Overall, appearance is mild in nature. No diffusion abnormality to suggest acute or subacute infarct or changes related to seizure. No areas of chronic cortical infarction. There is a small ring-like lesion measuring approximately 1 cm with FLAIR hyperintense rim overlying the left occipital lobe (series 11, image 15). Unclear whether this is intra-axial or extra-axial, however, no significant surrounding parenchymal edema is evident, suggesting that this may be extra-axial in nature. Mild adjacent leptomeningeal FLAIR signal intensity present as well (series 11, image 16). Associated susceptibility artifact (series 14, image 29. No visible hemorrhage seen at this location on prior head CT. No associated restricted diffusion to suggest abscess or empyema formation. While this finding is nonspecific, changes of localized meningitis could potentially have this appearance. Otherwise, no other convincing evidence for CNS infection elsewhere within the brain. No parenchymal changes of acute cerebritis or encephalitis. No visible intraventricular debris. Ventricles M cells  are normal size without hydrocephalus. No other mass lesion, midline shift, or mass effect. No other extra-axial fluid collection. Pituitary gland suprasellar region within normal limits. Midline structures intact

## 2021-08-19 DIAGNOSIS — G9341 Metabolic encephalopathy: Secondary | ICD-10-CM

## 2021-08-19 LAB — BASIC METABOLIC PANEL
Anion gap: 7 (ref 5–15)
BUN: 9 mg/dL (ref 8–23)
CO2: 24 mmol/L (ref 22–32)
Calcium: 8.7 mg/dL — ABNORMAL LOW (ref 8.9–10.3)
Chloride: 107 mmol/L (ref 98–111)
Creatinine, Ser: 1.44 mg/dL — ABNORMAL HIGH (ref 0.61–1.24)
GFR, Estimated: 55 mL/min — ABNORMAL LOW (ref >60)
Glucose, Bld: 113 mg/dL — ABNORMAL HIGH (ref 70–99)
Potassium: 3.3 mmol/L — ABNORMAL LOW (ref 3.5–5.1)
Sodium: 138 mmol/L (ref 135–145)

## 2021-08-19 LAB — VANCOMYCIN, TROUGH: Vancomycin Tr: 14 ug/mL — ABNORMAL LOW (ref 15–20)

## 2021-08-19 MED ORDER — TRIAMTERENE-HCTZ 37.5-25 MG PO CAPS: 1.0000 | ORAL_CAPSULE | Freq: Every morning | ORAL | Status: AC

## 2021-08-19 MED ORDER — APIXABAN 5 MG PO TABS
5.0000 mg | ORAL_TABLET | Freq: Two times a day (BID) | ORAL | Status: DC
  Administered 2021-08-19: 5 mg via ORAL
  Filled 2021-08-19: qty 1

## 2021-08-19 MED ORDER — AMLODIPINE BESYLATE 5 MG PO TABS
5.0000 mg | ORAL_TABLET | Freq: Every day | ORAL | Status: DC
Start: 2021-08-19 — End: 2021-08-19
  Administered 2021-08-19: 5 mg via ORAL
  Filled 2021-08-19: qty 1

## 2021-08-19 MED ORDER — HYDROCODONE-ACETAMINOPHEN 5-325 MG PO TABS
1.0000 | ORAL_TABLET | ORAL | Status: DC | PRN
  Administered 2021-08-19: 2 via ORAL
  Filled 2021-08-19: qty 2

## 2021-08-19 MED ORDER — POTASSIUM CHLORIDE CRYS ER 20 MEQ PO TBCR
40.0000 meq | EXTENDED_RELEASE_TABLET | Freq: Once | ORAL | Status: AC
Start: 2021-08-19 — End: 2021-08-19
  Administered 2021-08-19: 40 meq via ORAL
  Filled 2021-08-19: qty 2

## 2021-08-19 MED ORDER — VANCOMYCIN HCL IN DEXTROSE 1-5 GM/200ML-% IV SOLN
1000.0000 mg | Freq: Two times a day (BID) | INTRAVENOUS | Status: DC
  Filled 2021-08-19: qty 200

## 2021-08-19 NOTE — Progress Notes (Addendum)
S: Patient seen at bedside, feeling well. No new neurologic complaints. BUE weakness slowly improving each day from pre-op baseline. ? ?MRI brain wwo contrast showed no contrast enhancement or surrounding edema of 1cm ringlike lesion in L occipital lobe. Personally reviewed; I agree with radiology this likely represents encephalomalacia 2/2 remote ischemic infarct ? ?MRI brain without contrast showed an approximately 1 cm ringlike lesion in the left occipital lobe of unclear etiology but given the history of encephalopathy in the context of neurosurgery within the past week could be concerning for abscess/localized infection (personal review). ? ?MRI c spine wwo ?1. Postoperative changes from recent ACDF at C3-4, C4-5, and H0-8 ?without complication. No evidence for epidural abscess or other ?postoperative infection by MRI. Improved thecal sac patency at these ?levels. ?2. Residual moderate to severe bilateral C5 and C6 foraminal ?stenosis, similar to perhaps slightly improved from prior. ? ?rEEG on admission WNL ? ?O: ? ?Vitals:  ? 08/19/21 0400 08/19/21 0753  ?BP: (!) 132/92 (!) 137/97  ?Pulse:    ?Resp: 20 17  ?Temp: 99.2 ?F (37.3 ?C)   ?SpO2: 99%   ? ? ?Physical Exam ?Gen: A&Ox4, NAD ?Resp: CTAB, normal work of breathing ?CV: RRR, extremities appear well-perfused. ? ?Neuro: ?*MS: A&O x4. Follows multi-step commands.  ?*Speech: no dysarthria or aphasia, able to name and repeat. ?*CN:  ?  I: Deferred ?  II,III: PERRLA, VFF by confrontation, optic discs not visualized 2/2 pupillary constriction ?  III,IV,VI: EOMI w/o nystagmus, no ptosis ?  V: Sensation intact from V1 to V3 to LT ?  VII: Eyelid closure was full.  Smile symmetric. ?  VIII: Hearing intact to voice ?  IX,X: Voice normal, palate elevates symmetrically  ?  XI: SCM/trap 5/5 bilat   ?XII: Tongue protrudes midline, no atrophy or fasciculations  ?*Motor:   Normal bulk.  No tremor, rigidity or bradykinesia. BUE 4-/5 diffusely, 5/5 strength throughout BLE,  symmetric. ?*Sensory: SILT ?*Reflexes:  2+ brisk and symmetric throughout without clonus; toes mute bilat ?*Gait: deferred ? ?A/P: Patient presented with episode of severe encephalopathy and quadriplegia 6 days after cervical spine surgery. He rapidly improved to baseline mental status and extremity strength. EEG overnight was normal.  MRI brain without contrast showed an approximately 1 cm ringlike lesion in the left occipital lobe of unclear etiology but given the history of encephalopathy in the context of neurosurgery within the past week could be concerning for abscess/localized infection.  LP was attempted by EDP at bedside but was not successful.  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  He was initially started on vanc/ceftriaxone/ampicillin for empiric CNS coverage which was changed to vanc + cefepime by ID on 5/13. MRI brain wwo contrast showed no contrast enhancement or surrounding edema of 1cm ringlike lesion in L occipital lobe. Personally reviewed; I agree with radiology this likely represents encephalomalacia 2/2 remote ischemic infarct. From my standpoint, no further indication for abx or further inpatient neurologic workup. Will d/w ID. I will restart eliquis for secondary stroke prevention in the setting of a fib. I explained the MRI findings to patient who was relieved. I told him that we cannot explain exactly what happened to cause his atypical presentation, but that we have ruled out the most dangerous etiologies. He is satisfied with this. I will arrange for him to have outpatient neurology f/u (will likely only need one visit if  sx do not recur). OK to discharge from neuro standpoint unless there are other medical issues to address. ? ? ?Su Monks, MD ?[facility] ?[phone] ? ?If 7pm- 7am, please page neurology on call as listed in [place]. ? ?

## 2021-08-19 NOTE — Progress Notes (Signed)
?   ? ?[facility] ? ?Date of Admission:  08/16/2021    ?       ?Reason for visit: Follow up on possible CNS infection ? ? ?ASSESSMENT:   ? ?61 y.o. male admitted with: ? ?Concern for CNS infection following neurosurgery: Patient's MRI of the brain and cervical spine with contrast was overall very reassuring without any evidence of infection or epidural abscess. ?Cervical spine stenosis with myelopathy: Status post C3-6 anterior cervical discectomy with interbody fusion utilizing interbody cages, local harvested autograft, and anterior plate instrumentation with Dr. Trenton Gammon on 08/10/2021. ?Chronic kidney disease stage III: Creatinine has been stable. ? ?RECOMMENDATIONS:   ? ?After review of the images and discussion with his primary team as well as neurology agree that infection seems unlikely and recommend stopping antibiotics. ?We will sign off please call as needed. ? ? ?Active Problems: ?  HTN (hypertension) ?  Stage 3a chronic kidney disease (CKD) ([place]) ?  PAF (paroxysmal atrial fibrillation) ([place]) ?  COPD (chronic obstructive pulmonary disease) ([place]) ?  S/P cervical spinal fusion - C3-6 ?  Abnormal brain MRI ?  Constipation ? ? ? ?MEDICATIONS:   ? ?Scheduled Meds: ? amLODipine  5 mg Oral Daily  ? apixaban  5 mg Oral BID  ? isosorbide mononitrate  30 mg Oral Daily  ? mouth rinse  15 mL Mouth Rinse BID  ? saccharomyces boulardii  250 mg Oral BID  ? sotalol  80 mg Oral Q12H  ? ?Continuous Infusions: ? ceFEPime (MAXIPIME) IV 2 g (08/19/21 0175)  ? vancomycin    ? ?PRN Meds:.acetaminophen, HYDROcodone-acetaminophen, labetalol, lip balm, ondansetron (ZOFRAN) IV, polyethylene glycol ? ?SUBJECTIVE:  ? ?24 hour events:  ?No acute events ?Afebrile, Tmax 99.2 ?Creatinine at baseline ?Blood cultures negative ? ?Patient seen this afternoon.  He is hoping for discharge home today.  He feels well.  He was very relieved to find out he does not have an infection. ? ?Review of Systems  ?All other systems  reviewed and are negative. ? ?  ?OBJECTIVE:  ? ?Blood pressure (!) 156/95, pulse 64, temperature 99.2 ?F (37.3 ?C), temperature source Oral, resp. rate 17, height 6' (1.829 m), weight 94.4 kg, SpO2 100 %. ?Body mass index is 28.22 kg/m?. ? ?Physical Exam ?Constitutional:   ?   General: He is not in acute distress. ?   Appearance: Normal appearance.  ?HENT:  ?   Head: Normocephalic and atraumatic.  ?Eyes:  ?   Extraocular Movements: Extraocular movements intact.  ?   Conjunctiva/sclera: Conjunctivae normal.  ?Neck:  ?   Comments: Anterior cervical neck incision with Steri-Strips in place.  No drainage. ?Pulmonary:  ?   Effort: Pulmonary effort is normal. No respiratory distress.  ?Abdominal:  ?   General: There is no distension.  ?   Palpations: Abdomen is soft.  ?Musculoskeletal:     ?   General: Normal range of motion.  ?   Cervical back: Normal range of motion and neck supple.  ?Skin: ?   General: Skin is warm and dry.  ?   Findings: No rash.  ?Neurological:  ?   General: No focal deficit present.  ?   Mental Status: He is alert and oriented to person, place, and time.  ?Psychiatric:     ?   Mood and Affect: Mood normal.     ?   Behavior: Behavior normal.  ? ? ? ?Lab Results: ?Lab Results  ?Component Value Date  ?  WBC 6.0 08/17/2021  ? HGB 11.9 (L) 08/17/2021  ? HCT 35.4 (L) 08/17/2021  ? MCV 84.9 08/17/2021  ? PLT 268 08/17/2021  ?  ?Lab Results  ?Component Value Date  ? NA 138 08/19/2021  ? K 3.3 (L) 08/19/2021  ? CO2 24 08/19/2021  ? GLUCOSE 113 (H) 08/19/2021  ? BUN 9 08/19/2021  ? CREATININE 1.44 (H) 08/19/2021  ? CALCIUM 8.7 (L) 08/19/2021  ? GFRNONAA 55 (L) 08/19/2021  ? GFRAA >60 08/23/2019  ?  ?Lab Results  ?Component Value Date  ? ALT 21 08/17/2021  ? AST 23 08/17/2021  ? ALKPHOS 67 08/17/2021  ? BILITOT 0.6 08/17/2021  ? ? ?No results found for: CRP ? ?   ?Component Value Date/Time  ? ESRSEDRATE 7 12/08/2013 0406  ? ?  ?I have reviewed the micro and lab results in Epic. ? ?Imaging: ?MR BRAIN W WO  CONTRAST ? ?Result Date: 08/19/2021 ?CLINICAL DATA:  Follow-up examination for focal signal abnormality at the left occipital lobe, acute encephalopathy, question CNS infection, recent ACDF. EXAM: MRI HEAD WITHOUT AND WITH CONTRAST TECHNIQUE: Multiplanar, multiecho pulse sequences of the brain and surrounding structures were obtained without and with intravenous contrast. CONTRAST:  9.8m GADAVIST GADOBUTROL 1 MMOL/ML IV SOLN COMPARISON:  Comparison made with recent noncontrast brain MRI from 08/17/2021. FINDINGS: Brain: Previously noted focal ring-like FLAIR signal abnormality involving the left occipital lobe again seen, stable from recent exam (series 11, image 13). No significant interval change from prior. Additionally, somewhat better seen on today's study is there appears to be subtle associated volume loss and susceptibility artifact at this location. No associated edema or enhancement with contrast administration. Given these findings and further consideration, this is favored to reflect a small focus of encephalomalacia, likely due to a prior infarct. Underlying mild chronic microvascular ischemic disease and noted. No evidence for acute or interval infarction. Gray-white matter differentiation maintained. No other areas of chronic cortical infarction. No other acute or chronic intracranial blood products. No mass lesion, mass effect or midline shift. Stable ventricular size without hydrocephalus. No extra-axial fluid collection. No other abnormal enhancement. Vascular: Major intracranial vascular flow voids are maintained. Skull and upper cervical spine: Craniocervical junction within normal limits. Postsurgical changes partially visualize within the upper cervical spine. Bone marrow signal intensity within normal limits. No scalp soft tissue abnormality. Sinuses/Orbits: Globes and orbital soft tissues within normal limits. Scattered mucosal thickening noted about the ethmoidal air cells. Paranasal sinuses  are otherwise clear. No significant mastoid effusion. Other: None. IMPRESSION: 1. Stable appearance of focal FLAIR signal abnormality involving the left occipital lobe. No associated enhancement or edema to suggest acute CNS infection at this location. Given these findings and further consideration, this is favored to reflect a small focus of encephalomalacia, likely due to a small prior infarct. 2. No other acute intracranial abnormality or evidence for CNS infection. 3. Underlying mild chronic microvascular ischemic disease. Electronically Signed   By: BJeannine BogaM.D.   On: 08/19/2021 04:42  ? ?MR CERVICAL SPINE W WO CONTRAST ? ?Result Date: 08/19/2021 ?CLINICAL DATA:  Initial evaluation for suspected epidural abscess, recent ACDF. EXAM: MRI CERVICAL SPINE WITHOUT AND WITH CONTRAST TECHNIQUE: Multiplanar and multiecho pulse sequences of the cervical spine, to include the craniocervical junction and cervicothoracic junction, were obtained without and with intravenous contrast. CONTRAST:  9.479mGADAVIST GADOBUTROL 1 MMOL/ML IV SOLN COMPARISON:  Prior MRI from 06/13/2021. FINDINGS: Alignment: Mild straightening of the normal cervical lordosis. No interval listhesis or malalignment. Vertebrae:  Postoperative changes from recent ACDF at C3-4, C4-5, and C5-6. No visible complication. Vertebral body height maintained without acute or interval fracture. Bone marrow signal intensity within normal limits. No discrete or worrisome osseous lesions. No findings to suggest osteomyelitis discitis or septic arthritis. Cord: Normal signal and morphology. Mild smooth dural enhancement about the mid cervical spine, felt to be consistent with normal expected postoperative changes. No epidural abscess or other collection. No abnormal intradural enhancement. Posterior Fossa, vertebral arteries, paraspinal tissues: Craniocervical junction within normal limits. Prominent prevertebral edema and swelling anterior to the operative  site, felt to be within normal limits for normal expected postoperative changes. Small amount of residual postoperative fluid present anterior to the fusion hardware. Fluid extends along the operative appro

## 2021-08-19 NOTE — Discharge Instructions (Signed)
[name], ? ?You were in the hospital with change in mental status. There was initial concern for an infection. You were treated with antibiotics. Thankfully, you regained your normal state of alertness/cognition very quickly. On further management, it appears there was not an infection. We cannot fully explain what occurred, but would like you to follow-up with the neurologist. I have recommended that you hold your triamterene-hydrochlorothiazide for a few days to allow your kidney numbers to improve and follow-up with your primary care physician. ?

## 2021-08-19 NOTE — Progress Notes (Signed)
Pharmacy Antibiotic Note ? ?[name] is a 61 y.o. male admitted on 08/16/2021 with possible meningitis. Lumbar puncture unable to be performed following multiple attempts. 5/12 MRI brain without contrast w/ ring-like lesion of occipital lobe concerning for possible CNS infection given recent spinal surgery although NSGY notes it is unlikely for an abscess to have formed so soon after surgery. Pharmacy has been consulted for vancomycin and cefepime dosing.  ? ?5/14: MRI brain w/w/o contrast from 5/13 noted "NO associated enhancement or edema to suggest acute CNS infection"; Vancomycin trough 14- level drawn appropriately, but 5/13 PM dose given ~1hr late  ?- Scr 1.44 (stable, BL close to 1.3), WBC WNL, afebrile  ? ?Plan: ?INCREASE Vancomycin to  ?Continue Cefepime 2g IV Q8H  ?Monitor renal function, de-escalation, clinical status  ?F/u vancomycin troughs as appropriate (goal 15-20) ?F/U ID recs for DOT  ? ?Height: 6' (182.9 cm) ?Weight: 94.4 kg (208 lb 1.6 oz) ?IBW/kg (Calculated) : 77.6 ? ?Temp (24hrs), Avg:98.5 ?F (36.9 ?C), Min:97.5 ?F (36.4 ?C), Max:99.2 ?F (37.3 ?C) ? ?Recent Labs  ?Lab 08/16/21 ?1750 08/16/21 ?1610 08/17/21 ?0329 08/19/21 ?0152 08/19/21 ?0939  ?WBC 6.6  --  6.0  --   --   ?CREATININE 1.74* 1.80* 1.36* 1.44*  --   ?[place]  --   --   --   --  14*  ? ?  ?Estimated Creatinine Clearance: 63.4 mL/min (A) (by C-G formula based on SCr of 1.44 mg/dL (H)).   ? ?No Known Allergies ? ?Antimicrobials this admission: ?5/11 ceftriaxone >> 5/12 ?5/11 ampicillin >> 5/12 ?5/11 vancomycin >> ?5/12 Cefepime>> ? ?Dose adjustments this admission: ?Vancomycin 750 mg IV Q12H>>1,000 mg IV Q12H  ? ?Microbiology results: ?5/12 Bcx: NGTD  ? ?Adria Dill, PharmD ?PGY-1 Acute Care Resident  ?08/19/2021 10:44 AM  ? ? ?

## 2021-08-19 NOTE — Progress Notes (Signed)
Nsg Discharge Note ? ?Admit Date:  08/16/2021 ?Discharge date: 08/19/2021 ?  ?[name] to be D/C'd Home per MD order.  AVS completed.  Patient/caregiver able to verbalize understanding. ? ?Discharge Medication: ?Allergies as of 08/19/2021   ?No Known Allergies ?  ? ?  ?Medication List  ?  ? ?STOP taking these medications   ? ?diclofenac 75 MG EC tablet ?Commonly known as: VOLTAREN ?  ? ?  ? ?TAKE these medications   ? ?acetaminophen 325 MG tablet ?Commonly known as: TYLENOL ?Take 650 mg by mouth every 6 (six) hours as needed for moderate pain. ?  ?amLODipine 5 MG tablet ?Commonly known as: NORVASC ?Take 1 tablet (5 mg total) by mouth daily. ?  ?apixaban 5 MG Tabs tablet ?Commonly known as: Eliquis ?Take 1 tablet (5 mg total) by mouth 2 (two) times daily. Take Eliquis 5 mg twice a day one week after GI bleed.  Do not restart Xarelto. ?What changed: additional instructions ?  ?cetirizine 10 MG tablet ?Commonly known as: ZYRTEC ?Take 10 mg by mouth daily as needed for allergies. ?  ?Colchicine 0.6 MG Caps ?Take 0.6 mg by mouth 2 (two) times daily as needed (gout flare). ?  ?cyclobenzaprine 10 MG tablet ?Commonly known as: FLEXERIL ?Take 1 tablet (10 mg total) by mouth 3 (three) times daily as needed for muscle spasms. ?  ?Emgality 120 MG/ML Soaj ?Generic drug: Galcanezumab-gnlm ?Inject 120 mg into the skin every 30 (thirty) days. ?  ?famotidine 20 MG tablet ?Commonly known as: PEPCID ?Take 40 mg by mouth daily as needed for heartburn or indigestion. ?  ?ferrous sulfate 325 (65 FE) MG tablet ?Take 1 tablet (325 mg total) by mouth daily with breakfast. ?  ?fluticasone 50 MCG/ACT nasal spray ?Commonly known as: FLONASE ?Place 1 spray into both nostrils daily as needed for allergies or rhinitis. ?  ?Fluticasone-Salmeterol 250-50 MCG/DOSE Aepb ?Commonly known as: ADVAIR ?Inhale 1 puff into the lungs 2 (two) times daily as needed (wheezing). ?  ?gabapentin 300 MG capsule ?Commonly known as: Neurontin ?Take 1 capsule  (300 mg total) by mouth 4 (four) times daily. For nerve pain ?What changed: additional instructions ?  ?guaiFENesin 600 MG 12 hr tablet ?Commonly known as: [place] ?Take 600 mg by mouth daily as needed for to loosen phlegm. ?  ?HYDROcodone-acetaminophen 5-325 MG tablet ?Commonly known as: NORCO/VICODIN ?Take 1-2 tablets by mouth every 4 (four) hours as needed for moderate pain. ?  ?isosorbide mononitrate 30 MG 24 hr tablet ?Commonly known as: IMDUR ?Take 30 mg by mouth daily. ?  ?linaclotide 145 MCG Caps capsule ?Commonly known as: LINZESS ?Take 145 mcg by mouth daily as needed (Constipation). ?  ?losartan 25 MG tablet ?Commonly known as: COZAAR ?Take 25 mg by mouth daily. ?  ?nitroGLYCERIN 0.4 MG SL tablet ?Commonly known as: NITROSTAT ?Place 1 tablet (0.4 mg total) under the tongue every 5 (five) minutes as needed for chest pain. ?  ?NON FORMULARY ?CPAP at bedtime ?  ?ondansetron 8 MG tablet ?Commonly known as: ZOFRAN ?Take 8 mg by mouth every 8 (eight) hours as needed for nausea or vomiting. ?  ?pantoprazole 40 MG tablet ?Commonly known as: PROTONIX ?Take 40 mg by mouth daily. ?  ?polyethylene glycol 17 g packet ?Commonly known as: MIRALAX / GLYCOLAX ?Take 17 g by mouth daily. ?What changed:  ?when to take this ?reasons to take this ?  ?sildenafil 20 MG tablet ?Commonly known as: REVATIO ?Take 60-100 mg by mouth daily as needed (  ED). ?  ?sotalol 80 MG tablet ?Commonly known as: BETAPACE ?Take 1 tablet (80 mg total) by mouth every 12 (twelve) hours. ?  ?SUMAtriptan 100 MG tablet ?Commonly known as: IMITREX ?Take 100 mg by mouth as directed. Take one tablet at onset, then an additional tablet every 12 hours as needed for migraine ?  ?traMADol 50 MG tablet ?Commonly known as: ULTRAM ?Take 50 mg by mouth every 6 (six) hours as needed for moderate pain. ?  ?triamterene-hydrochlorothiazide 37.5-25 MG capsule ?Commonly known as: DYAZIDE ?Take 1 each (1 capsule total) by mouth every morning. ?Start taking on: Aug 22, 2021 ?What changed: These instructions start on Aug 22, 2021. If you are unsure what to do until then, ask your doctor or other care provider. ?  ? ?  ? ? ?Discharge Assessment: ?Vitals:  ? 08/19/21 0753 08/19/21 1223  ?BP: (!) 137/97 (!) 156/95  ?Pulse:    ?Resp: 17 17  ?Temp:  99.1 ?F (37.3 ?C)  ?SpO2:  100%  ? Skin clean, dry and intact without evidence of skin break down, no evidence of skin tears noted. ?IV catheter discontinued intact. Site without signs and symptoms of complications - no redness or edema noted at insertion site, patient denies c/o pain - only slight tenderness at site.  Dressing with slight pressure applied. ? ?D/c Instructions-Education: ?Discharge instructions given to patient/family with verbalized understanding. ?D/c education completed with patient/family including follow up instructions, medication list, d/c activities limitations if indicated, with other d/c instructions as indicated by MD - patient able to verbalize understanding, all questions fully answered. ?Patient instructed to return to ED, call 911, or call MD for any changes in condition.  ?Patient escorted via [place], and D/C home via private auto. ? ?Modest Draeger, Jolene Schimke, RN ?08/19/2021 2:30 PM  ?

## 2021-08-19 NOTE — Discharge Summary (Signed)
?Physician Discharge Summary ?  ?Patient: [name] MRN: [id number] DOB: 1960/12/30  ?Admit date:     08/16/2021  ?Discharge date: 08/19/21  ?Discharge Physician: Cordelia Poche, MD  ? ?PCP: [name], MD  ? ?Recommendations at discharge:  ? ?PCP follow-up; repeat BMP ?Neurology follow-up ? ?Discharge Diagnoses: ?Principal Problem: ?  CNS infection ?Active Problems: ?  Abnormal brain MRI ?  S/P cervical spinal fusion - C3-6 ?  HTN (hypertension) ?  Stage 3a chronic kidney disease (CKD) ([place]) ?  PAF (paroxysmal atrial fibrillation) ([place]) ?  COPD (chronic obstructive pulmonary disease) ([place]) ?  Constipation ? ?Resolved Problems: ?  Acute metabolic encephalopathy ?  AKI (acute kidney injury) ([place]) ? ?Hospital Course: ?[name] is a 61 y.o. male with a history of hypertension, paroxysmal atrial fibrillation, CKD stage III, history of cervical spine radiculopathy status post cervical fusion 3 through 6 on Aug 10, 2021 who presented secondary to altered mental status. Initial concern for stroke. Neurology consulted. Code stroke called with imaging not suggestive of acute stroke, but MRI significant for a 1 cm ring-like lesion suggestive of possible infectious etiology. LP ordered but unable to complete. Mental status significantly improved day after admission. Patient is on empiric antibiotics. ? ?Assessment and Plan: ?* Acute metabolic encephalopathy-resolved as of 08/18/2021 ?Resolved. See problem, Abnormal brain MRI. ? ?Abnormal brain MRI ?Initial MRI brain significant for a 1 cm ring-like lesion concerning for local infection vs meningitis. Neurology consulted. Patient started empirically on ampicillin, Ceftriaxone and Vancomycin. Acyclovir was not started. Associated encephalopathy resolved. LP attempted by EDP and IR but were unsuccessful. Neurology recommend MRI w/wo contrast head and c-spine and ID consult. Repeat MRI not consistent with infection but indicates likely encephalomalacia. Antibiotics  discontinued. Neurology recommendations for out patient follow-up. ? ?S/P cervical spinal fusion - C3-6 ?Noted. Currently in cervical collar. ? ?COPD (chronic obstructive pulmonary disease) ([place]) ?Stable. On RA. ? ?PAF (paroxysmal atrial fibrillation) ([place]) ?Currently in normal sinus rhythm. On Eliquis as an outpatient which is held currently. Patient is also managed on sotalol. Resume home regimen on discharge. ? ?Stage 3a chronic kidney disease (CKD) ([place]) ?Stable. ? ?HTN (hypertension) ?Patient is on amlodipine, Imdur, hydrochlorothiazide and triamterene as an outpatient. Started on nitroglycerin patch on admission. Patch discontinued and home Imdur restarted. Home amlodipine restarted as well. Continue home regimen on discharge, but have recommended to restart hydrochlorothiazide-triamterene in a few days. ? ?Constipation ?Possibly opiate induced, although patient has a chronic history. Good bowel sounds. No nausea/emesis. SMOG enema administer with positive result. ? ?AKI (acute kidney injury) ([facility])-resolved as of 08/18/2021 ?Baseline creatinine of about 1.3. Creatinine of 1.74 on admission with peak of 1.8. Now back to baseline. ? ? ? ?  ? ? ?Consultants: Neurology, Infectious disease, Neurosurgery ?Procedures performed: None  ?Disposition: Home ?Diet recommendation:  ?Cardiac diet ? ?DISCHARGE MEDICATION: ?Allergies as of 08/19/2021   ?No Known Allergies ?  ? ?  ?Medication List  ?  ? ?STOP taking these medications   ? ?diclofenac 75 MG EC tablet ?Commonly known as: VOLTAREN ?  ? ?  ? ?TAKE these medications   ? ?acetaminophen 325 MG tablet ?Commonly known as: TYLENOL ?Take 650 mg by mouth every 6 (six) hours as needed for moderate pain. ?  ?amLODipine 5 MG tablet ?Commonly known as: NORVASC ?Take 1 tablet (5 mg total) by mouth daily. ?  ?apixaban 5 MG Tabs tablet ?Commonly known as: Eliquis ?Take 1 tablet (5 mg total) by mouth 2 (two)  times daily. Take Eliquis 5 mg twice a day one week after GI bleed.  Do not  restart Xarelto. ?What changed: additional instructions ?  ?cetirizine 10 MG tablet ?Commonly known as: ZYRTEC ?Take 10 mg by mouth daily as needed for allergies. ?  ?Colchicine 0.6 MG Caps ?Take 0.6 mg by mouth 2 (two) times daily as needed (gout flare). ?  ?cyclobenzaprine 10 MG tablet ?Commonly known as: FLEXERIL ?Take 1 tablet (10 mg total) by mouth 3 (three) times daily as needed for muscle spasms. ?  ?Emgality 120 MG/ML Soaj ?Generic drug: Galcanezumab-gnlm ?Inject 120 mg into the skin every 30 (thirty) days. ?  ?famotidine 20 MG tablet ?Commonly known as: PEPCID ?Take 40 mg by mouth daily as needed for heartburn or indigestion. ?  ?ferrous sulfate 325 (65 FE) MG tablet ?Take 1 tablet (325 mg total) by mouth daily with breakfast. ?  ?fluticasone 50 MCG/ACT nasal spray ?Commonly known as: FLONASE ?Place 1 spray into both nostrils daily as needed for allergies or rhinitis. ?  ?Fluticasone-Salmeterol 250-50 MCG/DOSE Aepb ?Commonly known as: ADVAIR ?Inhale 1 puff into the lungs 2 (two) times daily as needed (wheezing). ?  ?gabapentin 300 MG capsule ?Commonly known as: Neurontin ?Take 1 capsule (300 mg total) by mouth 4 (four) times daily. For nerve pain ?What changed: additional instructions ?  ?guaiFENesin 600 MG 12 hr tablet ?Commonly known as: [place] ?Take 600 mg by mouth daily as needed for to loosen phlegm. ?  ?HYDROcodone-acetaminophen 5-325 MG tablet ?Commonly known as: NORCO/VICODIN ?Take 1-2 tablets by mouth every 4 (four) hours as needed for moderate pain. ?  ?isosorbide mononitrate 30 MG 24 hr tablet ?Commonly known as: IMDUR ?Take 30 mg by mouth daily. ?  ?linaclotide 145 MCG Caps capsule ?Commonly known as: LINZESS ?Take 145 mcg by mouth daily as needed (Constipation). ?  ?losartan 25 MG tablet ?Commonly known as: COZAAR ?Take 25 mg by mouth daily. ?  ?nitroGLYCERIN 0.4 MG SL tablet ?Commonly known as: NITROSTAT ?Place 1 tablet (0.4 mg total) under the tongue every 5 (five) minutes as needed for  chest pain. ?  ?NON FORMULARY ?CPAP at bedtime ?  ?ondansetron 8 MG tablet ?Commonly known as: ZOFRAN ?Take 8 mg by mouth every 8 (eight) hours as needed for nausea or vomiting. ?  ?pantoprazole 40 MG tablet ?Commonly known as: PROTONIX ?Take 40 mg by mouth daily. ?  ?polyethylene glycol 17 g packet ?Commonly known as: MIRALAX / GLYCOLAX ?Take 17 g by mouth daily. ?What changed:  ?when to take this ?reasons to take this ?  ?sildenafil 20 MG tablet ?Commonly known as: REVATIO ?Take 60-100 mg by mouth daily as needed (ED). ?  ?sotalol 80 MG tablet ?Commonly known as: BETAPACE ?Take 1 tablet (80 mg total) by mouth every 12 (twelve) hours. ?  ?SUMAtriptan 100 MG tablet ?Commonly known as: IMITREX ?Take 100 mg by mouth as directed. Take one tablet at onset, then an additional tablet every 12 hours as needed for migraine ?  ?traMADol 50 MG tablet ?Commonly known as: ULTRAM ?Take 50 mg by mouth every 6 (six) hours as needed for moderate pain. ?  ?triamterene-hydrochlorothiazide 37.5-25 MG capsule ?Commonly known as: DYAZIDE ?Take 1 each (1 capsule total) by mouth every morning. ?Start taking on: Aug 22, 2021 ?What changed: These instructions start on Aug 22, 2021. If you are unsure what to do until then, ask your doctor or other care provider. ?  ? ?  ? ? Follow-up Information   ? ?  [name], MD. Schedule an appointment as soon as possible for a visit in 1 week(s).   ?Specialty: Family Medicine ?Why: For hospital follow-up ?Contact information: ?[address] ?[address] ?[place] [address] ?[phone] ? ? ?  ?  ? ? [facility]. Schedule an appointment as soon as possible for a visit.   ?Specialty: Neurology ?Contact information: ?[place] ?[phone] ? ?  ?  ? ?  ?  ? ?  ? ?Discharge Exam: ?BP (!) 137/97 (BP Location: Right Arm)   Pulse 64   Temp 99.2 ?F (37.3 ?C) (Oral)   Resp 17   Ht 6' (1.829 m)   Wt 94.4 kg   SpO2 99%   BMI  28.22 kg/m?  ? ?General exam: Appears calm and comfortable ?Respiratory system: Clear to auscultation. Respiratory effort normal. ?Cardiovascular system: S1 & S2 heard, RRR. No murmurs, rubs, gallops or clicks. ?

## 2021-08-19 NOTE — Plan of Care (Signed)
?  Problem: Health Behavior/Discharge Planning: ?Goal: Ability to manage health-related needs will improve ?Outcome: Adequate for Discharge ? Patient discharged to go home with no barriers DC at this time. ?

## 2021-08-22 LAB — CULTURE, BLOOD (ROUTINE X 2)
Culture: NO GROWTH
Culture: NO GROWTH
Special Requests: ADEQUATE

## 2021-08-28 ENCOUNTER — Ambulatory Visit: Payer: Medicare Other | Admitting: Neurology

## 2021-08-28 ENCOUNTER — Telehealth: Payer: Self-pay | Admitting: Neurology

## 2021-08-28 ENCOUNTER — Encounter: Payer: Self-pay | Admitting: Neurology

## 2021-08-28 VITALS — BP 127/88 | HR 75 | Ht 71.0 in | Wt 206.0 lb

## 2021-08-28 DIAGNOSIS — M5416 Radiculopathy, lumbar region: Secondary | ICD-10-CM | POA: Diagnosis not present

## 2021-08-28 DIAGNOSIS — Z9889 Other specified postprocedural states: Secondary | ICD-10-CM | POA: Diagnosis not present

## 2021-08-28 MED ORDER — OXCARBAZEPINE 150 MG PO TABS: 300.0000 mg | ORAL_TABLET | Freq: Two times a day (BID) | ORAL | 6 refills | Status: DC

## 2021-08-28 NOTE — Progress Notes (Signed)
Chief Complaint  Patient presents with   New Patient (Initial Visit)    Rm 14. Alone. NP ED referral for AMS.      ASSESSMENT AND PLAN  [name] is a 61 y.o. male   History of cervical decompression on Aug 10, 2021 Acute onset of radiating pain from left buttock to left lower extremity, Known history of lumbar degenerative disease  Differential diagnosis including left lumbar radiculopathy  Trileptal 150 mg titrating to 300 mg twice a day, may combine with gabapentin  MRI lumbar spine    DIAGNOSTIC DATA (LABS, IMAGING, TESTING) - I reviewed patient records, labs, notes, testing and imaging myself where available.  Laboratory evaluations Aug 19, 2021, Vancomyocin rough level was 14, potassium 3.3, creatinine 1.44, calcium 8.7,  Blood culture negative twice, CBC, hemoglobin 12.5, negative alcohol, UDS was positive for opiates, MEDICAL HISTORY:  [name] is a 61 year-old male, seen in request by his primary care physician Dr. Darron Doom, Santiago Glad for evaluation of left low back pain, radiating pain into left lower extremity, initial evaluation was on Aug 28 2021  I reviewed and summarized the referring note.  Past medical history Hypertension Paroxysmal atrial fibrillation Chronic kidney disease History of cervical radiculopathy status post cervical decompression on Aug 10, 2021,  Patient has a history of cervical radiculopathy, presenting with worsening neck pain, radiating pain to bilateral upper extremity, right more than left, mainly involving bilateral fourth and fifth fingers, he eventually underwent C3-4, C4-5, C5-6 anterior cervical discectomy with interbody fusion on Aug 10 2021.  Surgery did help his neck pain, left arm numbness and weakness, he was readmitted to hospital on Aug 16, 2021 for acute onset of confusion, his sister was there from out of town helping care for him after surgery, then he was found to be confused  He is on polypharmacy for his pain,  this including Norco 5/325 mg 5 to 6 tablets a day, tramadol 50 mg twice a day, gabapentin 300 mg 4 times a day, also has severe constipation  Initial CT head CT angiogram of head and neck was negative Laboratory MRI of brain Aug 17, 2021, there was 1 cm ringlike lesion with adjacent subtle leptomeningeal signal involving the left occipital lobe, worried about the possibility of postoperative infection, he had fluoroscopy guided lumbar puncture on Aug 17, 2021, multiple attempts at L3-4, L2-3, L1-2 without success  Patient reported that during lumbar puncture, he noticed sharp radiating pain to his left buttock, lateral thigh and calf region, which has persisted even now, persistent low degree of pain, intermittent very sharp radiating pain,    PHYSICAL EXAM:   Vitals:   08/28/21 1309  BP: 127/88  Pulse: 75  Weight: 206 lb (93.4 kg)  Height: _0  (1.803 m)   Not recorded     Body mass index is 28.73 kg/m.  PHYSICAL EXAMNIATION:  Gen: NAD, conversant, well nourised, well groomed                     Cardiovascular: Regular rate rhythm, no peripheral edema, warm, nontender. Eyes: Conjunctivae clear without exudates or hemorrhage Neck: No carotid bruits, limited range of motion of neck Pulmonary: Clear to auscultation bilaterally   NEUROLOGICAL EXAM:  MENTAL STATUS: Mild distress, uncomfortable in sitting position, pacing around, Speech/cognition: Awake, alert, oriented to history taking and casual conversation CRANIAL NERVES: CN II: Visual fields are full to confrontation. Pupils are round equal and briskly reactive to light. CN III, IV,  VI: extraocular movement are normal. No ptosis. CN V: Facial sensation is intact to light touch CN VII: Face is symmetric with normal eye closure  CN VIII: Hearing is normal to causal conversation. CN IX, X: Phonation is normal. CN XI: Head turning and shoulder shrug are intact  MOTOR: No limb muscle weakness  REFLEXES: Reflexes are  2+ and symmetric at the biceps, triceps, knees, and ankles. Plantar responses are flexor.  SENSORY: Intact to light touch, pinprick and vibratory sensation are intact in fingers and toes.  COORDINATION: There is no trunk or limb dysmetria noted.  GAIT/STANCE: He needs push-up to get up from sitting position, antalgic, cautious  REVIEW OF SYSTEMS:  Full 14 system review of systems performed and notable only for as above All other review of systems were negative.   ALLERGIES: No Known Allergies  HOME MEDICATIONS: Current Outpatient Medications  Medication Sig Dispense Refill   acetaminophen (TYLENOL) 325 MG tablet Take 650 mg by mouth every 6 (six) hours as needed for moderate pain.     amLODipine (NORVASC) 5 MG tablet Take 1 tablet (5 mg total) by mouth daily. 30 tablet 0   apixaban (ELIQUIS) 5 MG TABS tablet Take 1 tablet (5 mg total) by mouth 2 (two) times daily. Take Eliquis 5 mg twice a day one week after GI bleed.  Do not restart Xarelto. (Patient taking differently: Take 5 mg by mouth 2 (two) times daily.) 60 tablet 0   cetirizine (ZYRTEC) 10 MG tablet Take 10 mg by mouth daily as needed for allergies.      Colchicine 0.6 MG CAPS Take 0.6 mg by mouth 2 (two) times daily as needed (gout flare).     cyclobenzaprine (FLEXERIL) 10 MG tablet Take 1 tablet (10 mg total) by mouth 3 (three) times daily as needed for muscle spasms. 30 tablet 0   EMGALITY 120 MG/ML SOAJ Inject 120 mg into the skin every 30 (thirty) days.     famotidine (PEPCID) 20 MG tablet Take 40 mg by mouth daily as needed for heartburn or indigestion.     fluticasone (FLONASE) 50 MCG/ACT nasal spray Place 1 spray into both nostrils daily as needed for allergies or rhinitis.     Fluticasone-Salmeterol (ADVAIR) 250-50 MCG/DOSE AEPB Inhale 1 puff into the lungs 2 (two) times daily as needed (wheezing).     gabapentin (NEURONTIN) 300 MG capsule Take 1 capsule (300 mg total) by mouth 4 (four) times daily. For nerve pain  (Patient taking differently: Take 300 mg by mouth 4 (four) times daily.) 120 capsule 5   guaiFENesin (MUCINEX) 600 MG 12 hr tablet Take 600 mg by mouth daily as needed for to loosen phlegm.     HYDROcodone-acetaminophen (NORCO/VICODIN) 5-325 MG tablet Take 1-2 tablets by mouth every 4 (four) hours as needed for moderate pain. 20 tablet 0   isosorbide mononitrate (IMDUR) 30 MG 24 hr tablet Take 30 mg by mouth daily.     linaclotide (LINZESS) 145 MCG CAPS capsule Take 145 mcg by mouth daily as needed (Constipation).     losartan (COZAAR) 25 MG tablet Take 25 mg by mouth daily.     nitroGLYCERIN (NITROSTAT) 0.4 MG SL tablet Place 1 tablet (0.4 mg total) under the tongue every 5 (five) minutes as needed for chest pain. 20 tablet 1   NON FORMULARY CPAP at bedtime     ondansetron (ZOFRAN) 8 MG tablet Take 8 mg by mouth every 8 (eight) hours as needed for nausea or vomiting.  pantoprazole (PROTONIX) 40 MG tablet Take 40 mg by mouth daily.     polyethylene glycol (MIRALAX / GLYCOLAX) 17 g packet Take 17 g by mouth daily. (Patient taking differently: Take 17 g by mouth daily as needed for mild constipation.) 14 each 0   sildenafil (REVATIO) 20 MG tablet Take 60-100 mg by mouth daily as needed (ED).     SUMAtriptan (IMITREX) 100 MG tablet Take 100 mg by mouth as directed. Take one tablet at onset, then an additional tablet every 12 hours as needed for migraine     traMADol (ULTRAM) 50 MG tablet Take 50 mg by mouth every 6 (six) hours as needed for moderate pain.     triamterene-hydrochlorothiazide (DYAZIDE) 37.5-25 MG capsule Take 1 each (1 capsule total) by mouth every morning.     ferrous sulfate 325 (65 FE) MG tablet Take 1 tablet (325 mg total) by mouth daily with breakfast. 60 tablet 0   sotalol (BETAPACE) 80 MG tablet Take 1 tablet (80 mg total) by mouth every 12 (twelve) hours. 60 tablet 0   No current facility-administered medications for this visit.    PAST MEDICAL HISTORY: Past Medical  History:  Diagnosis Date   A-fib [facility])    Chest pain    a. 11/2002 Cath: EF 68%, LM nl, LAD small, nl, LCX nl, RCA tortuous/nl;  b. 08/2011 St Echo: EF 60%, normal contractility, no scar/ischemia.   COPD (chronic obstructive pulmonary disease) (HCC)    Dysrhythmia    Enlarged prostate    Gastrointestinal hemorrhage 08/25/2018   GERD (gastroesophageal reflux disease)    GI bleeding 08/2018   Hypertension    Sickle cell trait (HCC)    Sleep apnea    SVT (supraventricular tachycardia) ([place])     PAST SURGICAL HISTORY: Past Surgical History:  Procedure Laterality Date   ANTERIOR CERVICAL DECOMP/DISCECTOMY FUSION N/A 08/10/2021   Procedure: CERVICAL THREE-FOUR, CERVICAL FOUR-FIVE, CERVICAL FIVE-SIX ANTERIOR CERVICAL DECOMPRESSION/DISCECTOMY FUSION;  Surgeon: Earnie Larsson, MD;  Location: [place];  Service: Neurosurgery;  Laterality: N/A;  3C/RM 20   ATRIAL FIBRILLATION ABLATION     BIOPSY  08/27/2018   Procedure: BIOPSY;  Surgeon: Thornton Park, MD;  Location: [place];  Service: Gastroenterology;;   BIOPSY  08/28/2018   Procedure: BIOPSY;  Surgeon: Thornton Park, MD;  Location: [place];  Service: Gastroenterology;;   COLONOSCOPY WITH PROPOFOL N/A 08/28/2018   Procedure: COLONOSCOPY WITH PROPOFOL;  Surgeon: Thornton Park, MD;  Location: [place];  Service: Gastroenterology;  Laterality: N/A;   ENTEROSCOPY N/A 08/27/2018   Procedure: ENTEROSCOPY;  Surgeon: Thornton Park, MD;  Location: [place];  Service: Gastroenterology;  Laterality: N/A;   GIVENS CAPSULE STUDY N/A 08/28/2018   Procedure: GIVENS CAPSULE STUDY;  Surgeon: Thornton Park, MD;  Location: [place];  Service: Gastroenterology;  Laterality: N/A;   LEFT HEART CATHETERIZATION WITH CORONARY ANGIOGRAM N/A 03/11/2013   Procedure: LEFT HEART CATHETERIZATION WITH CORONARY ANGIOGRAM;  Surgeon: Jolaine Artist, MD;  Location[facility] C[facility] LAB;  Service: Cardiovascular;  Laterality: N/A;   POLYPECTOMY  08/28/2018    Procedure: POLYPECTOMY;  Surgeon: Thornton Park, MD;  Location: [facility] ENDOSCOPY;  Service: Gastroenterology;;   TONSILLECTOMY     TRANSURETHRAL RESECTION OF PROSTATE      FAMILY HISTORY: Family History  Problem Relation Age of Onset   CAD Mother        died in her 77's - MI   Multiple myeloma Brother    Sickle cell anemia Father  died @ 51   Gastric cancer Neg Hx     SOCIAL HISTORY: Social History   Socioeconomic History   Marital status: Divorced    Spouse name: Not on file   Number of children: Not on file   Years of education: Not on file   Highest education level: Not on file  Occupational History   Not on file  Tobacco Use   Smoking status: Never   Smokeless tobacco: Never  Vaping Use   Vaping Use: Never used  Substance and Sexual Activity   Alcohol use: No   Drug use: Never   Sexual activity: Not on file  Other Topics Concern   Not on file  Social History Narrative   Lives in [place] with dtr.   Social Determinants of Health   Financial Resource Strain: Not on file  Food Insecurity: Not on file  Transportation Needs: Not on file  Physical Activity: Not on file  Stress: Not on file  Social Connections: Not on file  Intimate Partner Violence: Not on file      Marcial Pacas, M.D. Ph.D.  [facility] [address] [place], [place] [address] Ph: [phone] Fax: [phone]  CC:  [name], MD [address] [place],  [place] [address]  [name], MD

## 2021-08-28 NOTE — Telephone Encounter (Signed)
[employer] medicare NPR sent to GI they will call the patient to schedule 

## 2021-08-30 ENCOUNTER — Other Ambulatory Visit: Payer: Medicare Other

## 2021-08-30 ENCOUNTER — Ambulatory Visit: Payer: Self-pay | Admitting: Neurology

## 2021-09-04 ENCOUNTER — Ambulatory Visit
Admission: RE | Admit: 2021-09-04 | Discharge: 2021-09-04 | Disposition: A | Discharge status: Home / Self Care | Payer: Medicare Other | Source: Ambulatory Visit | Attending: Neurology | Admitting: Neurology

## 2021-09-04 ENCOUNTER — Ambulatory Visit: Payer: Self-pay | Admitting: Neurology

## 2021-09-04 DIAGNOSIS — M5416 Radiculopathy, lumbar region: Secondary | ICD-10-CM | POA: Diagnosis not present

## 2021-09-04 DIAGNOSIS — Z9889 Other specified postprocedural states: Secondary | ICD-10-CM

## 2021-09-05 ENCOUNTER — Other Ambulatory Visit: Payer: Medicare Other

## 2021-09-06 ENCOUNTER — Telehealth: Payer: Self-pay | Admitting: Neurology

## 2021-09-06 DIAGNOSIS — M5416 Radiculopathy, lumbar region: Secondary | ICD-10-CM

## 2021-09-06 MED ORDER — TRAMADOL HCL 50 MG PO TABS
50.0000 mg | ORAL_TABLET | Freq: Two times a day (BID) | ORAL | 0 refills | Status: DC | PRN
Start: 2021-09-06 — End: 2021-09-20

## 2021-09-06 NOTE — Telephone Encounter (Signed)
Meds ordered this encounter  Medications   traMADol (ULTRAM) 50 MG tablet    Sig: Take 1 tablet (50 mg total) by mouth every 12 (twelve) hours as needed.    Dispense:  30 tablet    Refill:  0

## 2021-09-06 NOTE — Telephone Encounter (Signed)
Please call patient, MRI of lumbar spine showed evidence of moderate to severe spinal stenosis at L4-5, with evidence of foraminal stenosis  If he still have significant low back pain, I will see him in EMG nerve conduction study and cancel appointment with Jinny Blossom, but if his current low back pain is under better control now with higher dose of medications, will keep follow-up with Megan    MRI lumbar spine (without) demonstrating: - At L4-5: Pseudo disc bulging, facet hypertrophy with moderate to severe spinal stenosis and moderate to severe bilateral foraminal stenosis. - At L1-2: Short pedicles, facet hypertrophy resulting in mild spinal stenosis and moderate bilateral foraminal stenosis. - Additional degenerative changes and foraminal stenosis as above. - Compared to MRI from 05/22/2015, mild progression of degenerative changes, spinal stenosis and foraminal stenosis. - Incidentally noted, left renal cyst measuring 1.5 cm, slightly larger than comparison CT abdomen pelvis from 04/23/2019.

## 2021-09-06 NOTE — Telephone Encounter (Signed)
I called the patient back to let him know Tramadol had been sent to the pharmacy.

## 2021-09-06 NOTE — Addendum Note (Signed)
Addended by: Marcial Pacas on: 09/06/2021 05:04 PM   Modules accepted: Orders

## 2021-09-06 NOTE — Telephone Encounter (Signed)
Called pt. Relayed results per Dr. Rhea Belton note. He verbalized understanding. He is still in significant pain. Scheduled EMG/NCS for 10/10/21 at 9:30am with Dr. Krista Blue. Cx appt w/ Megan. Added to wait list.  He is still taking Trileptal '150mg'$ , 2 tabs po BID and gabapentin '300mg'$  QID. Out of tramadol/hydrocodone (rx'd by PCP). Wanting to know if Dr. Krista Blue will call in pain meds for him to take. Informed him Dr. Krista Blue does not prescribe pain meds but will see what she can offer and we will call back.

## 2021-09-07 ENCOUNTER — Encounter
Payer: Medicare Other | Attending: Physical Medicine and Rehabilitation | Admitting: Physical Medicine and Rehabilitation

## 2021-09-07 ENCOUNTER — Encounter: Payer: Self-pay | Admitting: Physical Medicine and Rehabilitation

## 2021-09-07 VITALS — BP 133/90 | HR 73 | Ht 71.0 in | Wt 207.4 lb

## 2021-09-07 DIAGNOSIS — Z79891 Long term (current) use of opiate analgesic: Secondary | ICD-10-CM | POA: Insufficient documentation

## 2021-09-07 DIAGNOSIS — M7918 Myalgia, other site: Secondary | ICD-10-CM | POA: Diagnosis present

## 2021-09-07 DIAGNOSIS — G894 Chronic pain syndrome: Secondary | ICD-10-CM | POA: Insufficient documentation

## 2021-09-07 DIAGNOSIS — N1831 Chronic kidney disease, stage 3a: Secondary | ICD-10-CM | POA: Insufficient documentation

## 2021-09-07 DIAGNOSIS — M5416 Radiculopathy, lumbar region: Secondary | ICD-10-CM | POA: Diagnosis present

## 2021-09-07 DIAGNOSIS — Z5181 Encounter for therapeutic drug level monitoring: Secondary | ICD-10-CM | POA: Diagnosis present

## 2021-09-07 DIAGNOSIS — Z981 Arthrodesis status: Secondary | ICD-10-CM | POA: Diagnosis present

## 2021-09-07 NOTE — Addendum Note (Signed)
Addended by: Jasmine December T on: 09/07/2021 01:58 PM   Modules accepted: Orders

## 2021-09-07 NOTE — Progress Notes (Signed)
Pt is a 61 yr old male with hx of SVT, interstitial lung disease, sickle cell syndrome, CKD- last Cr 1.50, Afib; migraine; Cervical and lumbar radiculopathy and OA of knee-as well as fibromyalgia.  Also has depression and HTN; chronic fatigue syndrome - on Eliquis, and gout. 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  Also needs L THR- leg will just give out.  Pt here for f/u on Cervical myelopathy.   He had neck surgery in May 2023- still having pain form neck into arms. Surgery was C3-C6 anterior cervical discectomy with fusion 08/10/21-   He saw Neurology- dx'd with L lumbar radiculopathy- known hx of DDD of lumbar spine Started on Trileptal- 300 mg BID and continue gabapentin- seen 08/28/21.  Due to post surgical confusion- where he was readmitted to hospital, they attempted Fluoro based LP- multiple itmes on 08/17/21- since then, having pain in LLE and L buttock since then.   Still pretty sore in arms and neck  Has to stand since they "hit a nerve" when they did lumbar puncture. And tried all 5 levels of lumbar spine and was unable to get into to do LP.   Michela Pitcher was hearing everything that was being said, but couldn't talk- when went to hospital with confusion.    Trileptal hasn't made any difference- taking 300 mg BID And can take Gabapentin-  Have only made him sleepy.    Just picked up tramadol this AM. Has taken 1 pill - got Rx from Dr Krista Blue yesterday.  Tramadol helped some- first time got relief was this AM.   Didn't have to sign contract with Dr Krista Blue.   Sitting is what causes the most pain-esp if sits too long- can get bad in as little as 2 minutes.  And walking- also sets it off.   Getting out trying to walk- LLE will feel like it's tingling- and pain in L buttocks will increase  Before LP, Gabapentin took the edge off. But didn't "fix" the pain.   L buttock pain is Burning. In crease of L buttock and radiates down into back of L calf.      Plan:  Patient here for trigger point injections for myofascial pain/migraines  Consent done and on chart.  Cleaned areas with alcohol and injected using a 27 gauge 1.5 inch needle  Injected 3cc Using 1% Lidocaine with no EPI  Upper traps B/L  Levators- B/L  Posterior scalenes Middle scalenes- B/L  Splenius Capitus- B/ L Pectoralis Major- don't need Rhomboids- B/L  Infraspinatus Teres Major/minor Thoracic paraspinals Lumbar paraspinals Other injections- R deltoid- lower down   Patient's level of pain prior was  Current level of pain after injections is  There was no bleeding or complications.  Patient was advised to drink a lot of water on day after injections to flush system Will have increased soreness for 12-48 hours after injections.  Can use Lidocaine patches the day AFTER injections Can use theracane on day of injections in places didn't inject Can use heating pad  (ice immediately)4-6 hours AFTER injections   2. Opiate contract and Urine drug screen. Today- has Rx for tramadol so not giving another one today.    3. Change Tramadol dose to 50 mg q6 hours as needed- Was prescribed for 2x/day ,but want him to take 4x/day as needed. Can take with tylenol and then take 2 tabs 2x/day.  4. Call me Tuesday/Wednesday AM- and let me know if Tramadol is working- if not, then will  change up the pain meds. Pt reports Oxy works great- but only took Quarry manager. In past. .   5. Con't gabapentin- won't change for now. Wants to function on pain meds.    6.  F/U in 2 months- wait list. And then q2 months for now  7. Try Trileptal for 2 more weeks- if doesn't hepl at all; just stops- it- noticed gabapentin works better.   I spent a total of 34   minutes on total care today- >50% coordination of care- due to 10 minute son injections- and rest discussing pain control

## 2021-09-07 NOTE — Patient Instructions (Addendum)
Plan:  Patient here for trigger point injections for myofascial pain/migraines  Consent done and on chart.  Cleaned areas with alcohol and injected using a 27 gauge 1.5 inch needle  Injected 3cc Using 1% Lidocaine with no EPI  Upper traps B/L  Levators- B/L  Posterior scalenes Middle scalenes- B/L  Splenius Capitus- B/ L Pectoralis Major- don't need Rhomboids- B/L  Infraspinatus Teres Major/minor Thoracic paraspinals Lumbar paraspinals Other injections- R deltoid- lower down   Patient's level of pain prior was  Current level of pain after injections is  There was no bleeding or complications.  Patient was advised to drink a lot of water on day after injections to flush system Will have increased soreness for 12-48 hours after injections.  Can use Lidocaine patches the day AFTER injections Can use theracane on day of injections in places didn't inject Can use heating pad  (ice immediately)4-6 hours AFTER injections   2. Opiate contract and Urine drug screen. Today- has Rx for tramadol so not giving another one today.    3. Change Tramadol dose to 50 mg q6 hours as needed- Was prescribed for 2x/day ,but want him to take 4x/day as needed. Can take with tylenol and then take 2 tabs 2x/day.  4. Call me Tuesday/Wednesday AM- and let me know if Tramadol is working- if not, then will change up the pain meds. Pt reports Oxy works great- but only took Quarry manager. In past. .   5. Con't gabapentin- won't change for now. Wants to function on pain meds.    6.  F/U in 2 months- wait list. And then q2 months for now  7. Try Trileptal for 2 more weeks- if doesn't hepl at all; just stops- it- noticed gabapentin works better.   8. Tennis ball- sit on IT! At least 5 minutes- have  it where it hurts on buttocks- can do up to 15-20 minutes/day max-

## 2021-09-11 ENCOUNTER — Telehealth: Payer: Self-pay

## 2021-09-11 NOTE — Telephone Encounter (Signed)
Had trigger point on 09/07/2021 and calling with an update Right side of neck and shoulder still pain. Pain level 8.

## 2021-09-12 LAB — TOXASSURE SELECT,+ANTIDEPR,UR

## 2021-09-14 ENCOUNTER — Emergency Department (HOSPITAL_BASED_OUTPATIENT_CLINIC_OR_DEPARTMENT_OTHER): Payer: Medicare Other

## 2021-09-14 ENCOUNTER — Emergency Department (HOSPITAL_BASED_OUTPATIENT_CLINIC_OR_DEPARTMENT_OTHER)
Admission: EM | Admit: 2021-09-14 | Discharge: 2021-09-15 | Disposition: A | Discharge status: Home / Self Care | Payer: Medicare Other | Attending: Emergency Medicine | Admitting: Emergency Medicine

## 2021-09-14 ENCOUNTER — Telehealth: Payer: Self-pay | Admitting: *Deleted

## 2021-09-14 ENCOUNTER — Other Ambulatory Visit: Payer: Self-pay

## 2021-09-14 ENCOUNTER — Encounter (HOSPITAL_BASED_OUTPATIENT_CLINIC_OR_DEPARTMENT_OTHER): Payer: Self-pay | Admitting: Emergency Medicine

## 2021-09-14 DIAGNOSIS — R109 Unspecified abdominal pain: Secondary | ICD-10-CM | POA: Insufficient documentation

## 2021-09-14 DIAGNOSIS — I4891 Unspecified atrial fibrillation: Secondary | ICD-10-CM | POA: Diagnosis not present

## 2021-09-14 DIAGNOSIS — K625 Hemorrhage of anus and rectum: Secondary | ICD-10-CM | POA: Diagnosis not present

## 2021-09-14 DIAGNOSIS — I129 Hypertensive chronic kidney disease with stage 1 through stage 4 chronic kidney disease, or unspecified chronic kidney disease: Secondary | ICD-10-CM | POA: Diagnosis not present

## 2021-09-14 DIAGNOSIS — Z7901 Long term (current) use of anticoagulants: Secondary | ICD-10-CM | POA: Diagnosis not present

## 2021-09-14 DIAGNOSIS — Z79899 Other long term (current) drug therapy: Secondary | ICD-10-CM | POA: Diagnosis not present

## 2021-09-14 DIAGNOSIS — K6289 Other specified diseases of anus and rectum: Secondary | ICD-10-CM

## 2021-09-14 DIAGNOSIS — N189 Chronic kidney disease, unspecified: Secondary | ICD-10-CM | POA: Diagnosis not present

## 2021-09-14 LAB — CBC WITH DIFFERENTIAL/PLATELET
Abs Immature Granulocytes: 0.02 10*3/uL (ref 0.00–0.07)
Basophils Absolute: 0 10*3/uL (ref 0.0–0.1)
Basophils Relative: 1 %
Eosinophils Absolute: 0.2 10*3/uL (ref 0.0–0.5)
Eosinophils Relative: 3 %
HCT: 40.1 % (ref 39.0–52.0)
Hemoglobin: 13.5 g/dL (ref 13.0–17.0)
Immature Granulocytes: 0 %
Lymphocytes Relative: 22 %
Lymphs Abs: 1.9 10*3/uL (ref 0.7–4.0)
MCH: 28.2 pg (ref 26.0–34.0)
MCHC: 33.7 g/dL (ref 30.0–36.0)
MCV: 83.9 fL (ref 80.0–100.0)
Monocytes Absolute: 0.8 10*3/uL (ref 0.1–1.0)
Monocytes Relative: 9 %
Neutro Abs: 5.7 10*3/uL (ref 1.7–7.7)
Neutrophils Relative %: 65 %
Platelets: 188 10*3/uL (ref 150–400)
RBC: 4.78 MIL/uL (ref 4.22–5.81)
RDW: 12.9 % (ref 11.5–15.5)
WBC: 8.6 10*3/uL (ref 4.0–10.5)
nRBC: 0 % (ref 0.0–0.2)

## 2021-09-14 LAB — BASIC METABOLIC PANEL
Anion gap: 5 (ref 5–15)
BUN: 16 mg/dL (ref 8–23)
CO2: 25 mmol/L (ref 22–32)
Calcium: 9.2 mg/dL (ref 8.9–10.3)
Chloride: 111 mmol/L (ref 98–111)
Creatinine, Ser: 1.35 mg/dL — ABNORMAL HIGH (ref 0.61–1.24)
GFR, Estimated: 59 mL/min — ABNORMAL LOW (ref >60)
Glucose, Bld: 119 mg/dL — ABNORMAL HIGH (ref 70–99)
Potassium: 3.6 mmol/L (ref 3.5–5.1)
Sodium: 141 mmol/L (ref 135–145)

## 2021-09-14 MED ORDER — MORPHINE SULFATE (PF) 4 MG/ML IV SOLN
4.0000 mg | Freq: Once | INTRAVENOUS | Status: AC
  Administered 2021-09-14: 4 mg via INTRAVENOUS
  Filled 2021-09-14: qty 1

## 2021-09-14 MED ORDER — IOHEXOL 300 MG/ML  SOLN
100.0000 mL | Freq: Once | INTRAMUSCULAR | Status: AC | PRN
  Administered 2021-09-14: 100 mL via INTRAVENOUS

## 2021-09-14 MED ORDER — LIDOCAINE HCL URETHRAL/MUCOSAL 2 % EX GEL
1.0000 "application " | Freq: Once | CUTANEOUS | Status: AC
  Administered 2021-09-14: 1
  Filled 2021-09-14: qty 11

## 2021-09-14 MED ORDER — ONDANSETRON HCL 4 MG/2ML IJ SOLN
4.0000 mg | Freq: Once | INTRAMUSCULAR | Status: AC
  Administered 2021-09-14: 4 mg via INTRAVENOUS
  Filled 2021-09-14: qty 2

## 2021-09-14 NOTE — ED Triage Notes (Signed)
Patient arrived via POV c/o rectal pain and bleeding. The pain started 2 weeks pta, with bleeding starting x 3 days. Patient states 10/10 pain. Patient unable to sit at this time. Patient is AO x 4, VS w elevated BP, slow gait.

## 2021-09-14 NOTE — ED Provider Notes (Signed)
[facility] Provider Note   CSN: [id number] Arrival date & time: 09/14/21  2053     History  Chief Complaint  Patient presents with   Rectal Bleeding    [name] is a 61 y.o. male.  Pt is a 60y/o male with hx of hypertension, paroxysmal atrial fibrillation on eliquis, CKD stage, history of cervical spine radiculopathy status post cervical fusion 3 through 6 on Aug 10, 2021 hospitalized at [facility] for altered mental status and concern for meningitis resolved after antibiotics and prior history of suspected diverticular bleed several years ago who is presenting today with rectal pain and bleeding.  Patient reports that even before leaving the hospital he was starting to have some pain in his rectum.  He reports initially he had an LP done and he had pain that went down the left leg and then went down the right leg but shortly after that time started noticing pain in the rectum.  It has been gradually worsening and it is not improved with gabapentin or tramadol.  He reports in the last few days he has started noticing some bright red blood on the toilet paper when he wipes but denies any blood in the stool.  He suffers from constipation and does take Linzess but still has hard stool.  Sitting, having bowel movements, palpating the rectum or using suppositories all makes the pain worse.  Sometimes when the pain is severe it radiates into his perineum but he denies any difficulty urinating.  He has no abdominal pain or vomiting.  He denies any fever.  He denies history of similar symptoms.  The history is provided by the patient and medical records.  Rectal Bleeding      Home Medications Prior to Admission medications   Medication Sig Start Date End Date Taking? Authorizing Provider  acetaminophen (TYLENOL) 325 MG tablet Take 650 mg by mouth every 6 (six) hours as needed for moderate pain.    [provider]  amLODipine (NORVASC) 5 MG tablet Take 1  tablet (5 mg total) by mouth daily. 04/30/19 08/28/21  Kayleen Memos, DO  apixaban (ELIQUIS) 5 MG TABS tablet Take 1 tablet (5 mg total) by mouth 2 (two) times daily. Take Eliquis 5 mg twice a day one week after GI bleed.  Do not restart Xarelto. Patient taking differently: Take 5 mg by mouth 2 (two) times daily. 05/06/19   Kayleen Memos, DO  cetirizine (ZYRTEC) 10 MG tablet Take 10 mg by mouth daily as needed for allergies.     [provider]  Colchicine 0.6 MG CAPS Take 0.6 mg by mouth 2 (two) times daily as needed (gout flare). 01/17/20   [provider]  cyclobenzaprine (FLEXERIL) 10 MG tablet Take 1 tablet (10 mg total) by mouth 3 (three) times daily as needed for muscle spasms. 08/11/21   Marvis Moeller, NP  EMGALITY 120 MG/ML SOAJ Inject 120 mg into the skin every 30 (thirty) days. 07/08/21   [provider]  famotidine (PEPCID) 20 MG tablet Take 40 mg by mouth daily as needed for heartburn or indigestion.    [provider]  ferrous sulfate 325 (65 FE) MG tablet Take 1 tablet (325 mg total) by mouth daily with breakfast. 04/30/19 08/16/21  Kayleen Memos, DO  fluticasone (FLONASE) 50 MCG/ACT nasal spray Place 1 spray into both nostrils daily as needed for allergies or rhinitis.    [provider]  Fluticasone-Salmeterol (ADVAIR) 250-50 MCG/DOSE AEPB  Inhale 1 puff into the lungs 2 (two) times daily as needed (wheezing).    [provider]  gabapentin (NEURONTIN) 300 MG capsule Take 1 capsule (300 mg total) by mouth 4 (four) times daily. For nerve pain Patient taking differently: Take 300 mg by mouth 4 (four) times daily. 07/09/21   Lovorn, Jinny Blossom, MD  guaiFENesin (MUCINEX) 600 MG 12 hr tablet Take 600 mg by mouth daily as needed for to loosen phlegm. 05/18/21   [provider]  HYDROcodone-acetaminophen (NORCO/VICODIN) 5-325 MG tablet Take 1-2 tablets by mouth every 4 (four) hours as needed for moderate pain. 08/11/21 08/11/22  Marvis Moeller, NP  isosorbide mononitrate (IMDUR) 30 MG 24 hr tablet Take 30 mg by mouth daily. 02/16/21   [provider]  linaclotide (LINZESS) 145 MCG CAPS capsule Take 145 mcg by mouth daily as needed (Constipation). 10/07/19   [provider]  losartan (COZAAR) 25 MG tablet Take 25 mg by mouth daily. 07/18/21   [provider]  nitroGLYCERIN (NITROSTAT) 0.4 MG SL tablet Place 1 tablet (0.4 mg total) under the tongue every 5 (five) minutes as needed for chest pain. 03/12/13   Thurnell Lose, MD  NON FORMULARY CPAP at bedtime    [provider]  ondansetron (ZOFRAN) 8 MG tablet Take 8 mg by mouth every 8 (eight) hours as needed for nausea or vomiting.  10/20/18   [provider]  OXcarbazepine (TRILEPTAL) 150 MG tablet Take 2 tablets (300 mg total) by mouth 2 (two) times daily. 08/28/21   Marcial Pacas, MD  pantoprazole (PROTONIX) 40 MG tablet Take 40 mg by mouth daily. 07/28/14   [provider]  polyethylene glycol (MIRALAX / GLYCOLAX) 17 g packet Take 17 g by mouth daily. Patient taking differently: Take 17 g by mouth daily as needed for mild constipation. 04/30/19   Kayleen Memos, DO  sildenafil (REVATIO) 20 MG tablet Take 60-100 mg by mouth daily as needed (ED). 11/25/18   [provider]  sotalol (BETAPACE) 80 MG tablet Take 1 tablet (80 mg total) by mouth every 12 (twelve) hours. 04/29/19 08/16/21  Kayleen Memos, DO  SUMAtriptan (IMITREX) 100 MG tablet Take 100 mg by mouth as directed. Take one tablet at onset, then an additional tablet every 12 hours as needed for migraine 02/04/20   [provider]  traMADol (ULTRAM) 50 MG tablet Take 50 mg by mouth every 6 (six) hours as needed for moderate pain.    [provider]  traMADol (ULTRAM) 50 MG tablet Take 1 tablet (50 mg total) by mouth every 12 (twelve) hours as needed. 09/06/21   Marcial Pacas, MD  triamterene-hydrochlorothiazide (DYAZIDE) 37.5-25 MG capsule Take 1 each (1  capsule total) by mouth every morning. 08/22/21   Mariel Aloe, MD      Allergies    Patient has no known allergies.    Review of Systems   Review of Systems  Gastrointestinal:  Positive for hematochezia.    Physical Exam Updated Vital Signs BP (!) 150/105 (BP Location: Left Arm)   Pulse 88   Temp 98.7 F (37.1 C)   Resp 18   Ht '5\' 11"'$  (1.803 m)   Wt 92 kg   SpO2 98%   BMI 28.29 kg/m  Physical Exam Vitals and nursing note reviewed.  Constitutional:      General: He is not in acute distress.    Appearance: He is well-developed.     Comments: Appears uncomfortable, leaning over  the bed and unable to sit  HENT:     Head: Normocephalic and atraumatic.  Eyes:     Conjunctiva/sclera: Conjunctivae normal.     Pupils: Pupils are equal, round, and reactive to light.  Cardiovascular:     Rate and Rhythm: Normal rate and regular rhythm.     Heart sounds: No murmur heard. Pulmonary:     Effort: Pulmonary effort is normal. No respiratory distress.     Breath sounds: Normal breath sounds. No wheezing or rales.  Abdominal:     General: There is no distension.     Palpations: Abdomen is soft.     Tenderness: There is no abdominal tenderness. There is no guarding or rebound.  Genitourinary:    Comments: No evidence of external hemorrhoids or fissures however when spreading the mucosal tissue bright red bleeding starts.  On digital rectal exam significant tenderness throughout the rectum with swollen tissue and pain Musculoskeletal:        General: No tenderness. Normal range of motion.     Cervical back: Normal range of motion and neck supple.  Skin:    General: Skin is warm and dry.     Findings: No erythema or rash.  Neurological:     Mental Status: He is alert and oriented to person, place, and time.  Psychiatric:        Behavior: Behavior normal.     ED Results / Procedures / Treatments   Labs (all labs ordered are listed, but only abnormal results are  displayed) Labs Reviewed  CBC WITH DIFFERENTIAL/PLATELET  BASIC METABOLIC PANEL  URINALYSIS, ROUTINE W REFLEX MICROSCOPIC    EKG None  Radiology No results found.  Procedures Procedures    Medications Ordered in ED Medications  morphine (PF) 4 MG/ML injection 4 mg (has no administration in time range)  ondansetron (ZOFRAN) injection 4 mg (has no administration in time range)  lidocaine (XYLOCAINE) 2 % jelly 1 application  (has no administration in time range)    ED Course/ Medical Decision Making/ A&P                           Medical Decision Making Amount and/or Complexity of Data Reviewed Labs: ordered. Radiology: ordered.  Risk Prescription drug management.   Pt with multiple medical problems and comorbidities and presenting today with a complaint that caries a high risk for morbidity and mortality.  Presenting today with complaints of rectal pain and bleeding.  Concern for hemorrhoids versus fissure versus mass versus proctitis.  On exam patient has nothing visible no evidence of thrombosed hemorrhoid.  However bright red blood present when starting to spread the mucosa.  Significant swelling noted on digital rectal exam and significant pain.  Patient given pain control.  Labs and imaging to rule out other acute causes.  No discrete firm mass palpated.         Final Clinical Impression(s) / ED Diagnoses Final diagnoses:  None    Rx / DC Orders ED Discharge Orders     None         Blanchie Dessert, MD 09/14/21 2326

## 2021-09-14 NOTE — Telephone Encounter (Signed)
Urine drug screen for this encounter is consistent for prescribed medication 

## 2021-09-15 MED ORDER — HYDROCORTISONE ACETATE 25 MG RE SUPP: 25.0000 mg | Freq: Two times a day (BID) | RECTAL | 0 refills | Status: DC

## 2021-09-17 ENCOUNTER — Telehealth: Payer: Self-pay | Admitting: *Deleted

## 2021-09-17 DIAGNOSIS — M48061 Spinal stenosis, lumbar region without neurogenic claudication: Secondary | ICD-10-CM

## 2021-09-17 MED ORDER — GABAPENTIN 300 MG PO CAPS: 900.0000 mg | ORAL_CAPSULE | Freq: Three times a day (TID) | ORAL | 6 refills | Status: DC

## 2021-09-17 NOTE — Telephone Encounter (Signed)
I spoke to the patient. Reports going to urgent care over the weekend. No hemorrhoids found. States he is having severe pain in left buttock, left leg radiating down to his first two toes. Also, numbness in thighs and calf. He stopped oxcarbazepine because he felt his pain was worse on this medication. He has a prescriptions for gabapentin '300mg'$  (from PCP) and Tramadol '50mg'$  (from Dr. Krista Blue) at home.  Yesterday, he took two caps of gabapentin, along with two tabs of Tramadol. Temporary got his pain down to 6 on the 0-10 scale. Off medications, rates it at 10. The extra doses over there prescribed amount are causing him sedation and he is sleeping too much.  He is seeing his PCP today concerning the rectal bleeding.   He is asking for a plan for pain management, at least until his nerve conduction on 10/10/21, when further information can be obtained.

## 2021-09-17 NOTE — Telephone Encounter (Addendum)
Note from Dr. Leta Baptist:  I called patient back. MRI lumbar show spinal stenosis. Also had er evaluation for rectal bleeding. Still with severe pain. Advised urgent care or er eval since pain is intractable and severe. May contact patient back on Monday.

## 2021-09-17 NOTE — Addendum Note (Signed)
Addended by: Marcial Pacas on: 09/17/2021 04:41 PM   Modules accepted: Orders

## 2021-09-17 NOTE — Addendum Note (Signed)
Addended by: Noberto Retort C on: 09/17/2021 04:55 PM   Modules accepted: Orders

## 2021-09-17 NOTE — Telephone Encounter (Addendum)
Patient spoke to on call MD over the weekend.

## 2021-09-17 NOTE — Telephone Encounter (Signed)
I left patient a detailed message with this plan (ok per DPR). Provided our number to call back with any questions.   I also called [facility] and left a voicemail on the provider line. Cancelled all other gabapentin prescriptions to avoid confusion.

## 2021-09-17 NOTE — Telephone Encounter (Signed)
Meds ordered this encounter  Medications   gabapentin (NEURONTIN) 300 MG capsule    Sig: Take 3 capsules (900 mg total) by mouth 3 (three) times daily.    Dispense:  270 capsule    Refill:  6  I have increased his gabapentin to much higher dose up to 300 mg 3 tablets 3 times a day,  He was given tramadol 50 mg 30 tablets on September 06, 2021  He should only take tramadol every night as needed to control pain and help him sleep,

## 2021-09-20 ENCOUNTER — Other Ambulatory Visit: Payer: Self-pay | Admitting: Neurology

## 2021-09-20 NOTE — Telephone Encounter (Signed)
Verify Drug Registry For Tramadol Hcl 50 Mg Tablet Last Filled: 09/06/2021 Quantity: 30 tablets for 15 days Last appointment: 08/29/2023 Next appointment: 10/10/2021

## 2021-09-20 NOTE — Telephone Encounter (Signed)
Meds ordered this encounter  Medications   traMADol (ULTRAM) 50 MG tablet    Sig: Take 1 tablet (50 mg total) by mouth every 12 (twelve) hours as needed.    Dispense:  60 tablet    Refill:  1    Please do not refill in less than 30 days

## 2021-09-25 ENCOUNTER — Ambulatory Visit: Payer: Medicare Other | Attending: Family Medicine

## 2021-09-25 DIAGNOSIS — M4322 Fusion of spine, cervical region: Secondary | ICD-10-CM | POA: Diagnosis present

## 2021-09-25 DIAGNOSIS — R5381 Other malaise: Secondary | ICD-10-CM | POA: Diagnosis present

## 2021-09-25 DIAGNOSIS — M5416 Radiculopathy, lumbar region: Secondary | ICD-10-CM | POA: Insufficient documentation

## 2021-09-25 NOTE — Telephone Encounter (Signed)
Pt said taking gabapentin (NEURONTIN) 300 MG capsule ,but still in pain. Combining traMADol (ULTRAM) 50 MG tablet and gabapentin (NEURONTIN) 300 MG capsule, but only tones down the pain. Would like a call from the nurse.

## 2021-09-25 NOTE — Telephone Encounter (Signed)
I spoke to the patient. States his pain is not tolerable. Radiating into left leg and foot with burning sensations. Medication only mildly relieves it for short periods of time. He is now taking gabapentin '300mg'$ , three capsules TID, along with Tramadol '50mg'$ , one tab TID (self-increased). He has already stopped the oxcarbazepine because he felt worse when taking it. NCV/EMG pending 10/10/21. Says he is "not sure what to do about the pain and really needs some help with it".

## 2021-09-25 NOTE — Addendum Note (Signed)
Addended by: Desmond Lope on: 09/25/2021 04:13 PM   Modules accepted: Orders

## 2021-09-25 NOTE — Therapy (Signed)
OUTPATIENT PHYSICAL THERAPY THORACOLUMBAR EVALUATION   Patient Name: SIDNEY KANN MRN: [id number] DOB:[birth year], 61 y.o., male Today's Date: 09/25/2021   PT End of Session - 09/25/21 1703     Visit Number 1    Number of Visits 8    Date for PT Re-Evaluation 10/23/21    Authorization Type [employer] MCR    PT Start Time 2841    PT Stop Time 1400    PT Time Calculation (min) 45 min    Activity Tolerance Patient tolerated treatment well    Behavior During Therapy WFL for tasks assessed/performed             Past Medical History:  Diagnosis Date   A-fib ([place])    Chest pain    a. 11/2002 Cath: EF 68%, LM nl, LAD small, nl, LCX nl, RCA tortuous/nl;  b. 08/2011 St Echo: EF 60%, normal contractility, no scar/ischemia.   COPD (chronic obstructive pulmonary disease) (HCC)    Dysrhythmia    Enlarged prostate    Gastrointestinal hemorrhage 08/25/2018   GERD (gastroesophageal reflux disease)    GI bleeding 08/2018   Hypertension    Sickle cell trait (HCC)    Sleep apnea    SVT (supraventricular tachycardia) ([place])    Past Surgical History:  Procedure Laterality Date   ANTERIOR CERVICAL DECOMP/DISCECTOMY FUSION N/A 08/10/2021   Procedure: CERVICAL THREE-FOUR, CERVICAL FOUR-FIVE, CERVICAL FIVE-SIX ANTERIOR CERVICAL DECOMPRESSION/DISCECTOMY FUSION;  Surgeon: Earnie Larsson, MD;  Location: [place];  Service: Neurosurgery;  Laterality: N/A;  3C/RM 20   ATRIAL FIBRILLATION ABLATION     BIOPSY  08/27/2018   Procedure: BIOPSY;  Surgeon: Thornton Park, MD;  Location: [place];  Service: Gastroenterology;;   BIOPSY  08/28/2018   Procedure: BIOPSY;  Surgeon: Thornton Park, MD;  Location: [place];  Service: Gastroenterology;;   COLONOSCOPY WITH PROPOFOL N/A 08/28/2018   Procedure: COLONOSCOPY WITH PROPOFOL;  Surgeon: Thornton Park, MD;  Location: [place];  Service: Gastroenterology;  Laterality: N/A;   ENTEROSCOPY N/A 08/27/2018   Procedure: ENTEROSCOPY;  Surgeon: Thornton Park, MD;  Location: [place];  Service: Gastroenterology;  Laterality: N/A;   GIVENS CAPSULE STUDY N/A 08/28/2018   Procedure: GIVENS CAPSULE STUDY;  Surgeon: Thornton Park, MD;  Location: [place];  Service: Gastroenterology;  Laterality: N/A;   LEFT HEART CATHETERIZATION WITH CORONARY ANGIOGRAM N/A 03/11/2013   Procedure: LEFT HEART CATHETERIZATION WITH CORONARY ANGIOGRAM;  Surgeon: Jolaine Artist, MD;  Location: [facility] CATH LAB;  Service: Cardiovascular;  Laterality: N/A;   POLYPECTOMY  08/28/2018   Procedure: POLYPECTOMY;  Surgeon: Thornton Park, MD;  Location: [facility] ENDOSCOPY;  Service: Gastroenterology;;   TONSILLECTOMY     TRANSURETHRAL RESECTION OF PROSTATE     Patient Active Problem List   Diagnosis Date Noted   Acute left lumbar radiculopathy 08/28/2021   H/O excision of lamina of cervical vertebra for decompression of spinal cord 08/28/2021   Constipation 08/18/2021   S/P cervical spinal fusion - C3-6 08/17/2021   Abnormal brain MRI    Cervical spondylosis with myelopathy and radiculopathy 08/10/2021   Cervical disc disorder with radiculopathy of cervical region 05/28/2021   Myofascial pain dysfunction syndrome 05/28/2021   Vitamin D deficiency 05/28/2021   Unstable angina ([place]) 08/18/2020   Restrictive lung disease 05/17/2020   Pulmonary HTN ([place]) 03/06/2020   Ventricular arrhythmia 06/21/2019   AVM (arteriovenous malformation) of small bowel, acquired with hemorrhage 06/16/2019   COPD (chronic obstructive pulmonary disease) ([place])    Anticoagulated    Cervical radiculopathy 03/08/2019  Gastrointestinal hemorrhage with melena 10/02/2018   Adenomatous polyp of transverse colon    Stage 3a chronic kidney disease (CKD) ([place]) 08/25/2018   PAF (paroxysmal atrial fibrillation) ([place]) 08/25/2018   Dizziness 03/30/2018   Referred otalgia of left ear 12/23/2017   Sensorineural hearing loss (SNHL) of both ears 12/23/2017   Other dysphagia 10/23/2016    Pericarditis, acute 10/17/2016   S/P ablation of atrial fibrillation 10/17/2016   Hematochezia 08/16/2016   Normocytic anemia 08/16/2016   Left ventricular hypertrophy 04/17/2016   History of colon polyps 02/05/2016   Gastroesophageal reflux disease without esophagitis 09/27/2015   Cough 08/04/2015   Cervicalgia 06/21/2015   Compression of left suprascapular nerve 06/21/2015   Memory loss 06/21/2015   Numbness and tingling in left arm 06/21/2015   Increased frequency of urination 04/25/2015   ED (erectile dysfunction) of organic origin 03/25/2015   OAB (overactive bladder) 08/20/2013   Obstructive sleep apnea (adult) (pediatric) 03/21/2013   HTN (hypertension) 03/10/2013   Nonspecific abnormal electrocardiogram (ECG) (EKG) 03/10/2013   Elevated PSA 11/06/2012   Bilateral inguinal hernia, without obstruction or gangrene, not specified as recurrent 08/28/2012   Multinodular goiter 02/25/2012   Prediabetes 05/05/2011   Mild persistent asthma without complication 09/73/5329   Mixed hyperlipidemia 05/06/2009   Nontoxic uninodular goiter 12/16/2007   Benign localized prostatic hyperplasia without lower urinary tract symptoms (LUTS) 09/15/2007   Impaired fasting glucose 06/11/2004    PCP: [name], MD  REFERRING PROVIDER: [name], MD  REFERRING DIAG: general debility  Rationale for Evaluation and Treatment Rehabilitation  THERAPY DIAG: physical deconditioning   ONSET DATE: 09/07/21(referral date)  SUBJECTIVE:                                                                                                                                                                                           SUBJECTIVE STATEMENT: Underwent cervical fusion 08/10/21, hospitalized 5/11 due to AMS,underwent spinal tap while hospitalized due to suspected menigitis and reports nerve irritation as a result leading to LLE radiculopathy(treated by Dr. Krista Blue).  Recently underwent multiple TP  injections by Dr. Alice Rieger.  Unable to sit due to LLE pain, awaiting NCV studies to be performed in July.  PERTINENT HISTORY:  Pt is a 61 yr old male with hx of SVT, interstitial lung disease, sickle cell syndrome, CKD- last Cr 1.50, Afib; migraine; Cervical and lumbar radiculopathy and OA of knee-as well as fibromyalgia.  Also has depression and HTN; chronic fatigue syndrome - on Eliquis, and gout. 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  Also needs L THR- leg will just give out.  Pt here  for f/u on Cervical myelopathy.    He had neck surgery in May 2023- still having pain form neck into arms. Surgery was C3-C6 anterior cervical discectomy with fusion 08/10/21-    He saw Neurology- dx'd with L lumbar radiculopathy- known hx of DDD of lumbar spine Started on Trileptal- 300 mg BID and continue gabapentin- seen 08/28/21.  Due to post surgical confusion- where he was readmitted to hospital, they attempted Fluoro based LP- multiple itmes on 08/17/21- since then, having pain in LLE and L buttock since then.    Still pretty sore in arms and neck   Has to stand since they "hit a nerve" when they did lumbar puncture. And tried all 5 levels of lumbar spine and was unable to get into to do LP.    Michela Pitcher was hearing everything that was being said, but couldn't talk- when went to hospital with confusion.      Trileptal hasn't made any difference- taking 300 mg BID And can take Gabapentin-  Have only made him sleepy.      Just picked up tramadol this AM. Has taken 1 pill - got Rx from Dr Krista Blue yesterday.  Tramadol helped some- first time got relief was this AM.    Didn't have to sign contract with Dr Krista Blue.    Sitting is what causes the most pain-esp if sits too long- can get bad in as little as 2 minutes.  And walking- also sets it off.    Getting out trying to walk- LLE will feel like it's tingling- and pain in L buttocks will increase   Before LP, Gabapentin took the edge  off. But didn't "fix" the pain.    L buttock pain is Burning. In crease of L buttock and radiates down into back of L calf.   PAIN:  Are you having pain? Yes: NPRS scale: 10/10 Pain location: neck and LLE Pain description: Ache Aggravating factors: activity Relieving factors: lying down   PRECAUTIONS: Fall  WEIGHT BEARING RESTRICTIONS No  FALLS:  Has patient fallen in last 6 months? No  LIVING ENVIRONMENT: Lives with: lives with their family Lives in: House/apartment Stairs:  yes Has following equipment at home: None  OCCUPATION: not working currently  PLOF: Independent  PATIENT GOALS To reduce and manage my symptoms    OBJECTIVE:   DIAGNOSTIC FINDINGS:    IMPRESSION:    MRI lumbar spine (without) demonstrating: - At L4-5: Pseudo disc bulging, facet hypertrophy with moderate to severe spinal stenosis and moderate to severe bilateral foraminal stenosis. - At L1-2: Short pedicles, facet hypertrophy resulting in mild spinal stenosis and moderate bilateral foraminal stenosis. - Additional degenerative changes and foraminal stenosis as above. - Compared to MRI from 05/22/2015, mild progression of degenerative changes, spinal stenosis and foraminal stenosis. - Incidentally noted, left renal cyst measuring 1.5 cm, slightly larger than comparison CT abdomen pelvis from 04/23/2019.     INTERPRETING PHYSICIAN:  Penni Bombard, MD Certified in Neurology, Neurophysiology and Neuroimaging  PATIENT SURVEYS:  FOTO TBD  SCREENING FOR RED FLAGS: Bowel or bladder incontinence: No   COGNITION:  Overall cognitive status: Within functional limits for tasks assessed     SENSATION: Not tested  MUSCLE LENGTH: Hamstrings: Right 70 deg; Left 70 deg   SENSATION: Not tested  CERVICAL ROM:   Active ROM A/PROM (deg) 09/25/2021  Flexion 25%  Extension 0%  Right lateral flexion 25%  Left lateral flexion 25%  Right rotation 25%  Left rotation 25%   (Blank  rows = not  tested)  UE ROM:  Active ROM Right 09/25/2021 Left 09/25/2021  Shoulder flexion 90d 90d  Shoulder extension    Shoulder abduction    Shoulder adduction    Shoulder extension    Shoulder internal rotation    Shoulder external rotation    Elbow flexion WFL WFL  Elbow extension [facility]  Wrist flexion    Wrist extension    Wrist ulnar deviation    Wrist radial deviation    Wrist pronation    Wrist supination     (Blank rows = not tested)  UE MMT: Not tested  MMT Right 09/25/2021 Left 09/25/2021  Shoulder flexion    Shoulder extension    Shoulder abduction    Shoulder adduction    Shoulder extension    Shoulder internal rotation    Shoulder external rotation    Middle trapezius    Lower trapezius    Elbow flexion    Elbow extension    Wrist flexion    Wrist extension    Wrist ulnar deviation    Wrist radial deviation    Wrist pronation    Wrist supination    Grip strength     (Blank rows = not tested)  CERVICAL SPECIAL TESTS:  Not tested due to surgery   FUNCTIONAL TESTS:  Deferred due to surgery   POSTURE: elevated Shoulders   PALPATION: deferred  LUMBAR ROM:   Active  A/PROM  eval  Flexion 50%  Extension 0%  Right lateral flexion 50%  Left lateral flexion 50%  Right rotation   Left rotation    (Blank rows = not tested)  LOWER EXTREMITY ROM:   WFL for ADLs  Active  Right eval Left eval  Hip flexion    Hip extension    Hip abduction    Hip adduction    Hip internal rotation    Hip external rotation    Knee flexion    Knee extension    Ankle dorsiflexion    Ankle plantarflexion    Ankle inversion    Ankle eversion     (Blank rows = not tested)  LOWER EXTREMITY MMT:  WFL (testing limited by pain LLE)  MMT Right eval Left eval  Hip flexion    Hip extension    Hip abduction    Hip adduction    Hip internal rotation    Hip external rotation    Knee flexion    Knee extension    Ankle dorsiflexion    Ankle plantarflexion     Ankle inversion    Ankle eversion     (Blank rows = not tested)  LUMBAR SPECIAL TESTS:  Deferred due to pain   FUNCTIONAL TESTS:  Not tested due to pain  GAIT: Distance walked: 71f x2 Assistive device utilized: None Level of assistance: Complete Independence    TODAY'S TREATMENT  Eval   PATIENT EDUCATION:  Education details: Discussed eval findings, rehab rationale and POC and patient is in agreement  Person educated: Patient Education method: Explanation Education comprehension: needs further education   HOME EXERCISE PROGRAM: TBD  ASSESSMENT:  CLINICAL IMPRESSION: Patient is a 61y.o. male who was seen today for physical therapy evaluation and treatment for general debility with underlying recent cervical fusion and LLE radiculopathy following spinal tap.    OBJECTIVE IMPAIRMENTS decreased activity tolerance, decreased coordination, decreased knowledge of condition, decreased mobility, decreased ROM, increased muscle spasms, impaired UE functional use, improper body mechanics, postural dysfunction, and pain.  REHAB POTENTIAL: Fair based on history of symptoms  CLINICAL DECISION MAKING: Evolving/moderate complexity  EVALUATION COMPLEXITY: Moderate   GOALS: Goals reviewed with patient? No  SHORT TERM GOALS: Target date: 10/09/2021  Patient to demonstrate independence in HEP  Baseline: TBD Goal status: INITIAL  2.  Obtain FOTO Baseline: TBD Goal status: INITIAL  3.  Obtain 5x STS Baseline: TBD Goal status: INITIAL    LONG TERM GOALS: Target date: 10/23/2021  6/10 worst pain Baseline: 10/10 worst pain Goal status: INITIAL  2.  Increased AROM C-spine to 50% Baseline: Active Goal status: INITIAL  3.  120d B shoulder flexion Baseline: 90d B shoulder flexion Goal status: INITIAL  4.  Patient able to stand for 5 minutes Baseline: Unable to stand for 5 min. Goal status: INITIAL     PLAN: PT FREQUENCY: 2x/week  PT DURATION: 4  weeks  PLANNED INTERVENTIONS: Therapeutic exercises, Therapeutic activity, Neuromuscular re-education, Balance training, Gait training, Patient/Family education, Joint mobilization, Stair training, Aquatic Therapy, and Re-evaluation.  PLAN FOR NEXT SESSION: f/u on clearance from surgeon and neurologist, HEP, FOTO, 5x STS   Lanice Shirts, PT 09/25/2021, 5:05 PM

## 2021-09-26 ENCOUNTER — Telehealth: Payer: Self-pay | Admitting: Neurology

## 2021-09-26 DIAGNOSIS — M48062 Spinal stenosis, lumbar region with neurogenic claudication: Secondary | ICD-10-CM | POA: Insufficient documentation

## 2021-09-26 DIAGNOSIS — M48061 Spinal stenosis, lumbar region without neurogenic claudication: Secondary | ICD-10-CM | POA: Insufficient documentation

## 2021-09-26 NOTE — Telephone Encounter (Signed)
Referral for Neurosurgery sent to [place] [phone].

## 2021-09-26 NOTE — Telephone Encounter (Signed)
  Call patient, MRI of the lumbar spine showed severe L4-5 spinal stenosis, with bilateral foraminal stenosis, this well explain his worsening low back pain  He has been referred to neurosurgeon by Dr. Lilia Pro on July 09 2021,   I also put a urgent referral for lumbar stenosis to neurosurgeon  Please make sure he has appointment with them for better pain control, even consider lumbar decompression surgery   Orders Placed This Encounter  Procedures   Ambulatory referral to Neurosurgery       MRI lumbar spine (without) demonstrating: - At L4-5: Pseudo disc bulging, facet hypertrophy with moderate to severe spinal stenosis and moderate to severe bilateral foraminal stenosis. - At L1-2: Short pedicles, facet hypertrophy resulting in mild spinal stenosis and moderate bilateral foraminal stenosis. - Additional degenerative changes and foraminal stenosis as above. - Compared to MRI from 05/22/2015, mild progression of degenerative changes, spinal stenosis and foraminal stenosis. - Incidentally noted, left renal cyst measuring 1.5 cm, slightly larger than comparison CT abdomen pelvis from 04/23/2019.

## 2021-09-26 NOTE — Addendum Note (Signed)
Addended by: Marcial Pacas on: 09/26/2021 09:20 AM   Modules accepted: Orders

## 2021-09-26 NOTE — Telephone Encounter (Signed)
I spoke to the patient. States he recently had cervical surgery w/ Dr. Trenton Gammon at [facility]. He saw Dr. Trenton Gammon on 6/14 for his post-op visit. He says at that time, Dr. Trenton Gammon also looked at his lumbar MRI and told him he would likely not need surgery. However, the patient is saying his pain is intolerable without oral medication, having difficulty walking and issues with constipation. He is unsure what to do from here.   He is asking for a call back directly from Dr. Krista Blue.

## 2021-09-26 NOTE — Addendum Note (Signed)
Addended by: Marcial Pacas on: 09/26/2021 05:27 PM   Modules accepted: Orders

## 2021-09-26 NOTE — Telephone Encounter (Signed)
I have talked with patient, he complains of severe left lumbar radicular pain, suboptimal control with gabapentin 900 3 times daily and tramadol 50 mg as needed   MRI lumbar spine on September 06 2021 did show moderate to severe spinal stenosis at L4-5.   He had post-operative follow up by Dr. Trenton Gammon following his cervical decompression surgery in May 2023.  He also had pain management visit through different clinic on June 6th,  per patient, no treatment was offered.  I will refer him to pain management at [facility] for potential t epidural injection

## 2021-09-27 ENCOUNTER — Telehealth: Payer: Self-pay | Admitting: Neurology

## 2021-09-27 MED ORDER — DULOXETINE HCL 60 MG PO CPEP: 60.0000 mg | ORAL_CAPSULE | Freq: Every day | ORAL | 6 refills | Status: DC

## 2021-09-27 MED ORDER — MELOXICAM 15 MG PO TABS: 15.0000 mg | ORAL_TABLET | Freq: Every day | ORAL | 6 refills | Status: DC

## 2021-09-27 MED ORDER — GABAPENTIN 300 MG PO CAPS: 900.0000 mg | ORAL_CAPSULE | Freq: Three times a day (TID) | ORAL | 6 refills | Status: DC

## 2021-09-27 NOTE — Telephone Encounter (Addendum)
Neurosurgeon app pending

## 2021-09-27 NOTE — Addendum Note (Signed)
Addended by: Marcial Pacas on: 09/27/2021 01:49 PM   Modules accepted: Orders

## 2021-09-27 NOTE — Telephone Encounter (Addendum)
The patient will continue his current medication regimen. He is aware to expect a call from neurosurgery to him scheduled.

## 2021-09-27 NOTE — Telephone Encounter (Signed)
Sent to [facility] ph # [phone]. 

## 2021-09-27 NOTE — Addendum Note (Signed)
Addended by: Desmond Lope on: 09/27/2021 02:19 PM   Modules accepted: Orders

## 2021-10-03 ENCOUNTER — Ambulatory Visit: Payer: Medicare Other | Admitting: Adult Health

## 2021-10-08 ENCOUNTER — Ambulatory Visit: Payer: Medicare Other

## 2021-10-08 NOTE — Therapy (Deleted)
OUTPATIENT PHYSICAL THERAPY TREATMENT NOTE   Patient Name: [name] MRN: [id number] DOB:1961/01/19, 61 y.o., male Today's Date: 10/08/2021  PCP: [name], MD REFERRING PROVIDER:Richter, Maebelle Munroe, MD   END OF SESSION:    Past Medical History:  Diagnosis Date   A-fib [facility])    Chest pain    a. 11/2002 Cath: EF 68%, LM nl, LAD small, nl, LCX nl, RCA tortuous/nl;  b. 08/2011 St Echo: EF 60%, normal contractility, no scar/ischemia.   COPD (chronic obstructive pulmonary disease) (HCC)    Dysrhythmia    Enlarged prostate    Gastrointestinal hemorrhage 08/25/2018   GERD (gastroesophageal reflux disease)    GI bleeding 08/2018   Hypertension    Sickle cell trait (HCC)    Sleep apnea    SVT (supraventricular tachycardia) ([place])    Past Surgical History:  Procedure Laterality Date   ANTERIOR CERVICAL DECOMP/DISCECTOMY FUSION N/A 08/10/2021   Procedure: CERVICAL THREE-FOUR, CERVICAL FOUR-FIVE, CERVICAL FIVE-SIX ANTERIOR CERVICAL DECOMPRESSION/DISCECTOMY FUSION;  Surgeon: Earnie Larsson, MD;  Location: [place];  Service: Neurosurgery;  Laterality: N/A;  3C/RM 20   ATRIAL FIBRILLATION ABLATION     BIOPSY  08/27/2018   Procedure: BIOPSY;  Surgeon: Thornton Park, MD;  Location: [place];  Service: Gastroenterology;;   BIOPSY  08/28/2018   Procedure: BIOPSY;  Surgeon: Thornton Park, MD;  Location: [place];  Service: Gastroenterology;;   COLONOSCOPY WITH PROPOFOL N/A 08/28/2018   Procedure: COLONOSCOPY WITH PROPOFOL;  Surgeon: Thornton Park, MD;  Location: [place];  Service: Gastroenterology;  Laterality: N/A;   ENTEROSCOPY N/A 08/27/2018   Procedure: ENTEROSCOPY;  Surgeon: Thornton Park, MD;  Location: [place];  Service: Gastroenterology;  Laterality: N/A;   GIVENS CAPSULE STUDY N/A 08/28/2018   Procedure: GIVENS CAPSULE STUDY;  Surgeon: Thornton Park, MD;  Location: [place];  Service: Gastroenterology;  Laterality: N/A;   LEFT HEART  CATHETERIZATION WITH CORONARY ANGIOGRAM N/A 03/11/2013   Procedure: LEFT HEART CATHETERIZATION WITH CORONARY ANGIOGRAM;  Surgeon: Jolaine Artist, MD;  Location: [facility] CATH LAB;  Service: Cardiovascular;  Laterality: N/A;   POLYPECTOMY  08/28/2018   Procedure: POLYPECTOMY;  Surgeon: Thornton Park, MD;  Location: [facility] ENDOSCOPY;  Service: Gastroenterology;;   TONSILLECTOMY     TRANSURETHRAL RESECTION OF PROSTATE     Patient Active Problem List   Diagnosis Date Noted   Spinal stenosis of lumbar region 09/26/2021   Acute left lumbar radiculopathy 08/28/2021   H/O excision of lamina of cervical vertebra for decompression of spinal cord 08/28/2021   Constipation 08/18/2021   S/P cervical spinal fusion - C3-6 08/17/2021   Abnormal brain MRI    Cervical spondylosis with myelopathy and radiculopathy 08/10/2021   Cervical disc disorder with radiculopathy of cervical region 05/28/2021   Myofascial pain dysfunction syndrome 05/28/2021   Vitamin D deficiency 05/28/2021   Unstable angina ([place]) 08/18/2020   Restrictive lung disease 05/17/2020   Pulmonary HTN ([place]) 03/06/2020   Ventricular arrhythmia 06/21/2019   AVM (arteriovenous malformation) of small bowel, acquired with hemorrhage 06/16/2019   COPD (chronic obstructive pulmonary disease) ([place])    Anticoagulated    Cervical radiculopathy 03/08/2019   Gastrointestinal hemorrhage with melena 10/02/2018   Adenomatous polyp of transverse colon    Stage 3a chronic kidney disease (CKD) ([place]) 08/25/2018   PAF (paroxysmal atrial fibrillation) ([place]) 08/25/2018   Dizziness 03/30/2018   Referred otalgia of left ear 12/23/2017   Sensorineural hearing loss (SNHL) of both ears 12/23/2017   Other dysphagia 10/23/2016   Pericarditis, acute 10/17/2016  S/P ablation of atrial fibrillation 10/17/2016   Hematochezia 08/16/2016   Normocytic anemia 08/16/2016   Left ventricular hypertrophy 04/17/2016   History of colon polyps 02/05/2016   Gastroesophageal  reflux disease without esophagitis 09/27/2015   Cough 08/04/2015   Cervicalgia 06/21/2015   Compression of left suprascapular nerve 06/21/2015   Memory loss 06/21/2015   Numbness and tingling in left arm 06/21/2015   Increased frequency of urination 04/25/2015   ED (erectile dysfunction) of organic origin 03/25/2015   OAB (overactive bladder) 08/20/2013   Obstructive sleep apnea (adult) (pediatric) 03/21/2013   HTN (hypertension) 03/10/2013   Nonspecific abnormal electrocardiogram (ECG) (EKG) 03/10/2013   Elevated PSA 11/06/2012   Bilateral inguinal hernia, without obstruction or gangrene, not specified as recurrent 08/28/2012   Multinodular goiter 02/25/2012   Prediabetes 05/05/2011   Mild persistent asthma without complication 94/85/4627   Mixed hyperlipidemia 05/06/2009   Nontoxic uninodular goiter 12/16/2007   Benign localized prostatic hyperplasia without lower urinary tract symptoms (LUTS) 09/15/2007   Impaired fasting glucose 06/11/2004    REFERRING DIAG: general debility  THERAPY DIAG: general debility, cervicalgia   Rationale for Evaluation and Treatment Rehabilitation  PERTINENT HISTORY: Pt is a 61 yr old male with hx of SVT, interstitial lung disease, sickle cell syndrome, CKD- last Cr 1.50, Afib; migraine; Cervical and lumbar radiculopathy and OA of knee-as well as fibromyalgia.  Also has depression and HTN; chronic fatigue syndrome - on Eliquis, and gout. 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  Also needs L THR- leg will just give out.  Pt here for f/u on Cervical myelopathy.    He had neck surgery in May 2023- still having pain form neck into arms. Surgery was C3-C6 anterior cervical discectomy with fusion 08/10/21-    He saw Neurology- dx'd with L lumbar radiculopathy- known hx of DDD of lumbar spine Started on Trileptal- 300 mg BID and continue gabapentin- seen 08/28/21.  Due to post surgical confusion- where he was readmitted to  hospital, they attempted Fluoro based LP- multiple itmes on 08/17/21- since then, having pain in LLE and L buttock since then.    Still pretty sore in arms and neck   Has to stand since they "hit a nerve" when they did lumbar puncture. And tried all 5 levels of lumbar spine and was unable to get into to do LP.    Michela Pitcher was hearing everything that was being said, but couldn't talk- when went to hospital with confusion.      Trileptal hasn't made any difference- taking 300 mg BID And can take Gabapentin-  Have only made him sleepy.      Just picked up tramadol this AM. Has taken 1 pill - got Rx from Dr Krista Blue yesterday.  Tramadol helped some- first time got relief was this AM.    Didn't have to sign contract with Dr Krista Blue.    Sitting is what causes the most pain-esp if sits too long- can get bad in as little as 2 minutes.  And walking- also sets it off.    Getting out trying to walk- LLE will feel like it's tingling- and pain in L buttocks will increase   Before LP, Gabapentin took the edge off. But didn't "fix" the pain.    L buttock pain is Burning. In crease of L buttock and radiates down into back of L calf.   PRECAUTIONS: fall, cervical fusion  SUBJECTIVE: ***  PAIN:  Are you having pain? {[id number]}   OBJECTIVE: (objective  measures completed at initial evaluation unless otherwise dated)   DIAGNOSTIC FINDINGS:    IMPRESSION:    MRI lumbar spine (without) demonstrating: - At L4-5: Pseudo disc bulging, facet hypertrophy with moderate to severe spinal stenosis and moderate to severe bilateral foraminal stenosis. - At L1-2: Short pedicles, facet hypertrophy resulting in mild spinal stenosis and moderate bilateral foraminal stenosis. - Additional degenerative changes and foraminal stenosis as above. - Compared to MRI from 05/22/2015, mild progression of degenerative changes, spinal stenosis and foraminal stenosis. - Incidentally noted, left renal cyst measuring 1.5 cm,  slightly larger than comparison CT abdomen pelvis from 04/23/2019.     INTERPRETING PHYSICIAN:  Penni Bombard, MD Certified in Neurology, Neurophysiology and Neuroimaging   PATIENT SURVEYS:  FOTO TBD   SCREENING FOR RED FLAGS: Bowel or bladder incontinence: No     COGNITION:           Overall cognitive status: Within functional limits for tasks assessed                          SENSATION: Not tested   MUSCLE LENGTH: Hamstrings: Right 70 deg; Left 70 deg     SENSATION: Not tested   CERVICAL ROM:    Active ROM A/PROM (deg) 09/25/2021  Flexion 25%  Extension 0%  Right lateral flexion 25%  Left lateral flexion 25%  Right rotation 25%  Left rotation 25%   (Blank rows = not tested)   UE ROM:   Active ROM Right 09/25/2021 Left 09/25/2021  Shoulder flexion 90d 90d  Shoulder extension      Shoulder abduction      Shoulder adduction      Shoulder extension      Shoulder internal rotation      Shoulder external rotation      Elbow flexion WFL WFL  Elbow extension [facility] WFL  Wrist flexion      Wrist extension      Wrist ulnar deviation      Wrist radial deviation      Wrist pronation      Wrist supination       (Blank rows = not tested)   UE MMT: Not tested   MMT Right 09/25/2021 Left 09/25/2021  Shoulder flexion      Shoulder extension      Shoulder abduction      Shoulder adduction      Shoulder extension      Shoulder internal rotation      Shoulder external rotation      Middle trapezius      Lower trapezius      Elbow flexion      Elbow extension      Wrist flexion      Wrist extension      Wrist ulnar deviation      Wrist radial deviation      Wrist pronation      Wrist supination      Grip strength       (Blank rows = not tested)   CERVICAL SPECIAL TESTS:  Not tested due to surgery     FUNCTIONAL TESTS:  Deferred due to surgery     POSTURE: elevated Shoulders    PALPATION: deferred   LUMBAR ROM:    Active  A/PROM  eval   Flexion 50%  Extension 0%  Right lateral flexion 50%  Left lateral flexion 50%  Right rotation    Left rotation     (  Blank rows = not tested)   LOWER EXTREMITY ROM:   WFL for ADLs   Active  Right eval Left eval  Hip flexion      Hip extension      Hip abduction      Hip adduction      Hip internal rotation      Hip external rotation      Knee flexion      Knee extension      Ankle dorsiflexion      Ankle plantarflexion      Ankle inversion      Ankle eversion       (Blank rows = not tested)   LOWER EXTREMITY MMT:  WFL (testing limited by pain LLE)   MMT Right eval Left eval  Hip flexion      Hip extension      Hip abduction      Hip adduction      Hip internal rotation      Hip external rotation      Knee flexion      Knee extension      Ankle dorsiflexion      Ankle plantarflexion      Ankle inversion      Ankle eversion       (Blank rows = not tested)   LUMBAR SPECIAL TESTS:  Deferred due to pain     FUNCTIONAL TESTS:  Not tested due to pain   GAIT: Distance walked: 44f x2 Assistive device utilized: None Level of assistance: Complete Independence       TODAY'S TREATMENT  Eval     PATIENT EDUCATION:  Education details: Discussed eval findings, rehab rationale and POC and patient is in agreement  Person educated: Patient Education method: Explanation Education comprehension: needs further education     HOME EXERCISE PROGRAM: TBD   ASSESSMENT:   CLINICAL IMPRESSION: Patient is a 61y.o. male who was seen today for physical therapy evaluation and treatment for general debility with underlying recent cervical fusion and LLE radiculopathy following spinal tap.      OBJECTIVE IMPAIRMENTS decreased activity tolerance, decreased coordination, decreased knowledge of condition, decreased mobility, decreased ROM, increased muscle spasms, impaired UE functional use, improper body mechanics, postural dysfunction, and pain.    REHAB POTENTIAL:  Fair based on history of symptoms   CLINICAL DECISION MAKING: Evolving/moderate complexity   EVALUATION COMPLEXITY: Moderate     GOALS: Goals reviewed with patient? No   SHORT TERM GOALS: Target date: 10/09/2021   Patient to demonstrate independence in HEP  Baseline: TBD Goal status: INITIAL   2.  Obtain FOTO Baseline: TBD Goal status: INITIAL   3.  Obtain 5x STS Baseline: TBD Goal status: INITIAL       LONG TERM GOALS: Target date: 10/23/2021   6/10 worst pain Baseline: 10/10 worst pain Goal status: INITIAL   2.  Increased AROM C-spine to 50% Baseline: Active Goal status: INITIAL   3.  120d B shoulder flexion Baseline: 90d B shoulder flexion Goal status: INITIAL   4.  Patient able to stand for 5 minutes Baseline: Unable to stand for 5 min. Goal status: INITIAL         PLAN: PT FREQUENCY: 2x/week   PT DURATION: 4 weeks   PLANNED INTERVENTIONS: Therapeutic exercises, Therapeutic activity, Neuromuscular re-education, Balance training, Gait training, Patient/Family education, Joint mobilization, Stair training, Aquatic Therapy, and Re-evaluation.   PLAN FOR NEXT SESSION: f/u on clearance from surgeon and neurologist, HEP, FOTO, 5x  STS    Lanice Shirts, PT 10/08/2021, 11:22 AM

## 2021-10-10 ENCOUNTER — Encounter: Payer: Self-pay | Admitting: Neurology

## 2021-10-10 ENCOUNTER — Ambulatory Visit (INDEPENDENT_AMBULATORY_CARE_PROVIDER_SITE_OTHER): Payer: Medicare Other | Admitting: Neurology

## 2021-10-10 VITALS — BP 128/87 | HR 74 | Ht 71.0 in | Wt 202.0 lb

## 2021-10-10 DIAGNOSIS — Z0289 Encounter for other administrative examinations: Secondary | ICD-10-CM

## 2021-10-10 DIAGNOSIS — M48061 Spinal stenosis, lumbar region without neurogenic claudication: Secondary | ICD-10-CM | POA: Diagnosis not present

## 2021-10-10 DIAGNOSIS — M5416 Radiculopathy, lumbar region: Secondary | ICD-10-CM

## 2021-10-10 MED ORDER — OXCARBAZEPINE 300 MG PO TABS: 300.0000 mg | ORAL_TABLET | Freq: Three times a day (TID) | ORAL | 6 refills | Status: AC

## 2021-10-10 NOTE — Procedures (Signed)
Full Name: [name] Gender: Male MRN #: [id number] Date of Birth: Feb 18, 1961    Visit Date: 10/10/2021 10:17 Age: 61 Years Examining Physician: Marcial Pacas Referring Physician: Marcial Pacas Height: 5 feet 11 inch History: 61 year old male with history of lumbar degenerative disease, status post cervical decompression, presenting with significant left low back pain, radiating pain to the left hip, buttock region  Summary of the test: Nerve conduction study: Left peroneal, tibial motor responses were normal.  Left sural, superficial peroneal sensory responses were normal.  Tibial H reflexes were present and symmetric  Electromyography: Selected needle examination of bilateral lower extremity muscles, bilateral lumbosacral paraspinal muscles were performed.  There is widespread mild chronic neuropathic changes involving bilateral L4, 5, S1 myotomes.  There is increased insertional activity at bilateral lower lumbar paraspinal muscles   Conclusion: This is an abnormal study.  There is no evidence of large fiber peripheral neuropathy, there is evidence of mild chronic lumbosacral radiculopathies, involving bilateral L4-5 S1 myotomes.    ------------------------------- Marcial Pacas M.D. PhD  [facility] [address], [place], [place] [address] Tel: [phone] Fax: [phone]  Verbal informed consent was obtained from the patient, patient was informed of potential risk of procedure, including bruising, bleeding, hematoma formation, infection, muscle weakness, muscle pain, numbness, among others.        Plain View    Nerve / Sites Muscle Latency Ref. Amplitude Ref. Rel Amp Segments Distance Velocity Ref. Area    ms ms mV mV %  cm m/s m/s mVms  L Peroneal - EDB     Ankle EDB 5.4 ?6.5 3.6 ?2.0 100 Ankle - EDB 9   9.1     Fib head EDB 11.2  3.2  89.9 Fib head - Ankle 32 55 ?44 9.3     Pop fossa EDB 13.3  2.9  90.5 Pop fossa - Fib head 12 57 ?44  8.9         Pop fossa - Ankle      L Tibial - AH     Ankle AH 4.5 ?5.8 14.8 ?4.0 100 Ankle - AH 9   28.0     Pop fossa AH 14.9  9.7  65.2 Pop fossa - Ankle 56 54 ?41 24.0         SNC    Nerve / Sites Rec. Site Peak Lat Ref.  Amp Ref. Segments Distance    ms ms V V  cm  L Sural - Ankle (Calf)     Calf Ankle 4.0 ?4.4 13 ?6 Calf - Ankle 14  L Superficial peroneal - Ankle     Lat leg Ankle 3.8 ?4.4 11 ?6 Lat leg - Ankle 14         F  Wave    Nerve F Lat Ref.   ms ms  L Tibial - AH 57.8 ?56.0       H Reflex    Nerve H Lat Lat Hmax   ms ms   Left Right Ref. Left Right Ref.  Tibial - [place] 39.1 39.1 ?35.0 35.8 34.7 ?35.0         EMG Summary Table    Spontaneous MUAP Recruitment  Muscle IA Fib PSW Fasc Other Amp Dur. Poly Pattern  L. Tibialis anterior Normal None None None _______ Normal Normal Normal Reduced  L. Tibialis posterior Normal None None None _______ Normal Normal Normal Reduced  L. Peroneus longus Normal None None None _______ Normal Normal  Normal Reduced  L. Gastrocnemius (Medial head) Normal None None None _______ Normal Normal Normal Reduced  L. Vastus lateralis Normal None None None _______ Normal Normal Normal Reduced  L. Biceps femoris (long head) Normal None None None _______ Normal Normal Normal Reduced  L. Gluteus medius Normal None None None _______ Normal Normal Normal Reduced  L. Gluteus maximus Increased None None None _______ Normal Normal Normal Reduced  R. Peroneus longus Increased None None None _______ Normal Normal Normal Reduced  R. Tibialis anterior Increased None None None _______ Normal Normal Normal Reduced  R. Tibialis posterior Normal None None None _______ Normal Normal Normal Reduced  R. Gastrocnemius (Medial head) Increased None None None _______ Normal Normal Normal Reduced  R. Lumbar paraspinals (low) Increased 1+ None None _______ Normal Normal 1+ Normal  R. Lumbar paraspinals (mid) Normal None None None _______ Normal Normal Normal  Normal  L. Lumbar paraspinals (low) Increased 1+ None None _______ Normal Normal 1+ Normal  L. Lumbar paraspinals (mid) Normal None None None _______ Normal Normal Normal Normal

## 2021-10-10 NOTE — Progress Notes (Signed)
ASSESSMENT AND PLAN  [name] is a 61 y.o. 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 Lorrine Kin, we will communicate with Dr. Lorrine Kin, hope to move up appointment for better pain control    DIAGNOSTIC DATA (LABS, IMAGING, TESTING) - I reviewed patient records, labs, notes, testing and imaging myself where available.  Laboratory evaluations Aug 19, 2021, Vancomyocin rough level was 14, potassium 3.3, creatinine 1.44, calcium 8.7,  Blood culture negative twice, CBC, hemoglobin 12.5, negative alcohol, UDS was positive for opiates, MEDICAL HISTORY:  [name] is a 61 year-old male, seen in request by his primary care physician Dr. Hal Hope, Clydie Braun for evaluation of left low back pain, radiating pain into left lower extremity, initial evaluation was on Aug 28 2021  I reviewed and summarized the referring note.  Past medical history Hypertension Paroxysmal atrial fibrillation Chronic kidney disease History of cervical radiculopathy status post cervical decompression on Aug 10, 2021,  Patient has a history of cervical radiculopathy, presenting with worsening neck pain, radiating pain to bilateral upper extremity, right more than left, mainly involving bilateral fourth and fifth fingers, he eventually underwent C3-4, C4-5, C5-6 anterior cervical discectomy with interbody fusion on Aug 10 2021.  Surgery did help his  neck pain, left arm numbness and weakness, he was readmitted to hospital on Aug 16, 2021 for acute onset of confusion, his sister was there from out of town helping care for him after surgery, then he was found to be confused  He is on polypharmacy for his pain, this including Norco 5/325 mg 5 to 6 tablets a day, tramadol 50 mg twice a day, gabapentin 300 mg 4 times a day, also has severe constipation  Initial CT head CT angiogram of head and neck was negative Laboratory MRI of brain Aug 17, 2021, there was 1 cm ringlike lesion with adjacent subtle leptomeningeal signal involving the left occipital lobe, worried about the possibility of postoperative infection, he had fluoroscopy guided lumbar puncture on Aug 17, 2021, multiple attempts at L3-4, L2-3, L1-2 without success  Patient reported that during lumbar puncture, he noticed sharp radiating pain to his left buttock, lateral thigh and calf region, which has persisted even now, persistent low degree of pain, intermittent very sharp radiating pain,  Update October 10, 2021 Patient return for electrodiagnostic study today, which showed evidence of mild chronic bilateral lumbosacral radiculopathy  Personally reviewed MRI of lumbar spine September 06, 2021: L4-5 pseudodisc bulging, facet hypertrophy with moderate to severe spinal stenosis, moderate to severe bilateral foraminal stenosis  Multilevel degenerative changes  CT pelvic with contrast showed no significant abnormality  Patient complains of significant left low back pain, radiating pain to left hip, buttock region, lateral thigh, calf, and lateral 3 toes  He denies bowel and bladder incontinence  He was seen by [facility], on schedule for epidural injection in August  Despite polypharmacy, including tramadol 50 mg twice daily,  gabapentin 300 mg 3 tablets 3 times a day, he complains of uncontrollable pain, difficult to function   PHYSICAL EXAM:   128/87, heart rate of  74,  PHYSICAL EXAMNIATION:  Gen: NAD, conversant, well nourised, well groomed                     Cardiovascular: Regular rate rhythm, no peripheral edema, warm, nontender. Eyes: Conjunctivae clear without exudates or hemorrhage Neck: No carotid bruits, limited range of motion of neck Pulmonary: Clear to auscultation bilaterally   NEUROLOGICAL EXAM:  MENTAL STATUS: Mild distress, uncomfortable in sitting position, pacing around, Speech/cognition: Awake, alert, oriented to history taking and casual conversation CRANIAL NERVES: CN II: Visual fields are full to confrontation. Pupils are round equal and briskly reactive to light. CN III, IV, VI: extraocular movement are normal. No ptosis. CN V: Facial sensation is intact to light touch CN VII: Face is symmetric with normal eye closure  CN VIII: Hearing is normal to causal conversation. CN IX, X: Phonation is normal. CN XI: Head turning and shoulder shrug are intact  MOTOR: No significant upper and lower extremity muscle weakness  REFLEXES: Reflexes are 2+ and symmetric at the biceps, triceps, trace at knees, and ankles. Plantar responses are flexor.  SENSORY: Intact to light touch, pinprick and vibratory sensation are intact in fingers and toes.  COORDINATION: There is no trunk or limb dysmetria noted.  GAIT/STANCE: He needs push-up to get up from sitting position, antalgic, cautious  REVIEW OF SYSTEMS:  Full 14 system review of systems performed and notable only for as above All other review of systems were negative.   ALLERGIES: No Known Allergies  HOME MEDICATIONS: Current Outpatient Medications  Medication Sig Dispense Refill   acetaminophen (TYLENOL) 325 MG tablet Take 650 mg by mouth every 6 (six) hours as needed for moderate pain.     amLODipine (NORVASC) 5 MG tablet Take 1 tablet (5 mg total) by mouth daily. 30 tablet 0   apixaban (ELIQUIS) 5 MG TABS tablet Take 1 tablet (5 mg total) by mouth 2 (two) times daily.  Take Eliquis 5 mg twice a day one week after GI bleed.  Do not restart Xarelto. (Patient taking differently: Take 5 mg by mouth 2 (two) times daily.) 60 tablet 0   cetirizine (ZYRTEC) 10 MG tablet Take 10 mg by mouth daily as needed for allergies.      Colchicine 0.6 MG CAPS Take 0.6 mg by mouth 2 (two) times daily as needed (gout flare).     cyclobenzaprine (FLEXERIL) 10 MG tablet Take 1 tablet (10 mg total) by mouth 3 (three) times daily as needed for muscle spasms. 30 tablet 0   EMGALITY 120 MG/ML SOAJ Inject 120 mg into the skin every 30 (thirty) days.     famotidine (PEPCID) 20 MG tablet Take 40 mg by mouth daily as needed for heartburn or indigestion.     ferrous sulfate 325 (65 FE) MG tablet Take 1 tablet (325 mg total) by mouth daily with breakfast. 60 tablet 0   fluticasone (FLONASE) 50 MCG/ACT nasal spray Place 1 spray into both nostrils daily as needed for allergies or rhinitis.     Fluticasone-Salmeterol (ADVAIR) 250-50 MCG/DOSE AEPB Inhale 1 puff into the lungs 2 (two) times daily as needed (wheezing).     gabapentin (NEURONTIN) 300 MG capsule Take 3 capsules (900 mg total) by mouth 3 (three) times daily. 270 capsule 6   guaiFENesin (MUCINEX) 600 MG 12 hr tablet Take  600 mg by mouth daily as needed for to loosen phlegm.     HYDROcodone-acetaminophen (NORCO/VICODIN) 5-325 MG tablet Take 1-2 tablets by mouth every 4 (four) hours as needed for moderate pain. 20 tablet 0   hydrocortisone (ANUSOL-HC) 25 MG suppository Place 1 suppository (25 mg total) rectally 2 (two) times daily. For 7 days 14 suppository 0   isosorbide mononitrate (IMDUR) 30 MG 24 hr tablet Take 30 mg by mouth daily.     linaclotide (LINZESS) 145 MCG CAPS capsule Take 145 mcg by mouth daily as needed (Constipation).     losartan (COZAAR) 25 MG tablet Take 25 mg by mouth daily.     nitroGLYCERIN (NITROSTAT) 0.4 MG SL tablet Place 1 tablet (0.4 mg total) under the tongue every 5 (five) minutes as needed for chest pain. 20  tablet 1   NON FORMULARY CPAP at bedtime     ondansetron (ZOFRAN) 8 MG tablet Take 8 mg by mouth every 8 (eight) hours as needed for nausea or vomiting.      pantoprazole (PROTONIX) 40 MG tablet Take 40 mg by mouth daily.     polyethylene glycol (MIRALAX / GLYCOLAX) 17 g packet Take 17 g by mouth daily. (Patient taking differently: Take 17 g by mouth daily as needed for mild constipation.) 14 each 0   sildenafil (REVATIO) 20 MG tablet Take 60-100 mg by mouth daily as needed (ED).     sotalol (BETAPACE) 80 MG tablet Take 1 tablet (80 mg total) by mouth every 12 (twelve) hours. 60 tablet 0   SUMAtriptan (IMITREX) 100 MG tablet Take 100 mg by mouth as directed. Take one tablet at onset, then an additional tablet every 12 hours as needed for migraine     traMADol (ULTRAM) 50 MG tablet Take 50 mg by mouth every 6 (six) hours as needed for moderate pain.     traMADol (ULTRAM) 50 MG tablet Take 1 tablet (50 mg total) by mouth every 12 (twelve) hours as needed. 60 tablet 1   triamterene-hydrochlorothiazide (DYAZIDE) 37.5-25 MG capsule Take 1 each (1 capsule total) by mouth every morning.     No current facility-administered medications for this visit.    PAST MEDICAL HISTORY: Past Medical History:  Diagnosis Date   A-fib [facility])    Chest pain    a. 11/2002 Cath: EF 68%, LM nl, LAD small, nl, LCX nl, RCA tortuous/nl;  b. 08/2011 St Echo: EF 60%, normal contractility, no scar/ischemia.   COPD (chronic obstructive pulmonary disease) (HCC)    Dysrhythmia    Enlarged prostate    Gastrointestinal hemorrhage 08/25/2018   GERD (gastroesophageal reflux disease)    GI bleeding 08/2018   Hypertension    Sickle cell trait (HCC)    Sleep apnea    SVT (supraventricular tachycardia) (HCC)     PAST SURGICAL HISTORY: Past Surgical History:  Procedure Laterality Date   ANTERIOR CERVICAL DECOMP/DISCECTOMY FUSION N/A 08/10/2021   Procedure: CERVICAL THREE-FOUR, CERVICAL FOUR-FIVE, CERVICAL FIVE-SIX ANTERIOR  CERVICAL DECOMPRESSION/DISCECTOMY FUSION;  Surgeon: Julio Sicks, MD;  Location: [facility] OR;  Service: Neurosurgery;  Laterality: N/A;  3C/RM 20   ATRIAL FIBRILLATION ABLATION     BIOPSY  08/27/2018   Procedure: BIOPSY;  Surgeon: Tressia Danas, MD;  Location: [facility] ENDOSCOPY;  Service: Gastroenterology;;   BIOPSY  08/28/2018   Procedure: BIOPSY;  Surgeon: Tressia Danas, MD;  Location: [facility] ENDOSCOPY;  Service: Gastroenterology;;   COLONOSCOPY WITH PROPOFOL N/A 08/28/2018   Procedure: COLONOSCOPY WITH PROPOFOL;  Surgeon: Tressia Danas, MD;  Location: [facility] ENDOSCOPY;  Service: Gastroenterology;  Laterality: N/A;   ENTEROSCOPY N/A 08/27/2018   Procedure: ENTEROSCOPY;  Surgeon: Thornton Park, MD;  Location: [place];  Service: Gastroenterology;  Laterality: N/A;   GIVENS CAPSULE STUDY N/A 08/28/2018   Procedure: GIVENS CAPSULE STUDY;  Surgeon: Thornton Park, MD;  Location: [place];  Service: Gastroenterology;  Laterality: N/A;   LEFT HEART CATHETERIZATION WITH CORONARY ANGIOGRAM N/A 03/11/2013   Procedure: LEFT HEART CATHETERIZATION WITH CORONARY ANGIOGRAM;  Surgeon: Jolaine Artist, MD;  Location: [facility] CATH LAB;  Service: Cardiovascular;  Laterality: N/A;   POLYPECTOMY  08/28/2018   Procedure: POLYPECTOMY;  Surgeon: Thornton Park, MD;  Location: [facility] ENDOSCOPY;  Service: Gastroenterology;;   TONSILLECTOMY     TRANSURETHRAL RESECTION OF PROSTATE      FAMILY HISTORY: Family History  Problem Relation Age of Onset   CAD Mother        died in her 62's - MI   Multiple myeloma Brother    Sickle cell anemia Father        died @ 70   Gastric cancer Neg Hx     SOCIAL HISTORY: Social History   Socioeconomic History   Marital status: Divorced    Spouse name: Not on file   Number of children: Not on file   Years of education: Not on file   Highest education level: Not on file  Occupational History   Not on file  Tobacco Use   Smoking status: Never   Smokeless tobacco: Never   Vaping Use   Vaping Use: Never used  Substance and Sexual Activity   Alcohol use: No   Drug use: Never   Sexual activity: Not on file  Other Topics Concern   Not on file  Social History Narrative   Lives in [place] with dtr.   Social Determinants of Health   Financial Resource Strain: Not on file  Food Insecurity: Not on file  Transportation Needs: Not on file  Physical Activity: Not on file  Stress: Not on file  Social Connections: Not on file  Intimate Partner Violence: Not on file      Marcial Pacas, M.D. Ph.D.  [facility] [address] [place], [place] [address] Ph: [phone] Fax: [phone]  CC:  [name], MD [address] [place],  [place] [id number]  [name], MD

## 2021-10-16 ENCOUNTER — Ambulatory Visit: Payer: Medicare Other | Attending: Family Medicine

## 2021-10-16 DIAGNOSIS — M4322 Fusion of spine, cervical region: Secondary | ICD-10-CM | POA: Diagnosis present

## 2021-10-16 DIAGNOSIS — R5381 Other malaise: Secondary | ICD-10-CM | POA: Diagnosis present

## 2021-10-16 NOTE — Therapy (Addendum)
OUTPATIENT PHYSICAL THERAPY TREATMENT NOTE/DC SUMMARY   Patient Name: [name] MRN: [id number] DOB:[birth year], 61 y.o., male Today's Date: 10/16/2021  PCP: [name], MD  REFERRING PROVIDER: [name], MD  PHYSICAL THERAPY DISCHARGE SUMMARY  Visits from Start of Care: 2  Current functional level related to goals / functional outcomes: UTA   Remaining deficits: Pain and ROM deficits   Education / Equipment: HEP   Patient agrees to discharge. Patient goals were not met. Patient is being discharged due to not returning since the last visit.  END OF SESSION:   PT End of Session - 10/16/21 1316     Visit Number 2    Number of Visits 8    Date for PT Re-Evaluation 10/23/21    Authorization Type [employer] MCR    PT Start Time 7253    PT Stop Time 1355    PT Time Calculation (min) 40 min    Activity Tolerance Patient tolerated treatment well    Behavior During Therapy WFL for tasks assessed/performed             Past Medical History:  Diagnosis Date   A-fib ([place])    Chest pain    a. 11/2002 Cath: EF 68%, LM nl, LAD small, nl, LCX nl, RCA tortuous/nl;  b. 08/2011 St Echo: EF 60%, normal contractility, no scar/ischemia.   COPD (chronic obstructive pulmonary disease) (HCC)    Dysrhythmia    Enlarged prostate    Gastrointestinal hemorrhage 08/25/2018   GERD (gastroesophageal reflux disease)    GI bleeding 08/2018   Hypertension    Sickle cell trait (HCC)    Sleep apnea    SVT (supraventricular tachycardia) ([place])    Past Surgical History:  Procedure Laterality Date   ANTERIOR CERVICAL DECOMP/DISCECTOMY FUSION N/A 08/10/2021   Procedure: CERVICAL THREE-FOUR, CERVICAL FOUR-FIVE, CERVICAL FIVE-SIX ANTERIOR CERVICAL DECOMPRESSION/DISCECTOMY FUSION;  Surgeon: Earnie Larsson, MD;  Location: [place];  Service: Neurosurgery;  Laterality: N/A;  3C/RM 20   ATRIAL FIBRILLATION ABLATION     BIOPSY  08/27/2018   Procedure: BIOPSY;  Surgeon: Thornton Park, MD;   Location: [place];  Service: Gastroenterology;;   BIOPSY  08/28/2018   Procedure: BIOPSY;  Surgeon: Thornton Park, MD;  Location: [place];  Service: Gastroenterology;;   COLONOSCOPY WITH PROPOFOL N/A 08/28/2018   Procedure: COLONOSCOPY WITH PROPOFOL;  Surgeon: Thornton Park, MD;  Location: [place];  Service: Gastroenterology;  Laterality: N/A;   ENTEROSCOPY N/A 08/27/2018   Procedure: ENTEROSCOPY;  Surgeon: Thornton Park, MD;  Location: [place];  Service: Gastroenterology;  Laterality: N/A;   GIVENS CAPSULE STUDY N/A 08/28/2018   Procedure: GIVENS CAPSULE STUDY;  Surgeon: Thornton Park, MD;  Location: [place];  Service: Gastroenterology;  Laterality: N/A;   LEFT HEART CATHETERIZATION WITH CORONARY ANGIOGRAM N/A 03/11/2013   Procedure: LEFT HEART CATHETERIZATION WITH CORONARY ANGIOGRAM;  Surgeon: Jolaine Artist, MD;  Location: [facility] CATH LAB;  Service: Cardiovascular;  Laterality: N/A;   POLYPECTOMY  08/28/2018   Procedure: POLYPECTOMY;  Surgeon: Thornton Park, MD;  Location: [place];  Service: Gastroenterology;;   TONSILLECTOMY     TRANSURETHRAL RESECTION OF PROSTATE     Patient Active Problem List   Diagnosis Date Noted   Spinal stenosis of lumbar region 09/26/2021   Acute left lumbar radiculopathy 08/28/2021   H/O excision of lamina of cervical vertebra for decompression of spinal cord 08/28/2021   Constipation 08/18/2021   S/P cervical spinal fusion - C3-6 08/17/2021   Abnormal brain MRI  Cervical spondylosis with myelopathy and radiculopathy 08/10/2021   Cervical disc disorder with radiculopathy of cervical region 05/28/2021   Myofascial pain dysfunction syndrome 05/28/2021   Vitamin D deficiency 05/28/2021   Unstable angina ([place]) 08/18/2020   Restrictive lung disease 05/17/2020   Pulmonary HTN ([place]) 03/06/2020   Ventricular arrhythmia 06/21/2019   AVM (arteriovenous malformation) of small bowel, acquired with hemorrhage 06/16/2019    COPD (chronic obstructive pulmonary disease) ([place])    Anticoagulated    Cervical radiculopathy 03/08/2019   Gastrointestinal hemorrhage with melena 10/02/2018   Adenomatous polyp of transverse colon    Stage 3a chronic kidney disease (CKD) ([place]) 08/25/2018   PAF (paroxysmal atrial fibrillation) ([place]) 08/25/2018   Dizziness 03/30/2018   Referred otalgia of left ear 12/23/2017   Sensorineural hearing loss (SNHL) of both ears 12/23/2017   Other dysphagia 10/23/2016   Pericarditis, acute 10/17/2016   S/P ablation of atrial fibrillation 10/17/2016   Hematochezia 08/16/2016   Normocytic anemia 08/16/2016   Left ventricular hypertrophy 04/17/2016   History of colon polyps 02/05/2016   Gastroesophageal reflux disease without esophagitis 09/27/2015   Cough 08/04/2015   Cervicalgia 06/21/2015   Compression of left suprascapular nerve 06/21/2015   Memory loss 06/21/2015   Numbness and tingling in left arm 06/21/2015   Increased frequency of urination 04/25/2015   ED (erectile dysfunction) of organic origin 03/25/2015   OAB (overactive bladder) 08/20/2013   Obstructive sleep apnea (adult) (pediatric) 03/21/2013   HTN (hypertension) 03/10/2013   Nonspecific abnormal electrocardiogram (ECG) (EKG) 03/10/2013   Elevated PSA 11/06/2012   Bilateral inguinal hernia, without obstruction or gangrene, not specified as recurrent 08/28/2012   Multinodular goiter 02/25/2012   Prediabetes 05/05/2011   Mild persistent asthma without complication 40/98/1191   Mixed hyperlipidemia 05/06/2009   Nontoxic uninodular goiter 12/16/2007   Benign localized prostatic hyperplasia without lower urinary tract symptoms (LUTS) 09/15/2007   Impaired fasting glucose 06/11/2004    REFERRING DIAG: general debility, neck pain post-op cervical fusion  THERAPY DIAG: general debility, neck pain post-op cervical fusion   Rationale for Evaluation and Treatment Rehabilitation  PERTINENT HISTORY: Pt is a 61 yr old male  with hx of SVT, interstitial lung disease, sickle cell syndrome, CKD- last Cr 1.50, Afib; migraine; Cervical and lumbar radiculopathy and OA of knee-as well as fibromyalgia.  Also has depression and HTN; chronic fatigue syndrome - on Eliquis, and gout. 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  Also needs L THR- leg will just give out.  Pt here for f/u on Cervical myelopathy.    He had neck surgery in May 2023- still having pain form neck into arms. Surgery was C3-C6 anterior cervical discectomy with fusion 08/10/21-    He saw Neurology- dx'd with L lumbar radiculopathy- known hx of DDD of lumbar spine Started on Trileptal- 300 mg BID and continue gabapentin- seen 08/28/21.  Due to post surgical confusion- where he was readmitted to hospital, they attempted Fluoro based LP- multiple itmes on 08/17/21- since then, having pain in LLE and L buttock since then.    Still pretty sore in arms and neck   Has to stand since they "hit a nerve" when they did lumbar puncture. And tried all 5 levels of lumbar spine and was unable to get into to do LP.    Michela Pitcher was hearing everything that was being said, but couldn't talk- when went to hospital with confusion.      Trileptal hasn't made any difference- taking 300 mg BID And  can take Gabapentin-  Have only made him sleepy.      Just picked up tramadol this AM. Has taken 1 pill - got Rx from Dr Krista Blue yesterday.  Tramadol helped some- first time got relief was this AM.    Didn't have to sign contract with Dr Krista Blue.    Sitting is what causes the most pain-esp if sits too long- can get bad in as little as 2 minutes.  And walking- also sets it off.    Getting out trying to walk- LLE will feel like it's tingling- and pain in L buttocks will increase   Before LP, Gabapentin took the edge off. But didn't "fix" the pain.    L buttock pain is Burning. In crease of L buttock and radiates down into back of L calf  PRECAUTIONS: cervical  fusion May 2023 C3-C6 anterior cervical discectomy with fusion  SUBJECTIVE: R shoulder and neck pain improved, still unable to reach [facility]  PAIN:  Are you having pain? yes, 5/10, R shoulder UE, worse with OH and cross body reaching, better with rest   OBJECTIVE:  DIAGNOSTIC FINDINGS:    IMPRESSION:    MRI lumbar spine (without) demonstrating: - At L4-5: Pseudo disc bulging, facet hypertrophy with moderate to severe spinal stenosis and moderate to severe bilateral foraminal stenosis. - At L1-2: Short pedicles, facet hypertrophy resulting in mild spinal stenosis and moderate bilateral foraminal stenosis. - Additional degenerative changes and foraminal stenosis as above. - Compared to MRI from 05/22/2015, mild progression of degenerative changes, spinal stenosis and foraminal stenosis. - Incidentally noted, left renal cyst measuring 1.5 cm, slightly larger than comparison CT abdomen pelvis from 04/23/2019.     INTERPRETING PHYSICIAN:  Penni Bombard, MD Certified in Neurology, Neurophysiology and Neuroimaging   PATIENT SURVEYS:  FOTO TBD   SCREENING FOR RED FLAGS: Bowel or bladder incontinence: No     COGNITION:           Overall cognitive status: Within functional limits for tasks assessed                          SENSATION: Not tested   MUSCLE LENGTH: Hamstrings: Right 70 deg; Left 70 deg     SENSATION: Not tested   CERVICAL ROM:    Active ROM A/PROM (deg) 09/25/2021  Flexion 25%  Extension 0%  Right lateral flexion 25%  Left lateral flexion 25%  Right rotation 25%  Left rotation 25%   (Blank rows = not tested)   UE ROM:   Active ROM Right 09/25/2021 Left 09/25/2021  Shoulder flexion 90d 90d  Shoulder extension      Shoulder abduction      Shoulder adduction      Shoulder extension      Shoulder internal rotation      Shoulder external rotation      Elbow flexion [facility] WFL  Elbow extension [facility]  Wrist flexion      Wrist extension      Wrist ulnar  deviation      Wrist radial deviation      Wrist pronation      Wrist supination       (Blank rows = not tested)   UE MMT: Not tested   MMT Right 09/25/2021 Left 09/25/2021  Shoulder flexion      Shoulder extension      Shoulder abduction      Shoulder adduction      Shoulder  extension      Shoulder internal rotation      Shoulder external rotation      Middle trapezius      Lower trapezius      Elbow flexion      Elbow extension      Wrist flexion      Wrist extension      Wrist ulnar deviation      Wrist radial deviation      Wrist pronation      Wrist supination      Grip strength       (Blank rows = not tested)   CERVICAL SPECIAL TESTS:  Not tested due to surgery     FUNCTIONAL TESTS:  Deferred due to surgery     POSTURE: elevated Shoulders    PALPATION: deferred   LUMBAR ROM:    Active  A/PROM  eval  Flexion 50%  Extension 0%  Right lateral flexion 50%  Left lateral flexion 50%  Right rotation    Left rotation     (Blank rows = not tested)   LOWER EXTREMITY ROM:   WFL for ADLs   Active  Right eval Left eval  Hip flexion      Hip extension      Hip abduction      Hip adduction      Hip internal rotation      Hip external rotation      Knee flexion      Knee extension      Ankle dorsiflexion      Ankle plantarflexion      Ankle inversion      Ankle eversion       (Blank rows = not tested)   LOWER EXTREMITY MMT:  WFL (testing limited by pain LLE)   MMT Right eval Left eval  Hip flexion      Hip extension      Hip abduction      Hip adduction      Hip internal rotation      Hip external rotation      Knee flexion      Knee extension      Ankle dorsiflexion      Ankle plantarflexion      Ankle inversion      Ankle eversion       (Blank rows = not tested)   LUMBAR SPECIAL TESTS:  Deferred due to pain     FUNCTIONAL TESTS:  Not tested due to pain   GAIT: Distance walked: 71f x2 Assistive device utilized: None Level  of assistance: Complete Independence       TODAY'S TREATMENT  [facility] Adult PT Treatment:                                                DATE: 10/16/21 Therapeutic Exercise: UBE L1 6 min Upper trap stretch R 30s x3 Levator stretch R 30s x3 Supine chin tuck over towel roll 3x10 3s hold Supine OH flexion 10x w/cane(limited by R shoulder pain) Supine protraction 15x w/cane(unable to perform due to R shoulder pain) Shoulder shrugs 10x Scapular retractions 10x      PATIENT EDUCATION:  Education details: Discussed eval findings, rehab rationale and POC and patient is in agreement  Person educated: Patient Education method: Explanation Education comprehension: needs further education  HOME EXERCISE PROGRAM: Access Code: [email or web address] URL: [email or web address] Date: 10/16/2021 Prepared by: Sharlynn Oliphant  Exercises - Seated Upper Trap Stretch  - 2 x daily - 7 x weekly - 1 sets - 3 reps - 30s hold - Seated Levator Scapulae Stretch  - 2 x daily - 7 x weekly - 1 sets - 3 reps - 30s hold - Supine Cervical Retraction with Towel  - 2 x daily - 7 x weekly - 3 sets - 10 reps - 3s hold - Supine Shoulder Flexion AAROM with Dowel  - 2 x daily - 7 x weekly - 2 sets - 15 reps - Supine Shoulder Protraction with Dowel  - 2 x daily - 7 x weekly - 2 sets - 15 reps   ASSESSMENT:   CLINICAL IMPRESSION: Patient returns for first f/u session.  Per Dr. Annette Stable, patient cleared for cervical OPPT, per Dr. Krista Blue radiculopathy will be addressed by pain management.  Sitting tolerance remains limited by radicular symptoms which appear insidiously.  Today's session established a HEP for stretching, postural correction, R shoulder mobility and aerobic work.  Limited tolerance to R shoulder tasks due to pain.     OBJECTIVE IMPAIRMENTS decreased activity tolerance, decreased coordination, decreased knowledge of condition, decreased mobility, decreased ROM, increased muscle spasms, impaired UE functional use,  improper body mechanics, postural dysfunction, and pain.    REHAB POTENTIAL: Fair based on history of symptoms   CLINICAL DECISION MAKING: Evolving/moderate complexity   EVALUATION COMPLEXITY: Moderate     GOALS: Goals reviewed with patient? No   SHORT TERM GOALS: Target date: 10/09/2021   Patient to demonstrate independence in HEP  Baseline: TBD; 10/15/21 Access Code: [email or web address] Goal status: INITIAL   2.  Obtain FOTO Baseline: TBD Goal status: INITIAL   3.  Obtain 5x STS Baseline: TBD Goal status: deferred by MD       LONG TERM GOALS: Target date:11/25/2021   6/10 worst pain Baseline: 10/10 worst pain Goal status: INITIAL   2.  Increased AROM C-spine to 50% Baseline: Active Goal status: INITIAL   3.  120d B shoulder flexion Baseline: 90d B shoulder flexion Goal status: INITIAL   4.  Patient able to stand for 5 minutes Baseline: Unable to stand for 5 min. Goal status: Deferred by MD         PLAN: PT FREQUENCY: 2x/week   PT DURATION: 4 weeks   PLANNED INTERVENTIONS: Therapeutic exercises, Therapeutic activity, Neuromuscular re-education, Balance training, Gait training, Patient/Family education, Joint mobilization, Stair training, Aquatic Therapy, and Re-evaluation.   PLAN FOR NEXT SESSION: Posture exercises, ROM R shoulder and c-spine, strengthening and flexibility tasks.     Lanice Shirts, PT 10/16/2021, 1:59 PM

## 2021-10-17 ENCOUNTER — Other Ambulatory Visit: Payer: Self-pay | Admitting: Physician Assistant

## 2021-10-17 ENCOUNTER — Other Ambulatory Visit: Payer: Self-pay | Admitting: Cardiology

## 2021-10-17 DIAGNOSIS — M48061 Spinal stenosis, lumbar region without neurogenic claudication: Secondary | ICD-10-CM

## 2021-10-23 ENCOUNTER — Ambulatory Visit: Payer: Medicare Other

## 2021-10-23 ENCOUNTER — Telehealth: Payer: Self-pay

## 2021-10-23 NOTE — Telephone Encounter (Signed)
TC to patient due to missed appointment, VM left, notified of next appointment day and time

## 2021-10-23 NOTE — Therapy (Deleted)
OUTPATIENT PHYSICAL THERAPY TREATMENT NOTE   Patient Name: [name] MRN: [id number] DOB:08/27/1960, 61 y.o., male Today's Date: 10/23/2021  PCP: [name], MD  REFERRING PROVIDER: [name], MD   END OF SESSION:     Past Medical History:  Diagnosis Date   A-fib [facility])    Chest pain    a. 11/2002 Cath: EF 68%, LM nl, LAD small, nl, LCX nl, RCA tortuous/nl;  b. 08/2011 St Echo: EF 60%, normal contractility, no scar/ischemia.   COPD (chronic obstructive pulmonary disease) (HCC)    Dysrhythmia    Enlarged prostate    Gastrointestinal hemorrhage 08/25/2018   GERD (gastroesophageal reflux disease)    GI bleeding 08/2018   Hypertension    Sickle cell trait (HCC)    Sleep apnea    SVT (supraventricular tachycardia) ([place])    Past Surgical History:  Procedure Laterality Date   ANTERIOR CERVICAL DECOMP/DISCECTOMY FUSION N/A 08/10/2021   Procedure: CERVICAL THREE-FOUR, CERVICAL FOUR-FIVE, CERVICAL FIVE-SIX ANTERIOR CERVICAL DECOMPRESSION/DISCECTOMY FUSION;  Surgeon: Earnie Larsson, MD;  Location: [place];  Service: Neurosurgery;  Laterality: N/A;  3C/RM 20   ATRIAL FIBRILLATION ABLATION     BIOPSY  08/27/2018   Procedure: BIOPSY;  Surgeon: Thornton Park, MD;  Location: [place];  Service: Gastroenterology;;   BIOPSY  08/28/2018   Procedure: BIOPSY;  Surgeon: Thornton Park, MD;  Location: [place];  Service: Gastroenterology;;   COLONOSCOPY WITH PROPOFOL N/A 08/28/2018   Procedure: COLONOSCOPY WITH PROPOFOL;  Surgeon: Thornton Park, MD;  Location: [place];  Service: Gastroenterology;  Laterality: N/A;   ENTEROSCOPY N/A 08/27/2018   Procedure: ENTEROSCOPY;  Surgeon: Thornton Park, MD;  Location: [place];  Service: Gastroenterology;  Laterality: N/A;   GIVENS CAPSULE STUDY N/A 08/28/2018   Procedure: GIVENS CAPSULE STUDY;  Surgeon: Thornton Park, MD;  Location: [place];  Service: Gastroenterology;  Laterality: N/A;   LEFT HEART  CATHETERIZATION WITH CORONARY ANGIOGRAM N/A 03/11/2013   Procedure: LEFT HEART CATHETERIZATION WITH CORONARY ANGIOGRAM;  Surgeon: Jolaine Artist, MD;  Location: [facility] CATH LAB;  Service: Cardiovascular;  Laterality: N/A;   POLYPECTOMY  08/28/2018   Procedure: POLYPECTOMY;  Surgeon: Thornton Park, MD;  Location: [facility] ENDOSCOPY;  Service: Gastroenterology;;   TONSILLECTOMY     TRANSURETHRAL RESECTION OF PROSTATE     Patient Active Problem List   Diagnosis Date Noted   Spinal stenosis of lumbar region 09/26/2021   Acute left lumbar radiculopathy 08/28/2021   H/O excision of lamina of cervical vertebra for decompression of spinal cord 08/28/2021   Constipation 08/18/2021   S/P cervical spinal fusion - C3-6 08/17/2021   Abnormal brain MRI    Cervical spondylosis with myelopathy and radiculopathy 08/10/2021   Cervical disc disorder with radiculopathy of cervical region 05/28/2021   Myofascial pain dysfunction syndrome 05/28/2021   Vitamin D deficiency 05/28/2021   Unstable angina ([place]) 08/18/2020   Restrictive lung disease 05/17/2020   Pulmonary HTN ([place]) 03/06/2020   Ventricular arrhythmia 06/21/2019   AVM (arteriovenous malformation) of small bowel, acquired with hemorrhage 06/16/2019   COPD (chronic obstructive pulmonary disease) ([place])    Anticoagulated    Cervical radiculopathy 03/08/2019   Gastrointestinal hemorrhage with melena 10/02/2018   Adenomatous polyp of transverse colon    Stage 3a chronic kidney disease (CKD) ([place]) 08/25/2018   PAF (paroxysmal atrial fibrillation) ([place]) 08/25/2018   Dizziness 03/30/2018   Referred otalgia of left ear 12/23/2017   Sensorineural hearing loss (SNHL) of both ears 12/23/2017   Other dysphagia 10/23/2016  Pericarditis, acute 10/17/2016   S/P ablation of atrial fibrillation 10/17/2016   Hematochezia 08/16/2016   Normocytic anemia 08/16/2016   Left ventricular hypertrophy 04/17/2016   History of colon polyps 02/05/2016   Gastroesophageal  reflux disease without esophagitis 09/27/2015   Cough 08/04/2015   Cervicalgia 06/21/2015   Compression of left suprascapular nerve 06/21/2015   Memory loss 06/21/2015   Numbness and tingling in left arm 06/21/2015   Increased frequency of urination 04/25/2015   ED (erectile dysfunction) of organic origin 03/25/2015   OAB (overactive bladder) 08/20/2013   Obstructive sleep apnea (adult) (pediatric) 03/21/2013   HTN (hypertension) 03/10/2013   Nonspecific abnormal electrocardiogram (ECG) (EKG) 03/10/2013   Elevated PSA 11/06/2012   Bilateral inguinal hernia, without obstruction or gangrene, not specified as recurrent 08/28/2012   Multinodular goiter 02/25/2012   Prediabetes 05/05/2011   Mild persistent asthma without complication 86/76/1950   Mixed hyperlipidemia 05/06/2009   Nontoxic uninodular goiter 12/16/2007   Benign localized prostatic hyperplasia without lower urinary tract symptoms (LUTS) 09/15/2007   Impaired fasting glucose 06/11/2004    REFERRING DIAG: general debility, neck pain post-op cervical fusion  THERAPY DIAG: general debility, neck pain post-op cervical fusion   Rationale for Evaluation and Treatment Rehabilitation  PERTINENT HISTORY: Pt is a 61 yr old male with hx of SVT, interstitial lung disease, sickle cell syndrome, CKD- last Cr 1.50, Afib; migraine; Cervical and lumbar radiculopathy and OA of knee-as well as fibromyalgia.  Also has depression and HTN; chronic fatigue syndrome - on Eliquis, and gout. 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  Also needs L THR- leg will just give out.  Pt here for f/u on Cervical myelopathy.    He had neck surgery in May 2023- still having pain form neck into arms. Surgery was C3-C6 anterior cervical discectomy with fusion 08/10/21-    He saw Neurology- dx'd with L lumbar radiculopathy- known hx of DDD of lumbar spine Started on Trileptal- 300 mg BID and continue gabapentin- seen 08/28/21.  Due to  post surgical confusion- where he was readmitted to hospital, they attempted Fluoro based LP- multiple itmes on 08/17/21- since then, having pain in LLE and L buttock since then.    Still pretty sore in arms and neck   Has to stand since they "hit a nerve" when they did lumbar puncture. And tried all 5 levels of lumbar spine and was unable to get into to do LP.    Michela Pitcher was hearing everything that was being said, but couldn't talk- when went to hospital with confusion.      Trileptal hasn't made any difference- taking 300 mg BID And can take Gabapentin-  Have only made him sleepy.      Just picked up tramadol this AM. Has taken 1 pill - got Rx from Dr Krista Blue yesterday.  Tramadol helped some- first time got relief was this AM.    Didn't have to sign contract with Dr Krista Blue.    Sitting is what causes the most pain-esp if sits too long- can get bad in as little as 2 minutes.  And walking- also sets it off.    Getting out trying to walk- LLE will feel like it's tingling- and pain in L buttocks will increase   Before LP, Gabapentin took the edge off. But didn't "fix" the pain.    L buttock pain is Burning. In crease of L buttock and radiates down into back of L calf  PRECAUTIONS: cervical fusion May 2023 C3-C6  anterior cervical discectomy with fusion  SUBJECTIVE: R shoulder and neck pain improved, still unable to reach [facility]  PAIN:  Are you having pain? yes, 5/10, R shoulder UE, worse with OH and cross body reaching, better with rest   OBJECTIVE:  DIAGNOSTIC FINDINGS:    IMPRESSION:    MRI lumbar spine (without) demonstrating: - At L4-5: Pseudo disc bulging, facet hypertrophy with moderate to severe spinal stenosis and moderate to severe bilateral foraminal stenosis. - At L1-2: Short pedicles, facet hypertrophy resulting in mild spinal stenosis and moderate bilateral foraminal stenosis. - Additional degenerative changes and foraminal stenosis as above. - Compared to MRI from 05/22/2015,  mild progression of degenerative changes, spinal stenosis and foraminal stenosis. - Incidentally noted, left renal cyst measuring 1.5 cm, slightly larger than comparison CT abdomen pelvis from 04/23/2019.     INTERPRETING PHYSICIAN:  Penni Bombard, MD Certified in Neurology, Neurophysiology and Neuroimaging   PATIENT SURVEYS:  FOTO TBD   SCREENING FOR RED FLAGS: Bowel or bladder incontinence: No     COGNITION:           Overall cognitive status: Within functional limits for tasks assessed                          SENSATION: Not tested   MUSCLE LENGTH: Hamstrings: Right 70 deg; Left 70 deg     SENSATION: Not tested   CERVICAL ROM:    Active ROM A/PROM (deg) 09/25/2021  Flexion 25%  Extension 0%  Right lateral flexion 25%  Left lateral flexion 25%  Right rotation 25%  Left rotation 25%   (Blank rows = not tested)   UE ROM:   Active ROM Right 09/25/2021 Left 09/25/2021  Shoulder flexion 90d 90d  Shoulder extension      Shoulder abduction      Shoulder adduction      Shoulder extension      Shoulder internal rotation      Shoulder external rotation      Elbow flexion WFL WFL  Elbow extension [facility] WFL  Wrist flexion      Wrist extension      Wrist ulnar deviation      Wrist radial deviation      Wrist pronation      Wrist supination       (Blank rows = not tested)   UE MMT: Not tested   MMT Right 09/25/2021 Left 09/25/2021  Shoulder flexion      Shoulder extension      Shoulder abduction      Shoulder adduction      Shoulder extension      Shoulder internal rotation      Shoulder external rotation      Middle trapezius      Lower trapezius      Elbow flexion      Elbow extension      Wrist flexion      Wrist extension      Wrist ulnar deviation      Wrist radial deviation      Wrist pronation      Wrist supination      Grip strength       (Blank rows = not tested)   CERVICAL SPECIAL TESTS:  Not tested due to surgery     FUNCTIONAL  TESTS:  Deferred due to surgery     POSTURE: elevated Shoulders    PALPATION: deferred   LUMBAR ROM:  Active  A/PROM  eval  Flexion 50%  Extension 0%  Right lateral flexion 50%  Left lateral flexion 50%  Right rotation    Left rotation     (Blank rows = not tested)   LOWER EXTREMITY ROM:   WFL for ADLs   Active  Right eval Left eval  Hip flexion      Hip extension      Hip abduction      Hip adduction      Hip internal rotation      Hip external rotation      Knee flexion      Knee extension      Ankle dorsiflexion      Ankle plantarflexion      Ankle inversion      Ankle eversion       (Blank rows = not tested)   LOWER EXTREMITY MMT:  WFL (testing limited by pain LLE)   MMT Right eval Left eval  Hip flexion      Hip extension      Hip abduction      Hip adduction      Hip internal rotation      Hip external rotation      Knee flexion      Knee extension      Ankle dorsiflexion      Ankle plantarflexion      Ankle inversion      Ankle eversion       (Blank rows = not tested)   LUMBAR SPECIAL TESTS:  Deferred due to pain     FUNCTIONAL TESTS:  Not tested due to pain   GAIT: Distance walked: 86f x2 Assistive device utilized: None Level of assistance: Complete Independence       TODAY'S TREATMENT  [facility] Adult PT Treatment:                                                DATE: 10/16/21 Therapeutic Exercise: UBE L1 6 min Upper trap stretch R 30s x3 Levator stretch R 30s x3 Supine chin tuck over towel roll 3x10 3s hold Supine OH flexion 10x w/cane(limited by R shoulder pain) Supine protraction 15x w/cane(unable to perform due to R shoulder pain) Shoulder shrugs 10x Scapular retractions 10x      PATIENT EDUCATION:  Education details: Discussed eval findings, rehab rationale and POC and patient is in agreement  Person educated: Patient Education method: Explanation Education comprehension: needs further education     HOME EXERCISE  PROGRAM: Access Code: [email or web address]: [email or web address] Date: 10/16/2021 Prepared by: JSharlynn Oliphant Exercises - Seated Upper Trap Stretch  - 2 x daily - 7 x weekly - 1 sets - 3 reps - 30s hold - Seated Levator Scapulae Stretch  - 2 x daily - 7 x weekly - 1 sets - 3 reps - 30s hold - Supine Cervical Retraction with Towel  - 2 x daily - 7 x weekly - 3 sets - 10 reps - 3s hold - Supine Shoulder Flexion AAROM with Dowel  - 2 x daily - 7 x weekly - 2 sets - 15 reps - Supine Shoulder Protraction with Dowel  - 2 x daily - 7 x weekly - 2 sets - 15 reps   ASSESSMENT:   CLINICAL IMPRESSION: Patient returns for first f/u session.  Per Dr. PAnnette Stable patient cleared  for cervical OPPT, per Dr. Krista Blue radiculopathy will be addressed by pain management.  Sitting tolerance remains limited by radicular symptoms which appear insidiously.  Today's session established a HEP for stretching, postural correction, R shoulder mobility and aerobic work.  Limited tolerance to R shoulder tasks due to pain.     OBJECTIVE IMPAIRMENTS decreased activity tolerance, decreased coordination, decreased knowledge of condition, decreased mobility, decreased ROM, increased muscle spasms, impaired UE functional use, improper body mechanics, postural dysfunction, and pain.    REHAB POTENTIAL: Fair based on history of symptoms   CLINICAL DECISION MAKING: Evolving/moderate complexity   EVALUATION COMPLEXITY: Moderate     GOALS: Goals reviewed with patient? No   SHORT TERM GOALS: Target date: 10/09/2021   Patient to demonstrate independence in HEP  Baseline: TBD; 10/15/21 Access Code: [email or web address] Goal status: INITIAL   2.  Obtain FOTO Baseline: TBD Goal status: INITIAL   3.  Obtain 5x STS Baseline: TBD Goal status: deferred by MD       LONG TERM GOALS: Target date:11/25/2021   6/10 worst pain Baseline: 10/10 worst pain Goal status: INITIAL   2.  Increased AROM C-spine to 50% Baseline: Active Goal  status: INITIAL   3.  120d B shoulder flexion Baseline: 90d B shoulder flexion Goal status: INITIAL   4.  Patient able to stand for 5 minutes Baseline: Unable to stand for 5 min. Goal status: Deferred by MD         PLAN: PT FREQUENCY: 2x/week   PT DURATION: 4 weeks   PLANNED INTERVENTIONS: Therapeutic exercises, Therapeutic activity, Neuromuscular re-education, Balance training, Gait training, Patient/Family education, Joint mobilization, Stair training, Aquatic Therapy, and Re-evaluation.   PLAN FOR NEXT SESSION: Posture exercises, ROM R shoulder and c-spine, strengthening and flexibility tasks.     Lanice Shirts, PT 10/23/2021, 1:12 PM

## 2021-10-25 ENCOUNTER — Ambulatory Visit
Admission: RE | Admit: 2021-10-25 | Discharge: 2021-10-25 | Disposition: A | Discharge status: Home / Self Care | Payer: Medicare Other | Source: Ambulatory Visit | Attending: Physician Assistant | Admitting: Physician Assistant

## 2021-10-25 DIAGNOSIS — M48061 Spinal stenosis, lumbar region without neurogenic claudication: Secondary | ICD-10-CM

## 2021-10-25 MED ORDER — IOPAMIDOL (ISOVUE-M 300) INJECTION 61%
1.0000 mL | Freq: Once | INTRAMUSCULAR | Status: AC | PRN
  Administered 2021-10-25: 1 mL via INTRATHECAL

## 2021-10-25 MED ORDER — METHYLPREDNISOLONE ACETATE 40 MG/ML INJ SUSP (RADIOLOG
80.0000 mg | Freq: Once | INTRAMUSCULAR | Status: AC
  Administered 2021-10-25: 80 mg via EPIDURAL

## 2021-10-25 NOTE — Discharge Instructions (Signed)

## 2021-10-30 ENCOUNTER — Ambulatory Visit: Payer: Medicare Other

## 2021-11-02 ENCOUNTER — Encounter: Payer: Self-pay | Admitting: Physical Medicine and Rehabilitation

## 2021-11-02 ENCOUNTER — Encounter
Payer: Medicare Other | Attending: Physical Medicine and Rehabilitation | Admitting: Physical Medicine and Rehabilitation

## 2021-11-02 VITALS — BP 122/89 | HR 57 | Ht 71.0 in | Wt 199.0 lb

## 2021-11-02 DIAGNOSIS — M7918 Myalgia, other site: Secondary | ICD-10-CM

## 2021-11-02 DIAGNOSIS — M48062 Spinal stenosis, lumbar region with neurogenic claudication: Secondary | ICD-10-CM | POA: Diagnosis present

## 2021-11-02 DIAGNOSIS — M5106 Intervertebral disc disorders with myelopathy, lumbar region: Secondary | ICD-10-CM | POA: Diagnosis present

## 2021-11-02 DIAGNOSIS — M4800 Spinal stenosis, site unspecified: Secondary | ICD-10-CM | POA: Diagnosis present

## 2021-11-02 MED ORDER — HYDROCODONE-ACETAMINOPHEN 5-325 MG PO TABS: 1.0000 | ORAL_TABLET | ORAL | 0 refills | Status: DC | PRN

## 2021-11-02 MED ORDER — PREDNISONE 10 MG (21) PO TBPK: ORAL_TABLET | ORAL | 0 refills | Status: DC

## 2021-11-02 MED ORDER — CYCLOBENZAPRINE HCL 10 MG PO TABS
10.0000 mg | ORAL_TABLET | Freq: Three times a day (TID) | ORAL | 5 refills | Status: DC | PRN
Start: 2021-11-02 — End: 2022-08-02

## 2021-11-02 NOTE — Addendum Note (Signed)
Addended by: Courtney Heys on: 11/02/2021 12:15 PM   Modules accepted: Orders

## 2021-11-02 NOTE — Progress Notes (Addendum)
Pt is a 61 yr old male with hx of SVT, interstitial lung disease, sickle cell syndrome, CKD- last Cr 1.50, Afib; migraine; Cervical and lumbar radiculopathy and OA of knee-as well as fibromyalgia.  Also has depression and HTN; chronic fatigue syndrome - on Eliquis, and gout. 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  Also needs L THR- leg will just give out.  Pt here for f/u on Cervical myelopathy.   He had neck surgery in May 2023- still having pain form neck into arms. Surgery was C3-C6 anterior cervical discectomy with fusion 08/10/21-    He saw Neurology- dx'd with L lumbar radiculopathy- known hx of DDD of lumbar spine Started on Trileptal- 300 mg BID and continue gabapentin- seen 08/28/21.  Due to post surgical confusion- where he was readmitted to hospital, they attempted Fluoro based LP- multiple itmes on 08/17/21- since then, having pain in LLE and L buttock since then.   Here for f/u on Cervical myelopathy and trigger point injections.   Still having severe neuropathic pain- despite tramadol 50 mg BID; gabapentin 900 mg TID and added on Trileptal 300 mg TID ( Neurology)  Sent to Dr Davy Pique for injections?actually saw [place].   Pain becoming so unbearable.  Went to [place] 1 week ago- did epidural steroid injection- eased things a little.  Has been trying to take tramadol 2 tabs at a time- still not helping.    Flares up when drives and then makes it impossible to move/act up. Goes from crease in buttocks down LLE to toes.  Set off ever since they tried to get LP- it's been AWFUL!  Lumbar MRI- 09/04/21 L4/5 moderate to severe spinal stenosis and severe B/L foraminal stenosis L1-2 mild spinal stenosis and moderate B/L foraminal stenosis.  Hasn't seen [facility] themselves- only Dr Annette Stable for neck.   If walks too far (maybe 1 block before it spikes) or sits too long- pain sky rockets- really impacting his function.  Propping feet up and  laying down helps some.   Exam: Can barely sit- has TTP just lateral to crease of buttocks- not piriformis TTP Rocking back and forth and cannot sit/stand  Plan: Take disc of MRI (get from [facility]) to appointment with Dr Annette Stable-  2. Call Dr Marchelle Folks office to get appointment to discuss lumbar surgery.  3. Will change Tramadol to Norco/Hydrocodone- 1 tab q4 hours as needed- max 5 tabs/day. Til gets surgery on back.   4. Don't take tramadol at same time as Hydrocodone.    5. Wants to wait on trigger point injections- will not do today since back is his main issue, not cervical issues.   6. Strongly suggest Tempurpedic seat rest- found online for $89- might help you fly/drive- can even use on couch at home.   7. Went over how to use tennis ball- sit on it ON tempurpedic cushion to help relax associated muscle spasms  8. Try Flexeril?Cyclobenzaprine 10 mg 3x/day as needed for muscle spasms associated  9. F/U in 3 months- but call me at a week-2 weeks to see how things going- and see if need to increase pain meds  10. Have discussed with Dr Annette Stable- have also asked - waiting to hear response, if Dr Annette Stable will allow a steroid dose pack. Heard back from Dr Annette Stable- 60 mg daily x 3 days, then 40 mg daily x 3 days; then 20 mg daily x 3 days, then 10 mg daily x3- days- is not diabetic  I spent a total of  34  minutes on total care today- >50% coordination of care- due to calling Dr Annette Stable- educating pt on his issue- spinal stenosis- and educating on Norco and to call if it's not working.

## 2021-11-02 NOTE — Patient Instructions (Addendum)
Plan: Take disc of MRI (get from [facility] place) to appointment with Dr Annette Stable-  2. Call Dr Marchelle Folks office to get appointment to discuss lumbar surgery.  3. Will change Tramadol to Norco/Hydrocodone- 1 tab q4 hours as needed- max 5 tabs/day. Til gets surgery on back.   4. Don't take tramadol at same time as Hydrocodone.    5. Wants to wait on trigger point injections- will not do today since back is his main issue, not cervical issues.   6. Strongly suggest Tempurpedic seat rest- found online for $89- might help you fly/drive- can even use on couch at home.   7. Went over how to use tennis ball- sit on it ON tempurpedic cushion to help relax associated muscle spasms  8. Try Flexeril?Cyclobenzaprine 10 mg 3x/day as needed for muscle spasms associated  9. F/U in 3 months-call at 1-2 weeks to let me know how things going  10. Have discussed with Dr Annette Stable- have also asked - waiting to hear response, if Dr Annette Stable will allow a steroid dose pack.

## 2021-12-31 ENCOUNTER — Encounter
Payer: Medicare Other | Attending: Physical Medicine and Rehabilitation | Admitting: Physical Medicine and Rehabilitation

## 2021-12-31 DIAGNOSIS — M7918 Myalgia, other site: Secondary | ICD-10-CM | POA: Insufficient documentation

## 2022-01-16 ENCOUNTER — Ambulatory Visit: Payer: Medicare Other | Admitting: Adult Health

## 2022-01-16 NOTE — Progress Notes (Deleted)
[facility] [address] [place]. [place] [address] [phone]       OFFICE FOLLOW UP NOTE  [name] Date of Birth:  1960/10/02 Medical Record Number:  [id number]   Reason for visit:     SUBJECTIVE:   CHIEF COMPLAINT:  No chief complaint on file.   HPI:  [name] is a 61 year-old male, seen in request by his primary care physician Dr. Darron Cox, [name] for evaluation of left low back pain, radiating pain into left lower extremity, initial evaluation was on Aug 28 2021 with Dr. Krista Cox   I reviewed and summarized the referring note.  Past medical history Hypertension Paroxysmal atrial fibrillation Chronic kidney disease History of cervical radiculopathy status post cervical decompression on Aug 10, 2021,   Consult visit 08/28/2021 Dr. Krista Cox: Patient has a history of cervical radiculopathy, presenting with worsening neck pain, radiating pain to bilateral upper extremity, right more than left, mainly involving bilateral fourth and fifth fingers, he eventually underwent C3-4, C4-5, C5-6 anterior cervical discectomy with interbody fusion on Aug 10 2021.   Surgery did help his neck pain, left arm numbness and weakness, he was readmitted to hospital on Aug 16, 2021 for acute onset of confusion, his sister was there from out of town helping care for him after surgery, then he was found to be confused  He is on polypharmacy for his pain, this including Norco 5/325 mg 5 to 6 tablets a day, tramadol 50 mg twice a day, gabapentin 300 mg 4 times a day, also has severe constipation  Initial CT head CT angiogram of head and neck was negative Laboratory MRI of brain Aug 17, 2021, there was 1 cm ringlike lesion with adjacent subtle leptomeningeal signal involving the left occipital lobe, worried about the possibility of postoperative infection, he had fluoroscopy guided lumbar puncture on Aug 17, 2021, multiple attempts at L3-4, L2-3, L1-2 without  success  Patient reported that during lumbar puncture, he noticed sharp radiating pain to his left buttock, lateral thigh and calf region, which has persisted even now, persistent low degree of pain, intermittent very sharp radiating pain,   Update October 10, 2021 Dr. Krista Cox Patient return for electrodiagnostic study today, which showed evidence of mild chronic bilateral lumbosacral radiculopathy  Personally reviewed MRI of lumbar spine September 06, 2021: L4-5 pseudodisc bulging, facet hypertrophy with moderate to severe spinal stenosis, moderate to severe bilateral foraminal stenosis  Multilevel degenerative changes  CT pelvic with contrast showed no significant abnormality  Patient complains of significant left low back pain, radiating pain to left hip, buttock region, lateral thigh, calf, and lateral 3 toes  He denies bowel and bladder incontinence  He was seen by [facility], on schedule for epidural injection in August  Despite polypharmacy, including tramadol 50 mg twice daily, gabapentin 300 mg 3 tablets 3 times a day, he complains of uncontrollable pain, difficult to function   Update 01/17/2022 [name]: Patient returns for follow-up after prior visit with Dr. Krista Cox 3 months ago.  He has remained on gabapentin and Trileptal as well as recently added replaced tramadol with hydrocodone by PMR Dr. Beverlee Cox.  Did receive epidural steroid injection but denies any great benefit.         ROS:   14 system review of systems performed and negative with exception of ***  PMH:  Past Medical History:  Diagnosis Date   A-fib ([place])    Chest pain    a. 11/2002 Cath: EF  68%, LM nl, LAD small, nl, LCX nl, RCA tortuous/nl;  b. 08/2011 St Echo: EF 60%, normal contractility, no scar/ischemia.   COPD (chronic obstructive pulmonary disease) (HCC)    Dysrhythmia    Enlarged prostate    Gastrointestinal hemorrhage 08/25/2018   GERD (gastroesophageal reflux disease)    GI bleeding  08/2018   Hypertension    Sickle cell trait (HCC)    Sleep apnea    SVT (supraventricular tachycardia) (HCC)     PSH:  Past Surgical History:  Procedure Laterality Date   ANTERIOR CERVICAL DECOMP/DISCECTOMY FUSION N/A 08/10/2021   Procedure: CERVICAL THREE-FOUR, CERVICAL FOUR-FIVE, CERVICAL FIVE-SIX ANTERIOR CERVICAL DECOMPRESSION/DISCECTOMY FUSION;  Surgeon: Bobby Larsson, MD;  Location: [facility];  Service: Neurosurgery;  Laterality: N/A;  3C/RM 20   ATRIAL FIBRILLATION ABLATION     BIOPSY  08/27/2018   Procedure: BIOPSY;  Surgeon: Thornton Park, MD;  Location: [facility];  Service: Gastroenterology;;   BIOPSY  08/28/2018   Procedure: BIOPSY;  Surgeon: Thornton Park, MD;  Location: [facility];  Service: Gastroenterology;;   COLONOSCOPY WITH PROPOFOL N/A 08/28/2018   Procedure: COLONOSCOPY WITH PROPOFOL;  Surgeon: Thornton Park, MD;  Location: [facility];  Service: Gastroenterology;  Laterality: N/A;   ENTEROSCOPY N/A 08/27/2018   Procedure: ENTEROSCOPY;  Surgeon: Thornton Park, MD;  Location: [facility];  Service: Gastroenterology;  Laterality: N/A;   GIVENS CAPSULE STUDY N/A 08/28/2018   Procedure: GIVENS CAPSULE STUDY;  Surgeon: Thornton Park, MD;  Location: [facility];  Service: Gastroenterology;  Laterality: N/A;   LEFT HEART CATHETERIZATION WITH CORONARY ANGIOGRAM N/A 03/11/2013   Procedure: LEFT HEART CATHETERIZATION WITH CORONARY ANGIOGRAM;  Surgeon: Jolaine Artist, MD;  Location: [facility] CATH LAB;  Service: Cardiovascular;  Laterality: N/A;   POLYPECTOMY  08/28/2018   Procedure: POLYPECTOMY;  Surgeon: Thornton Park, MD;  Location: [facility] ENDOSCOPY;  Service: Gastroenterology;;   TONSILLECTOMY     TRANSURETHRAL RESECTION OF PROSTATE      Social History:  Social History   Socioeconomic History   Marital status: Divorced    Spouse name: Not on file   Number of children: Not on file   Years of education: Not on file   Highest education level: Not on file   Occupational History   Not on file  Tobacco Use   Smoking status: Never   Smokeless tobacco: Never  Vaping Use   Vaping Use: Never used  Substance and Sexual Activity   Alcohol use: No   Drug use: Never   Sexual activity: Not on file  Other Topics Concern   Not on file  Social History Narrative   Lives in [place] with dtr.   Social Determinants of Health   Financial Resource Strain: Not on file  Food Insecurity: Not on file  Transportation Needs: Not on file  Physical Activity: Not on file  Stress: Not on file  Social Connections: Not on file  Intimate Partner Violence: Not on file    Family History:  Family History  Problem Relation Age of Onset   CAD Mother        died in her 30's - MI   Multiple myeloma Brother    Sickle cell anemia Father        died @ 67   Gastric cancer Neg Hx     Medications:   Current Outpatient Medications on File Prior to Visit  Medication Sig Dispense Refill   acetaminophen (TYLENOL) 325 MG tablet Take 650 mg by mouth every 6 (six) hours as needed for  moderate pain.     amLODipine (NORVASC) 5 MG tablet Take 1 tablet (5 mg total) by mouth daily. 30 tablet 0   apixaban (ELIQUIS) 5 MG TABS tablet Take 1 tablet (5 mg total) by mouth 2 (two) times daily. Take Eliquis 5 mg twice a day one week after GI bleed.  Do not restart Xarelto. (Patient taking differently: Take 5 mg by mouth 2 (two) times daily.) 60 tablet 0   atorvastatin (LIPITOR) 10 MG tablet Take 10 mg by mouth daily.     cetirizine (ZYRTEC) 10 MG tablet Take 10 mg by mouth daily as needed for allergies.      Colchicine 0.6 MG CAPS Take 0.6 mg by mouth 2 (two) times daily as needed (gout flare).     cyclobenzaprine (FLEXERIL) 10 MG tablet Take 1 tablet (10 mg total) by mouth 3 (three) times daily as needed for muscle spasms. 90 tablet 5   diclofenac (VOLTAREN) 75 MG EC tablet TAKE 1 TABLET(75 MG) BY MOUTH TWICE DAILY     EMGALITY 120 MG/ML SOAJ Inject 120 mg into the skin every 30  (thirty) days.     famotidine (PEPCID) 20 MG tablet Take 40 mg by mouth daily as needed for heartburn or indigestion.     ferrous sulfate 325 (65 FE) MG tablet Take 1 tablet (325 mg total) by mouth daily with breakfast. 60 tablet 0   fluticasone (FLONASE) 50 MCG/ACT nasal spray Place 1 spray into both nostrils daily as needed for allergies or rhinitis.     Fluticasone-Salmeterol (ADVAIR) 250-50 MCG/DOSE AEPB Inhale 1 puff into the lungs 2 (two) times daily as needed (wheezing).     gabapentin (NEURONTIN) 300 MG capsule Take 3 capsules (900 mg total) by mouth 3 (three) times daily. 270 capsule 6   guaiFENesin (MUCINEX) 600 MG 12 hr tablet Take 600 mg by mouth daily as needed for to loosen phlegm.     HYDROcodone-acetaminophen (NORCO/VICODIN) 5-325 MG tablet Take 1-2 tablets by mouth every 4 (four) hours as needed for moderate pain (max 5 tabs/day). 150 tablet 0   hydrocortisone (ANUSOL-HC) 25 MG suppository Place 1 suppository (25 mg total) rectally 2 (two) times daily. For 7 days 14 suppository 0   isosorbide mononitrate (IMDUR) 30 MG 24 hr tablet Take 30 mg by mouth daily.     lidocaine (XYLOCAINE) 2 % jelly Apply small amounts to anus as needed for pain up to tid for 2 weeks.     linaclotide (LINZESS) 145 MCG CAPS capsule Take 145 mcg by mouth daily as needed (Constipation).     LINZESS 72 MCG capsule Take 72 mcg by mouth every morning.     losartan (COZAAR) 25 MG tablet Take 25 mg by mouth daily.     nitroGLYCERIN (NITROSTAT) 0.4 MG SL tablet Place 1 tablet (0.4 mg total) under the tongue every 5 (five) minutes as needed for chest pain. 20 tablet 1   NON FORMULARY CPAP at bedtime     ondansetron (ZOFRAN) 8 MG tablet Take 8 mg by mouth every 8 (eight) hours as needed for nausea or vomiting.      Oxcarbazepine (TRILEPTAL) 300 MG tablet Take 1 tablet (300 mg total) by mouth in the morning, at noon, and at bedtime. 90 tablet 6   pantoprazole (PROTONIX) 40 MG tablet Take 40 mg by mouth daily.      polyethylene glycol (MIRALAX / GLYCOLAX) 17 g packet Take 17 g by mouth daily. (Patient taking differently: Take 17 g by mouth  daily as needed for mild constipation.) 14 each 0   predniSONE (STERAPRED UNI-PAK 21 TAB) 10 MG (21) TBPK tablet 60 mg daily x 3 days, then 40 mg daily x 3 days, then 20 mg daily x 3 days, then 10 mg daily x 3 days, then stop 39 tablet 0   sildenafil (REVATIO) 20 MG tablet Take 60-100 mg by mouth daily as needed (ED).     sotalol (BETAPACE) 80 MG tablet TAKE 1 TABLET(80 MG) BY MOUTH EVERY 12 HOURS     SUMAtriptan (IMITREX) 100 MG tablet Take 100 mg by mouth as directed. Take one tablet at onset, then an additional tablet every 12 hours as needed for migraine     traMADol (ULTRAM) 50 MG tablet Take 50 mg by mouth every 6 (six) hours as needed for moderate pain.     traMADol (ULTRAM) 50 MG tablet Take 1 tablet (50 mg total) by mouth every 12 (twelve) hours as needed. 60 tablet 1   triamterene-hydrochlorothiazide (DYAZIDE) 37.5-25 MG capsule Take 1 each (1 capsule total) by mouth every morning.     No current facility-administered medications on file prior to visit.    Allergies:  No Known Allergies    OBJECTIVE:  Physical Exam  There were no vitals filed for this visit. There is no height or weight on file to calculate BMI. No results found.   General: well developed, well nourished, seated, in no evident distress Head: head normocephalic and atraumatic.   Neck: supple with no carotid or supraclavicular bruits Cardiovascular: regular rate and rhythm, no murmurs Musculoskeletal: no deformity Skin:  no rash/petichiae Vascular:  Normal pulses all extremities   Neurologic Exam Mental Status: Awake and fully alert. Oriented to place and time. Recent and remote memory intact. Attention span, concentration and fund of knowledge appropriate. Mood and affect appropriate.  Cranial Nerves: Pupils equal, briskly reactive to light. Extraocular movements full without  nystagmus. Visual fields full to confrontation. Hearing intact. Facial sensation intact. Face, tongue, palate moves normally and symmetrically.  Motor: Normal bulk and tone. Normal strength in all tested extremity muscles Sensory.: intact to touch , pinprick , position and vibratory sensation.  Coordination: Rapid alternating movements normal in all extremities. Finger-to-nose and heel-to-shin performed accurately bilaterally. Gait and Station: Arises from chair without difficulty. Stance is normal. Gait demonstrates normal stride length and balance without use of AD. Tandem walk and heel toe without difficulty.  Reflexes: 1+ and symmetric. Toes downgoing.         ASSESSMENT/PLAN: [name] is a 61 y.o. year old male   History of cervical decompression on Aug 10, 2021 Acute onset of radiating pain from left buttock to left lower extremity, Lumbar stenosis at L4-5             MRI of lumbar spine showed moderate to severe spinal stenosis L4-5, variable degree of foraminal narrowing,             EMG nerve conduction study mild chronic bilateral lumbar radiculopathy involving bilateral L4-5 S1,             Significant neuropathic pain despite polypharmacy including tramadol 50 mg twice a day, gabapentin up to 300 mg 3 tablets 3 times a day, add on Trileptal 300 mg 3 times a day             He is already under the care of pain management at [facility] Dr. Davy Pique, we will communicate with Dr. Davy Pique, hope to move up  appointment for better pain control      Follow up in *** or call earlier if needed   CC:  PCP: [name], MD    I spent *** minutes of face-to-face and non-face-to-face time with patient.  This included previsit chart review, lab review, study review, order entry, electronic health record documentation, patient education regarding ***   Frann Rider, AGNP-BC  [facility] [address] [place], [place]  [address]  Phone [phone] Fax [phone] Note: This document was prepared with digital dictation and possible smart phrase technology. Any transcriptional errors that result from this process are unintentional.

## 2022-01-17 ENCOUNTER — Encounter: Payer: Self-pay | Admitting: Adult Health

## 2022-01-17 ENCOUNTER — Ambulatory Visit: Payer: Medicare Other | Admitting: Adult Health

## 2022-05-23 ENCOUNTER — Telehealth: Payer: Self-pay

## 2022-05-23 MED ORDER — HYDROCODONE-ACETAMINOPHEN 5-325 MG PO TABS: 1.0000 | ORAL_TABLET | ORAL | 0 refills | Status: DC | PRN

## 2022-05-23 NOTE — Telephone Encounter (Signed)
Filled  Written  ID  Drug  QTY  Days  Prescriber  RX #  Dispenser  Refill  Daily Dose*  Pymt Type  PMP  11/02/2021 11/02/2021 1  Hydrocodone-Acetamin 5-325 Mg 150.00 30 [facility] Lov [id number] [facility] ([id number]) 0/0 25.00 MME Medicare [place] 10/26/2021 09/20/2021 1  Tramadol Hcl 50 Mg Tablet 60.00 30 Cecelia Byars [id number] [facility] ([id number]) 1/1 20.00 MME Medicare [place]

## 2022-05-24 ENCOUNTER — Other Ambulatory Visit: Payer: Self-pay | Admitting: *Deleted

## 2022-05-24 MED ORDER — HYDROCODONE-ACETAMINOPHEN 7.5-325 MG PO TABS: 1.0000 | ORAL_TABLET | Freq: Four times a day (QID) | ORAL | 0 refills | Status: DC | PRN

## 2022-05-24 NOTE — Telephone Encounter (Signed)
Patient did call back to make a follow up with you, in April. However he has not scheduled with Dr. Trenton Gammon. Patient as been advised to call Dr. Trenton Gammon and make the  appointment.

## 2022-05-24 NOTE — Telephone Encounter (Signed)
error 

## 2022-05-24 NOTE — Telephone Encounter (Signed)
Patient's insurance did approve Hydrocodone 5-325. Patient been informed Called pharm to cx new rx sent. [name] (Key: [facility]) [id number] HYDROcodone-Acetaminophen 5-325MG tablets Status: PA Response - ApprovedCreated: February 16th, 2024 [phone]: February 16th, 2024

## 2022-05-24 NOTE — Telephone Encounter (Signed)
PA for Hydrocodone 5-325 submitted  Contacted patient who states he called his insurance and they will only cover  Hydrocodone 7.5-325 or 7.5-200.

## 2022-07-22 ENCOUNTER — Encounter: Payer: Self-pay | Admitting: *Deleted

## 2022-08-02 ENCOUNTER — Encounter
Payer: Medicare Other | Attending: Physical Medicine and Rehabilitation | Admitting: Physical Medicine and Rehabilitation

## 2022-08-02 ENCOUNTER — Encounter: Payer: Self-pay | Admitting: Physical Medicine and Rehabilitation

## 2022-08-02 VITALS — BP 128/90 | HR 62 | Ht 71.0 in | Wt 193.0 lb

## 2022-08-02 DIAGNOSIS — M5416 Radiculopathy, lumbar region: Secondary | ICD-10-CM

## 2022-08-02 DIAGNOSIS — Z79891 Long term (current) use of opiate analgesic: Secondary | ICD-10-CM | POA: Diagnosis present

## 2022-08-02 DIAGNOSIS — Z5181 Encounter for therapeutic drug level monitoring: Secondary | ICD-10-CM | POA: Diagnosis present

## 2022-08-02 DIAGNOSIS — M5106 Intervertebral disc disorders with myelopathy, lumbar region: Secondary | ICD-10-CM | POA: Diagnosis present

## 2022-08-02 DIAGNOSIS — M7541 Impingement syndrome of right shoulder: Secondary | ICD-10-CM | POA: Diagnosis present

## 2022-08-02 DIAGNOSIS — G894 Chronic pain syndrome: Secondary | ICD-10-CM | POA: Diagnosis present

## 2022-08-02 DIAGNOSIS — M7542 Impingement syndrome of left shoulder: Secondary | ICD-10-CM | POA: Insufficient documentation

## 2022-08-02 MED ORDER — CYCLOBENZAPRINE HCL 10 MG PO TABS: 10.0000 mg | ORAL_TABLET | Freq: Three times a day (TID) | ORAL | 5 refills | Status: DC | PRN

## 2022-08-02 NOTE — Patient Instructions (Signed)
Pt is a 62 yr old male with hx of SVT, interstitial lung disease, sickle cell syndrome, CKD- last Cr 1.50, Afib; migraine; Cervical and lumbar radiculopathy and OA of knee-as well as fibromyalgia.  Also has depression and HTN; chronic fatigue syndrome - on Eliquis, and gout. 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  Also needs L THR- leg will just give out.  Pt here for f/u on Cervical myelopathy. 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   2. Doesn't need refill for Norco today, but will con't it.  Last refill 05/26/22.   3. Didn't respond to Gabapentin- stopped it.   4. Try Tennis ball-  4-5 minutes on low back against wall; 10 minutes on L buttock-  keep one in car, and one at home and one for work.   5. UDS  since has been 7 months-   6. F/U in 3 months- wait until stable before having pt seen by [facility].   7. Refill Flexeril/Cyclobenzparine 10 mg 3x/day as needed #5 refills.

## 2022-08-02 NOTE — Progress Notes (Signed)
Subjective:    Patient ID: [name], male    DOB: 02/14/1961, 62 y.o.   MRN: [id number]  HPI  Pt is a 62 yr old male with hx of SVT, interstitial lung disease, sickle cell syndrome, CKD- last Cr 1.50, Afib; migraine; Cervical and lumbar radiculopathy and OA of knee-as well as fibromyalgia.  Also has depression and HTN; chronic fatigue syndrome - on Eliquis for pAFib , and gout. 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  Also needs L THR- leg will just give out.  Pt here for f/u on Cervical myelopathy.  Saw Dr Jordan Likes in March- asking for epidural steroid injection for low back- Neck was doing well.   Got epidural steroid injection that Dr Jordan Likes referred him for-  got ~ 06/20/22- saw him 06/12/22 initially.   Helped a little- took the edge off- but still dealing with pain down leg- was told, needs surgery for low back.   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  Usually takes every 2-3 days, takes Norco- and then takes 1-2 tabs.   Is a lot more comfortable since I last saw him- 11/02/21.  Was dealing with pain from anal fissures-  Steroids were helpful.  Last got Norco filled 05/26/22-  has a lot of pills left.   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   Still deals with pain in R shoulder-   Muscle relaxant help some- gets them through the night- takes at bedtime usually.   Still in low back more on L- when has a BM, pain shoots down leg down to the ankle- was going into toes, but now mid/lower calf when has BM now.    Wasn't taking Gabapentin lately- didn't feel was helpful- and so stopped and didn't notice a difference when stopped it.    Cousin - 82 years old- died of PE- had been traveling.    Social Hx:  Graveyard shift security  Pain Inventory Average Pain 8 Pain Right Now 4 My pain is  sharp, dull, and aching  In the last 24 hours, has pain interfered with the following? General activity 5 Relation with others 10 Enjoyment of life 10 What TIME of day is your pain at its worst? daytime Sleep (in general) Fair  Pain is worse with: walking, bending, inactivity, standing, and some activites Pain improves with: rest, heat/ice, medication, and TENS Relief from Meds: 8  Family History  Problem Relation Age of Onset   CAD Mother        died in her 64's - MI   Multiple myeloma Brother    Sickle cell anemia Father        died @ 24   Gastric cancer Neg Hx    Social History   Socioeconomic History   Marital status: Divorced    Spouse name: Not on file   Number of children: Not on file   Years of education: Not on file   Highest education level: Not on file  Occupational History   Not on file  Tobacco Use   Smoking status: Never   Smokeless tobacco: Never  Vaping Use   Vaping Use: Never used  Substance and Sexual Activity   Alcohol use: No   Drug use: Never   Sexual activity: Not on file  Other Topics Concern  Not on file  Social History Narrative   Lives in [place] with dtr.   Social Determinants of Health   Financial Resource Strain: Not on file  Food Insecurity: Not on file  Transportation Needs: Not on file  Physical Activity: Not on file  Stress: Not on file  Social Connections: Not on file   Past Surgical History:  Procedure Laterality Date   ANTERIOR CERVICAL DECOMP/DISCECTOMY FUSION N/A 08/10/2021   Procedure: CERVICAL THREE-FOUR, CERVICAL FOUR-FIVE, CERVICAL FIVE-SIX ANTERIOR CERVICAL DECOMPRESSION/DISCECTOMY FUSION;  Surgeon: Julio Sicks, MD;  Location: [facility] OR;  Service: Neurosurgery;  Laterality: N/A;  3C/RM 20   ATRIAL FIBRILLATION ABLATION     BIOPSY  08/27/2018   Procedure: BIOPSY;  Surgeon: Tressia Danas, MD;  Location: [facility] ENDOSCOPY;  Service: Gastroenterology;;   BIOPSY  08/28/2018   Procedure: BIOPSY;  Surgeon: Tressia Danas, MD;   Location: [facility] ENDOSCOPY;  Service: Gastroenterology;;   COLONOSCOPY WITH PROPOFOL N/A 08/28/2018   Procedure: COLONOSCOPY WITH PROPOFOL;  Surgeon: Tressia Danas, MD;  Location: [facility] ENDOSCOPY;  Service: Gastroenterology;  Laterality: N/A;   ENTEROSCOPY N/A 08/27/2018   Procedure: ENTEROSCOPY;  Surgeon: Tressia Danas, MD;  Location: [facility] ENDOSCOPY;  Service: Gastroenterology;  Laterality: N/A;   GIVENS CAPSULE STUDY N/A 08/28/2018   Procedure: GIVENS CAPSULE STUDY;  Surgeon: Tressia Danas, MD;  Location: [facility] ENDOSCOPY;  Service: Gastroenterology;  Laterality: N/A;   LEFT HEART CATHETERIZATION WITH CORONARY ANGIOGRAM N/A 03/11/2013   Procedure: LEFT HEART CATHETERIZATION WITH CORONARY ANGIOGRAM;  Surgeon: Dolores Patty, MD;  Location: [facility] CATH LAB;  Service: Cardiovascular;  Laterality: N/A;   POLYPECTOMY  08/28/2018   Procedure: POLYPECTOMY;  Surgeon: Tressia Danas, MD;  Location: [facility] ENDOSCOPY;  Service: Gastroenterology;;   TONSILLECTOMY     TRANSURETHRAL RESECTION OF PROSTATE     Past Surgical History:  Procedure Laterality Date   ANTERIOR CERVICAL DECOMP/DISCECTOMY FUSION N/A 08/10/2021   Procedure: CERVICAL THREE-FOUR, CERVICAL FOUR-FIVE, CERVICAL FIVE-SIX ANTERIOR CERVICAL DECOMPRESSION/DISCECTOMY FUSION;  Surgeon: Julio Sicks, MD;  Location: [facility] OR;  Service: Neurosurgery;  Laterality: N/A;  3C/RM 20   ATRIAL FIBRILLATION ABLATION     BIOPSY  08/27/2018   Procedure: BIOPSY;  Surgeon: Tressia Danas, MD;  Location: [facility] ENDOSCOPY;  Service: Gastroenterology;;   BIOPSY  08/28/2018   Procedure: BIOPSY;  Surgeon: Tressia Danas, MD;  Location: [facility] ENDOSCOPY;  Service: Gastroenterology;;   COLONOSCOPY WITH PROPOFOL N/A 08/28/2018   Procedure: COLONOSCOPY WITH PROPOFOL;  Surgeon: Tressia Danas, MD;  Location: [facility] ENDOSCOPY;  Service: Gastroenterology;  Laterality: N/A;   ENTEROSCOPY N/A 08/27/2018   Procedure: ENTEROSCOPY;  Surgeon: Tressia Danas, MD;  Location: [facility] ENDOSCOPY;   Service: Gastroenterology;  Laterality: N/A;   GIVENS CAPSULE STUDY N/A 08/28/2018   Procedure: GIVENS CAPSULE STUDY;  Surgeon: Tressia Danas, MD;  Location: [facility] ENDOSCOPY;  Service: Gastroenterology;  Laterality: N/A;   LEFT HEART CATHETERIZATION WITH CORONARY ANGIOGRAM N/A 03/11/2013   Procedure: LEFT HEART CATHETERIZATION WITH CORONARY ANGIOGRAM;  Surgeon: Dolores Patty, MD;  Location: [facility] CATH LAB;  Service: Cardiovascular;  Laterality: N/A;   POLYPECTOMY  08/28/2018   Procedure: POLYPECTOMY;  Surgeon: Tressia Danas, MD;  Location: [facility] ENDOSCOPY;  Service: Gastroenterology;;   TONSILLECTOMY     TRANSURETHRAL RESECTION OF PROSTATE     Past Medical History:  Diagnosis Date   A-fib (HCC)    Chest pain    a. 11/2002 Cath: EF 68%, LM nl, LAD small, nl, LCX nl, RCA tortuous/nl;  b. 08/2011 St Echo: EF 60%, normal contractility, no scar/ischemia.  COPD (chronic obstructive pulmonary disease) (HCC)    Dysrhythmia    Enlarged prostate    Gastrointestinal hemorrhage 08/25/2018   GERD (gastroesophageal reflux disease)    GI bleeding 08/2018   Hypertension    Sickle cell trait (HCC)    Sleep apnea    SVT (supraventricular tachycardia) (HCC)    BP (!) 144/92   Pulse 62   Ht 5\' 11"  (1.803 m)   Wt 193 lb (87.5 kg)   SpO2 98%   BMI 26.92 kg/m   Opioid Risk Score:   Fall Risk Score:  `1  Depression screen [facility] 2/9     08/02/2022    9:04 AM 11/02/2021    9:48 AM 09/07/2021    1:13 PM 05/28/2021   11:07 AM  Depression screen PHQ 2/9  Decreased Interest 0 1 0 0  Down, Depressed, Hopeless 0 1 0 0  PHQ - 2 Score 0 2 0 0  Altered sleeping    1  Tired, decreased energy    3  Change in appetite    0  Feeling bad or failure about yourself     0  Trouble concentrating    0  Moving slowly or fidgety/restless    0  Suicidal thoughts    0  PHQ-9 Score    4  Difficult doing work/chores    Not difficult at all      Review of Systems  Musculoskeletal:        B/L shoulders LT rib  pectoral area LT leg LT posterior hamstring pain   All other systems reviewed and are negative.     Objective:   Physical Exam Awake, alert, appropriate; NAD Has palpable- trigger point/muscle spasms in L lumbar spine-  Also TTP on palpation as well as in L buttock B/L shoulders- impingement syndrome B/L with empty can test TTP in RTC/posterior shoulder B/L  Also TTP over shoulder c/w bicipital tendinitis R>L       Assessment & Plan:   Pt is a 62 yr old male with hx of SVT, interstitial lung disease, sickle cell syndrome, CKD- last Cr 1.50, Afib; migraine; Cervical and lumbar radiculopathy and OA of knee-as well as fibromyalgia.  Also has depression and HTN; chronic fatigue syndrome - on Eliquis, and gout. 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  Also needs L THR- leg will just give out.  Pt here for f/u on Cervical myelopathy. 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   2. Doesn't need refill for Norco today, but will con't it.  Last refill 05/26/22.   3. Didn't respond to Gabapentin- stopped it.   4. Try Tennis ball-  4-5 minutes on low back against wall; 10 minutes on L buttock-  keep one in car, and one at home and one for work.   5. UDS  since has been 7 months-   6. F/U in 3 months- wait until stable before having pt seen by [facility].   7. Refill Flexeril/Cyclobenzparine 10 mg 3x/day as needed #5 refills.

## 2022-08-08 LAB — TOXASSURE SELECT,+ANTIDEPR,UR

## 2022-08-28 ENCOUNTER — Telehealth: Payer: Self-pay | Admitting: *Deleted

## 2022-08-28 NOTE — Telephone Encounter (Signed)
Urine drug screen for this encounter is consistent for having no prescribed or unprescribed controlled medication. 

## 2022-11-01 ENCOUNTER — Encounter
Payer: Medicare Other | Attending: Physical Medicine and Rehabilitation | Admitting: Physical Medicine and Rehabilitation

## 2022-11-01 ENCOUNTER — Encounter: Payer: Self-pay | Admitting: Physical Medicine and Rehabilitation

## 2022-11-01 VITALS — BP 139/89 | HR 60 | Ht 71.0 in | Wt 200.0 lb

## 2022-11-01 DIAGNOSIS — G959 Disease of spinal cord, unspecified: Secondary | ICD-10-CM | POA: Insufficient documentation

## 2022-11-01 DIAGNOSIS — M7918 Myalgia, other site: Secondary | ICD-10-CM

## 2022-11-01 DIAGNOSIS — M48062 Spinal stenosis, lumbar region with neurogenic claudication: Secondary | ICD-10-CM

## 2022-11-01 DIAGNOSIS — M7542 Impingement syndrome of left shoulder: Secondary | ICD-10-CM

## 2022-11-01 DIAGNOSIS — M7522 Bicipital tendinitis, left shoulder: Secondary | ICD-10-CM

## 2022-11-01 MED ORDER — HYDROCODONE-ACETAMINOPHEN 7.5-325 MG PO TABS: 1.0000 | ORAL_TABLET | Freq: Four times a day (QID) | ORAL | 0 refills | Status: DC | PRN

## 2022-11-01 NOTE — Patient Instructions (Addendum)
Pt is a 62 yr old male with hx of SVT, interstitial lung disease, sickle cell syndrome, CKD- last Cr 1.50, Afib; migraine; Cervical and lumbar radiculopathy and OA of knee-as well as fibromyalgia.  Also has depression and HTN; chronic fatigue syndrome - on Eliquis for pAFib , and gout. 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  Also needs L THR- leg will just give out.  Pt here for f/u on Cervical myelopathy.   Call Dr Lindalou Hose office to get Lumbar ESI- since it took pain away.   2. Will give Rx for Norco 7.5 mg/325 mg- as needed up to 4x/day-   3. Don't take Norco along with other pain medicine from surgery at the same time- take tis Norco UNTIL surgery- don't take on day of surgery-  and then can restart after finishes the pain meds surgeon gives him- this is OK.   4. Has stopped blood thinner for surgery.   5. Will get back AFTER surgery- after the next 2-3 weeks, and get you 2 injections in your L shoulder- not diabetic!   6. Needs injection from Dr Lindalou Hose office to be 2 weeks from my injections.  7.  Doesn't need UDS today-  - cannot do today because not taking pain meds.- was basically out of meds   8. F/U in 3 months- wait list after 2 weeks after surgery-  for L shoulder injection x2- not closer than 2 weeks form surgery or 2 weeks from Dr Lindalou Hose injection.

## 2022-11-01 NOTE — Progress Notes (Signed)
Subjective:    Patient ID: [name], male    DOB: 03-30-1961, 62 y.o.   MRN: [id number]  HPI  Pt is a 62 yr old male with hx of SVT, interstitial lung disease, sickle cell syndrome, CKD- last Cr 1.50, Afib; migraine; Cervical and lumbar radiculopathy and OA of knee-as well as fibromyalgia.  Also has depression and HTN; chronic fatigue syndrome - on Eliquis for pAFib , and gout. 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  Also needs L THR- leg will just give out.  Pt here for f/u on Cervical myelopathy.  Last ESI for low back eased it some/ a lot- .   Just started coming back 2-3 weeks ago. So lasted >3 months.    L shoulder aching quite a bit- started 4-5 weeks ago.  Had from cervical myelopathy-   Going in to get B/L inguinal hernias surgery and upper hernia above  umbilicus but just doing groin hernia surgery- this upcoming Monday- Dr Ardyth Harps- general surgeon.  Will be prescribing something for pain- likely stronger than Norco.   Needs another Rx for Norco-    Flexeril allows him to sleep- helps him go to sleep.    Pain Inventory Average Pain 7 Pain Right Now 7 My pain is constant, tingling, and aching  In the last 24 hours, has pain interfered with the following? General activity 6 Relation with others 2 Enjoyment of life 2 What TIME of day is your pain at its worst? daytime and evening Sleep (in general) Fair  Pain is worse with: unsure Pain improves with: medication Relief from Meds: 5  Family History  Problem Relation Age of Onset   CAD Mother        died in her 11's - MI   Multiple myeloma Brother    Sickle cell anemia Father        died @ 51   Gastric cancer Neg Hx    Social History   Socioeconomic History   Marital status: Divorced    Spouse name: Not on file   Number of children: Not on file   Years of education: Not on file   Highest education level: Not on file  Occupational History   Not on file  Tobacco  Use   Smoking status: Never   Smokeless tobacco: Never  Vaping Use   Vaping status: Never Used  Substance and Sexual Activity   Alcohol use: No   Drug use: Never   Sexual activity: Not on file  Other Topics Concern   Not on file  Social History Narrative   Lives in [place] with dtr.   Social Determinants of Health   Financial Resource Strain: Not on file  Food Insecurity: Not on file  Transportation Needs: Not on file  Physical Activity: Not on file  Stress: Not on file  Social Connections: Unknown (08/16/2021)   Received from [facility]   Social Network    Social Network: Not on file   Past Surgical History:  Procedure Laterality Date   ANTERIOR CERVICAL DECOMP/DISCECTOMY FUSION N/A 08/10/2021   Procedure: CERVICAL THREE-FOUR, CERVICAL FOUR-FIVE, CERVICAL FIVE-SIX ANTERIOR CERVICAL DECOMPRESSION/DISCECTOMY FUSION;  Surgeon: Julio Sicks, MD;  Location: [facility] OR;  Service: Neurosurgery;  Laterality: N/A;  3C/RM 20   ATRIAL FIBRILLATION ABLATION     BIOPSY  08/27/2018   Procedure: BIOPSY;  Surgeon: Tressia Danas, MD;  Location: [facility] ENDOSCOPY;  Service: Gastroenterology;;   BIOPSY  08/28/2018   Procedure: BIOPSY;  Surgeon: Tressia Danas, MD;  Location: [facility] ENDOSCOPY;  Service: Gastroenterology;;   COLONOSCOPY WITH PROPOFOL N/A 08/28/2018   Procedure: COLONOSCOPY WITH PROPOFOL;  Surgeon: Tressia Danas, MD;  Location: [facility] ENDOSCOPY;  Service: Gastroenterology;  Laterality: N/A;   ENTEROSCOPY N/A 08/27/2018   Procedure: ENTEROSCOPY;  Surgeon: Tressia Danas, MD;  Location: [facility] ENDOSCOPY;  Service: Gastroenterology;  Laterality: N/A;   GIVENS CAPSULE STUDY N/A 08/28/2018   Procedure: GIVENS CAPSULE STUDY;  Surgeon: Tressia Danas, MD;  Location: [facility] ENDOSCOPY;  Service: Gastroenterology;  Laterality: N/A;   LEFT HEART CATHETERIZATION WITH CORONARY ANGIOGRAM N/A 03/11/2013   Procedure: LEFT HEART CATHETERIZATION WITH CORONARY ANGIOGRAM;  Surgeon: Dolores Patty,  MD;  Location: [facility] CATH LAB;  Service: Cardiovascular;  Laterality: N/A;   POLYPECTOMY  08/28/2018   Procedure: POLYPECTOMY;  Surgeon: Tressia Danas, MD;  Location: [facility] ENDOSCOPY;  Service: Gastroenterology;;   TONSILLECTOMY     TRANSURETHRAL RESECTION OF PROSTATE     Past Surgical History:  Procedure Laterality Date   ANTERIOR CERVICAL DECOMP/DISCECTOMY FUSION N/A 08/10/2021   Procedure: CERVICAL THREE-FOUR, CERVICAL FOUR-FIVE, CERVICAL FIVE-SIX ANTERIOR CERVICAL DECOMPRESSION/DISCECTOMY FUSION;  Surgeon: Julio Sicks, MD;  Location: [facility] OR;  Service: Neurosurgery;  Laterality: N/A;  3C/RM 20   ATRIAL FIBRILLATION ABLATION     BIOPSY  08/27/2018   Procedure: BIOPSY;  Surgeon: Tressia Danas, MD;  Location: [facility] ENDOSCOPY;  Service: Gastroenterology;;   BIOPSY  08/28/2018   Procedure: BIOPSY;  Surgeon: Tressia Danas, MD;  Location: [facility] ENDOSCOPY;  Service: Gastroenterology;;   COLONOSCOPY WITH PROPOFOL N/A 08/28/2018   Procedure: COLONOSCOPY WITH PROPOFOL;  Surgeon: Tressia Danas, MD;  Location: [facility] ENDOSCOPY;  Service: Gastroenterology;  Laterality: N/A;   ENTEROSCOPY N/A 08/27/2018   Procedure: ENTEROSCOPY;  Surgeon: Tressia Danas, MD;  Location: [facility] ENDOSCOPY;  Service: Gastroenterology;  Laterality: N/A;   GIVENS CAPSULE STUDY N/A 08/28/2018   Procedure: GIVENS CAPSULE STUDY;  Surgeon: Tressia Danas, MD;  Location: [facility] ENDOSCOPY;  Service: Gastroenterology;  Laterality: N/A;   LEFT HEART CATHETERIZATION WITH CORONARY ANGIOGRAM N/A 03/11/2013   Procedure: LEFT HEART CATHETERIZATION WITH CORONARY ANGIOGRAM;  Surgeon: Dolores Patty, MD;  Location: [facility] CATH LAB;  Service: Cardiovascular;  Laterality: N/A;   POLYPECTOMY  08/28/2018   Procedure: POLYPECTOMY;  Surgeon: Tressia Danas, MD;  Location: [facility] ENDOSCOPY;  Service: Gastroenterology;;   TONSILLECTOMY     TRANSURETHRAL RESECTION OF PROSTATE     Past Medical History:  Diagnosis Date   A-fib (HCC)    Chest pain    a. 11/2002  Cath: EF 68%, LM nl, LAD small, nl, LCX nl, RCA tortuous/nl;  b. 08/2011 St Echo: EF 60%, normal contractility, no scar/ischemia.   COPD (chronic obstructive pulmonary disease) (HCC)    Dysrhythmia    Enlarged prostate    Gastrointestinal hemorrhage 08/25/2018   GERD (gastroesophageal reflux disease)    GI bleeding 08/2018   Hypertension    Sickle cell trait (HCC)    Sleep apnea    SVT (supraventricular tachycardia)    Ht 5\' 11"  (1.803 m)   BMI 26.92 kg/m   Opioid Risk Score:   Fall Risk Score:  `1  Depression screen [facility] 2/9     08/02/2022    9:04 AM 11/02/2021    9:48 AM 09/07/2021    1:13 PM 05/28/2021   11:07 AM  Depression screen PHQ 2/9  Decreased Interest 0 1 0 0  Down, Depressed, Hopeless 0 1 0 0  PHQ - 2 Score 0 2 0 0  Altered sleeping  1  Tired, decreased energy    3  Change in appetite    0  Feeling bad or failure about yourself     0  Trouble concentrating    0  Moving slowly or fidgety/restless    0  Suicidal thoughts    0  PHQ-9 Score    4  Difficult doing work/chores    Not difficult at all     Review of Systems  Musculoskeletal:  Positive for back pain.       LT shoulder pain  All other systems reviewed and are negative.      Objective:   Physical Exam Awake, alert, sad affect; NAD Walking without any assistive device L shoulder impingement syndrome- secondary to empty can tst (+)- causes a lot of pain Also has TTP with biceps tendinitis- and pain with flexion of biceps-       Assessment & Plan:   Pt is a 62 yr old male with hx of SVT, interstitial lung disease, sickle cell syndrome, CKD- last Cr 1.50, Afib; migraine; Cervical and lumbar radiculopathy and OA of knee-as well as fibromyalgia.  Also has depression and HTN; chronic fatigue syndrome - on Eliquis for pAFib , and gout. 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  Also needs L THR- leg will just give out.  Pt here for f/u on Cervical myelopathy.   Call Dr  Lindalou Hose office to get Lumbar ESI- since it took pain away.   2. Will give Rx for Norco 7.5 mg/325 mg- as needed up to 4x/day-   3. Don't take Norco along with other pain medicine from surgery at the same time- take tis Norco UNTIL surgery- don't take on day of surgery-  and then can restart after finishes the pain meds surgeon gives him- this is OK.   4. Has stopped blood thinner for surgery.   5. Will get back AFTER surgery- after the next 2-3 weeks, and get you 2 injections in your L shoulder- not diabetic!   6. Needs injection from Dr Lindalou Hose office to be 2 weeks from my injections.  7.  Doesn't need UDS today-  - cannot do today because not taking pain meds.- was basically out of meds   8. F/U in 3 months- wait list after 2 weeks after surgery- for L shoulder injection x2- not closer than 2 weeks form surgery or 2 weeks from Dr Lindalou Hose injection.    I spent a total of 25   minutes on total care today- >50% coordination of care- due to discussion about injections from me; pain meds and Dr Lindalou Hose office and spacing them out from each other and surgery- and d/w pt about UDS- will wait for next appt.

## 2022-11-12 ENCOUNTER — Other Ambulatory Visit: Payer: Self-pay | Admitting: Family Medicine

## 2022-11-12 DIAGNOSIS — Z8639 Personal history of other endocrine, nutritional and metabolic disease: Secondary | ICD-10-CM

## 2022-11-14 ENCOUNTER — Ambulatory Visit
Admission: RE | Admit: 2022-11-14 | Discharge: 2022-11-14 | Disposition: A | Discharge status: Home / Self Care | Payer: Medicare Other | Source: Ambulatory Visit | Attending: Family Medicine | Admitting: Family Medicine

## 2022-11-14 DIAGNOSIS — Z8639 Personal history of other endocrine, nutritional and metabolic disease: Secondary | ICD-10-CM

## 2022-12-10 ENCOUNTER — Other Ambulatory Visit: Payer: Self-pay | Admitting: Family Medicine

## 2022-12-10 DIAGNOSIS — E041 Nontoxic single thyroid nodule: Secondary | ICD-10-CM

## 2022-12-13 ENCOUNTER — Other Ambulatory Visit: Payer: Self-pay | Admitting: Family Medicine

## 2022-12-13 DIAGNOSIS — E041 Nontoxic single thyroid nodule: Secondary | ICD-10-CM

## 2022-12-21 ENCOUNTER — Other Ambulatory Visit: Payer: Self-pay | Admitting: Neurology

## 2022-12-23 NOTE — Telephone Encounter (Signed)
Requested Prescriptions   Pending Prescriptions Disp Refills   traMADol (ULTRAM) 50 MG tablet [Pharmacy Med Name: TRAMADOL 50MG  TABLETS] 120 tablet 0    Sig: Take 1 tablet (50 mg total) by mouth every 6 (six) hours as needed.   Last seen 10/10/21, next appt not scheduled. Denying refill

## 2023-01-27 ENCOUNTER — Ambulatory Visit
Admission: RE | Admit: 2023-01-27 | Discharge: 2023-01-27 | Disposition: A | Discharge status: Home / Self Care | Payer: Medicare Other | Source: Ambulatory Visit | Attending: Diagnostic Radiology | Admitting: Diagnostic Radiology

## 2023-01-27 ENCOUNTER — Other Ambulatory Visit: Payer: Self-pay | Admitting: Diagnostic Radiology

## 2023-01-27 ENCOUNTER — Other Ambulatory Visit (HOSPITAL_COMMUNITY)
Admission: RE | Admit: 2023-01-27 | Discharge: 2023-01-27 | Disposition: A | Discharge status: Home / Self Care | Payer: Medicare Other | Source: Ambulatory Visit | Attending: Diagnostic Radiology | Admitting: Diagnostic Radiology

## 2023-01-27 ENCOUNTER — Inpatient Hospital Stay
Admission: RE | Admit: 2023-01-27 | Discharge: 2023-01-27 | Disposition: A | Discharge status: Home / Self Care | Payer: Medicare Other | Source: Ambulatory Visit | Attending: Family Medicine | Admitting: Family Medicine

## 2023-01-27 DIAGNOSIS — E042 Nontoxic multinodular goiter: Secondary | ICD-10-CM | POA: Insufficient documentation

## 2023-01-27 DIAGNOSIS — E041 Nontoxic single thyroid nodule: Secondary | ICD-10-CM

## 2023-01-29 LAB — CYTOLOGY - NON PAP

## 2023-01-31 ENCOUNTER — Encounter
Payer: Medicare Other | Attending: Physical Medicine and Rehabilitation | Admitting: Physical Medicine and Rehabilitation

## 2023-01-31 ENCOUNTER — Encounter: Payer: Self-pay | Admitting: Physical Medicine and Rehabilitation

## 2023-01-31 VITALS — BP 131/92 | HR 62 | Ht 71.0 in | Wt 198.8 lb

## 2023-01-31 DIAGNOSIS — M7522 Bicipital tendinitis, left shoulder: Secondary | ICD-10-CM | POA: Diagnosis not present

## 2023-01-31 DIAGNOSIS — M7542 Impingement syndrome of left shoulder: Secondary | ICD-10-CM | POA: Insufficient documentation

## 2023-01-31 DIAGNOSIS — M7541 Impingement syndrome of right shoulder: Secondary | ICD-10-CM | POA: Diagnosis present

## 2023-01-31 DIAGNOSIS — M7918 Myalgia, other site: Secondary | ICD-10-CM | POA: Diagnosis not present

## 2023-01-31 DIAGNOSIS — G959 Disease of spinal cord, unspecified: Secondary | ICD-10-CM | POA: Diagnosis present

## 2023-01-31 DIAGNOSIS — M7521 Bicipital tendinitis, right shoulder: Secondary | ICD-10-CM | POA: Diagnosis not present

## 2023-01-31 MED ORDER — TRIAMCINOLONE ACETONIDE 40 MG/ML IJ SUSP
80.0000 mg | Freq: Once | INTRAMUSCULAR | Status: AC
  Administered 2023-01-31: 80 mg via INTRA_ARTICULAR

## 2023-01-31 MED ORDER — TRAMADOL HCL 50 MG PO TABS: 50.0000 mg | ORAL_TABLET | Freq: Two times a day (BID) | ORAL | 3 refills | Status: DC | PRN

## 2023-01-31 MED ORDER — LIDOCAINE HCL 1 % IJ SOLN
2.0000 mL | Freq: Once | INTRAMUSCULAR | Status: AC
  Administered 2023-01-31: 2 mL

## 2023-01-31 MED ORDER — BETAMETHASONE SOD PHOS & ACET 6 (3-3) MG/ML IJ SUSP: 12.0000 mg | Freq: Once | INTRAMUSCULAR | Status: DC

## 2023-01-31 NOTE — Addendum Note (Signed)
Addended by: Doreene Eland on: 01/31/2023 03:57 PM   Modules accepted: Orders

## 2023-01-31 NOTE — Progress Notes (Signed)
Pt is a 62 yr old male with hx of SVT, interstitial lung disease, sickle cell syndrome, CKD- last Cr 1.50, Afib; migraine; Cervical and lumbar radiculopathy and OA of knee-as well as fibromyalgia.  Also has depression and HTN; chronic fatigue syndrome - on Eliquis for pAFib , and gout. 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  Also needs L THR- leg will just give out.  Pt here for f/u on Cervical myelopathy\  Had repair of hernia- going well- surgery in July 2023.   Has multinodular goiter so had FNA of thyroid- to be watche,d but is benign.    Was supposed to get ESI by Dr Jordan Likes so didn't get to see him.   LLE so much pain lately.  Takes Norco or Tramadol It usually helps, so didn't worry about it.   Taking Norco mainly since out of Tramadol for 5 days.   Take Norco ~ 2-3x/week- last Rx 11/01/22-  Last got refill of Norco from hernia surgery- last filled Norco from general surgery-  around 10/14 was last filled.  Thinks was 90 pills- but not positive.  Was a [facility] doctor-   But is out of tramadol-  Last tramadol refill last year.     Plan: Will refill tramadol  50 mg up to 2x/day as needed # 60- with 3 refills.   2. Has Rx from General surgeon - last filled ~ 01/20/23- ~ 90 pills- for severe pain- will take back over doing pain meds after this current rx.    3. steroid injection was performed at  bicipital tendons  on L and RTC  using 1% plain Lidocaine and 40mg  /1cc of Kenalog. This was well tolerated.  Cleaned with betadine x3 and allowed to dry- then alcohol then injected using 27 gauge 1.5 inch needle- no bleeding or complications.    F/U in 3 months for steroid injections of L shoulders/biceps tendons.  Lidocaine will kick in 15 minutes- and wear off tonight- the steroid will kick in tomorrow within 24 hours and take up to 72 hours to fully kick in.  4. F/U in 2 months with Riley Lam- and 3 months with me- for L shoulder injectiosn x2 and  f/u on pain.

## 2023-01-31 NOTE — Patient Instructions (Signed)
Plan: Will refill tramadol  50 mg up to 2x/day as needed # 60- with 3 refills.   2. Has Rx from General surgeon - last filled ~ 01/20/23- ~ 90 pills- for severe pain- will take back over doing pain meds after this current rx.    3. steroid injection was performed at  bicipital tendons  on L and RTC  using 1% plain Lidocaine and 40mg  /1cc of Kenalog. This was well tolerated.  Cleaned with betadine x3 and allowed to dry- then alcohol then injected using 27 gauge 1.5 inch needle- no bleeding or complications.    F/U in 3 months for steroid injections of L shoulders/biceps tendons.  Lidocaine will kick in 15 minutes- and wear off tonight- the steroid will kick in tomorrow within 24 hours and take up to 72 hours to fully kick in.  4. F/U in 2 months with Riley Lam- and 3 months with me- for L shoulder injectiosn x2 and f/u on pain.

## 2023-03-31 ENCOUNTER — Encounter: Payer: Medicare Other | Attending: Physical Medicine and Rehabilitation | Admitting: Registered Nurse

## 2023-03-31 DIAGNOSIS — M7918 Myalgia, other site: Secondary | ICD-10-CM | POA: Insufficient documentation

## 2023-03-31 DIAGNOSIS — M7542 Impingement syndrome of left shoulder: Secondary | ICD-10-CM | POA: Insufficient documentation

## 2023-03-31 DIAGNOSIS — G959 Disease of spinal cord, unspecified: Secondary | ICD-10-CM | POA: Insufficient documentation

## 2023-03-31 DIAGNOSIS — M7541 Impingement syndrome of right shoulder: Secondary | ICD-10-CM | POA: Insufficient documentation

## 2023-03-31 DIAGNOSIS — M7522 Bicipital tendinitis, left shoulder: Secondary | ICD-10-CM | POA: Insufficient documentation

## 2023-03-31 NOTE — Progress Notes (Deleted)
Subjective:    Patient ID: [name], male    DOB: [birth year], 62 y.o.   MRN: [id number]  HPI Pain Inventory Average Pain {NUMBERS; 0-10:5044} Pain Right Now {NUMBERS; 0-10:5044} My pain is {PAIN DESCRIPTION:21022940}  In the last 24 hours, has pain interfered with the following? General activity {NUMBERS; 0-10:5044} Relation with others {NUMBERS; 0-10:5044} Enjoyment of life {NUMBERS; 0-10:5044} What TIME of day is your pain at its worst? {time of day:24191} Sleep (in general) {BHH GOOD/FAIR/POOR:22877}  Pain is worse with: {ACTIVITIES:21022942} Pain improves with: {PAIN IMPROVES HQIO:96295284} Relief from Meds: {NUMBERS; 0-10:5044}  Family History  Problem Relation Age of Onset   CAD Mother        died in her 32's - MI   Multiple myeloma Brother    Sickle cell anemia Father        died @ 13   Gastric cancer Neg Hx    Social History   Socioeconomic History   Marital status: Divorced    Spouse name: Not on file   Number of children: Not on file   Years of education: Not on file   Highest education level: Not on file  Occupational History   Not on file  Tobacco Use   Smoking status: Never   Smokeless tobacco: Never  Vaping Use   Vaping status: Never Used  Substance and Sexual Activity   Alcohol use: No   Drug use: Never   Sexual activity: Not on file  Other Topics Concern   Not on file  Social History Narrative   Lives in [place] with dtr.   Social Drivers of Corporate investment banker Strain: Not on file  Food Insecurity: Low Risk  (11/04/2022)   Received from [facility]   Hunger Vital Sign    Worried About Running Out of Food in the Last Year: Never true    Ran Out of Food in the Last Year: Never true  Transportation Needs: Not on file (11/04/2022)  Physical Activity: Not on file  Stress: Not on file  Social Connections: Unknown (08/16/2021)   [facility], [facility]   Social Network    Social Network: Not on file   Past  Surgical History:  Procedure Laterality Date   ANTERIOR CERVICAL DECOMP/DISCECTOMY FUSION N/A 08/10/2021   Procedure: CERVICAL THREE-FOUR, CERVICAL FOUR-FIVE, CERVICAL FIVE-SIX ANTERIOR CERVICAL DECOMPRESSION/DISCECTOMY FUSION;  Surgeon: Julio Sicks, MD;  Location: [facility] OR;  Service: Neurosurgery;  Laterality: N/A;  3C/RM 20   ATRIAL FIBRILLATION ABLATION     BIOPSY  08/27/2018   Procedure: BIOPSY;  Surgeon: Tressia Danas, MD;  Location: [facility] ENDOSCOPY;  Service: Gastroenterology;;   BIOPSY  08/28/2018   Procedure: BIOPSY;  Surgeon: Tressia Danas, MD;  Location: [facility] ENDOSCOPY;  Service: Gastroenterology;;   COLONOSCOPY WITH PROPOFOL N/A 08/28/2018   Procedure: COLONOSCOPY WITH PROPOFOL;  Surgeon: Tressia Danas, MD;  Location: [facility] ENDOSCOPY;  Service: Gastroenterology;  Laterality: N/A;   ENTEROSCOPY N/A 08/27/2018   Procedure: ENTEROSCOPY;  Surgeon: Tressia Danas, MD;  Location: [facility] ENDOSCOPY;  Service: Gastroenterology;  Laterality: N/A;   GIVENS CAPSULE STUDY N/A 08/28/2018   Procedure: GIVENS CAPSULE STUDY;  Surgeon: Tressia Danas, MD;  Location: [facility] ENDOSCOPY;  Service: Gastroenterology;  Laterality: N/A;   LEFT HEART CATHETERIZATION WITH CORONARY ANGIOGRAM N/A 03/11/2013   Procedure: LEFT HEART CATHETERIZATION WITH CORONARY ANGIOGRAM;  Surgeon: Dolores Patty, MD;  Location: [facility] CATH LAB;  Service: Cardiovascular;  Laterality: N/A;   POLYPECTOMY  08/28/2018   Procedure: POLYPECTOMY;  Surgeon: Tressia Danas, MD;  Location: [facility] ENDOSCOPY;  Service: Gastroenterology;;   TONSILLECTOMY     TRANSURETHRAL RESECTION OF PROSTATE     Past Surgical History:  Procedure Laterality Date   ANTERIOR CERVICAL DECOMP/DISCECTOMY FUSION N/A 08/10/2021   Procedure: CERVICAL THREE-FOUR, CERVICAL FOUR-FIVE, CERVICAL FIVE-SIX ANTERIOR CERVICAL DECOMPRESSION/DISCECTOMY FUSION;  Surgeon: Julio Sicks, MD;  Location: [facility] OR;  Service: Neurosurgery;  Laterality: N/A;  3C/RM 20   ATRIAL FIBRILLATION ABLATION      BIOPSY  08/27/2018   Procedure: BIOPSY;  Surgeon: Tressia Danas, MD;  Location: [facility] ENDOSCOPY;  Service: Gastroenterology;;   BIOPSY  08/28/2018   Procedure: BIOPSY;  Surgeon: Tressia Danas, MD;  Location: [facility] ENDOSCOPY;  Service: Gastroenterology;;   COLONOSCOPY WITH PROPOFOL N/A 08/28/2018   Procedure: COLONOSCOPY WITH PROPOFOL;  Surgeon: Tressia Danas, MD;  Location: [facility] ENDOSCOPY;  Service: Gastroenterology;  Laterality: N/A;   ENTEROSCOPY N/A 08/27/2018   Procedure: ENTEROSCOPY;  Surgeon: Tressia Danas, MD;  Location: [facility] ENDOSCOPY;  Service: Gastroenterology;  Laterality: N/A;   GIVENS CAPSULE STUDY N/A 08/28/2018   Procedure: GIVENS CAPSULE STUDY;  Surgeon: Tressia Danas, MD;  Location: [facility] ENDOSCOPY;  Service: Gastroenterology;  Laterality: N/A;   LEFT HEART CATHETERIZATION WITH CORONARY ANGIOGRAM N/A 03/11/2013   Procedure: LEFT HEART CATHETERIZATION WITH CORONARY ANGIOGRAM;  Surgeon: Dolores Patty, MD;  Location: [facility] CATH LAB;  Service: Cardiovascular;  Laterality: N/A;   POLYPECTOMY  08/28/2018   Procedure: POLYPECTOMY;  Surgeon: Tressia Danas, MD;  Location: [facility] ENDOSCOPY;  Service: Gastroenterology;;   TONSILLECTOMY     TRANSURETHRAL RESECTION OF PROSTATE     Past Medical History:  Diagnosis Date   A-fib (HCC)    Chest pain    a. 11/2002 Cath: EF 68%, LM nl, LAD small, nl, LCX nl, RCA tortuous/nl;  b. 08/2011 St Echo: EF 60%, normal contractility, no scar/ischemia.   COPD (chronic obstructive pulmonary disease) (HCC)    Dysrhythmia    Enlarged prostate    Gastrointestinal hemorrhage 08/25/2018   GERD (gastroesophageal reflux disease)    GI bleeding 08/2018   Hypertension    Sickle cell trait (HCC)    Sleep apnea    SVT (supraventricular tachycardia) (HCC)    There were no vitals taken for this visit.  Opioid Risk Score:   Fall Risk Score:  `1  Depression screen [facility] 2/9     01/31/2023   11:21 AM 11/01/2022   10:22 AM 08/02/2022    9:04 AM  11/02/2021    9:48 AM 09/07/2021    1:13 PM 05/28/2021   11:07 AM  Depression screen PHQ 2/9  Decreased Interest 0 0 0 1 0 0  Down, Depressed, Hopeless 0 0 0 1 0 0  PHQ - 2 Score 0 0 0 2 0 0  Altered sleeping      1  Tired, decreased energy      3  Change in appetite      0  Feeling bad or failure about yourself       0  Trouble concentrating      0  Moving slowly or fidgety/restless      0  Suicidal thoughts      0  PHQ-9 Score      4  Difficult doing work/chores      Not difficult at all      Review of Systems     Objective:   Physical Exam        Assessment & Plan:

## 2023-05-02 ENCOUNTER — Encounter: Payer: Self-pay | Admitting: Physical Medicine and Rehabilitation

## 2023-05-02 ENCOUNTER — Encounter
Payer: Medicare Other | Attending: Physical Medicine and Rehabilitation | Admitting: Physical Medicine and Rehabilitation

## 2023-05-02 VITALS — BP 143/92 | HR 56 | Ht 71.0 in | Wt 200.0 lb

## 2023-05-02 DIAGNOSIS — M7541 Impingement syndrome of right shoulder: Secondary | ICD-10-CM

## 2023-05-02 DIAGNOSIS — G894 Chronic pain syndrome: Secondary | ICD-10-CM | POA: Diagnosis present

## 2023-05-02 DIAGNOSIS — Z79891 Long term (current) use of opiate analgesic: Secondary | ICD-10-CM | POA: Diagnosis present

## 2023-05-02 DIAGNOSIS — M7522 Bicipital tendinitis, left shoulder: Secondary | ICD-10-CM | POA: Diagnosis present

## 2023-05-02 DIAGNOSIS — M7918 Myalgia, other site: Secondary | ICD-10-CM

## 2023-05-02 DIAGNOSIS — M7542 Impingement syndrome of left shoulder: Secondary | ICD-10-CM | POA: Insufficient documentation

## 2023-05-02 DIAGNOSIS — Z5181 Encounter for therapeutic drug level monitoring: Secondary | ICD-10-CM

## 2023-05-02 DIAGNOSIS — G959 Disease of spinal cord, unspecified: Secondary | ICD-10-CM | POA: Diagnosis present

## 2023-05-02 MED ORDER — TRIAMCINOLONE ACETONIDE 40 MG/ML IJ SUSP: 80.0000 mg | Freq: Once | INTRAMUSCULAR | Status: DC

## 2023-05-02 MED ORDER — LIDOCAINE HCL 1 % IJ SOLN: 2.0000 mL | Freq: Once | INTRAMUSCULAR | Status: DC

## 2023-05-02 MED ORDER — LIDOCAINE HCL 1 % IJ SOLN: 6.0000 mL | Freq: Once | INTRAMUSCULAR | Status: AC

## 2023-05-02 NOTE — Progress Notes (Signed)
Pt is a 63 yr old male with hx of SVT, interstitial lung disease, sickle cell syndrome, CKD- last Cr 1.50, Afib; migraine; Cervical and lumbar radiculopathy and OA of knee-as well as fibromyalgia.  Also has depression and HTN; chronic fatigue syndrome - on Eliquis for pAFib , and gout. 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  Also needs L THR- leg will just give out.  Pt here for f/u on Cervical myelopathy\   Doing PT on R side of neck- had dry needling a few times.  First time helped some- 2nd time not at all.  And ended up with noses bleed a few days later.    Pain just below occiput on back of R skull- radiates down to R side of front of head/face.  Across R eye.   Taking Tramadol rarely, but usually taking tylenol.  Had been avoiding tramadol because concern of kidneys.   Taking Norco- just took some the other day- 2-3x/week. I last refilled 10/30/22- but also had one from outside doctor in October from [facility]- 90 pills- he still has some.    Both sets of labs in 9/24 and 1/25- showed Cr 1.12 to 1.2- and normal up to 1.30   L shoulder not bothering him anymore. Since last injections.   Didn't follow up with Riley Lam- something occurred- couldn't make it.     Plan: Con't Norco as needed when tramadol doesn't work- however has enough pills left right now so doesn' tneed refill today.   2. Con't Tramadol- hasn't been taking, but assured him kidney function is such that can take Tramadol up to 3x/day- if you need it. Pain of R neck as well as shoulder and other pains are reason to take tramadol.    3. Patient here for trigger point injections for R occipital and upper trap pain  Consent done and on chart.  Cleaned areas with alcohol and injected using a 27 gauge 1.5 inch needle  Injected 3cc- none wasted Using 1% Lidocaine with no EPI  Upper traps B/L and 3x on R Levators Posterior scalenes Middle scalenes Splenius Capitus- R only-   Pectoralis Major Rhomboids Infraspinatus Teres Major/minor Thoracic paraspinals Lumbar paraspinals Other injections-     There was no bleeding or complications.  Patient was advised to drink a lot of water on day after injections to flush system Will have increased soreness for 12-48 hours after injections.  Can use Lidocaine patches the day AFTER injections Can use theracane on day of injections in places didn't inject Can use heating pad 4-6 hours AFTER injections  4. Urine Drug screen due today- per clinic policy.    5. F/U in 3 months- trigger point injections and possible L shoulder injection x2   I spent a total of 26   minutes on total care today- >50% coordination of care- due to  4 minutes on injections- rest discussing options for pain control and how to take meds- also UDS and helping pt deal with grief.

## 2023-05-02 NOTE — Patient Instructions (Signed)
Plan: Con't Norco as needed when tramadol doesn't work- however has enough pills left right now so doesn' tneed refill today.   2. Con't Tramadol- hasn't been taking, but assured him kidney function is such that can take Tramadol up to 3x/day- if you need it. Pain of R neck as well as shoulder and other pains are reason to take tramadol.    3. Patient here for trigger point injections for R occipital and upper trap pain  Consent done and on chart.  Cleaned areas with alcohol and injected using a 27 gauge 1.5 inch needle  Injected 3cc- none wasted Using 1% Lidocaine with no EPI  Upper traps B/L and 3x on R Levators Posterior scalenes Middle scalenes Splenius Capitus- R only-  Pectoralis Major Rhomboids Infraspinatus Teres Major/minor Thoracic paraspinals Lumbar paraspinals Other injections-     There was no bleeding or complications.  Patient was advised to drink a lot of water on day after injections to flush system Will have increased soreness for 12-48 hours after injections.  Can use Lidocaine patches the day AFTER injections Can use theracane on day of injections in places didn't inject Can use heating pad 4-6 hours AFTER injections  4. Urine Drug screen due today- per clinic policy.    5. F/U in 3 months- trigger point injections and possible L shoulder injection x2

## 2023-05-06 LAB — TOXASSURE SELECT,+ANTIDEPR,UR

## 2023-08-01 ENCOUNTER — Encounter
Payer: Medicare Other | Attending: Physical Medicine and Rehabilitation | Admitting: Physical Medicine and Rehabilitation

## 2023-08-01 DIAGNOSIS — Z79891 Long term (current) use of opiate analgesic: Secondary | ICD-10-CM | POA: Insufficient documentation

## 2023-08-01 DIAGNOSIS — G894 Chronic pain syndrome: Secondary | ICD-10-CM | POA: Insufficient documentation

## 2023-08-01 DIAGNOSIS — G959 Disease of spinal cord, unspecified: Secondary | ICD-10-CM | POA: Insufficient documentation

## 2023-08-01 DIAGNOSIS — M7542 Impingement syndrome of left shoulder: Secondary | ICD-10-CM | POA: Insufficient documentation

## 2023-08-01 DIAGNOSIS — M7918 Myalgia, other site: Secondary | ICD-10-CM | POA: Insufficient documentation

## 2023-08-01 DIAGNOSIS — M7522 Bicipital tendinitis, left shoulder: Secondary | ICD-10-CM | POA: Insufficient documentation

## 2023-08-01 DIAGNOSIS — M7541 Impingement syndrome of right shoulder: Secondary | ICD-10-CM | POA: Insufficient documentation

## 2023-08-01 DIAGNOSIS — Z5181 Encounter for therapeutic drug level monitoring: Secondary | ICD-10-CM | POA: Insufficient documentation

## 2023-10-01 ENCOUNTER — Emergency Department (HOSPITAL_BASED_OUTPATIENT_CLINIC_OR_DEPARTMENT_OTHER)

## 2023-10-01 ENCOUNTER — Encounter (HOSPITAL_BASED_OUTPATIENT_CLINIC_OR_DEPARTMENT_OTHER): Payer: Self-pay

## 2023-10-01 ENCOUNTER — Emergency Department (HOSPITAL_BASED_OUTPATIENT_CLINIC_OR_DEPARTMENT_OTHER)
Admission: EM | Admit: 2023-10-01 | Discharge: 2023-10-01 | Disposition: A | Discharge status: Home / Self Care | Attending: Emergency Medicine | Admitting: Emergency Medicine

## 2023-10-01 ENCOUNTER — Other Ambulatory Visit: Payer: Self-pay

## 2023-10-01 DIAGNOSIS — Z7901 Long term (current) use of anticoagulants: Secondary | ICD-10-CM | POA: Insufficient documentation

## 2023-10-01 DIAGNOSIS — Z7951 Long term (current) use of inhaled steroids: Secondary | ICD-10-CM | POA: Insufficient documentation

## 2023-10-01 DIAGNOSIS — Z79899 Other long term (current) drug therapy: Secondary | ICD-10-CM | POA: Insufficient documentation

## 2023-10-01 DIAGNOSIS — I10 Essential (primary) hypertension: Secondary | ICD-10-CM | POA: Insufficient documentation

## 2023-10-01 DIAGNOSIS — J449 Chronic obstructive pulmonary disease, unspecified: Secondary | ICD-10-CM | POA: Diagnosis not present

## 2023-10-01 DIAGNOSIS — M5416 Radiculopathy, lumbar region: Secondary | ICD-10-CM | POA: Insufficient documentation

## 2023-10-01 DIAGNOSIS — I480 Paroxysmal atrial fibrillation: Secondary | ICD-10-CM | POA: Insufficient documentation

## 2023-10-01 DIAGNOSIS — M545 Low back pain, unspecified: Secondary | ICD-10-CM | POA: Diagnosis present

## 2023-10-01 LAB — CBC WITH DIFFERENTIAL/PLATELET
Abs Immature Granulocytes: 0.03 10*3/uL (ref 0.00–0.07)
Basophils Absolute: 0.1 10*3/uL (ref 0.0–0.1)
Basophils Relative: 1 %
Eosinophils Absolute: 0.3 10*3/uL (ref 0.0–0.5)
Eosinophils Relative: 6 %
HCT: 43.4 % (ref 39.0–52.0)
Hemoglobin: 14.7 g/dL (ref 13.0–17.0)
Immature Granulocytes: 1 %
Lymphocytes Relative: 31 %
Lymphs Abs: 1.8 10*3/uL (ref 0.7–4.0)
MCH: 28.7 pg (ref 26.0–34.0)
MCHC: 33.9 g/dL (ref 30.0–36.0)
MCV: 84.6 fL (ref 80.0–100.0)
Monocytes Absolute: 0.6 10*3/uL (ref 0.1–1.0)
Monocytes Relative: 11 %
Neutro Abs: 3.1 10*3/uL (ref 1.7–7.7)
Neutrophils Relative %: 50 %
Platelets: 240 10*3/uL (ref 150–400)
RBC: 5.13 MIL/uL (ref 4.22–5.81)
RDW: 12.9 % (ref 11.5–15.5)
WBC: 5.9 10*3/uL (ref 4.0–10.5)
nRBC: 0 % (ref 0.0–0.2)

## 2023-10-01 LAB — URINALYSIS, ROUTINE W REFLEX MICROSCOPIC
Bilirubin Urine: NEGATIVE
Glucose, UA: NEGATIVE mg/dL
Hgb urine dipstick: NEGATIVE
Ketones, ur: NEGATIVE mg/dL
Leukocytes,Ua: NEGATIVE
Nitrite: NEGATIVE
Protein, ur: NEGATIVE mg/dL
Specific Gravity, Urine: 1.015 (ref 1.005–1.030)
pH: 6.5 (ref 5.0–8.0)

## 2023-10-01 LAB — COMPREHENSIVE METABOLIC PANEL WITH GFR
ALT: 24 U/L (ref 0–44)
AST: 34 U/L (ref 15–41)
Albumin: 4.5 g/dL (ref 3.5–5.0)
Alkaline Phosphatase: 94 U/L (ref 38–126)
Anion gap: 10 (ref 5–15)
BUN: 13 mg/dL (ref 8–23)
CO2: 26 mmol/L (ref 22–32)
Calcium: 9.7 mg/dL (ref 8.9–10.3)
Chloride: 106 mmol/L (ref 98–111)
Creatinine, Ser: 1.24 mg/dL (ref 0.61–1.24)
GFR, Estimated: >60 mL/min (ref >60)
Glucose, Bld: 91 mg/dL (ref 70–99)
Potassium: 3.9 mmol/L (ref 3.5–5.1)
Sodium: 142 mmol/L (ref 135–145)
Total Bilirubin: 0.8 mg/dL (ref 0.0–1.2)
Total Protein: 8.1 g/dL (ref 6.5–8.1)

## 2023-10-01 LAB — TROPONIN T, HIGH SENSITIVITY: Troponin T High Sensitivity: <15 ng/L (ref <19)

## 2023-10-01 MED ORDER — METHOCARBAMOL 500 MG PO TABS: 500.0000 mg | ORAL_TABLET | Freq: Two times a day (BID) | ORAL | 0 refills | Status: DC

## 2023-10-01 MED ORDER — METHYLPREDNISOLONE 4 MG PO TBPK: ORAL_TABLET | ORAL | 0 refills | Status: AC

## 2023-10-01 MED ORDER — FENTANYL CITRATE PF 50 MCG/ML IJ SOSY
50.0000 ug | PREFILLED_SYRINGE | Freq: Once | INTRAMUSCULAR | Status: AC
  Administered 2023-10-01: 50 ug via INTRAVENOUS
  Filled 2023-10-01: qty 1

## 2023-10-01 NOTE — ED Provider Notes (Signed)
 [place] EMERGENCY DEPARTMENT AT [facility] Provider Note   CSN: [id number] Arrival date & time: 10/01/23  9049     Patient presents with: Back Pain and Palpitations   [name] is a 63 y.o. male.  Patient with past history significant for paroxysmal atrial fibrillation, hypertension, COPD, prior cervical spinal fusion, and cervical myelopathy presents to the emergency department today with concerns of back pain and palpitations.  He reports that over the last week or so, has had developing worsening back pain from the mid lumbar spine down.  Endorses a burning type feeling in this area.  Denies any feelings of tingling or numbness on one side of his reported saddle paresthesia or loss of bowel or bladder control.  Has been trying to take hydrocodone  and Flexeril  for his symptoms prescribed by his PCP without improvement.   Back Pain Palpitations Associated symptoms: back pain        Prior to Admission medications   Medication Sig Start Date End Date Taking? Authorizing Provider  methocarbamol  (ROBAXIN ) 500 MG tablet Take 1 tablet (500 mg total) by mouth 2 (two) times daily. 10/01/23  Yes Shanikka Wonders A, PA-C  methylPREDNISolone  (MEDROL  DOSEPAK) 4 MG TBPK tablet Take 6 tablets (24 mg total) by mouth daily for 1 day, THEN 5 tablets (20 mg total) daily for 1 day, THEN 4 tablets (16 mg total) daily for 1 day, THEN 3 tablets (12 mg total) daily for 1 day, THEN 2 tablets (8 mg total) daily for 1 day, THEN 1 tablet (4 mg total) daily for 1 day. 10/01/23 10/07/23 Yes Arshi Duarte A, PA-C  acetaminophen  (TYLENOL ) 325 MG tablet Take 650 mg by mouth every 6 (six) hours as needed for moderate pain.    [provider]  amLODipine  (NORVASC ) 5 MG tablet Take 1 tablet (5 mg total) by mouth daily. 04/30/19 11/01/22  Shona Terry SAILOR, DO  apixaban  (ELIQUIS ) 5 MG TABS tablet Take 1 tablet (5 mg total) by mouth 2 (two) times daily. Take Eliquis  5 mg twice a day one week after GI  bleed.  Do not restart Xarelto. Patient taking differently: Take 5 mg by mouth 2 (two) times daily. 05/06/19   Shona Terry SAILOR, DO  atorvastatin  (LIPITOR) 10 MG tablet Take 10 mg by mouth daily. 08/18/21   [provider]  cetirizine (ZYRTEC) 10 MG tablet Take 10 mg by mouth daily as needed for allergies.     [provider]  Colchicine 0.6 MG CAPS Take 0.6 mg by mouth 2 (two) times daily as needed (gout flare). 01/17/20   [provider]  cyclobenzaprine  (FLEXERIL ) 10 MG tablet Take 1 tablet (10 mg total) by mouth 3 (three) times daily as needed for muscle spasms. 08/02/22   Lovorn, Megan, MD  diclofenac  (VOLTAREN ) 75 MG EC tablet TAKE 1 TABLET(75 MG) BY MOUTH TWICE DAILY 11/20/21   [provider]  famotidine  (PEPCID ) 20 MG tablet Take 40 mg by mouth daily as needed for heartburn or indigestion.    [provider]  ferrous sulfate  325 (65 FE) MG tablet Take 1 tablet (325 mg total) by mouth daily with breakfast. 04/30/19 11/01/22  Shona Terry SAILOR, DO  fluticasone  (FLONASE ) 50 MCG/ACT nasal spray Place 1 spray into both nostrils daily as needed for allergies or rhinitis.    [provider]  Fluticasone -Salmeterol (ADVAIR) 250-50 MCG/DOSE AEPB Inhale 1 puff into the lungs 2 (two) times daily as needed (wheezing).    [provider]  guaiFENesin  (MUCINEX ) 600 MG 12 hr tablet Take 600 mg by mouth daily as needed for to loosen phlegm. 05/18/21   [provider]  HYDROcodone -acetaminophen  (NORCO) 7.5-325 MG tablet Take 1 tablet by mouth every 6 (six) hours as needed for moderate pain (for pain from shoulder, back and hernia surgery). 11/01/22   Lovorn, Megan, MD  hydrocortisone  (ANUSOL -HC) 25 MG suppository Place 1 suppository (25 mg total) rectally 2 (two) times daily. For 7 days 09/15/21   Nettie, April, MD  isosorbide  mononitrate (IMDUR ) 30 MG 24 hr tablet Take 30 mg by mouth daily. 02/16/21   [provider]  lidocaine  (XYLOCAINE ) 2  % jelly Apply small amounts to anus as needed for pain up to tid for 2 weeks. 12/03/21   [provider]  linaclotide  (LINZESS ) 145 MCG CAPS capsule Take 145 mcg by mouth daily as needed (Constipation). 10/07/19   [provider]  nitroGLYCERIN  (NITROSTAT ) 0.4 MG SL tablet Place 1 tablet (0.4 mg total) under the tongue every 5 (five) minutes as needed for chest pain. 03/12/13   Singh, Prashant K, MD  NON FORMULARY CPAP at bedtime    [provider]  ondansetron  (ZOFRAN ) 8 MG tablet Take 8 mg by mouth every 8 (eight) hours as needed for nausea or vomiting.  10/20/18   [provider]  Oxcarbazepine  (TRILEPTAL ) 300 MG tablet Take 1 tablet (300 mg total) by mouth in the morning, at noon, and at bedtime. 10/10/21   Onita Duos, MD  pantoprazole  (PROTONIX ) 40 MG tablet Take 40 mg by mouth daily. 07/28/14   [provider]  polyethylene glycol (MIRALAX  / GLYCOLAX ) 17 g packet Take 17 g by mouth daily. Patient taking differently: Take 17 g by mouth daily as needed for mild constipation. 04/30/19   Shona Terry SAILOR, DO  sildenafil  (REVATIO ) 20 MG tablet Take 60-100 mg by mouth daily as needed (ED). 11/25/18   [provider]  sotalol  (BETAPACE ) 80 MG tablet TAKE 1 TABLET(80 MG) BY MOUTH EVERY 12 HOURS 09/21/21   [provider]  SUMAtriptan  (IMITREX ) 100 MG tablet Take 100 mg by mouth as directed. Take one tablet at onset, then an additional tablet every 12 hours as needed for migraine 02/04/20   [provider]  traMADol  (ULTRAM ) 50 MG tablet Take 1 tablet (50 mg total) by mouth every 12 (twelve) hours as needed. 01/31/23   Lovorn, Megan, MD  triamterene -hydrochlorothiazide  (DYAZIDE ) 37.5-25 MG capsule Take 1 each (1 capsule total) by mouth every morning. Patient taking differently: Take 1 capsule by mouth as needed. 08/22/21   Briana Elgin LABOR, MD    Allergies: Patient has no known allergies.    Review of Systems  Cardiovascular:  Positive for  palpitations.  Musculoskeletal:  Positive for back pain.  All other systems reviewed and are negative.   Updated Vital Signs BP (!) 149/101   Pulse (!) 49   Temp 98.4 F (36.9 C) (Oral)   Resp 17   Ht 5' 11 (1.803 m)   Wt 94.3 kg   SpO2 99%   BMI 29.01 kg/m   Physical Exam Vitals and nursing note reviewed.  Constitutional:      General: He is not in acute distress.    Appearance: He is well-developed.  HENT:     Head: Normocephalic and atraumatic.   Eyes:     Conjunctiva/sclera: Conjunctivae normal.    Cardiovascular:     Rate and Rhythm: Regular rhythm. Bradycardia present.     Pulses: Normal  pulses.     Heart sounds: Normal heart sounds. No murmur heard.    No friction rub. No gallop.  Pulmonary:     Effort: Pulmonary effort is normal. No respiratory distress.     Breath sounds: Normal breath sounds.  Abdominal:     General: There is no distension.     Palpations: Abdomen is soft.     Tenderness: There is no abdominal tenderness. There is no guarding.   Musculoskeletal:        General: Tenderness present. No swelling, deformity or signs of injury.       Arms:     Cervical back: Neck supple.     Comments: Tenderness palpation towards the right paraspinal muscles in the lumbar region.  Difficult to fully assess due to pain and difficulty with repositioning.  Positive straight leg raise on the right side.   Skin:    General: Skin is warm and dry.     Capillary Refill: Capillary refill takes less than 2 seconds.   Neurological:     Mental Status: He is alert.   Psychiatric:        Mood and Affect: Mood normal.     (all labs ordered are listed, but only abnormal results are displayed) Labs Reviewed  CBC WITH DIFFERENTIAL/PLATELET  URINALYSIS, ROUTINE W REFLEX MICROSCOPIC  COMPREHENSIVE METABOLIC PANEL WITH GFR  TROPONIN T, HIGH SENSITIVITY    EKG: EKG Interpretation Date/Time:  Wednesday October 01 2023 10:07:19 EDT Ventricular Rate:  57 PR  Interval:  257 QRS Duration:  148 QT Interval:  491 QTC Calculation: 479 R Axis:   -60  Text Interpretation: Sinus rhythm Prolonged PR interval Left bundle branch block COPY Confirmed by Towana Sharper [phone]) on 10/01/2023 10:11:33 AM  Radiology: [facility] Lumbar Spine Complete Result Date: 10/01/2023 CLINICAL DATA:  Chest tightness and back pain. EXAM: LUMBAR SPINE - COMPLETE 4+ VIEW COMPARISON:  None Available. FINDINGS: Very minimal levoconvex curvature. Minimal grade 1 anterolisthesis of L4 on L5. Alignment is otherwise anatomic. Multilevel endplate degenerative changes, worst in the lower thoracic spine, T12-L1 and L1-L2. Disc space heights are fairly well maintained. Minimal loss of disc space height at L4-5. Facet hypertrophy in the mid and lower lumbar spine. No definite pars defects. IMPRESSION: 1. No acute findings. 2. Multilevel degenerative disc disease, worst in the lower thoracic and upper lumbar spine with facet hypertrophy in the mid and lower lumbar spine. Electronically Signed   By: Newell Eke M.D.   On: 10/01/2023 12:36   DG Chest 2 View Result Date: 10/01/2023 CLINICAL DATA:  Chest tightness and back pain. EXAM: CHEST - 2 VIEW COMPARISON:  08/16/2021. FINDINGS: Trachea is midline. Heart is enlarged, stable. Minimal streaky scarring in the left lower lobe. No airspace consolidation or pleural fluid. Degenerative changes in the spine. Flowing anterior osteophytosis in the thoracic spine. IMPRESSION: No acute findings. Electronically Signed   By: Newell Eke M.D.   On: 10/01/2023 12:34     Procedures   Medications Ordered in the ED  fentaNYL  (SUBLIMAZE ) injection 50 mcg (50 mcg Intravenous Given 10/01/23 1044)                                    Medical Decision Making Amount and/or Complexity of Data Reviewed Labs: ordered. Radiology: ordered.  Risk Prescription drug management.   This patient presents to the ED for concern of back pain, palpitations.   Differential  diagnosis includes atrial fibrillation, cauda equina syndrome, lumbar radiculopathy, sciatica, urolithiasis, pyelonephritis   Lab Tests:  I Ordered, and personally interpreted labs.  The pertinent results include: CBC unremarkable, CMP unremarkable, troponin negative at less than 15, UA negative for signs of infection   Imaging Studies ordered:  I ordered imaging studies including chest x-ray, lumbar spine x-ray I independently visualized and interpreted imaging which showed 1. No acute findings on chest or lumbar views. 2. Multilevel degenerative disc disease, worst in the lower thoracic and upper lumbar spine with facet hypertrophy in the mid and lower lumbar spine. I agree with the radiologist interpretation   Medicines ordered and prescription drug management:  I ordered medication including fentanyl  for pain Reevaluation of the patient after these medicines showed that the patient improved I have reviewed the patients home medicines and have made adjustments as needed   Problem List / ED Course:  Patient with past history significant for paroxysmal atrial fibrillation, hypertension, COPD, prior cervical spinal fusion, cervical myelopathy presents to ED with concerns of back pain and palpitations.  Reports for the last week has had worsening back pain without any obvious inciting factor.  Denies any recent injury or strain.  Has been using hydrocodone  and Flexeril  without improvement in symptoms.  Denies any tingling, numbness, bowel or bladder incontinence, or saddle paresthesia with current symptoms.  Also endorsing some palpitations when he feels that he is having some increased skipping of beats.  Denies any recent nausea, vomiting, diarrhea or other concerns for dehydration. On exam, difficult to reposition patient due to pain.  Will minister dose of fentanyl  and reassess shortly.  Of note, patient does have pain towards the right flank but difficult to assess for CVA  tenderness given patient cannot reposition easily.  No significant midline tenderness palpated.  Positive straight leg raise on the right side.  No focal abdominal pain or guarding. Patient's workup is reassuring without any obvious findings seen.  I do suspect symptoms are likely secondary to lumbar radiculopathy.  Has been able to tolerate ambulation while here in the emergency department. Will discharge home with a course of muscle relaxers as well prednisone  as patient reports difficulty with NSAIDs due to prior GI bleed history. Advised close follow up with PCP for further evaluation. Otherwise stable at this time for outpatient follow up and discharged home.  Final diagnoses:  Lumbar radiculopathy    ED Discharge Orders          Ordered    methocarbamol  (ROBAXIN ) 500 MG tablet  2 times daily        10/01/23 1420    methylPREDNISolone  (MEDROL  DOSEPAK) 4 MG TBPK tablet  Daily        10/01/23 1420               Kalani Baray A, PA-C 10/01/23 1636    Towana Ozell BROCKS, MD 10/01/23 1736

## 2023-10-01 NOTE — ED Triage Notes (Signed)
 Patient arrives POV with complaints of worsening back pain x1 week. Seen by his PCP and started on medications with no relief. Also reports intermittent palpitations as well. Rates pain an 8/10.

## 2023-10-01 NOTE — ED Notes (Signed)
 ED Provider at bedside.

## 2023-10-01 NOTE — Discharge Instructions (Addendum)
 You are seen in the emergency department today for concerns of back pain and palpitations.  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  This is likely causing some level of pain that you are experiencing.  Since you are unable to take anti-inflammatories, sending home with a short prescription for a steroid burst with the neck several days.  I am also sending you with a muscle laxer called Robaxin  to help with some of the pain that you are feeling with movement in her back.  Please follow-up with your primary care provider and your neurosurgery group for repeat evaluation.  For any concerns of new or worsening symptoms, return to the emergency department.

## 2023-10-09 ENCOUNTER — Emergency Department (HOSPITAL_BASED_OUTPATIENT_CLINIC_OR_DEPARTMENT_OTHER)
Admission: EM | Admit: 2023-10-09 | Discharge: 2023-10-09 | Disposition: A | Discharge status: Home / Self Care | Attending: Emergency Medicine | Admitting: Emergency Medicine

## 2023-10-09 ENCOUNTER — Emergency Department (HOSPITAL_BASED_OUTPATIENT_CLINIC_OR_DEPARTMENT_OTHER)

## 2023-10-09 ENCOUNTER — Encounter (HOSPITAL_BASED_OUTPATIENT_CLINIC_OR_DEPARTMENT_OTHER): Payer: Self-pay | Admitting: Emergency Medicine

## 2023-10-09 ENCOUNTER — Other Ambulatory Visit: Payer: Self-pay

## 2023-10-09 DIAGNOSIS — M545 Low back pain, unspecified: Secondary | ICD-10-CM | POA: Insufficient documentation

## 2023-10-09 DIAGNOSIS — R109 Unspecified abdominal pain: Secondary | ICD-10-CM | POA: Insufficient documentation

## 2023-10-09 DIAGNOSIS — R350 Frequency of micturition: Secondary | ICD-10-CM | POA: Diagnosis not present

## 2023-10-09 DIAGNOSIS — Z7901 Long term (current) use of anticoagulants: Secondary | ICD-10-CM | POA: Diagnosis not present

## 2023-10-09 DIAGNOSIS — Z79899 Other long term (current) drug therapy: Secondary | ICD-10-CM | POA: Diagnosis not present

## 2023-10-09 LAB — URINALYSIS, MICROSCOPIC (REFLEX)

## 2023-10-09 LAB — CBC WITH DIFFERENTIAL/PLATELET
Abs Immature Granulocytes: 0.05 10*3/uL (ref 0.00–0.07)
Basophils Absolute: 0 10*3/uL (ref 0.0–0.1)
Basophils Relative: 0 %
Eosinophils Absolute: 0.2 10*3/uL (ref 0.0–0.5)
Eosinophils Relative: 2 %
HCT: 43.1 % (ref 39.0–52.0)
Hemoglobin: 14.5 g/dL (ref 13.0–17.0)
Immature Granulocytes: 1 %
Lymphocytes Relative: 19 %
Lymphs Abs: 1.6 10*3/uL (ref 0.7–4.0)
MCH: 28.5 pg (ref 26.0–34.0)
MCHC: 33.6 g/dL (ref 30.0–36.0)
MCV: 84.7 fL (ref 80.0–100.0)
Monocytes Absolute: 0.8 10*3/uL (ref 0.1–1.0)
Monocytes Relative: 9 %
Neutro Abs: 6.1 10*3/uL (ref 1.7–7.7)
Neutrophils Relative %: 69 %
Platelets: 209 10*3/uL (ref 150–400)
RBC: 5.09 MIL/uL (ref 4.22–5.81)
RDW: 12.8 % (ref 11.5–15.5)
WBC: 8.8 10*3/uL (ref 4.0–10.5)
nRBC: 0 % (ref 0.0–0.2)

## 2023-10-09 LAB — COMPREHENSIVE METABOLIC PANEL WITH GFR
ALT: 25 U/L (ref 0–44)
AST: 28 U/L (ref 15–41)
Albumin: 4.5 g/dL (ref 3.5–5.0)
Alkaline Phosphatase: 99 U/L (ref 38–126)
Anion gap: 9 (ref 5–15)
BUN: 13 mg/dL (ref 8–23)
CO2: 27 mmol/L (ref 22–32)
Calcium: 9.2 mg/dL (ref 8.9–10.3)
Chloride: 105 mmol/L (ref 98–111)
Creatinine, Ser: 1.19 mg/dL (ref 0.61–1.24)
GFR, Estimated: >60 mL/min (ref >60)
Glucose, Bld: 83 mg/dL (ref 70–99)
Potassium: 3.7 mmol/L (ref 3.5–5.1)
Sodium: 141 mmol/L (ref 135–145)
Total Bilirubin: 1.1 mg/dL (ref 0.0–1.2)
Total Protein: 7.9 g/dL (ref 6.5–8.1)

## 2023-10-09 LAB — URINALYSIS, ROUTINE W REFLEX MICROSCOPIC
Bilirubin Urine: NEGATIVE
Glucose, UA: NEGATIVE mg/dL
Ketones, ur: NEGATIVE mg/dL
Leukocytes,Ua: NEGATIVE
Nitrite: NEGATIVE
Protein, ur: NEGATIVE mg/dL
Specific Gravity, Urine: 1.015 (ref 1.005–1.030)
pH: 7.5 (ref 5.0–8.0)

## 2023-10-09 LAB — LIPASE, BLOOD: Lipase: 14 U/L (ref 11–51)

## 2023-10-09 MED ORDER — SODIUM CHLORIDE 0.9 % IV BOLUS
1000.0000 mL | Freq: Once | INTRAVENOUS | Status: AC
  Administered 2023-10-09: 1000 mL via INTRAVENOUS

## 2023-10-09 MED ORDER — LIDOCAINE 5 % EX PTCH
1.0000 | MEDICATED_PATCH | Freq: Once | CUTANEOUS | Status: DC
  Administered 2023-10-09: 1 via TRANSDERMAL
  Filled 2023-10-09: qty 1

## 2023-10-09 MED ORDER — HYDROCODONE-ACETAMINOPHEN 5-325 MG PO TABS
1.0000 | ORAL_TABLET | Freq: Once | ORAL | Status: AC
  Administered 2023-10-09: 1 via ORAL
  Filled 2023-10-09: qty 1

## 2023-10-09 MED ORDER — IOHEXOL 300 MG/ML  SOLN
100.0000 mL | Freq: Once | INTRAMUSCULAR | Status: AC | PRN
  Administered 2023-10-09: 100 mL via INTRAVENOUS

## 2023-10-09 MED ORDER — LIDOCAINE 5 % EX PTCH: 1.0000 | MEDICATED_PATCH | CUTANEOUS | 0 refills | Status: AC

## 2023-10-09 MED ORDER — HYDROCODONE-ACETAMINOPHEN 5-325 MG PO TABS: 1.0000 | ORAL_TABLET | ORAL | 0 refills | Status: DC | PRN

## 2023-10-09 MED ORDER — HYDROMORPHONE HCL 1 MG/ML IJ SOLN
1.0000 mg | Freq: Once | INTRAMUSCULAR | Status: AC
  Administered 2023-10-09: 1 mg via INTRAVENOUS
  Filled 2023-10-09: qty 1

## 2023-10-09 NOTE — ED Triage Notes (Signed)
 Lower back pain continues.  Pt was seen here and treated for same on 10/01/23.  Pt states he has been taking the medications as prescribed but is not improving.  Pt states he feels sob due to the pain.

## 2023-10-09 NOTE — ED Provider Notes (Signed)
 [place] [facility] Provider Note   CSN: [id number] Arrival date & time: 10/09/23  1020     Patient presents with: Back Pain   [name] is a 63 y.o. male.   HPI 63 year old male presents with severe low back pain.  Symptoms started about a week ago.  He was seen here and discharged.  He was treated with a muscle relaxer.  The pain has been present ever since he left the ER but then it became severely worse when he woke up this morning.  Pain is primarily in his low back in the midline and on the right side.  Occasionally it will go to the left side.  Seems to wrap around both sides to his abdomen.  No fevers, vomiting.  Has been having urinary frequency ever since it started.  No dysuria.  He has also been constipated.  No chest pain or shortness of breath though the pain will become severe enough that it will take his breath away at times.  The pain has been radiating down his left thigh as well but no weakness or numbness in his extremities.  No incontinence.  Movement makes it worse.  Prior to Admission medications   Medication Sig Start Date End Date Taking? Authorizing Provider  HYDROcodone -acetaminophen  (NORCO/VICODIN) 5-325 MG tablet Take 1 tablet by mouth every 4 (four) hours as needed for severe pain (pain score 7-10). 10/09/23  Yes Freddi Hamilton, MD  lidocaine  (LIDODERM ) 5 % Place 1 patch onto the skin daily. Remove & Discard patch within 12 hours or as directed by MD 10/09/23  Yes Freddi Hamilton, MD  acetaminophen  (TYLENOL ) 325 MG tablet Take 650 mg by mouth every 6 (six) hours as needed for moderate pain.    [provider]  amLODipine  (NORVASC ) 5 MG tablet Take 1 tablet (5 mg total) by mouth daily. 04/30/19 11/01/22  Shona Terry SAILOR, DO  apixaban  (ELIQUIS ) 5 MG TABS tablet Take 1 tablet (5 mg total) by mouth 2 (two) times daily. Take Eliquis  5 mg twice a day one week after GI bleed.  Do not restart Xarelto. Patient taking  differently: Take 5 mg by mouth 2 (two) times daily. 05/06/19   Shona Terry SAILOR, DO  atorvastatin  (LIPITOR) 10 MG tablet Take 10 mg by mouth daily. 08/18/21   [provider]  cetirizine (ZYRTEC) 10 MG tablet Take 10 mg by mouth daily as needed for allergies.     [provider]  Colchicine 0.6 MG CAPS Take 0.6 mg by mouth 2 (two) times daily as needed (gout flare). 01/17/20   [provider]  cyclobenzaprine  (FLEXERIL ) 10 MG tablet Take 1 tablet (10 mg total) by mouth 3 (three) times daily as needed for muscle spasms. 08/02/22   Lovorn, Megan, MD  diclofenac  (VOLTAREN ) 75 MG EC tablet TAKE 1 TABLET(75 MG) BY MOUTH TWICE DAILY 11/20/21   [provider]  famotidine  (PEPCID ) 20 MG tablet Take 40 mg by mouth daily as needed for heartburn or indigestion.    [provider]  ferrous sulfate  325 (65 FE) MG tablet Take 1 tablet (325 mg total) by mouth daily with breakfast. 04/30/19 11/01/22  Shona Terry SAILOR, DO  fluticasone  (FLONASE ) 50 MCG/ACT nasal spray Place 1 spray into both nostrils daily as needed for allergies or rhinitis.    [provider]  Fluticasone -Salmeterol (ADVAIR) 250-50 MCG/DOSE AEPB Inhale 1 puff into the lungs 2 (two) times daily as needed (wheezing).    [provider]  guaiFENesin  (MUCINEX ) 600 MG 12 hr tablet Take 600 mg by mouth daily as needed for to loosen phlegm. 05/18/21   [provider]  hydrocortisone  (ANUSOL -HC) 25 MG suppository Place 1 suppository (25 mg total) rectally 2 (two) times daily. For 7 days 09/15/21   Palumbo, April, MD  isosorbide  mononitrate (IMDUR ) 30 MG 24 hr tablet Take 30 mg by mouth daily. 02/16/21   [provider]  lidocaine  (XYLOCAINE ) 2 % jelly Apply small amounts to anus as needed for pain up to tid for 2 weeks. 12/03/21   [provider]  linaclotide  (LINZESS ) 145 MCG CAPS capsule Take 145 mcg by mouth daily as needed (Constipation). 10/07/19   [provider]   methocarbamol  (ROBAXIN ) 500 MG tablet Take 1 tablet (500 mg total) by mouth 2 (two) times daily. 10/01/23   Zelaya, Oscar A, PA-C  nitroGLYCERIN  (NITROSTAT ) 0.4 MG SL tablet Place 1 tablet (0.4 mg total) under the tongue every 5 (five) minutes as needed for chest pain. 03/12/13   Singh, Prashant K, MD  NON FORMULARY CPAP at bedtime    [provider]  ondansetron  (ZOFRAN ) 8 MG tablet Take 8 mg by mouth every 8 (eight) hours as needed for nausea or vomiting.  10/20/18   [provider]  Oxcarbazepine  (TRILEPTAL ) 300 MG tablet Take 1 tablet (300 mg total) by mouth in the morning, at noon, and at bedtime. 10/10/21   Onita Duos, MD  pantoprazole  (PROTONIX ) 40 MG tablet Take 40 mg by mouth daily. 07/28/14   [provider]  polyethylene glycol (MIRALAX  / GLYCOLAX ) 17 g packet Take 17 g by mouth daily. Patient taking differently: Take 17 g by mouth daily as needed for mild constipation. 04/30/19   Shona Terry SAILOR, DO  sildenafil  (REVATIO ) 20 MG tablet Take 60-100 mg by mouth daily as needed (ED). 11/25/18   [provider]  sotalol  (BETAPACE ) 80 MG tablet TAKE 1 TABLET(80 MG) BY MOUTH EVERY 12 HOURS 09/21/21   [provider]  SUMAtriptan  (IMITREX ) 100 MG tablet Take 100 mg by mouth as directed. Take one tablet at onset, then an additional tablet every 12 hours as needed for migraine 02/04/20   [provider]  traMADol  (ULTRAM ) 50 MG tablet Take 1 tablet (50 mg total) by mouth every 12 (twelve) hours as needed. 01/31/23   Lovorn, Megan, MD  triamterene -hydrochlorothiazide  (DYAZIDE ) 37.5-25 MG capsule Take 1 each (1 capsule total) by mouth every morning. Patient taking differently: Take 1 capsule by mouth as needed. 08/22/21   Briana Elgin LABOR, MD    Allergies: Patient has no known allergies.    Review of Systems  Constitutional:  Negative for fever.  Respiratory:  Negative for shortness of breath.   Cardiovascular:  Negative for chest pain.   Gastrointestinal:  Positive for abdominal pain and constipation. Negative for vomiting.  Genitourinary:  Positive for frequency. Negative for dysuria.  Musculoskeletal:  Positive for back pain.  Neurological:  Negative for weakness and numbness.    Updated Vital Signs BP (!) 152/107   Pulse 69   Temp 98 F (36.7 C) (Oral)   Resp 13   SpO2 92%   Physical Exam Vitals and nursing note reviewed.  Constitutional:      Appearance: He is well-developed.  HENT:     Head: Normocephalic and atraumatic.  Cardiovascular:     Rate and Rhythm: Normal rate and regular rhythm.     Heart sounds: Normal heart sounds.  Pulmonary:  Effort: Pulmonary effort is normal.     Breath sounds: Normal breath sounds.  Abdominal:     Palpations: Abdomen is soft.     Tenderness: There is no abdominal tenderness.  Musculoskeletal:     Thoracic back: No tenderness.     Lumbar back: Tenderness present.       Back:  Skin:    General: Skin is warm and dry.  Neurological:     Mental Status: He is alert.     Comments: 5/5 strength in both lower extremities, grossly normal sensation.     (all labs ordered are listed, but only abnormal results are displayed) Labs Reviewed  URINALYSIS, ROUTINE W REFLEX MICROSCOPIC - Abnormal; Notable for the following components:      Result Value   Hgb urine dipstick TRACE (*)    All other components within normal limits  URINALYSIS, MICROSCOPIC (REFLEX) - Abnormal; Notable for the following components:   Bacteria, UA RARE (*)    All other components within normal limits  COMPREHENSIVE METABOLIC PANEL WITH GFR  LIPASE, BLOOD  CBC WITH DIFFERENTIAL/PLATELET    EKG: EKG Interpretation Date/Time:  Thursday October 09 2023 10:34:35 EDT Ventricular Rate:  70 PR Interval:  243 QRS Duration:  143 QT Interval:  440 QTC Calculation: 475 R Axis:   -75  Text Interpretation: Sinus rhythm Prolonged PR interval Left atrial enlargement Left bundle branch block no  significant change since Jun 2025 Confirmed by Freddi Hamilton [phone]) on 10/09/2023 10:39:03 AM  Radiology: CT ABDOMEN PELVIS W CONTRAST Result Date: 10/09/2023 CLINICAL DATA:  Low back pain.  Concern for retroperitoneal abscess. EXAM: CT ABDOMEN AND PELVIS WITH CONTRAST TECHNIQUE: Multidetector CT imaging of the abdomen and pelvis was performed using the standard protocol following bolus administration of intravenous contrast. RADIATION DOSE REDUCTION: This exam was performed according to the departmental dose-optimization program which includes automated exposure control, adjustment of the mA and/or kV according to patient size and/or use of iterative reconstruction technique. CONTRAST:  OMNIPAQUE  IOHEXOL  300 MG/ML  SOLN COMPARISON:  Pelvic CT dated 09/14/2021. FINDINGS: Lower chest: The visualized lung bases are clear. No intra-abdominal free air or free fluid. Hepatobiliary: No focal liver abnormality is seen. No gallstones, gallbladder wall thickening, or biliary dilatation. Pancreas: Unremarkable. No pancreatic ductal dilatation or surrounding inflammatory changes. Spleen: Normal in size without focal abnormality. Adrenals/Urinary Tract: The adrenal glands are unremarkable. There is a small left renal interpolar cyst. There is no hydronephrosis on either side. There is symmetric enhancement and excretion of contrast by both kidneys. The visualized ureters and urinary bladder appear unremarkable. Stomach/Bowel: There is severe sigmoid diverticulosis and diffuse colonic diverticula. There is no bowel obstruction or active inflammation. The appendix is normal. Vascular/Lymphatic: Mild aortoiliac atherosclerotic disease. The IVC is unremarkable. No portal venous gas. There is no adenopathy. Reproductive: The prostate and seminal vesicles are grossly unremarkable. No pelvic mass. Other: None Musculoskeletal: Degenerative changes of the spine. Grade 1 L4-L5 anterolisthesis. No acute osseous pathology.  IMPRESSION: 1. No acute intra-abdominal or pelvic pathology. No retroperitoneal abscess. 2. Severe sigmoid diverticulosis. No bowel obstruction. Normal appendix. 3.  Aortic Atherosclerosis (ICD10-I70.0). Electronically Signed   By: Vanetta Chou M.D.   On: 10/09/2023 12:52     Procedures   Medications Ordered in the ED  lidocaine  (LIDODERM ) 5 % 1 patch (1 patch Transdermal Patch Applied 10/09/23 1341)  sodium chloride  0.9 % bolus 1,000 mL ( Intravenous Stopped 10/09/23 1249)  HYDROmorphone  (DILAUDID ) injection 1 mg (1 mg Intravenous Given 10/09/23  1113)  iohexol  (OMNIPAQUE ) 300 MG/ML solution 100 mL (100 mLs Intravenous Contrast Given 10/09/23 1209)  HYDROcodone -acetaminophen  (NORCO/VICODIN) 5-325 MG per tablet 1 tablet (1 tablet Oral Given 10/09/23 1342)                                    Medical Decision Making Amount and/or Complexity of Data Reviewed Labs: ordered.    Details: Normal WBC, normal hemoglobin Radiology: ordered and independent interpretation performed.    Details: No retroperitoneal hematoma ECG/medicine tests: ordered and independent interpretation performed.    Details: LBBB  Risk Prescription drug management.   Patient presents with worsening back pain.  No red flags such as weakness or numbness in the extremities, fever, incontinence.  Patient was given Dilaudid  with significant improvement in pain.  CT was obtained as he is on a blood thinner and is having nontraumatic and worsening pain.  CT is reassuring including no hematoma or obvious other intra-abdominal/retroperitoneal emergency.  Labs are reassuring.  Urine is negative.  He is feeling better and so he will be discharged with a short course of hydrocodone  (he ran out of his most recent course earlier today).  He just finished his steroid course I do not think further steroids will be needed or helpful.  Will give a Lidoderm  patch.  Unfortunately he cannot do NSAIDs as he is on a blood thinner.  Otherwise my  suspicion of a spinal cord emergency is low with no red flags I do not think an MRI is needed.  Will refer him to his PCP and treat his pain as an outpatient, and if he develops new or worsening symptoms he was instructed to return to the ER.     Final diagnoses:  Acute right-sided low back pain, unspecified whether sciatica present    ED Discharge Orders          Ordered    HYDROcodone -acetaminophen  (NORCO/VICODIN) 5-325 MG tablet  Every 4 hours PRN        10/09/23 1325    lidocaine  (LIDODERM ) 5 %  Every 24 hours        10/09/23 1325               Freddi Hamilton, MD 10/09/23 1442

## 2023-10-09 NOTE — ED Notes (Signed)
 ED Provider at bedside.

## 2023-10-09 NOTE — Discharge Instructions (Addendum)
 Follow-up closely with your primary care physician.  You may use Tylenol  in addition to the hydrocodone  but have caution as the hydrocodone  does have some Tylenol  in it.  Call your doctor for urgent follow-up on Monday.  If you develop fever, new or worsening or uncontrolled back pain, incontinence, weakness or numbness in your legs, or any other new/concerning symptoms then return to the ER.

## 2023-10-16 ENCOUNTER — Other Ambulatory Visit: Payer: Self-pay | Admitting: Student

## 2023-10-16 DIAGNOSIS — M5416 Radiculopathy, lumbar region: Secondary | ICD-10-CM

## 2023-10-19 ENCOUNTER — Inpatient Hospital Stay
Admission: RE | Admit: 2023-10-19 | Discharge: 2023-10-19 | Discharge status: Home / Self Care | Source: Ambulatory Visit | Attending: Student

## 2023-10-19 DIAGNOSIS — M5416 Radiculopathy, lumbar region: Secondary | ICD-10-CM

## 2023-11-07 ENCOUNTER — Other Ambulatory Visit: Payer: Self-pay | Admitting: Neurosurgery

## 2023-11-17 NOTE — Progress Notes (Signed)
Pt informed

## 2023-11-19 ENCOUNTER — Other Ambulatory Visit: Payer: Self-pay

## 2023-11-19 ENCOUNTER — Emergency Department (HOSPITAL_BASED_OUTPATIENT_CLINIC_OR_DEPARTMENT_OTHER)
Admission: EM | Admit: 2023-11-19 | Discharge: 2023-11-19 | Disposition: A | Discharge status: Home / Self Care | Attending: Emergency Medicine | Admitting: Emergency Medicine

## 2023-11-19 ENCOUNTER — Encounter (HOSPITAL_BASED_OUTPATIENT_CLINIC_OR_DEPARTMENT_OTHER): Payer: Self-pay | Admitting: Emergency Medicine

## 2023-11-19 ENCOUNTER — Emergency Department (HOSPITAL_BASED_OUTPATIENT_CLINIC_OR_DEPARTMENT_OTHER)

## 2023-11-19 DIAGNOSIS — I7121 Aneurysm of the ascending aorta, without rupture: Secondary | ICD-10-CM | POA: Diagnosis not present

## 2023-11-19 DIAGNOSIS — J449 Chronic obstructive pulmonary disease, unspecified: Secondary | ICD-10-CM | POA: Diagnosis not present

## 2023-11-19 DIAGNOSIS — I480 Paroxysmal atrial fibrillation: Secondary | ICD-10-CM | POA: Insufficient documentation

## 2023-11-19 DIAGNOSIS — I129 Hypertensive chronic kidney disease with stage 1 through stage 4 chronic kidney disease, or unspecified chronic kidney disease: Secondary | ICD-10-CM | POA: Insufficient documentation

## 2023-11-19 DIAGNOSIS — I2510 Atherosclerotic heart disease of native coronary artery without angina pectoris: Secondary | ICD-10-CM | POA: Insufficient documentation

## 2023-11-19 DIAGNOSIS — I700 Atherosclerosis of aorta: Secondary | ICD-10-CM | POA: Insufficient documentation

## 2023-11-19 DIAGNOSIS — N189 Chronic kidney disease, unspecified: Secondary | ICD-10-CM | POA: Diagnosis not present

## 2023-11-19 DIAGNOSIS — Z7901 Long term (current) use of anticoagulants: Secondary | ICD-10-CM | POA: Insufficient documentation

## 2023-11-19 DIAGNOSIS — Z7951 Long term (current) use of inhaled steroids: Secondary | ICD-10-CM | POA: Diagnosis not present

## 2023-11-19 DIAGNOSIS — R091 Pleurisy: Secondary | ICD-10-CM | POA: Diagnosis not present

## 2023-11-19 DIAGNOSIS — R0789 Other chest pain: Secondary | ICD-10-CM | POA: Diagnosis present

## 2023-11-19 DIAGNOSIS — N400 Enlarged prostate without lower urinary tract symptoms: Secondary | ICD-10-CM | POA: Insufficient documentation

## 2023-11-19 DIAGNOSIS — K5730 Diverticulosis of large intestine without perforation or abscess without bleeding: Secondary | ICD-10-CM | POA: Diagnosis not present

## 2023-11-19 DIAGNOSIS — Z79899 Other long term (current) drug therapy: Secondary | ICD-10-CM | POA: Insufficient documentation

## 2023-11-19 DIAGNOSIS — E042 Nontoxic multinodular goiter: Secondary | ICD-10-CM | POA: Diagnosis not present

## 2023-11-19 LAB — RESP PANEL BY RT-PCR (RSV, FLU A&B, COVID)  RVPGX2
Influenza A by PCR: NEGATIVE
Influenza B by PCR: NEGATIVE
Resp Syncytial Virus by PCR: NEGATIVE
SARS Coronavirus 2 by RT PCR: NEGATIVE

## 2023-11-19 LAB — BASIC METABOLIC PANEL WITH GFR
Anion gap: 10 (ref 5–15)
BUN: 11 mg/dL (ref 8–23)
CO2: 26 mmol/L (ref 22–32)
Calcium: 9.7 mg/dL (ref 8.9–10.3)
Chloride: 108 mmol/L (ref 98–111)
Creatinine, Ser: 1.24 mg/dL (ref 0.61–1.24)
GFR, Estimated: >60 mL/min (ref >60)
Glucose, Bld: 95 mg/dL (ref 70–99)
Potassium: 4 mmol/L (ref 3.5–5.1)
Sodium: 144 mmol/L (ref 135–145)

## 2023-11-19 LAB — CBC
HCT: 40.9 % (ref 39.0–52.0)
Hemoglobin: 13.6 g/dL (ref 13.0–17.0)
MCH: 28.5 pg (ref 26.0–34.0)
MCHC: 33.3 g/dL (ref 30.0–36.0)
MCV: 85.6 fL (ref 80.0–100.0)
Platelets: 219 K/uL (ref 150–400)
RBC: 4.78 MIL/uL (ref 4.22–5.81)
RDW: 12.9 % (ref 11.5–15.5)
WBC: 5.8 K/uL (ref 4.0–10.5)
nRBC: 0 % (ref 0.0–0.2)

## 2023-11-19 LAB — TROPONIN T, HIGH SENSITIVITY: Troponin T High Sensitivity: <15 ng/L (ref 0–19)

## 2023-11-19 LAB — D-DIMER, QUANTITATIVE: D-Dimer, Quant: 0.29 ug{FEU}/mL (ref 0.00–0.50)

## 2023-11-19 LAB — PRO BRAIN NATRIURETIC PEPTIDE: Pro Brain Natriuretic Peptide: 116 pg/mL (ref <300.0)

## 2023-11-19 MED ORDER — FENTANYL CITRATE PF 50 MCG/ML IJ SOSY
50.0000 ug | PREFILLED_SYRINGE | Freq: Once | INTRAMUSCULAR | Status: AC
  Administered 2023-11-19 (×2): 50 ug via INTRAVENOUS
  Filled 2023-11-19: qty 1

## 2023-11-19 MED ORDER — FAMOTIDINE 20 MG PO TABS
20.0000 mg | ORAL_TABLET | Freq: Once | ORAL | Status: AC
  Administered 2023-11-19 (×2): 20 mg via ORAL
  Filled 2023-11-19: qty 1

## 2023-11-19 MED ORDER — ALUM & MAG HYDROXIDE-SIMETH 200-200-20 MG/5ML PO SUSP
30.0000 mL | Freq: Once | ORAL | Status: AC
  Administered 2023-11-19 (×2): 30 mL via ORAL
  Filled 2023-11-19: qty 30

## 2023-11-19 MED ORDER — IOHEXOL 350 MG/ML SOLN
85.0000 mL | Freq: Once | INTRAVENOUS | Status: AC | PRN
  Administered 2023-11-19 (×2): 85 mL via INTRAVENOUS

## 2023-11-19 MED ORDER — MORPHINE SULFATE (PF) 4 MG/ML IV SOLN: 4.0000 mg | Freq: Once | INTRAVENOUS | Status: DC

## 2023-11-19 MED ORDER — LIDOCAINE VISCOUS HCL 2 % MT SOLN
15.0000 mL | Freq: Once | OROMUCOSAL | Status: AC
  Administered 2023-11-19 (×2): 15 mL via ORAL
  Filled 2023-11-19: qty 15

## 2023-11-19 NOTE — ED Provider Notes (Signed)
  EMERGENCY DEPARTMENT AT [facility] [place] Provider Note   CSN: [id number] Arrival date & time: 11/19/23  0845     Patient presents with: No chief complaint on file.   [name] is a 63 y.o. male.  {Add pertinent medical, surgical, social history, OB history to HPI:32986} HPI   63 year old male with medical history significant for paroxysmal atrial fibrillation on Eliquis  status post ablation, CKD, GI bleeding, COPD, HTN, OSA, SVT, pulmonary hypertension presents to the emergency department with roughly 3 days of left-sided chest pain.  The patient endorses sharp chest discomfort, worse with taking a deep breath, radiating to the left shoulder and neck.  He denies shortness of breath, denies any cough, fever or chills.  He takes Eliquis  and has been compliant with the medication.  He denies any nausea, vomiting, diaphoresis.  No known sick contacts.  Pain is not worse with movement, not better or worse with sitting up or leaning forward.  It has persisted for the past 3 days prompting his presentation to the emergency department.  He feels that it is worsening.  It is not ripping or tearing or radiating to the back.  Prior to Admission medications   Medication Sig Start Date End Date Taking? Authorizing Provider  acetaminophen  (TYLENOL ) 325 MG tablet Take 650 mg by mouth every 6 (six) hours as needed for moderate pain.    [provider]  amLODipine  (NORVASC ) 5 MG tablet Take 1 tablet (5 mg total) by mouth daily. 04/30/19 11/01/22  Shona Terry SAILOR, DO  apixaban  (ELIQUIS ) 5 MG TABS tablet Take 1 tablet (5 mg total) by mouth 2 (two) times daily. Take Eliquis  5 mg twice a day one week after GI bleed.  Do not restart Xarelto. Patient taking differently: Take 5 mg by mouth 2 (two) times daily. 05/06/19   Shona Terry SAILOR, DO  atorvastatin  (LIPITOR) 10 MG tablet Take 10 mg by mouth daily. 08/18/21   [provider]  cetirizine (ZYRTEC) 10 MG tablet Take 10 mg by  mouth daily as needed for allergies.     [provider]  Colchicine 0.6 MG CAPS Take 0.6 mg by mouth 2 (two) times daily as needed (gout flare). 01/17/20   [provider]  cyclobenzaprine  (FLEXERIL ) 10 MG tablet Take 1 tablet (10 mg total) by mouth 3 (three) times daily as needed for muscle spasms. 08/02/22   Lovorn, Megan, MD  diclofenac  (VOLTAREN ) 75 MG EC tablet TAKE 1 TABLET(75 MG) BY MOUTH TWICE DAILY 11/20/21   [provider]  famotidine  (PEPCID ) 20 MG tablet Take 40 mg by mouth daily as needed for heartburn or indigestion.    [provider]  ferrous sulfate  325 (65 FE) MG tablet Take 1 tablet (325 mg total) by mouth daily with breakfast. 04/30/19 11/01/22  Shona Terry SAILOR, DO  fluticasone  (FLONASE ) 50 MCG/ACT nasal spray Place 1 spray into both nostrils daily as needed for allergies or rhinitis.    [provider]  Fluticasone -Salmeterol (ADVAIR) 250-50 MCG/DOSE AEPB Inhale 1 puff into the lungs 2 (two) times daily as needed (wheezing).    [provider]  guaiFENesin  (MUCINEX ) 600 MG 12 hr tablet Take 600 mg by mouth daily as needed for to loosen phlegm. 05/18/21   [provider]  HYDROcodone -acetaminophen  (NORCO/VICODIN) 5-325 MG tablet Take 1 tablet by mouth every 4 (four) hours as needed for severe pain (pain score 7-10). 10/09/23   Freddi Hamilton, MD  hydrocortisone  (ANUSOL -HC) 25 MG suppository Place 1  suppository (25 mg total) rectally 2 (two) times daily. For 7 days 09/15/21   Nettie, April, MD  isosorbide  mononitrate (IMDUR ) 30 MG 24 hr tablet Take 30 mg by mouth daily. 02/16/21   [provider]  lidocaine  (LIDODERM ) 5 % Place 1 patch onto the skin daily. Remove & Discard patch within 12 hours or as directed by MD 10/09/23   Freddi Hamilton, MD  lidocaine  (XYLOCAINE ) 2 % jelly Apply small amounts to anus as needed for pain up to tid for 2 weeks. 12/03/21   [provider]  linaclotide  (LINZESS ) 145 MCG CAPS  capsule Take 145 mcg by mouth daily as needed (Constipation). 10/07/19   [provider]  methocarbamol  (ROBAXIN ) 500 MG tablet Take 1 tablet (500 mg total) by mouth 2 (two) times daily. 10/01/23   Zelaya, Oscar A, PA-C  nitroGLYCERIN  (NITROSTAT ) 0.4 MG SL tablet Place 1 tablet (0.4 mg total) under the tongue every 5 (five) minutes as needed for chest pain. 03/12/13   Singh, Prashant K, MD  NON FORMULARY CPAP at bedtime    [provider]  ondansetron  (ZOFRAN ) 8 MG tablet Take 8 mg by mouth every 8 (eight) hours as needed for nausea or vomiting.  10/20/18   [provider]  Oxcarbazepine  (TRILEPTAL ) 300 MG tablet Take 1 tablet (300 mg total) by mouth in the morning, at noon, and at bedtime. 10/10/21   Onita Duos, MD  pantoprazole  (PROTONIX ) 40 MG tablet Take 40 mg by mouth daily. 07/28/14   [provider]  polyethylene glycol (MIRALAX  / GLYCOLAX ) 17 g packet Take 17 g by mouth daily. Patient taking differently: Take 17 g by mouth daily as needed for mild constipation. 04/30/19   Shona Terry SAILOR, DO  sildenafil  (REVATIO ) 20 MG tablet Take 60-100 mg by mouth daily as needed (ED). 11/25/18   [provider]  sotalol  (BETAPACE ) 80 MG tablet TAKE 1 TABLET(80 MG) BY MOUTH EVERY 12 HOURS 09/21/21   [provider]  SUMAtriptan  (IMITREX ) 100 MG tablet Take 100 mg by mouth as directed. Take one tablet at onset, then an additional tablet every 12 hours as needed for migraine 02/04/20   [provider]  traMADol  (ULTRAM ) 50 MG tablet Take 1 tablet (50 mg total) by mouth every 12 (twelve) hours as needed. 01/31/23   Lovorn, Megan, MD  triamterene -hydrochlorothiazide  (DYAZIDE ) 37.5-25 MG capsule Take 1 each (1 capsule total) by mouth every morning. Patient taking differently: Take 1 capsule by mouth as needed. 08/22/21   Briana Elgin LABOR, MD    Allergies: Patient has no known allergies.    Review of Systems  All other systems reviewed and are  negative.   Updated Vital Signs There were no vitals taken for this visit.  Physical Exam Vitals and nursing note reviewed.  Constitutional:      General: He is not in acute distress.    Appearance: He is well-developed.  HENT:     Head: Normocephalic and atraumatic.  Eyes:     Conjunctiva/sclera: Conjunctivae normal.  Cardiovascular:     Rate and Rhythm: Normal rate and regular rhythm.     Heart sounds: Normal heart sounds. No murmur heard.    No friction rub.  Pulmonary:     Effort: Pulmonary effort is normal. No respiratory distress.     Breath sounds: Normal breath sounds.  Chest:     Comments: Chest wall without rash, nontender to palpation Abdominal:     Palpations: Abdomen is soft.  Tenderness: There is no abdominal tenderness.  Musculoskeletal:        General: No swelling.     Cervical back: Neck supple.     Right lower leg: No edema.     Left lower leg: No edema.  Skin:    General: Skin is warm and dry.     Capillary Refill: Capillary refill takes less than 2 seconds.  Neurological:     Mental Status: He is alert.  Psychiatric:        Mood and Affect: Mood normal.     (all labs ordered are listed, but only abnormal results are displayed) Labs Reviewed - No data to display  EKG: None  Radiology: No results found.  {Document cardiac monitor, telemetry assessment procedure when appropriate:32947} Procedures   Medications Ordered in the ED - No data to display    {Click here for ABCD2, HEART and other calculators REFRESH Note before signing:1}                              Medical Decision Making Amount and/or Complexity of Data Reviewed Labs: ordered. Radiology: ordered.  Risk OTC drugs. Prescription drug management.    63 year old male with medical history significant for paroxysmal atrial fibrillation on Eliquis  status post ablation, CKD, GI bleeding, COPD, HTN, OSA, SVT, pulmonary hypertension presents to the emergency department with  roughly 3 days of left-sided chest pain.  The patient endorses sharp chest discomfort, worse with taking a deep breath, radiating to the left shoulder and neck.  He denies shortness of breath, denies any cough, fever or chills.  He takes Eliquis  and has been compliant with the medication.  He denies any nausea, vomiting, diaphoresis.  No known sick contacts.  Pain is not worse with movement, not better or worse with sitting up or leaning forward.  It has persisted for the past 3 days prompting his presentation to the emergency department.  He feels that it is worsening.  It is not ripping or tearing or radiating to the back.  ***  {Document critical care time when appropriate  Document review of labs and clinical decision tools ie [id number], etc  Document your independent review of radiology images and any outside records  Document your discussion with family members, caretakers and with consultants  Document social determinants of health affecting pt's care  Document your decision making why or why not admission, treatments were needed:32947:::1}   Final diagnoses:  None    ED Discharge Orders     None

## 2023-11-19 NOTE — ED Notes (Signed)

## 2023-11-19 NOTE — Discharge Instructions (Addendum)
 Your workup to include EKG, laboratory evaluation and CT imaging was overall reassuring.  Your symptoms are consistent with pleuritis which can often be caused by a viral infection and is inflammation of the lining of your lungs.  Take opiates and Tylenol  for pain control.  You are on Eliquis  therefore NSAIDs are contraindicated as this would increase your risk for bleeding.

## 2023-11-19 NOTE — ED Triage Notes (Signed)
 Left sided chest pain, radiating to left shoulder and neck area.  No sob but hard to take a deep breath.  No N/V.  Some lightheadedness.  Pain has been constant for 3 days, seems to be worsening.

## 2023-11-26 NOTE — Pre-Procedure Instructions (Addendum)
 Surgical Instructions   Your procedure is scheduled on December 02, 2023. Report to [facility] Main Entrance A at 9:00 A.M., then check in with the Admitting office. Any questions or running late day of surgery: call [phone]  Questions prior to your surgery date: call [phone], Monday-Friday, 8am-4pm. If you experience any cold or flu symptoms such as cough, fever, chills, shortness of breath, etc. between now and your scheduled surgery, please notify us  at the above number.     Remember:  Do not eat or drink after midnight the night before your surgery    Take these medicines the morning of surgery with A SIP OF WATER: amLODipine  (NORVASC )  atorvastatin  (LIPITOR)  pantoprazole  (PROTONIX )  sotalol  (BETAPACE )  tamsulosin  (FLOMAX )    May take these medicines IF NEEDED: acetaminophen  (TYLENOL )  albuterol  (VENTOLIN  HFA) inhaler - please bring inhaler with you morning of surgery cetirizine (ZYRTEC)  Colchicine  cyclobenzaprine  (FLEXERIL )  fluticasone  (FLONASE ) nasal spray  Fluticasone -Salmeterol (ADVAIR) inhaler  HYDROcodone -acetaminophen  (NORCO/VICODIN)  linaclotide  (LINZESS )  nitroGLYCERIN  (NITROSTAT ) - if dose taken prior to surgery, please cal [phone] ondansetron  (ZOFRAN )  SUMAtriptan  (IMITREX )  traMADol  (ULTRAM )    Follow your surgeon's instructions on when to stop apixaban  (ELIQUIS ).  If no instructions were given by your surgeon then you will need to call the office to get those instructions.     One week prior to surgery, STOP taking any Aspirin  (unless otherwise instructed by your surgeon) Aleve , Naproxen , Ibuprofen, Motrin, Advil, Goody's, BC's, all herbal medications, fish oil, and non-prescription vitamins. This includes your medication: diclofenac  (VOLTAREN ) tablet                      Do NOT Smoke (Tobacco/Vaping) for 24 hours prior to your procedure.  If you use a CPAP at night, you may bring your mask/headgear for your overnight stay.   You  will be asked to remove any contacts, glasses, piercing's, hearing aid's, dentures/partials prior to surgery. Please bring cases for these items if needed.    Patients discharged the day of surgery will not be allowed to drive home, and someone needs to stay with them for 24 hours.  SURGICAL WAITING ROOM VISITATION Patients may have no more than 2 support people in the waiting area - these visitors may rotate.   Pre-op nurse will coordinate an appropriate time for 1 ADULT support person, who may not rotate, to accompany patient in pre-op.  Children under the age of 32 must have an adult with them who is not the patient and must remain in the main waiting area with an adult.  If the patient needs to stay at the hospital during part of their recovery, the visitor guidelines for inpatient rooms apply.  Please refer to the [facility] website for the visitor guidelines for any additional information.   If you received a COVID test during your pre-op visit  it is requested that you wear a mask when out in public, stay away from anyone that may not be feeling well and notify your surgeon if you develop symptoms. If you have been in contact with anyone that has tested positive in the last 10 days please notify you surgeon.      Pre-operative 5 CHG Bathing Instructions   You can play a key role in reducing the risk of infection after surgery. Your skin needs to be as free of germs as possible. You can reduce the number of germs on your skin by washing with CHG (  chlorhexidine  gluconate) soap before surgery. CHG is an antiseptic soap that kills germs and continues to kill germs even after washing.   DO NOT use if you have an allergy to chlorhexidine /CHG or antibacterial soaps. If your skin becomes reddened or irritated, stop using the CHG and notify one of our RNs at [phone].   Please shower with the CHG soap starting 4 days before surgery using the following schedule:     Please keep in mind  the following:  DO NOT shave, including legs and underarms, starting the day of your first shower.   You may shave your face at any point before/day of surgery.  Place clean sheets on your bed the day you start using CHG soap. Use a clean washcloth (not used since being washed) for each shower. DO NOT sleep with pets once you start using the CHG.   CHG Shower Instructions:  Wash your face and private area with normal soap. If you choose to wash your hair, wash first with your normal shampoo.  After you use shampoo/soap, rinse your hair and body thoroughly to remove shampoo/soap residue.  Turn the water OFF and apply about 3 tablespoons (45 ml) of CHG soap to a CLEAN washcloth.  Apply CHG soap ONLY FROM YOUR NECK DOWN TO YOUR TOES (washing for 3-5 minutes)  DO NOT use CHG soap on face, private areas, open wounds, or sores.  Pay special attention to the area where your surgery is being performed.  If you are having back surgery, having someone wash your back for you may be helpful. Wait 2 minutes after CHG soap is applied, then you may rinse off the CHG soap.  Pat dry with a clean towel  Put on clean clothes/pajamas   If you choose to wear lotion, please use ONLY the CHG-compatible lotions that are listed below.  Additional instructions for the day of surgery: DO NOT APPLY any lotions, deodorants, cologne, or perfumes.   Do not bring valuables to the hospital. [facility] is not responsible for any belongings/valuables. Do not wear nail polish, gel polish, artificial nails, or any other type of covering on natural nails (fingers and toes) Do not wear jewelry or makeup Put on clean/comfortable clothes.  Please brush your teeth.  Ask your nurse before applying any prescription medications to the skin.     CHG Compatible Lotions   Aveeno Moisturizing lotion  Cetaphil Moisturizing Cream  Cetaphil Moisturizing Lotion  Clairol Herbal Essence Moisturizing Lotion, Dry Skin  Clairol Herbal  Essence Moisturizing Lotion, Extra Dry Skin  Clairol Herbal Essence Moisturizing Lotion, Normal Skin  Curel Age Defying Therapeutic Moisturizing Lotion with Alpha Hydroxy  Curel Extreme Care Body Lotion  Curel Soothing Hands Moisturizing Hand Lotion  Curel Therapeutic Moisturizing Cream, Fragrance-Free  Curel Therapeutic Moisturizing Lotion, Fragrance-Free  Curel Therapeutic Moisturizing Lotion, Original Formula  Eucerin Daily Replenishing Lotion  Eucerin Dry Skin Therapy Plus Alpha Hydroxy Crme  Eucerin Dry Skin Therapy Plus Alpha Hydroxy Lotion  Eucerin Original Crme  Eucerin Original Lotion  Eucerin Plus Crme Eucerin Plus Lotion  Eucerin TriLipid Replenishing Lotion  Keri Anti-Bacterial Hand Lotion  Keri Deep Conditioning Original Lotion Dry Skin Formula Softly Scented  Keri Deep Conditioning Original Lotion, Fragrance Free Sensitive Skin Formula  Keri Lotion Fast Absorbing Fragrance Free Sensitive Skin Formula  Keri Lotion Fast Absorbing Softly Scented Dry Skin Formula  Keri Original Lotion  Keri Skin Renewal Lotion Keri Silky Smooth Lotion  Keri Silky Smooth Sensitive Skin Lotion  Nivea Body Creamy  Conditioning Oil  Nivea Body Extra Enriched Teacher, adult education Moisturizing Lotion Nivea Crme  Nivea Skin Firming Lotion  NutraDerm 30 Skin Lotion  NutraDerm Skin Lotion  NutraDerm Therapeutic Skin Cream  NutraDerm Therapeutic Skin Lotion  ProShield Protective Hand Cream  Provon moisturizing lotion  Please read over the following fact sheets that you were given.

## 2023-11-27 ENCOUNTER — Other Ambulatory Visit: Payer: Self-pay

## 2023-11-27 ENCOUNTER — Encounter (HOSPITAL_COMMUNITY)
Admission: RE | Admit: 2023-11-27 | Discharge: 2023-11-27 | Disposition: A | Discharge status: Home / Self Care | Source: Ambulatory Visit | Attending: Neurosurgery | Admitting: Neurosurgery

## 2023-11-27 ENCOUNTER — Encounter (HOSPITAL_COMMUNITY): Payer: Self-pay

## 2023-11-27 VITALS — BP 138/98 | HR 64 | Temp 98.1°F | Resp 19 | Ht 71.0 in | Wt 203.0 lb

## 2023-11-27 DIAGNOSIS — G4733 Obstructive sleep apnea (adult) (pediatric): Secondary | ICD-10-CM | POA: Insufficient documentation

## 2023-11-27 DIAGNOSIS — K5521 Angiodysplasia of colon with hemorrhage: Secondary | ICD-10-CM | POA: Insufficient documentation

## 2023-11-27 DIAGNOSIS — N182 Chronic kidney disease, stage 2 (mild): Secondary | ICD-10-CM | POA: Insufficient documentation

## 2023-11-27 DIAGNOSIS — Z79899 Other long term (current) drug therapy: Secondary | ICD-10-CM | POA: Diagnosis not present

## 2023-11-27 DIAGNOSIS — J4489 Other specified chronic obstructive pulmonary disease: Secondary | ICD-10-CM | POA: Diagnosis not present

## 2023-11-27 DIAGNOSIS — Z01818 Encounter for other preprocedural examination: Secondary | ICD-10-CM

## 2023-11-27 DIAGNOSIS — Z981 Arthrodesis status: Secondary | ICD-10-CM | POA: Diagnosis not present

## 2023-11-27 DIAGNOSIS — R0789 Other chest pain: Secondary | ICD-10-CM | POA: Diagnosis not present

## 2023-11-27 DIAGNOSIS — K219 Gastro-esophageal reflux disease without esophagitis: Secondary | ICD-10-CM | POA: Diagnosis not present

## 2023-11-27 DIAGNOSIS — Z01812 Encounter for preprocedural laboratory examination: Secondary | ICD-10-CM | POA: Insufficient documentation

## 2023-11-27 DIAGNOSIS — I4891 Unspecified atrial fibrillation: Secondary | ICD-10-CM | POA: Diagnosis not present

## 2023-11-27 DIAGNOSIS — I471 Supraventricular tachycardia, unspecified: Secondary | ICD-10-CM | POA: Diagnosis not present

## 2023-11-27 DIAGNOSIS — I10 Essential (primary) hypertension: Secondary | ICD-10-CM | POA: Diagnosis not present

## 2023-11-27 DIAGNOSIS — I447 Left bundle-branch block, unspecified: Secondary | ICD-10-CM | POA: Insufficient documentation

## 2023-11-27 DIAGNOSIS — Z7901 Long term (current) use of anticoagulants: Secondary | ICD-10-CM | POA: Insufficient documentation

## 2023-11-27 HISTORY — DX: Nontoxic single thyroid nodule: E04.1

## 2023-11-27 HISTORY — DX: Unspecified osteoarthritis, unspecified site: M19.90

## 2023-11-27 LAB — TYPE AND SCREEN
ABO/RH(D): O POS
Antibody Screen: NEGATIVE

## 2023-11-27 LAB — SURGICAL PCR SCREEN
MRSA, PCR: NEGATIVE
Staphylococcus aureus: NEGATIVE

## 2023-11-27 NOTE — Telephone Encounter (Signed)
 Dr Arne pt:  Calling to say they did not receive the fax for cardiac clearance- looks like it was signed and sent on 11/17/23  FAX# [phone] attn Dr Louis CB# [phone] ext [phone]

## 2023-11-27 NOTE — Progress Notes (Signed)
 PCP - Dr. Darice Henle - requested LOV Cardiologist - Dr. Erna Creighton - Pulm: Dr. Prentice Graven - LOV 11/14/23  PPM/ICD -  denies   Chest x-ray - 11/19/23 EKG - 11/19/23 Stress Test -  09/08/22 ECHO - 08/21/23 Cardiac Cath - 08/23/20  Sleep Study -  OSA+ but does not always wear CPAP  NO DM  Last dose of GLP1 agonist-   n/a GLP1 instructions:  n/a  Blood Thinner Instructions:  last dose of Eliquis  was 8/19 - per patient Aspirin  Instructions: n/a  ERAS Protcol - NPO   COVID TEST- n/a   Anesthesia review:  yes -   Patient denies shortness of breath, fever, cough and chest pain at PAT appointment   All instructions explained to the patient, with a verbal understanding of the material. Patient agrees to go over the instructions while at home for a better understanding. Patient also instructed to self quarantine after being tested for COVID-19. The opportunity to ask questions was provided.

## 2023-11-28 NOTE — Progress Notes (Signed)
 Anesthesia Chart Review:  63 year old male follows with cardiology for history of HTN, A-fib s/p multiple ablations maintained on sotalol  on Eliquis , left bundle branch block, SVT, chronic noncardiac chest pain.  Patient has had numerous benign evaluations of chest pain. He has had negative pharmacologic stress test 06/2016, repeat negative pharmacologic stress test 06/2019, coronary CTA 02/2020 with minimal nonobstructive CAD, left heart cath 08/23/2020 with patent coronaries, sluggish flow possibly due to microvascular disease, negative pharmacologic stress test 09/2022.  He was last seen in cardiology follow-up right atrium on 07/15/2023.  Noted to be stable at that time but other than 2 recent episodes of epistaxis requiring nasal packing, no changes to management, recommended follow-up in 7 to 8 months.  Echo 08/21/2023 showed LVEF 55 to 60%, normal RV, mild mitral regurgitation, mild tricuspid regurgitation, mildly dilated ascending aorta 4.0 cm.  Patient was seen in the ED on 11/19/2023 with complaint of left-sided chest pain, sharp, worse with taking a deep breath.  Evaluation was benign, he was discharged for follow-up with PCP.  Patient states he was subsequently seen by PCP and started on colchicine for possible pleuritis and his pain resolved.  Note also recommended he proceed with lumbar spine surgery as planned on 12/02/2023.  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  Pt reports LD Eliquis  8/19.  BMP and CBC 11/19/2023 reviewed, WNL.  EKG 11/19/2023: Sinus rhythm.  Rate 66. Prolonged PR interval. Left bundle branch block. No significant change since last tracing  TTE 08/21/2023 (Care Everywhere): SUMMARY  The left ventricular size is normal. There is concentric left ventricular remodelling with normal  wall motion, normal systolic function and ejection fraction  55-60% .   Reduced LV Global L Strain =-14.1%.  The left ventricular diastolic function is probably normal for age, with likely normal left atrial  pressure.  The right ventricle is normal in size and function.  The atria are normal in size.  There is mild mitral regurgitation.  There is mild tricuspid regurgitation.  No pulmonary hypertension. Estimated right ventricular systolic pressure is 26 mmHg.  IVC size was normal.  Mildly dilated ascending aorta 4.0 cm, unchanged.  There is no significant change in comparison with the last study.   Zio (07/27/2022) HR of 50 bpm, max HR of 176 bpm, and avg HR of 70 bpm.  Predominant underlying rhythm was Sinus Rhythm. Bundle Branch Block/IVCD was present.  Intermittent Bundle Branch Block was present.  4 VTruns occurred, the run with the fastest interval lasting 5 beats with a max rate of 164 bpm, the longest lasting 6 beats with an avg rate of 112 bpm.  29 SVT runs occurred, the run with the fastest interval lasting 12.1 secs with a max rate of 176 bpm, the longest lasting 55.4 secs with an avg rate of 143 bpm.  Idioventricular Rhythm was present. Supraventricular Tachycardia was detected within +/- 45 seconds of symptomatic patient event(s).  PAC (<1.0%),  PVCs(<1.0%),  Agree with Findings. Very frequent triggered events for sinus rhythm isolated PACs and PVCs.  SVT was present some associated with symptoms.  Event listed as VT is more appropriately consecutive PVCs   Echo (09/09/2022) SUMMARY  The left ventricular size is normal. There is concentric left ventricular remodelling with normal wall motion, normal systolic function and ejection fraction 65-70% .  Left atrial pressure is indeterminate due to E-e > 8 and <  14  The right ventricle is normal in size and function.  The atria are normal in size.  There is no significant valvular stenosis or regurgitation.  There was insufficient TR detected to calculate RV systolic pressure.  The inferior vena  cava was not visualized during the exam.  The aortic sinus is normal size.  Borderline dilated ascending aorta 4.0 cm.   Echo (09/05/2020) SUMMARY  The left ventricular size is normal.  Mild left ventricular hypertrophy  Left ventricular systolic function is normal.  The left atrial size is normal.  There is aortic valve sclerosis.  There is no pericardial effusion.  The right ventricle is normal in size and function.   LHC (08/23/2020) Findings  --LM angiographically normal  --LAD minimal luminal irregularities w- TIMI II flow  --LCx angiographically normal  --RI small vessel that is angiographically normal  --RCA inferior take-off that required MP catheter to engage; minimal  luminal irregularities of PDA with TIMI II flow  --LVEDP AV not crossed   Comments widely patent coronaries; sluggish flow may indicated microvasculature disase      Lynwood Geofm RIGGERS [facility] Short Stay Center/Anesthesiology Phone [phone] 11/28/2023 2:38 PM

## 2023-11-28 NOTE — Anesthesia Preprocedure Evaluation (Signed)
 Anesthesia Evaluation    Airway        Dental   Pulmonary           Cardiovascular hypertension,      Neuro/Psych    GI/Hepatic   Endo/Other    Renal/GU      Musculoskeletal   Abdominal   Peds  Hematology   Anesthesia Other Findings   Reproductive/Obstetrics                              Anesthesia Physical Anesthesia Plan  ASA:   Anesthesia Plan:    Post-op Pain Management:    Induction:   PONV Risk Score and Plan:   Airway Management Planned:   Additional Equipment:   Intra-op Plan:   Post-operative Plan:   Informed Consent:   Plan Discussed with:   Anesthesia Plan Comments: (PAT note by Lynwood Hope, PA-C: 63 year old male follows with cardiology for history of HTN, A-fib s/p multiple ablations maintained on sotalol  on Eliquis , left bundle branch block, SVT, chronic noncardiac chest pain.  Patient has had numerous benign evaluations of chest pain. He has had negative pharmacologic stress test 06/2016, repeat negative pharmacologic stress test 06/2019, coronary CTA 02/2020 with minimal nonobstructive CAD, left heart cath 08/23/2020 with patent coronaries, sluggish flow possibly due to microvascular disease, negative pharmacologic stress test 09/2022.  He was last seen in cardiology follow-up right atrium on 07/15/2023.  Noted to be stable at that time but other than 2 recent episodes of epistaxis requiring nasal packing, no changes to management, recommended follow-up in 7 to 8 months.  Echo 08/21/2023 showed LVEF 55 to 60%, normal RV, mild mitral regurgitation, mild tricuspid regurgitation, mildly dilated ascending aorta 4.0 cm.  Patient was seen in the ED on 11/19/2023 with complaint of left-sided chest pain, sharp, worse with taking a deep breath.  Evaluation was benign, he was discharged for follow-up with PCP.  Patient states he was subsequently seen by PCP and started on colchicine for  possible pleuritis and his pain resolved.  Note also recommended he proceed with lumbar spine surgery as planned on 12/02/2023.  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  Pt reports LD Eliquis  8/19.  BMP and CBC 11/19/2023 reviewed, WNL.  EKG 11/19/2023: Sinus rhythm.  Rate 66. Prolonged PR interval. Left bundle branch block. No significant change since last tracing  TTE 08/21/2023 (Care Everywhere): SUMMARY  The left ventricular size is normal. There is concentric left ventricular remodelling with normal  wall motion, normal systolic function and ejection fraction  55-60% .  Reduced LV Global L Strain =-14.1%.  The left ventricular diastolic function is probably normal for age, with likely normal left atrial  pressure.  The right ventricle is normal in size and function.  The atria are normal in size.  There is mild mitral regurgitation.  There is mild tricuspid regurgitation.  No pulmonary hypertension. Estimated right ventricular systolic pressure is 26 mmHg.  IVC size was normal.  Mildly dilated ascending aorta 4.0 cm, unchanged.  There is no significant change in comparison with the last study.   Zio (07/27/2022) HR of 50 bpm, max HR of 176 bpm, and avg HR of 70 bpm.  Predominant underlying rhythm was Sinus Rhythm. Bundle Branch Block/IVCD was present.  Intermittent Bundle Branch Block was present.  4 VTruns occurred, the  run with the fastest interval lasting 5 beats with a max rate of 164 bpm, the longest lasting 6 beats with an avg rate of 112 bpm.  29 SVT runs occurred, the run with the fastest interval lasting 12.1 secs with a max rate of 176 bpm, the longest lasting 55.4 secs with an avg rate of 143 bpm.  Idioventricular Rhythm was present. Supraventricular Tachycardia was detected within +/- 45 seconds of symptomatic patient event(s).  PAC (<1.0%),   PVCs(<1.0%),  Agree with Findings. Very frequent triggered events for sinus rhythm isolated PACs and PVCs.  SVT was present some associated with symptoms.  Event listed as VT is more appropriately consecutive PVCs   Echo (09/09/2022) SUMMARY  The left ventricular size is normal. There is concentric left ventricular remodelling with normal wall motion, normal systolic function and ejection fraction 65-70% .  Left atrial pressure is indeterminate due to E-e > 8 and < 14  The right ventricle is normal in size and function.  The atria are normal in size.  There is no significant valvular stenosis or regurgitation.  There was insufficient TR detected to calculate RV systolic pressure.  The inferior vena cava was not visualized during the exam.  The aortic sinus is normal size.  Borderline dilated ascending aorta 4.0 cm.   Echo (09/05/2020) SUMMARY  The left ventricular size is normal.  Mild left ventricular hypertrophy  Left ventricular systolic function is normal.  The left atrial size is normal.  There is aortic valve sclerosis.  There is no pericardial effusion.  The right ventricle is normal in size and function.   LHC (08/23/2020) Findings  --LM angiographically normal  --LAD minimal luminal irregularities w- TIMI II flow  --LCx angiographically normal  --RI small vessel that is angiographically normal  --RCA inferior take-off that required MP catheter to engage; minimal  luminal irregularities of PDA with TIMI II flow  --LVEDP AV not crossed   Comments widely patent coronaries; sluggish flow may indicated microvasculature disase   )         Anesthesia Quick Evaluation

## 2023-12-02 ENCOUNTER — Encounter (HOSPITAL_COMMUNITY): Payer: Self-pay | Admitting: Physician Assistant

## 2023-12-02 ENCOUNTER — Encounter (HOSPITAL_COMMUNITY): Admission: RE | Payer: Self-pay | Source: Home / Self Care

## 2023-12-02 ENCOUNTER — Ambulatory Visit (HOSPITAL_COMMUNITY): Admission: RE | Admit: 2023-12-02 | Source: Home / Self Care | Admitting: Neurosurgery

## 2023-12-02 SURGERY — POSTERIOR LUMBAR FUSION 1 LEVEL
Anesthesia: General | Site: Back

## 2023-12-16 ENCOUNTER — Other Ambulatory Visit: Payer: Self-pay | Admitting: Neurosurgery

## 2023-12-29 NOTE — Progress Notes (Signed)
 Surgical Instructions   Your procedure is scheduled on Friday, September 26th, 2025. Report to [facility] Main Entrance A at 6:00 A.M., then check in with the Admitting office. Any questions or running late day of surgery: call [phone]  Questions prior to your surgery date: call [phone], Monday-Friday, 8am-4pm. If you experience any cold or flu symptoms such as cough, fever, chills, shortness of breath, etc. between now and your scheduled surgery, please notify us  at the above number.     Remember:  Do not eat or drink after midnight the night before your surgery    Take these medicines the morning of surgery with A SIP OF WATER: Amlodipine  (Norvasc ) Atorvastatin  (Lipitor) Pantoprazole  (Protonix ) Sotalol  (Betapace ) Tamsulosin  (Flomax )    May take these medicines IF NEEDED: Acetaminophen  (Tylenol ) Albuterol  (Ventolin  HFA) - bring with you on the day of surgery Cetirizine (Zyrtec) Colchicine Cyclobenzaprine  (Flexeril ) Fluticasone  (Flonase ) Fluticasone -Salmeterol (Advair) Hydrocodone -acetaminophen  (Norco/Vicodin) Linaclotide  (Linzess ) Nitroglycerin  - please call [phone] to let a nurse know that you needed to take this medication Sumatriptan  (Imitrex ) Tramadol  (Ultram )   Follow your surgeon's instructions on when to stop apixaban  (Eliquis ).  If no instructions were given by your surgeon then you will need to call the office to get those instructions.     One week prior to surgery, STOP taking any Aspirin  (unless otherwise instructed by your surgeon) Aleve , Naproxen , Ibuprofen, Motrin, Advil, Goody's, BC's, all herbal medications, fish oil, and non-prescription vitamins.  This includes Diclofenac  (Voltaren ).                      Do NOT Smoke (Tobacco/Vaping) for 24 hours prior to your procedure.  If you use a CPAP at night, you may bring your mask/headgear for your overnight stay.   You will be asked to remove any contacts, glasses, piercing's, hearing  aid's, dentures/partials prior to surgery. Please bring cases for these items if needed.    Patients discharged the day of surgery will not be allowed to drive home, and someone needs to stay with them for 24 hours.  SURGICAL WAITING ROOM VISITATION Patients may have no more than 2 support people in the waiting area - these visitors may rotate.   Pre-op nurse will coordinate an appropriate time for 1 ADULT support person, who may not rotate, to accompany patient in pre-op.  Children under the age of 74 must have an adult with them who is not the patient and must remain in the main waiting area with an adult.  If the patient needs to stay at the hospital during part of their recovery, the visitor guidelines for inpatient rooms apply.  Please refer to the [facility] website for the visitor guidelines for any additional information.   If you received a COVID test during your pre-op visit  it is requested that you wear a mask when out in public, stay away from anyone that may not be feeling well and notify your surgeon if you develop symptoms. If you have been in contact with anyone that has tested positive in the last 10 days please notify you surgeon.      Pre-operative 5 CHG Bathing Instructions   You can play a key role in reducing the risk of infection after surgery. Your skin needs to be as free of germs as possible. You can reduce the number of germs on your skin by washing with CHG (chlorhexidine  gluconate) soap before surgery. CHG is an antiseptic soap that kills germs and continues to kill  germs even after washing.   DO NOT use if you have an allergy to chlorhexidine /CHG or antibacterial soaps. If your skin becomes reddened or irritated, stop using the CHG and notify one of our RNs at [phone].   Please shower with the CHG soap starting 4 days before surgery using the following schedule:     Please keep in mind the following:  DO NOT shave, including legs and underarms,  starting the day of your first shower.   You may shave your face at any point before/day of surgery.  Place clean sheets on your bed the day you start using CHG soap. Use a clean washcloth (not used since being washed) for each shower. DO NOT sleep with pets once you start using the CHG.   CHG Shower Instructions:  Wash your face and private area with normal soap. If you choose to wash your hair, wash first with your normal shampoo.  After you use shampoo/soap, rinse your hair and body thoroughly to remove shampoo/soap residue.  Turn the water OFF and apply about 3 tablespoons (45 ml) of CHG soap to a CLEAN washcloth.  Apply CHG soap ONLY FROM YOUR NECK DOWN TO YOUR TOES (washing for 3-5 minutes)  DO NOT use CHG soap on face, private areas, open wounds, or sores.  Pay special attention to the area where your surgery is being performed.  If you are having back surgery, having someone wash your back for you may be helpful. Wait 2 minutes after CHG soap is applied, then you may rinse off the CHG soap.  Pat dry with a clean towel  Put on clean clothes/pajamas   If you choose to wear lotion, please use ONLY the CHG-compatible lotions that are listed below.  Additional instructions for the day of surgery: DO NOT APPLY any lotions, deodorants, cologne, or perfumes.   Do not bring valuables to the hospital. [facility] is not responsible for any belongings/valuables. Do not wear nail polish, gel polish, artificial nails, or any other type of covering on natural nails (fingers and toes) Do not wear jewelry or makeup Put on clean/comfortable clothes.  Please brush your teeth.  Ask your nurse before applying any prescription medications to the skin.     CHG Compatible Lotions   Aveeno Moisturizing lotion  Cetaphil Moisturizing Cream  Cetaphil Moisturizing Lotion  Clairol Herbal Essence Moisturizing Lotion, Dry Skin  Clairol Herbal Essence Moisturizing Lotion, Extra Dry Skin  Clairol Herbal  Essence Moisturizing Lotion, Normal Skin  Curel Age Defying Therapeutic Moisturizing Lotion with Alpha Hydroxy  Curel Extreme Care Body Lotion  Curel Soothing Hands Moisturizing Hand Lotion  Curel Therapeutic Moisturizing Cream, Fragrance-Free  Curel Therapeutic Moisturizing Lotion, Fragrance-Free  Curel Therapeutic Moisturizing Lotion, Original Formula  Eucerin Daily Replenishing Lotion  Eucerin Dry Skin Therapy Plus Alpha Hydroxy Crme  Eucerin Dry Skin Therapy Plus Alpha Hydroxy Lotion  Eucerin Original Crme  Eucerin Original Lotion  Eucerin Plus Crme Eucerin Plus Lotion  Eucerin TriLipid Replenishing Lotion  Keri Anti-Bacterial Hand Lotion  Keri Deep Conditioning Original Lotion Dry Skin Formula Softly Scented  Keri Deep Conditioning Original Lotion, Fragrance Free Sensitive Skin Formula  Keri Lotion Fast Absorbing Fragrance Free Sensitive Skin Formula  Keri Lotion Fast Absorbing Softly Scented Dry Skin Formula  Keri Original Lotion  Keri Skin Renewal Lotion Keri Silky Smooth Lotion  Keri Silky Smooth Sensitive Skin Lotion  Nivea Body Creamy Conditioning Oil  Nivea Body Extra Enriched Teacher, adult education  Moisturizing Lotion Nivea Crme  Nivea Skin Firming Lotion  NutraDerm 30 Skin Lotion  NutraDerm Skin Lotion  NutraDerm Therapeutic Skin Cream  NutraDerm Therapeutic Skin Lotion  ProShield Protective Hand Cream  Provon moisturizing lotion  Please read over the following fact sheets that you were given.

## 2023-12-30 ENCOUNTER — Encounter (HOSPITAL_COMMUNITY): Payer: Self-pay

## 2023-12-30 ENCOUNTER — Encounter (HOSPITAL_COMMUNITY)
Admission: RE | Admit: 2023-12-30 | Discharge: 2023-12-30 | Disposition: A | Discharge status: Home / Self Care | Source: Ambulatory Visit | Attending: Neurosurgery | Admitting: Neurosurgery

## 2023-12-30 ENCOUNTER — Other Ambulatory Visit: Payer: Self-pay

## 2023-12-30 VITALS — BP 143/92 | HR 63 | Temp 97.8°F | Resp 17 | Ht 71.0 in | Wt 202.0 lb

## 2023-12-30 DIAGNOSIS — Z01818 Encounter for other preprocedural examination: Secondary | ICD-10-CM

## 2023-12-30 DIAGNOSIS — I2510 Atherosclerotic heart disease of native coronary artery without angina pectoris: Secondary | ICD-10-CM | POA: Insufficient documentation

## 2023-12-30 DIAGNOSIS — Z01812 Encounter for preprocedural laboratory examination: Secondary | ICD-10-CM | POA: Diagnosis present

## 2023-12-30 HISTORY — DX: Headache, unspecified: R51.9

## 2023-12-30 LAB — TYPE AND SCREEN
ABO/RH(D): O POS
Antibody Screen: NEGATIVE

## 2023-12-30 LAB — CBC
HCT: 40.3 % (ref 39.0–52.0)
Hemoglobin: 13.2 g/dL (ref 13.0–17.0)
MCH: 28.4 pg (ref 26.0–34.0)
MCHC: 32.8 g/dL (ref 30.0–36.0)
MCV: 86.7 fL (ref 80.0–100.0)
Platelets: 220 K/uL (ref 150–400)
RBC: 4.65 MIL/uL (ref 4.22–5.81)
RDW: 12.6 % (ref 11.5–15.5)
WBC: 6.5 K/uL (ref 4.0–10.5)
nRBC: 0 % (ref 0.0–0.2)

## 2023-12-30 LAB — BASIC METABOLIC PANEL WITH GFR
Anion gap: 9 (ref 5–15)
BUN: 9 mg/dL (ref 8–23)
CO2: 24 mmol/L (ref 22–32)
Calcium: 9.5 mg/dL (ref 8.9–10.3)
Chloride: 107 mmol/L (ref 98–111)
Creatinine, Ser: 1.13 mg/dL (ref 0.61–1.24)
GFR, Estimated: >60 mL/min (ref >60)
Glucose, Bld: 94 mg/dL (ref 70–99)
Potassium: 4.2 mmol/L (ref 3.5–5.1)
Sodium: 140 mmol/L (ref 135–145)

## 2023-12-30 LAB — SURGICAL PCR SCREEN

## 2023-12-30 NOTE — Progress Notes (Signed)
 PCP - Dr. Darice Henle Cardiologist - Dr. Erna Creighton - last office visit 07/15/2023 Pulmonologist - Dr. Prentice Craft - last office visit 11/14/2023  PPM/ICD - Denies Device Orders - n/a Rep Notified - n/a  Chest x-ray - n/a EKG - 11/18/2023 Stress Test - 09/08/2022 ECHO - 08/21/2023 Cardiac Cath - 08/23/2020  Sleep Study - +OSA, but pt does not wear his CPAP nightly  No DM  Last dose of GLP1 agonist- n/a GLP1 instructions: n/a  Blood Thinner Instructions: Pts last dose of Eliquis  ws Sunday morning, 9/21 Aspirin  Instructions: n/a  NPO after midnight  COVID TEST- n/a   Anesthesia review: Yes. Cardiac clearance. Previously reviewed by Lynwood Hope, PA-C for surgery on 8/26, but pt tested positive for COVID the day before surgery and had to postpone. Pt states he has been asymptomatic for about two and a half weeks, which is when he returned to work.    Patient denies shortness of breath, fever, cough and chest pain at PAT appointment. Other than COVID, pt denies any other respiratory illness/infection in the last two months.   All instructions explained to the patient, with a verbal understanding of the material. Patient agrees to go over the instructions while at home for a better understanding. Patient also instructed to self quarantine after being tested for COVID-19. The opportunity to ask questions was provided.

## 2023-12-31 NOTE — Progress Notes (Signed)
 Surgical PCR collected in PAT result invalid. Will need to recollect DOS. Order placed.

## 2024-01-02 ENCOUNTER — Ambulatory Visit (HOSPITAL_BASED_OUTPATIENT_CLINIC_OR_DEPARTMENT_OTHER)

## 2024-01-02 ENCOUNTER — Other Ambulatory Visit: Payer: Self-pay

## 2024-01-02 ENCOUNTER — Observation Stay (HOSPITAL_COMMUNITY)
Admission: RE | Admit: 2024-01-02 | Discharge: 2024-01-03 | Disposition: A | Discharge status: Home / Self Care | Attending: Neurosurgery | Admitting: Neurosurgery

## 2024-01-02 ENCOUNTER — Encounter (HOSPITAL_COMMUNITY): Payer: Self-pay | Admitting: Neurosurgery

## 2024-01-02 ENCOUNTER — Ambulatory Visit (HOSPITAL_COMMUNITY): Admitting: Physician Assistant

## 2024-01-02 ENCOUNTER — Encounter (HOSPITAL_COMMUNITY)
Admission: RE | Disposition: A | Discharge status: Home / Self Care | Payer: Self-pay | Source: Home / Self Care | Attending: Neurosurgery

## 2024-01-02 ENCOUNTER — Ambulatory Visit (HOSPITAL_COMMUNITY)

## 2024-01-02 DIAGNOSIS — Z79899 Other long term (current) drug therapy: Secondary | ICD-10-CM | POA: Insufficient documentation

## 2024-01-02 DIAGNOSIS — M48061 Spinal stenosis, lumbar region without neurogenic claudication: Secondary | ICD-10-CM | POA: Insufficient documentation

## 2024-01-02 DIAGNOSIS — I129 Hypertensive chronic kidney disease with stage 1 through stage 4 chronic kidney disease, or unspecified chronic kidney disease: Secondary | ICD-10-CM

## 2024-01-02 DIAGNOSIS — N1831 Chronic kidney disease, stage 3a: Secondary | ICD-10-CM | POA: Diagnosis not present

## 2024-01-02 DIAGNOSIS — M4316 Spondylolisthesis, lumbar region: Secondary | ICD-10-CM | POA: Diagnosis present

## 2024-01-02 DIAGNOSIS — J449 Chronic obstructive pulmonary disease, unspecified: Secondary | ICD-10-CM | POA: Diagnosis not present

## 2024-01-02 DIAGNOSIS — I10 Essential (primary) hypertension: Secondary | ICD-10-CM | POA: Diagnosis not present

## 2024-01-02 DIAGNOSIS — M4310 Spondylolisthesis, site unspecified: Secondary | ICD-10-CM | POA: Diagnosis present

## 2024-01-02 DIAGNOSIS — Z01818 Encounter for other preprocedural examination: Principal | ICD-10-CM

## 2024-01-02 DIAGNOSIS — I4891 Unspecified atrial fibrillation: Secondary | ICD-10-CM | POA: Diagnosis not present

## 2024-01-02 DIAGNOSIS — Z7901 Long term (current) use of anticoagulants: Secondary | ICD-10-CM | POA: Diagnosis not present

## 2024-01-02 DIAGNOSIS — I480 Paroxysmal atrial fibrillation: Secondary | ICD-10-CM | POA: Diagnosis not present

## 2024-01-02 LAB — SURGICAL PCR SCREEN
MRSA, PCR: NEGATIVE
Staphylococcus aureus: NEGATIVE

## 2024-01-02 SURGERY — POSTERIOR LUMBAR FUSION 1 LEVEL
Anesthesia: General | Site: Back

## 2024-01-02 MED ORDER — ROCURONIUM BROMIDE 10 MG/ML (PF) SYRINGE
PREFILLED_SYRINGE | INTRAVENOUS | Status: AC
  Filled 2024-01-02: qty 10

## 2024-01-02 MED ORDER — BISACODYL 10 MG RE SUPP: 10.0000 mg | Freq: Every day | RECTAL | Status: DC | PRN

## 2024-01-02 MED ORDER — VANCOMYCIN HCL 1000 MG IV SOLR
INTRAVENOUS | Status: DC | PRN
  Administered 2024-01-02: 1000 mg

## 2024-01-02 MED ORDER — SODIUM CHLORIDE 0.9% FLUSH
3.0000 mL | Freq: Two times a day (BID) | INTRAVENOUS | Status: DC
  Administered 2024-01-02 (×2): 3 mL via INTRAVENOUS

## 2024-01-02 MED ORDER — SODIUM CHLORIDE 0.9% FLUSH: 3.0000 mL | INTRAVENOUS | Status: DC | PRN

## 2024-01-02 MED ORDER — FLUTICASONE FUROATE-VILANTEROL 200-25 MCG/ACT IN AEPB
1.0000 | INHALATION_SPRAY | Freq: Every day | RESPIRATORY_TRACT | Status: DC
  Filled 2024-01-02: qty 28

## 2024-01-02 MED ORDER — ACETAMINOPHEN 650 MG RE SUPP: 650.0000 mg | RECTAL | Status: DC | PRN

## 2024-01-02 MED ORDER — THROMBIN 20000 UNITS EX SOLR
CUTANEOUS | Status: AC
  Filled 2024-01-02: qty 20000

## 2024-01-02 MED ORDER — ALBUTEROL SULFATE (2.5 MG/3ML) 0.083% IN NEBU: 2.5000 mg | INHALATION_SOLUTION | RESPIRATORY_TRACT | Status: DC | PRN

## 2024-01-02 MED ORDER — CHLORHEXIDINE GLUCONATE 0.12 % MT SOLN
15.0000 mL | Freq: Once | OROMUCOSAL | Status: AC
  Administered 2024-01-02: 15 mL via OROMUCOSAL
  Filled 2024-01-02: qty 15

## 2024-01-02 MED ORDER — PHENYLEPHRINE 80 MCG/ML (10ML) SYRINGE FOR IV PUSH (FOR BLOOD PRESSURE SUPPORT)
PREFILLED_SYRINGE | INTRAVENOUS | Status: DC | PRN
  Administered 2024-01-02: 240 ug via INTRAVENOUS

## 2024-01-02 MED ORDER — FENTANYL CITRATE (PF) 100 MCG/2ML IJ SOLN
25.0000 ug | INTRAMUSCULAR | Status: DC | PRN
  Administered 2024-01-02: 50 ug via INTRAVENOUS
  Administered 2024-01-02: 25 ug via INTRAVENOUS

## 2024-01-02 MED ORDER — SUMATRIPTAN SUCCINATE 100 MG PO TABS
100.0000 mg | ORAL_TABLET | ORAL | Status: DC
Start: 2024-01-02 — End: 2024-01-03

## 2024-01-02 MED ORDER — POLYETHYLENE GLYCOL 3350 17 G PO PACK: 17.0000 g | PACK | Freq: Every day | ORAL | Status: DC | PRN

## 2024-01-02 MED ORDER — DEXAMETHASONE 4 MG PO TABS
4.0000 mg | ORAL_TABLET | Freq: Four times a day (QID) | ORAL | Status: DC
  Administered 2024-01-02 – 2024-01-03 (×3): 4 mg via ORAL
  Filled 2024-01-02 (×3): qty 1

## 2024-01-02 MED ORDER — ROCURONIUM BROMIDE 10 MG/ML (PF) SYRINGE
PREFILLED_SYRINGE | INTRAVENOUS | Status: DC | PRN
Start: 2024-01-02 — End: 2024-01-02
  Administered 2024-01-02: 10 mg via INTRAVENOUS
  Administered 2024-01-02: 60 mg via INTRAVENOUS
  Administered 2024-01-02: 10 mg via INTRAVENOUS
  Administered 2024-01-02: 20 mg via INTRAVENOUS

## 2024-01-02 MED ORDER — MENTHOL 3 MG MT LOZG: 1.0000 | LOZENGE | OROMUCOSAL | Status: DC | PRN

## 2024-01-02 MED ORDER — FENTANYL CITRATE (PF) 100 MCG/2ML IJ SOLN
INTRAMUSCULAR | Status: AC
  Filled 2024-01-02: qty 2

## 2024-01-02 MED ORDER — CHLORHEXIDINE GLUCONATE CLOTH 2 % EX PADS: 6.0000 | MEDICATED_PAD | Freq: Once | CUTANEOUS | Status: DC

## 2024-01-02 MED ORDER — DEXAMETHASONE SODIUM PHOSPHATE 10 MG/ML IJ SOLN
INTRAMUSCULAR | Status: AC
  Filled 2024-01-02: qty 1

## 2024-01-02 MED ORDER — ACETAMINOPHEN 10 MG/ML IV SOLN
1000.0000 mg | Freq: Once | INTRAVENOUS | Status: DC | PRN
  Administered 2024-01-02: 1000 mg via INTRAVENOUS

## 2024-01-02 MED ORDER — LIDOCAINE 2% (20 MG/ML) 5 ML SYRINGE
INTRAMUSCULAR | Status: AC
Start: 2024-01-02 — End: 2024-01-02
  Filled 2024-01-02: qty 5

## 2024-01-02 MED ORDER — MIDAZOLAM HCL 2 MG/2ML IJ SOLN
INTRAMUSCULAR | Status: DC | PRN
  Administered 2024-01-02: 2 mg via INTRAVENOUS

## 2024-01-02 MED ORDER — OXYCODONE HCL 5 MG PO TABS
5.0000 mg | ORAL_TABLET | Freq: Once | ORAL | Status: AC | PRN
  Administered 2024-01-02: 5 mg via ORAL

## 2024-01-02 MED ORDER — BUPIVACAINE HCL (PF) 0.25 % IJ SOLN
INTRAMUSCULAR | Status: AC
  Filled 2024-01-02: qty 30

## 2024-01-02 MED ORDER — ONDANSETRON HCL 4 MG/2ML IJ SOLN
INTRAMUSCULAR | Status: AC
  Filled 2024-01-02: qty 2

## 2024-01-02 MED ORDER — ONDANSETRON HCL 4 MG/2ML IJ SOLN: 4.0000 mg | Freq: Once | INTRAMUSCULAR | Status: DC | PRN

## 2024-01-02 MED ORDER — DROPERIDOL 2.5 MG/ML IJ SOLN: 0.6250 mg | Freq: Once | INTRAMUSCULAR | Status: DC | PRN

## 2024-01-02 MED ORDER — PROPOFOL 10 MG/ML IV BOLUS
INTRAVENOUS | Status: AC
  Filled 2024-01-02: qty 20

## 2024-01-02 MED ORDER — CEFAZOLIN SODIUM-DEXTROSE 1-4 GM/50ML-% IV SOLN
1.0000 g | Freq: Three times a day (TID) | INTRAVENOUS | Status: AC
  Administered 2024-01-02 (×2): 1 g via INTRAVENOUS
  Filled 2024-01-02 (×2): qty 50

## 2024-01-02 MED ORDER — ATORVASTATIN CALCIUM 10 MG PO TABS
10.0000 mg | ORAL_TABLET | Freq: Every day | ORAL | Status: DC
Start: 2024-01-02 — End: 2024-01-03
  Administered 2024-01-02: 10 mg via ORAL
  Filled 2024-01-02 (×2): qty 1

## 2024-01-02 MED ORDER — ACETAMINOPHEN 10 MG/ML IV SOLN
INTRAVENOUS | Status: AC
  Filled 2024-01-02: qty 100

## 2024-01-02 MED ORDER — ADULT MULTIVITAMIN W/MINERALS CH
1.0000 | ORAL_TABLET | Freq: Every day | ORAL | Status: DC
  Administered 2024-01-02: 1 via ORAL
  Filled 2024-01-02 (×2): qty 1

## 2024-01-02 MED ORDER — GUAIFENESIN ER 600 MG PO TB12: 600.0000 mg | ORAL_TABLET | Freq: Every day | ORAL | Status: DC | PRN

## 2024-01-02 MED ORDER — OXYCODONE HCL 5 MG/5ML PO SOLN: 5.0000 mg | Freq: Once | ORAL | Status: AC | PRN

## 2024-01-02 MED ORDER — FLUTICASONE PROPIONATE 50 MCG/ACT NA SUSP: 1.0000 | Freq: Every day | NASAL | Status: DC | PRN

## 2024-01-02 MED ORDER — ONDANSETRON HCL 4 MG/2ML IJ SOLN
INTRAMUSCULAR | Status: DC | PRN
  Administered 2024-01-02: 4 mg via INTRAVENOUS

## 2024-01-02 MED ORDER — OXYCODONE HCL 5 MG PO TABS
ORAL_TABLET | ORAL | Status: AC
  Filled 2024-01-02: qty 1

## 2024-01-02 MED ORDER — LIDOCAINE 2% (20 MG/ML) 5 ML SYRINGE
INTRAMUSCULAR | Status: DC | PRN
  Administered 2024-01-02: 100 mg via INTRAVENOUS

## 2024-01-02 MED ORDER — SODIUM CHLORIDE 0.9 % IV SOLN
250.0000 mL | INTRAVENOUS | Status: DC
Start: 2024-01-02 — End: 2024-01-03
  Administered 2024-01-02: 250 mL via INTRAVENOUS

## 2024-01-02 MED ORDER — THROMBIN 20000 UNITS EX SOLR
CUTANEOUS | Status: DC | PRN
  Administered 2024-01-02: 20 mL via TOPICAL

## 2024-01-02 MED ORDER — ORAL CARE MOUTH RINSE: 15.0000 mL | Freq: Once | OROMUCOSAL | Status: AC

## 2024-01-02 MED ORDER — ALBUTEROL SULFATE HFA 108 (90 BASE) MCG/ACT IN AERS
INHALATION_SPRAY | RESPIRATORY_TRACT | Status: DC | PRN
  Administered 2024-01-02: 2 via RESPIRATORY_TRACT

## 2024-01-02 MED ORDER — KETAMINE HCL 50 MG/5ML IJ SOSY
PREFILLED_SYRINGE | INTRAMUSCULAR | Status: AC
  Filled 2024-01-02: qty 5

## 2024-01-02 MED ORDER — MIDAZOLAM HCL 2 MG/2ML IJ SOLN
INTRAMUSCULAR | Status: AC
  Filled 2024-01-02: qty 2

## 2024-01-02 MED ORDER — OXYCODONE HCL 5 MG PO TABS
10.0000 mg | ORAL_TABLET | ORAL | Status: DC | PRN
  Administered 2024-01-02 – 2024-01-03 (×5): 10 mg via ORAL
  Filled 2024-01-02 (×5): qty 2

## 2024-01-02 MED ORDER — LINACLOTIDE 145 MCG PO CAPS: 145.0000 ug | ORAL_CAPSULE | Freq: Every day | ORAL | Status: DC | PRN

## 2024-01-02 MED ORDER — METHOCARBAMOL 500 MG PO TABS
500.0000 mg | ORAL_TABLET | Freq: Four times a day (QID) | ORAL | Status: DC | PRN
  Administered 2024-01-02 – 2024-01-03 (×2): 500 mg via ORAL
  Filled 2024-01-02 (×2): qty 1

## 2024-01-02 MED ORDER — FENTANYL CITRATE (PF) 250 MCG/5ML IJ SOLN
INTRAMUSCULAR | Status: AC
  Filled 2024-01-02: qty 5

## 2024-01-02 MED ORDER — SOTALOL HCL 80 MG PO TABS
80.0000 mg | ORAL_TABLET | Freq: Two times a day (BID) | ORAL | Status: DC
  Administered 2024-01-02: 80 mg via ORAL
  Filled 2024-01-02 (×3): qty 1

## 2024-01-02 MED ORDER — AMLODIPINE BESYLATE 5 MG PO TABS
5.0000 mg | ORAL_TABLET | Freq: Every day | ORAL | Status: DC
  Administered 2024-01-02: 5 mg via ORAL
  Filled 2024-01-02 (×2): qty 1

## 2024-01-02 MED ORDER — FLEET ENEMA RE ENEM: 1.0000 | ENEMA | Freq: Once | RECTAL | Status: DC | PRN

## 2024-01-02 MED ORDER — PHENYLEPHRINE HCL-NACL 20-0.9 MG/250ML-% IV SOLN
INTRAVENOUS | Status: DC | PRN
  Administered 2024-01-02: 40 ug/min via INTRAVENOUS

## 2024-01-02 MED ORDER — CEFAZOLIN SODIUM-DEXTROSE 2-4 GM/100ML-% IV SOLN
2.0000 g | INTRAVENOUS | Status: AC
  Administered 2024-01-02: 2 g via INTRAVENOUS
  Filled 2024-01-02: qty 100

## 2024-01-02 MED ORDER — OXYCODONE HCL 5 MG PO TABS: 5.0000 mg | ORAL_TABLET | ORAL | Status: DC | PRN

## 2024-01-02 MED ORDER — LACTATED RINGERS IV SOLN
INTRAVENOUS | Status: DC
Start: 2024-01-02 — End: 2024-01-02

## 2024-01-02 MED ORDER — LOSARTAN POTASSIUM 25 MG PO TABS
25.0000 mg | ORAL_TABLET | Freq: Every day | ORAL | Status: DC
  Administered 2024-01-02: 25 mg via ORAL
  Filled 2024-01-02 (×2): qty 1

## 2024-01-02 MED ORDER — NITROGLYCERIN 0.4 MG SL SUBL: 0.4000 mg | SUBLINGUAL_TABLET | SUBLINGUAL | Status: DC | PRN

## 2024-01-02 MED ORDER — HYDROMORPHONE HCL 1 MG/ML IJ SOLN
1.0000 mg | INTRAMUSCULAR | Status: DC | PRN
  Administered 2024-01-02 – 2024-01-03 (×2): 1 mg via INTRAVENOUS
  Filled 2024-01-02 (×2): qty 1

## 2024-01-02 MED ORDER — ONDANSETRON HCL 4 MG PO TABS: 4.0000 mg | ORAL_TABLET | Freq: Four times a day (QID) | ORAL | Status: DC | PRN

## 2024-01-02 MED ORDER — BUPIVACAINE HCL (PF) 0.25 % IJ SOLN
INTRAMUSCULAR | Status: DC | PRN
  Administered 2024-01-02: 20 mL

## 2024-01-02 MED ORDER — VANCOMYCIN HCL 1000 MG IV SOLR
INTRAVENOUS | Status: AC
  Filled 2024-01-02: qty 20

## 2024-01-02 MED ORDER — METHOCARBAMOL 1000 MG/10ML IJ SOLN: 500.0000 mg | Freq: Four times a day (QID) | INTRAMUSCULAR | Status: DC | PRN

## 2024-01-02 MED ORDER — SUGAMMADEX SODIUM 200 MG/2ML IV SOLN
INTRAVENOUS | Status: DC | PRN
  Administered 2024-01-02 (×2): 100 mg via INTRAVENOUS
  Administered 2024-01-02: 200 mg via INTRAVENOUS

## 2024-01-02 MED ORDER — ONDANSETRON HCL 4 MG/2ML IJ SOLN: 4.0000 mg | Freq: Four times a day (QID) | INTRAMUSCULAR | Status: DC | PRN

## 2024-01-02 MED ORDER — 0.9 % SODIUM CHLORIDE (POUR BTL) OPTIME
TOPICAL | Status: DC | PRN
  Administered 2024-01-02: 1000 mL

## 2024-01-02 MED ORDER — FENTANYL CITRATE (PF) 250 MCG/5ML IJ SOLN
INTRAMUSCULAR | Status: DC | PRN
  Administered 2024-01-02 (×2): 50 ug via INTRAVENOUS

## 2024-01-02 MED ORDER — LORATADINE 10 MG PO TABS
10.0000 mg | ORAL_TABLET | Freq: Every day | ORAL | Status: DC
  Administered 2024-01-02: 10 mg via ORAL
  Filled 2024-01-02 (×2): qty 1

## 2024-01-02 MED ORDER — TAMSULOSIN HCL 0.4 MG PO CAPS
0.4000 mg | ORAL_CAPSULE | Freq: Every day | ORAL | Status: DC
  Administered 2024-01-02: 0.4 mg via ORAL
  Filled 2024-01-02 (×2): qty 1

## 2024-01-02 MED ORDER — PROPOFOL 10 MG/ML IV BOLUS
INTRAVENOUS | Status: DC | PRN
  Administered 2024-01-02: 130 mg via INTRAVENOUS

## 2024-01-02 MED ORDER — DEXAMETHASONE SODIUM PHOSPHATE 4 MG/ML IJ SOLN
4.0000 mg | Freq: Four times a day (QID) | INTRAMUSCULAR | Status: DC
  Administered 2024-01-02: 4 mg via INTRAVENOUS
  Filled 2024-01-02: qty 1

## 2024-01-02 MED ORDER — DEXAMETHASONE SODIUM PHOSPHATE 10 MG/ML IJ SOLN
INTRAMUSCULAR | Status: DC | PRN
  Administered 2024-01-02: 5 mg via INTRAVENOUS

## 2024-01-02 MED ORDER — ACETAMINOPHEN 325 MG PO TABS: 650.0000 mg | ORAL_TABLET | ORAL | Status: DC | PRN

## 2024-01-02 MED ORDER — PHENOL 1.4 % MT LIQD: 1.0000 | OROMUCOSAL | Status: DC | PRN

## 2024-01-02 MED ORDER — INSULIN ASPART 100 UNIT/ML IJ SOLN: 0.0000 [IU] | Freq: Three times a day (TID) | INTRAMUSCULAR | Status: DC

## 2024-01-02 MED ORDER — ALBUTEROL SULFATE HFA 108 (90 BASE) MCG/ACT IN AERS
INHALATION_SPRAY | RESPIRATORY_TRACT | Status: AC
  Filled 2024-01-02: qty 6.7

## 2024-01-02 MED ORDER — PANTOPRAZOLE SODIUM 40 MG PO TBEC
40.0000 mg | DELAYED_RELEASE_TABLET | Freq: Every day | ORAL | Status: DC
Start: 2024-01-02 — End: 2024-01-03
  Administered 2024-01-02: 40 mg via ORAL
  Filled 2024-01-02 (×2): qty 1

## 2024-01-02 SURGICAL SUPPLY — 51 items
BAG COUNTER SPONGE SURGICOUNT (BAG) ×1 IMPLANT
BENZOIN TINCTURE PRP APPL 2/3 (GAUZE/BANDAGES/DRESSINGS) ×1 IMPLANT
BLADE BONE MILL MEDIUM (MISCELLANEOUS) ×1 IMPLANT
BLADE CLIPPER SURG (BLADE) IMPLANT
BUR CUTTER 7.0 ROUND (BURR) IMPLANT
BUR MATCHSTICK NEURO 3.0 LAGG (BURR) ×1 IMPLANT
CAGE EXP CATALYFT 9 (Plate) IMPLANT
CANISTER SUCTION 3000ML PPV (SUCTIONS) ×1 IMPLANT
CNTNR URN SCR LID CUP LEK RST (MISCELLANEOUS) ×1 IMPLANT
COVER BACK TABLE 60X90IN (DRAPES) ×1 IMPLANT
DERMABOND ADVANCED .7 DNX12 (GAUZE/BANDAGES/DRESSINGS) ×1 IMPLANT
DRAPE C-ARM 42X72 X-RAY (DRAPES) ×2 IMPLANT
DRAPE HALF SHEET 40X57 (DRAPES) IMPLANT
DRAPE LAPAROTOMY 100X72X124 (DRAPES) ×1 IMPLANT
DRAPE SURG 17X23 STRL (DRAPES) ×4 IMPLANT
DRSG OPSITE POSTOP 4X6 (GAUZE/BANDAGES/DRESSINGS) ×1 IMPLANT
DURAPREP 26ML APPLICATOR (WOUND CARE) ×1 IMPLANT
ELECTRODE REM PT RTRN 9FT ADLT (ELECTROSURGICAL) ×1 IMPLANT
EVACUATOR 1/8 PVC DRAIN (DRAIN) IMPLANT
GAUZE 4X4 16PLY ~~LOC~~+RFID DBL (SPONGE) IMPLANT
GAUZE SPONGE 4X4 12PLY STRL (GAUZE/BANDAGES/DRESSINGS) IMPLANT
GLOVE BIO SURGEON STRL SZ 6 (GLOVE) ×1 IMPLANT
GLOVE BIOGEL PI IND STRL 6.5 (GLOVE) ×1 IMPLANT
GLOVE ECLIPSE 9.0 STRL (GLOVE) ×2 IMPLANT
GLOVE EXAM NITRILE XL STR (GLOVE) IMPLANT
GOWN STRL REUS W/ TWL LRG LVL3 (GOWN DISPOSABLE) IMPLANT
GOWN STRL REUS W/ TWL XL LVL3 (GOWN DISPOSABLE) ×2 IMPLANT
GOWN STRL REUS W/TWL 2XL LVL3 (GOWN DISPOSABLE) IMPLANT
KIT BASIN OR (CUSTOM PROCEDURE TRAY) ×1 IMPLANT
KIT TURNOVER KIT B (KITS) ×1 IMPLANT
NDL HYPO 22X1.5 SAFETY MO (MISCELLANEOUS) ×1 IMPLANT
NEEDLE HYPO 22X1.5 SAFETY MO (MISCELLANEOUS) ×1 IMPLANT
PACK LAMINECTOMY NEURO (CUSTOM PROCEDURE TRAY) ×1 IMPLANT
PUTTY GRAFTON DBF 6CC W/DELIVE (Putty) IMPLANT
ROD PREBENT 30MM LUMBAR (Rod) IMPLANT
SCREW SOLERA 45X6.5XMA NS SPNE (Screw) IMPLANT
SET SCREW SPINE 5.5X6 TI (Screw) IMPLANT
SOLN 0.9% NACL 1000 ML (IV SOLUTION) ×1 IMPLANT
SOLN 0.9% NACL POUR BTL 1000ML (IV SOLUTION) ×1 IMPLANT
SOLN STERILE WATER 1000 ML (IV SOLUTION) ×1 IMPLANT
SOLN STERILE WATER BTL 1000 ML (IV SOLUTION) ×1 IMPLANT
SPIKE FLUID TRANSFER (MISCELLANEOUS) ×1 IMPLANT
SPONGE SURGIFOAM ABS GEL 100 (HEMOSTASIS) ×1 IMPLANT
STRIP CLOSURE SKIN 1/2X4 (GAUZE/BANDAGES/DRESSINGS) ×2 IMPLANT
SUT VIC AB 0 CT1 18XCR BRD8 (SUTURE) ×2 IMPLANT
SUT VIC AB 2-0 CT1 18 (SUTURE) ×1 IMPLANT
SUT VIC AB 3-0 SH 8-18 (SUTURE) ×2 IMPLANT
TOWEL GREEN STERILE (TOWEL DISPOSABLE) ×1 IMPLANT
TOWEL GREEN STERILE FF (TOWEL DISPOSABLE) ×1 IMPLANT
TRAP SPECIMEN MUCUS 40CC (MISCELLANEOUS) IMPLANT
TRAY FOLEY MTR SLVR 16FR STAT (SET/KITS/TRAYS/PACK) ×1 IMPLANT

## 2024-01-02 NOTE — Anesthesia Procedure Notes (Signed)
 Procedure Name: Intubation Date/Time: 01/02/2024 8:03 AM  Performed by: Wynonia Alfonzo LABOR, CRNAPre-anesthesia Checklist: Patient identified, Emergency Drugs available, Suction available and Patient being monitored Patient Re-evaluated:Patient Re-evaluated prior to induction Oxygen  Delivery Method: Circle System Utilized Preoxygenation: Pre-oxygenation with 100% oxygen  Induction Type: IV induction Ventilation: Mask ventilation without difficulty Laryngoscope Size: Mac and 4 Grade View: Grade III Tube type: Oral Tube size: 7.5 mm Number of attempts: 1 Airway Equipment and Method: Stylet and Oral airway Placement Confirmation: ETT inserted through vocal cords under direct vision, positive ETCO2 and breath sounds checked- equal and bilateral Secured at: 23 cm Tube secured with: Tape Dental Injury: Teeth and Oropharynx as per pre-operative assessment

## 2024-01-02 NOTE — Brief Op Note (Signed)
 01/02/2024  10:55 AM  PATIENT:  [name]  63 y.o. male  PRE-OPERATIVE DIAGNOSIS:  Degenerative spondylolisthesis  POST-OPERATIVE DIAGNOSIS:  Degenerative spondylolisthesis  PROCEDURE:  Procedure(s): POSTERIOR LATERAL AND INTERBODY  FUSION  LUMBAR FOUR-LUMBAR FIVE (N/A)  SURGEON:  Surgeons and Role:    DEWAINE Louis Shove, MD - Primary  PHYSICIAN ASSISTANT:   ASSISTANTSBETHA Jennetta PIETY   ANESTHESIA:   general  EBL:  350 mL   BLOOD ADMINISTERED:none  DRAINS: none   LOCAL MEDICATIONS USED:  MARCAINE      SPECIMEN:  No Specimen  DISPOSITION OF SPECIMEN:  N/A  COUNTS:  YES  TOURNIQUET:  * No tourniquets in log *  DICTATION: .Dragon Dictation  PLAN OF CARE: Admit for overnight observation  PATIENT DISPOSITION:  PACU - hemodynamically stable.   Delay start of Pharmacological VTE agent (>24hrs) due to surgical blood loss or risk of bleeding: yes

## 2024-01-02 NOTE — Anesthesia Preprocedure Evaluation (Addendum)
 Anesthesia Evaluation  Patient identified by MRN, date of birth, ID band Patient awake    Reviewed: Allergy & Precautions, NPO status , Patient's Chart, lab work & pertinent test results  History of Anesthesia Complications Negative for: history of anesthetic complications  Airway Mallampati: I       Dental no notable dental hx. (+) Partial Upper, Dental Advisory Given   Pulmonary COPD, neg recent URI   breath sounds clear to auscultation       Cardiovascular hypertension, pulmonary hypertension+ dysrhythmias Atrial Fibrillation and Supra Ventricular Tachycardia  Rhythm:Regular Rate:Normal  TTE (2024): 1. Left ventricular ejection fraction, by visual estimation, is >75%. The  left ventricle has hyperdynamic function. There is moderately increased  left ventricular hypertrophy.   2. Left ventricular diastolic parameters are indeterminate.   3. The left ventricle has no regional wall motion abnormalities.   4. Global right ventricle has normal systolic function.The right  ventricular size is normal. No increase in right ventricular wall  thickness.   5. Left atrial size was normal.   6. Right atrial size was normal.   7. Trivial pericardial effusion is present.   8. The mitral valve is normal in structure. No evidence of mitral valve  regurgitation.   9. The tricuspid valve is normal in structure.  10. The tricuspid valve is normal in structure. Tricuspid valve  regurgitation is not demonstrated.  11. The aortic valve is grossly normal. Aortic valve regurgitation is not  visualized. No evidence of aortic valve sclerosis or stenosis.  12. The pulmonic valve was grossly normal. Pulmonic valve regurgitation is  not visualized.  13. Asymmetric LVH, more prominent in septum. No SAM or significant LVOT  gradient noted on this study.     Neuro/Psych    GI/Hepatic ,GERD  ,,  Endo/Other    Renal/GU       Musculoskeletal  (+) Arthritis ,  Gout   Abdominal   Peds  Hematology  (+) Blood dyscrasia, anemia   Anesthesia Other Findings   Reproductive/Obstetrics                              Anesthesia Physical Anesthesia Plan  ASA: 3  Anesthesia Plan: General   Post-op Pain Management:    Induction: Intravenous  PONV Risk Score and Plan: 1  Airway Management Planned: Oral ETT  Additional Equipment:   Intra-op Plan:   Post-operative Plan: Extubation in OR  Informed Consent:      Dental advisory given  Plan Discussed with: CRNA and Surgeon  Anesthesia Plan Comments:          Anesthesia Quick Evaluation

## 2024-01-02 NOTE — Anesthesia Postprocedure Evaluation (Signed)
 Anesthesia Post Note  Patient: [name]  Procedure(s) Performed: POSTERIOR LATERAL AND INTERBODY  FUSION  LUMBAR FOUR-LUMBAR FIVE (Back)     Patient location during evaluation: PACU Anesthesia Type: General Level of consciousness: awake Pain management: pain level controlled Vital Signs Assessment: post-procedure vital signs reviewed and stable Respiratory status: spontaneous breathing Cardiovascular status: stable Postop Assessment: no apparent nausea or vomiting Anesthetic complications: no   No notable events documented.                Lauraine KATHEE [place]

## 2024-01-02 NOTE — Op Note (Signed)
 Date of procedure: 01/02/2024  Date of dictation: Same  Service: Neurosurgery  Preoperative diagnosis: L4-5 degenerative spondylolisthesis with severe stenosis  Postoperative diagnosis: Same  Procedure Name: Bilateral L4-5 decompressive laminotomies and foraminotomies, more than would be required for simple interbody fusion alone.  Bilateral L4-5 ponte osteotomies required to release to fix deformity at L4-5  L4-5 posterior lumbar interbody fusion utilizing interbody cages, locally harvested autograft, and morselized allograft  L4-5 posterolateral arthrodesis utilizing nonsegmental pedicle screw fixation and local autografting.  Surgeon:Jimia Gentles A.Artavia Jeanlouis, M.D.  Asst. Surgeon: Jennetta, NP; assistant utilized for exposure, decompression, fusion and instrumentation and closure  Anesthesia: General  Indication: 63 year old male with severe back and bilateral lower extremity symptoms right greater than left failing conservative management workup demonstrates evidence of degenerative spondylolisthesis with severe facet arthropathy and marked lateral recess and foraminal stenosis at L4-5.  Patient presents now for decompression and fusion in hopes improving his symptoms.  Operative note: After induction of anesthesia, patient position prone on the Wilson frame and appropriately padded.  Lumbar region prepped and draped sterilely.  Incision made overlying L4-5.  Dissection performed bilaterally.  Retractor placed.  Fluoroscopy used.  Levels confirmed.  Decompressive laminotomies and facetectomies were then performed using high-speed drill and Kerrison rongeurs to remove the inferior two thirds of the lamina of L4 the entire inferior facet of L4 was removed.  The majority the superior facet of L5 was resected.  The superior aspect of the L5 lamina was resected.  Ligament flavum and arthritic pannus was resected fully decompressing the thecal sac and L4 and L5 nerve roots.  Bilateral discectomy was then  performed at L4-5.  The deformity at L4-5 was fixed.  I therefore completed ponte osteotomies by further resecting lateral facet at L4 and L5 in order to mobilize the L4-5 segment to help with reduction and aid in decompression.  After completing the ponte osteotomies I returned to the discectomy where I was able to fully reduce the disc space.  Disc base was then prepared for interbody fusion.  With the distractor placed patient's right side.  The space was then further prepared for interbody fusion and then a 9 mm Medtronic expandable cage was then impacted in place and expanded.  Distractor removed patient's right side.  Disc base prepared on the right side.  Morselized autograft packed in the interspace.  A second cage was then impacted in place and expanded.  Pedicles at L4 and L5 were then identified using surface landmarks and intraoperative fluoroscopy and superficial bone was then removed overlying the pedicles at L4 and L5.  Each pedicle tract was probed and found to be solidly within the bone.  Each pedicle tract was then tapped with a screw tap which was also probed and found to be solidly in the bone.  6.5 mm Solera screws from Medtronic were placed bilaterally at L4 and L5.  Final images reveal good position of the cage and the hardware at proper operative level with normal alignment of spine.  Short segment titanium rod placed over the screws at L4 and L5.  Locking caps left with a few heads locking caps then engaged with the construct under mild compression.  Transverse processes were decorticated.  Morselized autograft was packed posterior laterally.  Each cage was then packed with demineralized bone fibers.  Gelfoam placed over the laminotomy defects.  Vancomycin  placed in the deep wound space.  Wounds and closed in layers of Vicryl sutures.  Steri-Strips and sterile dressing were applied.  No apparent complications.  Patient tolerated the procedure well and he returns to recovery room postop.

## 2024-01-02 NOTE — OR Nursing (Signed)
 Attempted foley insertion 16 fr catheter unable to insert Dr Louis made aware stated okay leave foley out

## 2024-01-02 NOTE — H&P (Signed)
 [name] is an 63 y.o. male.   Chief Complaint: Back pain HPI: 63 year old male with chronic and progressively worsening lumbar pain with radiation predominantly into his right lower extremity with associated numbness and some intermittent weakness.  Workup demonstrates evidence of degenerative spondylolisthesis with severe stenosis at L4-5.  Patient has failed conservative management and presents now for L4-5 decompression and fusion in hopes improving his symptoms.  Past Medical History:  Diagnosis Date   A-fib [facility])    Arthritis    Chest pain    a. 11/2002 Cath: EF 68%, LM nl, LAD small, nl, LCX nl, RCA tortuous/nl;  b. 08/2011 St Echo: EF 60%, normal contractility, no scar/ischemia.   COPD (chronic obstructive pulmonary disease) (HCC)    Dysrhythmia    A.fib   Enlarged prostate    Gastrointestinal hemorrhage 08/25/2018   GERD (gastroesophageal reflux disease)    GI bleeding 08/2018   Headache    Hypertension    Sickle cell trait    Sleep apnea    SVT (supraventricular tachycardia)    Thyroid  nodule     Past Surgical History:  Procedure Laterality Date   ANTERIOR CERVICAL DECOMP/DISCECTOMY FUSION N/A 08/10/2021   Procedure: CERVICAL THREE-FOUR, CERVICAL FOUR-FIVE, CERVICAL FIVE-SIX ANTERIOR CERVICAL DECOMPRESSION/DISCECTOMY FUSION;  Surgeon: Louis Shove, MD;  Location: [facility] OR;  Service: Neurosurgery;  Laterality: N/A;  3C/RM 20   ATRIAL FIBRILLATION ABLATION     BIOPSY  08/27/2018   Procedure: BIOPSY;  Surgeon: Eda Iha, MD;  Location: [facility] ENDOSCOPY;  Service: Gastroenterology;;   BIOPSY  08/28/2018   Procedure: BIOPSY;  Surgeon: Eda Iha, MD;  Location: [facility] ENDOSCOPY;  Service: Gastroenterology;;   COLONOSCOPY WITH PROPOFOL  N/A 08/28/2018   Procedure: COLONOSCOPY WITH PROPOFOL ;  Surgeon: Eda Iha, MD;  Location: [facility] ENDOSCOPY;  Service: Gastroenterology;  Laterality: N/A;   ENTEROSCOPY N/A 08/27/2018   Procedure: ENTEROSCOPY;  Surgeon: Eda Iha, MD;  Location: [facility] ENDOSCOPY;  Service: Gastroenterology;  Laterality: N/A;   GIVENS CAPSULE STUDY N/A 08/28/2018   Procedure: GIVENS CAPSULE STUDY;  Surgeon: Eda Iha, MD;  Location: [facility] ENDOSCOPY;  Service: Gastroenterology;  Laterality: N/A;   INGUINAL HERNIA REPAIR Bilateral 2023   LEFT HEART CATHETERIZATION WITH CORONARY ANGIOGRAM N/A 03/11/2013   Procedure: LEFT HEART CATHETERIZATION WITH CORONARY ANGIOGRAM;  Surgeon: Toribio JONELLE Fuel, MD;  Location: [facility] CATH LAB;  Service: Cardiovascular;  Laterality: N/A;   POLYPECTOMY  08/28/2018   Procedure: POLYPECTOMY;  Surgeon: Eda Iha, MD;  Location: [facility] ENDOSCOPY;  Service: Gastroenterology;;   TONSILLECTOMY     TRANSURETHRAL RESECTION OF PROSTATE      Family History  Problem Relation Age of Onset   CAD Mother        died in her 65's - MI   Multiple myeloma Brother    Sickle cell anemia Father        died @ 15   Gastric cancer Neg Hx    Social History:  reports that he has never smoked. He has never used smokeless tobacco. He reports that he does not drink alcohol and does not use drugs.  Allergies: No Known Allergies  Facility-Administered Medications Prior to Admission  Medication Dose Route Frequency Provider Last Rate Last Admin   lidocaine  (XYLOCAINE ) 1 % (with pres) injection 6 mL  6 mL Other Once        Medications Prior to Admission  Medication Sig Dispense Refill   albuterol  (VENTOLIN  HFA) 108 (90 Base) MCG/ACT inhaler Inhale 1 puff into the lungs as needed  for wheezing or shortness of breath.     amLODipine  (NORVASC ) 5 MG tablet Take 1 tablet (5 mg total) by mouth daily. 30 tablet 0   apixaban  (ELIQUIS ) 5 MG TABS tablet Take 1 tablet (5 mg total) by mouth 2 (two) times daily. Take Eliquis  5 mg twice a day one week after GI bleed.  Do not restart Xarelto. (Patient taking differently: Take 5 mg by mouth 2 (two) times daily.) 60 tablet 0   atorvastatin  (LIPITOR) 10 MG tablet Take 10 mg by mouth daily.      cetirizine (ZYRTEC) 10 MG tablet Take 10 mg by mouth daily as needed for allergies.      colchicine 0.6 MG tablet Take 0.6 mg by mouth daily as needed (gout flare).     cyclobenzaprine  (FLEXERIL ) 10 MG tablet Take 1 tablet (10 mg total) by mouth 3 (three) times daily as needed for muscle spasms. 90 tablet 5   diclofenac  (VOLTAREN ) 75 MG EC tablet Take 75 mg by mouth as needed for mild pain (pain score 1-3).     fluticasone  (FLONASE ) 50 MCG/ACT nasal spray Place 1 spray into both nostrils daily as needed for allergies or rhinitis.     Fluticasone -Salmeterol (ADVAIR) 250-50 MCG/DOSE AEPB Inhale 1 puff into the lungs 2 (two) times daily as needed (wheezing).     guaiFENesin  (MUCINEX ) 600 MG 12 hr tablet Take 600 mg by mouth daily as needed for to loosen phlegm.     HYDROcodone -acetaminophen  (NORCO/VICODIN) 5-325 MG tablet Take 1 tablet by mouth every 4 (four) hours as needed for severe pain (pain score 7-10). 15 tablet 0   linaclotide  (LINZESS ) 145 MCG CAPS capsule Take 145 mcg by mouth daily as needed (Constipation).     losartan  (COZAAR ) 25 MG tablet Take 25 mg by mouth daily.     nitroGLYCERIN  (NITROSTAT ) 0.4 MG SL tablet Place 1 tablet (0.4 mg total) under the tongue every 5 (five) minutes as needed for chest pain. 20 tablet 1   pantoprazole  (PROTONIX ) 40 MG tablet Take 40 mg by mouth daily.     polyethylene glycol (MIRALAX  / GLYCOLAX ) 17 g packet Take 17 g by mouth daily. (Patient taking differently: Take 17 g by mouth daily as needed for mild constipation.) 14 each 0   Prenatal Vit-Fe Fumarate-FA (WESTAB PLUS) 27-1 MG TABS Take 1 tablet by mouth daily.     sildenafil  (REVATIO ) 20 MG tablet Take 60-100 mg by mouth daily as needed (ED).     sotalol  (BETAPACE ) 80 MG tablet Take 80 mg by mouth every 12 (twelve) hours.     SUMAtriptan  (IMITREX ) 100 MG tablet Take 100 mg by mouth as directed. Take one tablet at onset, then an additional tablet every 12 hours as needed for migraine     tamsulosin   (FLOMAX ) 0.4 MG CAPS capsule Take 0.4 mg by mouth daily.     traMADol  (ULTRAM ) 50 MG tablet Take 1 tablet (50 mg total) by mouth every 12 (twelve) hours as needed. (Patient taking differently: Take 50 mg by mouth every 6 (six) hours as needed for moderate pain (pain score 4-6).) 60 tablet 3   acetaminophen  (TYLENOL ) 325 MG tablet Take 650 mg by mouth every 6 (six) hours as needed for moderate pain.     ferrous sulfate  325 (65 FE) MG tablet Take 1 tablet (325 mg total) by mouth daily with breakfast. (Patient not taking: Reported on 11/19/2023) 60 tablet 0   lidocaine  (LIDODERM ) 5 % Place 1 patch onto the skin  daily. Remove & Discard patch within 12 hours or as directed by MD (Patient not taking: Reported on 11/19/2023) 9 patch 0   methocarbamol  (ROBAXIN ) 500 MG tablet Take 1 tablet (500 mg total) by mouth 2 (two) times daily. (Patient not taking: Reported on 11/19/2023) 20 tablet 0   NON FORMULARY CPAP at bedtime     Oxcarbazepine  (TRILEPTAL ) 300 MG tablet Take 1 tablet (300 mg total) by mouth in the morning, at noon, and at bedtime. (Patient not taking: Reported on 11/19/2023) 90 tablet 6   triamterene -hydrochlorothiazide  (DYAZIDE ) 37.5-25 MG capsule Take 1 each (1 capsule total) by mouth every morning. (Patient not taking: Reported on 11/19/2023)      No results found for this or any previous visit (from the past 48 hours). No results found.  Pertinent items noted in HPI and remainder of comprehensive ROS otherwise negative.  Blood pressure (!) 155/99, pulse 64, temperature 98.6 F (37 C), temperature source Oral, resp. rate 17, height 5' 11 (1.803 m), weight 93 kg, SpO2 98%.  Patient is awake and alert.  He is oriented and appropriate.  Speech is fluent.  Judgment insight are intact.  Cranial nerve function normal bilaterally motor examination reveals intact motor strength bilaterally aside from some slight dorsiflexion weakness on the right.  Sensory examination with decrease sensation.  Light  touch in his right L5 dermatome.  Deep tendon reflexes normal active.  No evidence of long track signs.  Gait antalgic.  Posture mildly flexed peer examination head ears eyes nose and throat is unremarkable chest and abdomen are benign.  Extremities are free of major deformity. Assessment/Plan L4-5 degenerative spondylolisthesis with stenosis.  Plan L4-5 decompressive laminotomy with foraminotomies followed by posterior lumbar MRI fusion utilizing interbody cage and locally harvested autograft coupled with posterior arthrodesis utilizing nonsegmental pedicle screw fixation and local autograft.  Risks and benefits of been explained.  Patient wishes proceed.  Victory A Laruen Risser 01/02/2024, 7:10 AM

## 2024-01-02 NOTE — Progress Notes (Signed)
 Orthopedic Tech Progress Note Patient Details:  [name] Jan 20, 1961 [id number]  Patient ID: [name], male   DOB: 25-Mar-1961, 63 y.o.   MRN: [id number] Pt. Brought lumbar with him. [name] 01/02/2024, 1:43 PM

## 2024-01-02 NOTE — Transfer of Care (Signed)
 Immediate Anesthesia Transfer of Care Note  Patient: [name]  Procedure(s) Performed: POSTERIOR LATERAL AND INTERBODY  FUSION  LUMBAR FOUR-LUMBAR FIVE (Back)  Patient Location: PACU  Anesthesia Type:General  Level of Consciousness: awake and drowsy  Airway & Oxygen  Therapy: Patient Spontanous Breathing  Post-op Assessment: Report given to RN, Post -op Vital signs reviewed and stable, and Patient moving all extremities  Post vital signs: Reviewed and stable  Last Vitals:  Vitals Value Taken Time  BP 140/94 01/02/24 11:11  Temp    Pulse 62 01/02/24 11:13  Resp 10 01/02/24 11:13  SpO2 100 % 01/02/24 11:13  Vitals shown include unfiled device data.  Last Pain:  Vitals:   01/02/24 0658  TempSrc:   PainSc: 7       Patients Stated Pain Goal: 2 (01/02/24 9341)  Complications: No notable events documented.

## 2024-01-03 DIAGNOSIS — M4316 Spondylolisthesis, lumbar region: Secondary | ICD-10-CM | POA: Diagnosis not present

## 2024-01-03 MED ORDER — METHYLPREDNISOLONE 4 MG PO TBPK: ORAL_TABLET | ORAL | 0 refills | Status: AC

## 2024-01-03 MED ORDER — CYCLOBENZAPRINE HCL 10 MG PO TABS: 10.0000 mg | ORAL_TABLET | Freq: Three times a day (TID) | ORAL | 5 refills | Status: AC | PRN

## 2024-01-03 MED ORDER — OXYCODONE-ACETAMINOPHEN 10-325 MG PO TABS: 1.0000 | ORAL_TABLET | ORAL | 0 refills | Status: AC | PRN

## 2024-01-03 NOTE — Care Management (Signed)
 Patient with order to DC to home today. Unit staff to provide DME needed for home.  Discussed HH recommendations with patient and provided with [facility] agency choices. Patient chose [facility] . Referral accepted.   Liaison for agency has been notified of DC. Information added to AVS  Patient will have family/ friends provide transportation home. No other TOC needs identified for DC

## 2024-01-03 NOTE — Discharge Summary (Signed)
 Physician Discharge Summary  Patient ID: [name] MRN: [id number] DOB/AGE: 1961-01-01 63 y.o.  Admit date: 01/02/2024 Discharge date: 01/03/2024  Admission Diagnoses:   L4-5 degenerative spondylolisthesis with severe stenosis    Discharge Diagnoses: same   Discharged Condition: good  Hospital Course: The patient was admitted on 01/02/2024 and taken to the operating room where the patient underwent PLIF L4-5. The patient tolerated the procedure well and was taken to the recovery room and then to the floor in stable condition. The hospital course was routine. There were no complications. The wound remained clean dry and intact. Pt had appropriate back soreness. No complaints of leg pain or new N/T/W. The patient remained afebrile with stable vital signs, and tolerated a regular diet. The patient continued to increase activities, and pain was well controlled with oral pain medications.   Consults: None  Significant Diagnostic Studies:  Results for orders placed or performed during the hospital encounter of 01/02/24  Surgical pcr screen   Collection Time: 01/02/24  8:54 AM   Specimen: Nasal Mucosa; Nasal Swab  Result Value Ref Range   MRSA, PCR NEGATIVE NEGATIVE   Staphylococcus aureus NEGATIVE NEGATIVE    DG Lumbar Spine 2-3 Views Result Date: 01/02/2024 EXAM: 2 or 3 VIEW(S) XRAY OF THE LUMBAR SPINE 01/02/2024 10:50:00 AM COMPARISON: None available. CLINICAL HISTORY: Elective L4-L5 PLIF (Posterior Lumbar Interbody Fusion) with RSTO (Right Sacroiliac Joint Fusion) by TMS (Transforaminal Lumbar Interbody Fusion). FINDINGS: LUMBAR SPINE: BONES: Intraoperative spot images demonstrate bilateral pedicle screw placement at L4 and L5 with interbody spacers. The pedicle screws appear satisfactorily positioned. The posterolateral rods have not yet been placed in the images provided. No acute fracture. No aggressive appearing osseous lesion. Alignment is normal. DISCS AND DEGENERATIVE CHANGES:  Interbody spacers are present at L4-L5. SOFT TISSUES: No acute abnormality. IMPRESSION: 1. Intraoperative bilateral pedicle screws at L4L5 with interbody spacers; screws appear satisfactorily positioned. Electronically signed by: Ryan Salvage MD 01/02/2024 03:09 PM EDT RP Workstation: [id number]   DG C-Arm 1-60 Min-No Report Result Date: 01/02/2024 Fluoroscopy was utilized by the requesting physician.  No radiographic interpretation.   DG C-Arm 1-60 Min-No Report Result Date: 01/02/2024 Fluoroscopy was utilized by the requesting physician.  No radiographic interpretation.    Antibiotics:  Anti-infectives (From admission, onward)    Start     Dose/Rate Route Frequency Ordered Stop   01/02/24 1230  ceFAZolin  (ANCEF ) IVPB 1 g/50 mL premix        1 g 100 mL/hr over 30 Minutes Intravenous Every 8 hours 01/02/24 1217 01/02/24 2108   01/02/24 1040  vancomycin  (VANCOCIN ) powder  Status:  Discontinued          As needed 01/02/24 1041 01/02/24 1106   01/02/24 0645  ceFAZolin  (ANCEF ) IVPB 2g/100 mL premix        2 g 200 mL/hr over 30 Minutes Intravenous On call to O.R. 01/02/24 9355 01/02/24 0820       Discharge Exam: Blood pressure 135/89, pulse 80, temperature 98.3 F (36.8 C), temperature source Oral, resp. rate 18, height 5' 11 (1.803 m), weight 93 kg, SpO2 97%. Neurologic: Grossly normal Ambulating and voiding well incision cdi   Discharge Medications:   Allergies as of 01/03/2024   No Known Allergies      Medication List     STOP taking these medications    apixaban  5 MG Tabs tablet Commonly known as: Eliquis    HYDROcodone -acetaminophen  5-325 MG tablet Commonly known as: NORCO/VICODIN   methocarbamol  500 MG tablet  Commonly known as: ROBAXIN    traMADol  50 MG tablet Commonly known as: ULTRAM        TAKE these medications    acetaminophen  325 MG tablet Commonly known as: TYLENOL  Take 650 mg by mouth every 6 (six) hours as needed for moderate pain.    amLODipine  5 MG tablet Commonly known as: NORVASC  Take 1 tablet (5 mg total) by mouth daily.   atorvastatin  10 MG tablet Commonly known as: LIPITOR Take 10 mg by mouth daily.   cetirizine 10 MG tablet Commonly known as: ZYRTEC Take 10 mg by mouth daily as needed for allergies.   colchicine 0.6 MG tablet Take 0.6 mg by mouth daily as needed (gout flare).   cyclobenzaprine  10 MG tablet Commonly known as: FLEXERIL  Take 1 tablet (10 mg total) by mouth 3 (three) times daily as needed for muscle spasms.   diclofenac  75 MG EC tablet Commonly known as: VOLTAREN  Take 75 mg by mouth as needed for mild pain (pain score 1-3).   ferrous sulfate  325 (65 FE) MG tablet Take 1 tablet (325 mg total) by mouth daily with breakfast.   fluticasone  50 MCG/ACT nasal spray Commonly known as: FLONASE  Place 1 spray into both nostrils daily as needed for allergies or rhinitis.   Fluticasone -Salmeterol 250-50 MCG/DOSE Aepb Commonly known as: ADVAIR Inhale 1 puff into the lungs 2 (two) times daily as needed (wheezing).   guaiFENesin  600 MG 12 hr tablet Commonly known as: MUCINEX  Take 600 mg by mouth daily as needed for to loosen phlegm.   lidocaine  5 % Commonly known as: Lidoderm  Place 1 patch onto the skin daily. Remove & Discard patch within 12 hours or as directed by MD   linaclotide  145 MCG Caps capsule Commonly known as: LINZESS  Take 145 mcg by mouth daily as needed (Constipation).   losartan  25 MG tablet Commonly known as: COZAAR  Take 25 mg by mouth daily.   methylPREDNISolone  4 MG Tbpk tablet Commonly known as: MEDROL  DOSEPAK Take as directed   nitroGLYCERIN  0.4 MG SL tablet Commonly known as: NITROSTAT  Place 1 tablet (0.4 mg total) under the tongue every 5 (five) minutes as needed for chest pain.   NON FORMULARY CPAP at bedtime   Oxcarbazepine  300 MG tablet Commonly known as: Trileptal  Take 1 tablet (300 mg total) by mouth in the morning, at noon, and at bedtime.    oxyCODONE -acetaminophen  10-325 MG tablet Commonly known as: Percocet Take 1 tablet by mouth every 4 (four) hours as needed for pain.   pantoprazole  40 MG tablet Commonly known as: PROTONIX  Take 40 mg by mouth daily.   polyethylene glycol 17 g packet Commonly known as: MIRALAX  / GLYCOLAX  Take 17 g by mouth daily. What changed:  when to take this reasons to take this   sildenafil  20 MG tablet Commonly known as: REVATIO  Take 60-100 mg by mouth daily as needed (ED).   sotalol  80 MG tablet Commonly known as: BETAPACE  Take 80 mg by mouth every 12 (twelve) hours.   SUMAtriptan  100 MG tablet Commonly known as: IMITREX  Take 100 mg by mouth as directed. Take one tablet at onset, then an additional tablet every 12 hours as needed for migraine   tamsulosin  0.4 MG Caps capsule Commonly known as: FLOMAX  Take 0.4 mg by mouth daily.   triamterene -hydrochlorothiazide  37.5-25 MG capsule Commonly known as: DYAZIDE  Take 1 each (1 capsule total) by mouth every morning.   Ventolin  HFA 108 (90 Base) MCG/ACT inhaler Generic drug: albuterol  Inhale 1 puff into the lungs as  needed for wheezing or shortness of breath.   WesTab Plus 27-1 MG Tabs Take 1 tablet by mouth daily.               Durable Medical Equipment  (From admission, onward)           Start     Ordered   01/02/24 1218  DME Walker rolling  Once       Question:  Patient needs a walker to treat with the following condition  Answer:  Degenerative spondylolisthesis   01/02/24 1217   01/02/24 1218  DME 3 n 1  Once        01/02/24 1217            Disposition: home   Final Dx: PLIF L4-5  Discharge Instructions      Remove dressing in 72 hours   Complete by: As directed    Diet - low sodium heart healthy   Complete by: As directed    Driving Restrictions   Complete by: As directed    No driving for 2 weeks, no riding in the car for 1 week   Increase activity slowly   Complete by: As directed    Lifting  restrictions   Complete by: As directed    No lifting more than 8 lbs          Signed: Suzen Lacks Tuyet Bader 01/03/2024, 6:11 AM

## 2024-01-03 NOTE — Progress Notes (Signed)
 Patient alert and oriented, voided, ambulate. Surgical site clean and dry no sign of infection. D/c instructions explain and given to the patient and family, all questions answered. Patient d/c home with 3 in 1 per order.

## 2024-01-03 NOTE — Evaluation (Signed)
 Physical Therapy Evaluation Patient Details Name: [name] MRN: [id number] DOB: Dec 20, 1960 Today's Date: 01/03/2024  History of Present Illness  Pt is a 63 y.o. male s/p bil PLIF L4-5. PMH: afib, sickle cell, arthritis, COPD, GERD, GIB, HTN, SVT, sleep apnea , ACDF  Clinical Impression  Patient is s/p above surgery resulting in functional limitations due to the deficits listed below (see PT Problem List). Patient currently limited by pain and continued LLE weakness. Pt required CGA for bed mobility (with education in technique), CGA for transfers (again with education), and CGA to ambulate with RW x 70 ft. All education completed re: back precautions, mobility, and safe use of DME. Patient educated that if his daughter cannot find his RW from previous surgery, that he can order one from [place]. Patient will benefit from acute skilled PT to increase their independence and safety with mobility to facilitate discharge. Patient will be home alone beginning in 2 days and would benefit from HHPT to advance his mobility and independence. Reports he researched companies his insurance will cover and prefers [facility] company.          If plan is discharge home, recommend the following: A little help with walking and/or transfers;Assistance with cooking/housework;Assist for transportation   Can travel by private vehicle        Equipment Recommendations BSC/3in1  Recommendations for Other Services       Functional Status Assessment Patient has had a recent decline in their functional status and demonstrates the ability to make significant improvements in function in a reasonable and predictable amount of time.     Precautions / Restrictions Precautions Precautions: Back Precaution Booklet Issued: Yes (comment) Recall of Precautions/Restrictions: Intact Required Braces or Orthoses: Spinal Brace Spinal Brace: Lumbar corset;Applied in sitting position Restrictions Weight Bearing  Restrictions Per Provider Order: No      Mobility  Bed Mobility Overal bed mobility: Needs Assistance Bed Mobility: Rolling, Sidelying to Sit, Sit to Sidelying Rolling: Contact guard assist Sidelying to sit: Contact guard assist     Sit to sidelying: Min assist (assist for LEs into bed) General bed mobility comments: HOB flat, no rail    Transfers Overall transfer level: Needs assistance Equipment used: Rolling walker (2 wheels) Transfers: Sit to/from Stand Sit to Stand: Contact guard assist           General transfer comment: from bed at lowest height, one hand on center of RW, one on bed; slightly unsteady as transitioning hand up to RW but no physical assist given    Ambulation/Gait Ambulation/Gait assistance: Contact guard assist Gait Distance (Feet): 70 Feet Assistive device: Rolling walker (2 wheels) Gait Pattern/deviations: Step-through pattern, Decreased stride length, Knee flexed in stance - left   Gait velocity interpretation: <1.8 ft/sec, indicate of risk for recurrent falls   General Gait Details: RW shortened to fit his ht (and educated on how to fit his RW at home); pt with good upright posture; does not fully extend lt knee in stance with no buckling noted  Stairs Stairs:  (educated on over threshold with RW and appropriate LE sequencing for in/out of house)          Wheelchair Mobility     Tilt Bed    Modified Rankin (Stroke Patients Only)       Balance Overall balance assessment: Needs assistance Sitting-balance support: Single extremity supported, Bilateral upper extremity supported, No upper extremity supported, Feet supported Sitting balance-Leahy Scale: Fair     Standing balance support:  Single extremity supported, Bilateral upper extremity supported, During functional activity, Reliant on assistive device for balance Standing balance-Leahy Scale: Poor                               Pertinent Vitals/Pain Pain  Assessment Pain Assessment: 0-10 Pain Score: 5  Pain Location: back, LLE Pain Descriptors / Indicators: Aching, Discomfort, Grimacing, Guarding, Operative site guarding Pain Intervention(s): Limited activity within patient's tolerance, Monitored during session, Repositioned    Home Living Family/patient expects to be discharged to:: Private residence Living Arrangements: Alone Available Help at Discharge: Other (Comment) (significant other) Type of Home: House Home Access: Level entry   Entrance Stairs-Number of Steps: threshold   Home Layout: One level Home Equipment: Agricultural consultant (2 wheels) (knows he got one before; dtr to try to locate)      Prior Function Prior Level of Function : Independent/Modified Independent             Mobility Comments: no AD ADLs Comments: independent per report     Extremity/Trunk Assessment   Upper Extremity Assessment Upper Extremity Assessment: Defer to OT evaluation    Lower Extremity Assessment Lower Extremity Assessment: LLE deficits/detail LLE Deficits / Details: reports true weakness compared to RLE prior to surgery; difficult to assess due to pain LLE: Unable to fully assess due to pain    Cervical / Trunk Assessment Cervical / Trunk Assessment: Back Surgery  Communication   Communication Communication: No apparent difficulties    Cognition Arousal: Alert Behavior During Therapy: WFL for tasks assessed/performed                             Following commands: Intact       Cueing Cueing Techniques: Verbal cues, Gestural cues     General Comments General comments (skin integrity, edema, etc.): improved posture with RW adjusted to his ht    Exercises     Assessment/Plan    PT Assessment Patient needs continued PT services  PT Problem List Decreased strength;Decreased activity tolerance;Decreased balance;Decreased mobility;Decreased knowledge of use of DME;Pain       PT Treatment Interventions  DME instruction;Gait training;Functional mobility training;Therapeutic activities;Patient/family education    PT Goals (Current goals can be found in the Care Plan section)  Acute Rehab PT Goals Patient Stated Goal: go home today PT Goal Formulation: With patient Time For Goal Achievement: 01/17/24 Potential to Achieve Goals: Good    Frequency Min 5X/week     Co-evaluation               AM-PAC PT 6 Clicks Mobility  Outcome Measure Help needed turning from your back to your side while in a flat bed without using bedrails?: A Little Help needed moving from lying on your back to sitting on the side of a flat bed without using bedrails?: A Little Help needed moving to and from a bed to a chair (including a wheelchair)?: A Little Help needed standing up from a chair using your arms (e.g., wheelchair or bedside chair)?: A Little Help needed to walk in hospital room?: A Little Help needed climbing 3-5 steps with a railing? : A Little 6 Click Score: 18    End of Session Equipment Utilized During Treatment: Back brace Activity Tolerance: Patient limited by pain Patient left: in bed;with call bell/phone within reach;with family/visitor present Nurse Communication: Mobility status;Other (comment) (DME needs, HH needs) PT  Visit Diagnosis: Other abnormalities of gait and mobility (R26.89);Pain Pain - Right/Left: Left Pain - part of body: Leg (and back)    Time: 9076-9055 PT Time Calculation (min) (ACUTE ONLY): 21 min   Charges:   PT Evaluation $PT Eval Low Complexity: 1 Low   PT General Charges $$ ACUTE PT VISIT: 1 Visit          Macario RAMAN, PT Acute Rehabilitation Services  Office [phone]   Macario SHAUNNA Soja 01/03/2024, 9:59 AM

## 2024-01-03 NOTE — Evaluation (Signed)
 Occupational Therapy Evaluation Patient Details Name: [name] MRN: [id number] DOB: [birth year] Today's Date: 01/03/2024   History of Present Illness   Pt is a 63 y.o. male s/p bil PLIF L4-5. PMH: afib, sickle cell, arthritis, COPD, GERD, GIB, HTN, SVT, sleep apnea , ACDF     Clinical Impressions PTA, pt lived alone and reports being independent. Significant other in room and supportive throughout sesssion. Upon eval, pt performing UB ADL with set-up and LB ADL with up to mod A. Pt educated and demonstrating use of compensatory techniques for bed mobility, LB ADL, grooming, toileting, and shower transfers within precautions. Will continue to follow acutely.        If plan is discharge home, recommend the following:   A little help with walking and/or transfers;A little help with bathing/dressing/bathroom;Assistance with cooking/housework;Assist for transportation;Help with stairs or ramp for entrance     Functional Status Assessment   Patient has had a recent decline in their functional status and demonstrates the ability to make significant improvements in function in a reasonable and predictable amount of time.     Equipment Recommendations   BSC/3in1;Other (comment) (RW)     Recommendations for Other Services   PT consult     Precautions/Restrictions   Precautions Precautions: Back Precaution Booklet Issued: Yes (comment) Recall of Precautions/Restrictions: Intact Required Braces or Orthoses: Spinal Brace Spinal Brace: Lumbar corset;Applied in sitting position Restrictions Weight Bearing Restrictions Per Provider Order: No     Mobility Bed Mobility Overal bed mobility: Needs Assistance Bed Mobility: Rolling, Sidelying to Sit, Sit to Sidelying Rolling: Contact guard assist Sidelying to sit: Contact guard assist, HOB elevated, Used rails     Sit to sidelying: Min assist (assist for LEs into bed)      Transfers Overall transfer level: Needs  assistance Equipment used: Rolling walker (2 wheels) Transfers: Sit to/from Stand Sit to Stand: From elevated surface, Contact guard assist, Mod assist           General transfer comment: pt was mod A up from EOB at lowest height to simulate toilet transfer. Up from elevated EOB, pt was CGA      Balance Overall balance assessment: Needs assistance Sitting-balance support: Single extremity supported, Bilateral upper extremity supported, No upper extremity supported, Feet supported Sitting balance-Leahy Scale: Fair     Standing balance support: Single extremity supported, Bilateral upper extremity supported, During functional activity, Reliant on assistive device for balance Standing balance-Leahy Scale: Poor                             ADL either performed or assessed with clinical judgement   ADL Overall ADL's : Needs assistance/impaired Eating/Feeding: Set up;Sitting   Grooming: Contact guard assist;Standing Grooming Details (indicate cue type and reason): one UE support on table Upper Body Bathing: Set up;Sitting   Lower Body Bathing: Moderate assistance;Sit to/from stand   Upper Body Dressing : Set up;Sitting   Lower Body Dressing: Moderate assistance;Sit to/from stand Lower Body Dressing Details (indicate cue type and reason): CGA for pants from elevated surface Toilet Transfer: Contact guard assist;Ambulation;Rolling walker (2 wheels)     Toileting - Clothing Manipulation Details (indicate cue type and reason): reviewed compensatory techniques Tub/ Shower Transfer: Tub transfer;Minimal assistance;Ambulation;BSC/3in1;Rolling walker (2 wheels)   Functional mobility during ADLs: Contact guard assist;Rolling walker (2 wheels)       Vision   Vision Assessment?: No apparent visual deficits     Perception Perception:  Not tested       Praxis Praxis: Not tested       Pertinent Vitals/Pain Pain Assessment Pain Assessment: Faces Faces Pain Scale:  Hurts even more Pain Location: back, LLE Pain Descriptors / Indicators: Aching, Discomfort, Grimacing, Guarding, Operative site guarding Pain Intervention(s): Limited activity within patient's tolerance, Monitored during session     Extremity/Trunk Assessment Upper Extremity Assessment Upper Extremity Assessment: Overall WFL for tasks assessed;Right hand dominant   Lower Extremity Assessment Lower Extremity Assessment: Defer to PT evaluation   Cervical / Trunk Assessment Cervical / Trunk Assessment: Back Surgery   Communication Communication Communication: No apparent difficulties   Cognition Arousal: Alert Behavior During Therapy: WFL for tasks assessed/performed Cognition: No apparent impairments                               Following commands: Intact       Cueing  General Comments   Cueing Techniques: Verbal cues;Gestural cues  poor standing posture with hunch in back, with slight bend in back and BLEs   Exercises     Shoulder Instructions      Home Living Family/patient expects to be discharged to:: Private residence Living Arrangements: Alone Available Help at Discharge: Other (Comment) (significant other) Type of Home: House Home Access: Stairs to enter [employer] of Steps: threshold   Home Layout: One level     Bathroom Shower/Tub: Chief Strategy Officer: Standard Bathroom Accessibility: Yes How Accessible: Accessible via walker Home Equipment: None          Prior Functioning/Environment Prior Level of Function : Independent/Modified Independent             Mobility Comments: no AD ADLs Comments: independent per report    OT Problem List: Decreased strength;Decreased activity tolerance;Impaired balance (sitting and/or standing);Decreased knowledge of use of DME or AE;Decreased knowledge of precautions;Pain   OT Treatment/Interventions: Self-care/ADL training;Therapeutic exercise;DME and/or AE  instruction;Therapeutic activities;Patient/family education;Balance training      OT Goals(Current goals can be found in the care plan section)   Acute Rehab OT Goals Patient Stated Goal: get better OT Goal Formulation: With patient Time For Goal Achievement: 01/17/24 Potential to Achieve Goals: Good   OT Frequency:  Min 2X/week    Co-evaluation              AM-PAC OT 6 Clicks Daily Activity     Outcome Measure Help from another person eating meals?: None Help from another person taking care of personal grooming?: A Little Help from another person toileting, which includes using toliet, bedpan, or urinal?: A Little Help from another person bathing (including washing, rinsing, drying)?: A Little Help from another person to put on and taking off regular upper body clothing?: A Little Help from another person to put on and taking off regular lower body clothing?: A Lot 6 Click Score: 18   End of Session Equipment Utilized During Treatment: Gait belt;Rolling walker (2 wheels);Back brace Nurse Communication: Mobility status  Activity Tolerance: Patient tolerated treatment well Patient left: in bed;with call bell/phone within reach;with family/visitor present  OT Visit Diagnosis: Unsteadiness on feet (R26.81);Muscle weakness (generalized) (M62.81);Pain                Time: 9255-9184 OT Time Calculation (min): 31 min Charges:  OT General Charges $OT Visit: 1 Visit OT Evaluation $OT Eval Low Complexity: 1 Low OT Treatments $Self Care/Home Management : 8-22 mins  Novalynn Branaman D  Lebron MEMO, OTR/L [facility] Acute Rehabilitation Office: [phone]   Elma JONETTA Lebron 01/03/2024, 9:27 AM

## 2024-01-06 MED FILL — Heparin Sodium (Porcine) Inj 1000 Unit/ML: INTRAMUSCULAR | Qty: 30 | Status: AC

## 2024-01-06 MED FILL — Sodium Chloride IV Soln 0.9%: INTRAVENOUS | Qty: 2000 | Status: AC

## 2024-04-17 ENCOUNTER — Emergency Department (HOSPITAL_BASED_OUTPATIENT_CLINIC_OR_DEPARTMENT_OTHER)
Admission: EM | Admit: 2024-04-17 | Discharge: 2024-04-17 | Disposition: A | Discharge status: Home / Self Care | Attending: Emergency Medicine | Admitting: Emergency Medicine

## 2024-04-17 ENCOUNTER — Other Ambulatory Visit: Payer: Self-pay

## 2024-04-17 ENCOUNTER — Emergency Department (HOSPITAL_BASED_OUTPATIENT_CLINIC_OR_DEPARTMENT_OTHER)

## 2024-04-17 ENCOUNTER — Encounter (HOSPITAL_BASED_OUTPATIENT_CLINIC_OR_DEPARTMENT_OTHER): Payer: Self-pay | Admitting: Emergency Medicine

## 2024-04-17 DIAGNOSIS — Z79899 Other long term (current) drug therapy: Secondary | ICD-10-CM | POA: Diagnosis not present

## 2024-04-17 DIAGNOSIS — I10 Essential (primary) hypertension: Secondary | ICD-10-CM | POA: Insufficient documentation

## 2024-04-17 DIAGNOSIS — R079 Chest pain, unspecified: Secondary | ICD-10-CM | POA: Insufficient documentation

## 2024-04-17 DIAGNOSIS — M25512 Pain in left shoulder: Secondary | ICD-10-CM | POA: Insufficient documentation

## 2024-04-17 LAB — CBC WITH DIFFERENTIAL/PLATELET
Abs Immature Granulocytes: 0.03 K/uL (ref 0.00–0.07)
Basophils Absolute: 0 K/uL (ref 0.0–0.1)
Basophils Relative: 0 %
Eosinophils Absolute: 0.1 K/uL (ref 0.0–0.5)
Eosinophils Relative: 1 %
HCT: 43.8 % (ref 39.0–52.0)
Hemoglobin: 14.3 g/dL (ref 13.0–17.0)
Immature Granulocytes: 0 %
Lymphocytes Relative: 20 %
Lymphs Abs: 1.8 K/uL (ref 0.7–4.0)
MCH: 27.2 pg (ref 26.0–34.0)
MCHC: 32.6 g/dL (ref 30.0–36.0)
MCV: 83.3 fL (ref 80.0–100.0)
Monocytes Absolute: 0.8 K/uL (ref 0.1–1.0)
Monocytes Relative: 9 %
Neutro Abs: 6.3 K/uL (ref 1.7–7.7)
Neutrophils Relative %: 70 %
Platelets: 237 K/uL (ref 150–400)
RBC: 5.26 MIL/uL (ref 4.22–5.81)
RDW: 13.5 % (ref 11.5–15.5)
WBC: 9 K/uL (ref 4.0–10.5)
nRBC: 0 % (ref 0.0–0.2)

## 2024-04-17 LAB — COMPREHENSIVE METABOLIC PANEL WITH GFR
ALT: 27 U/L (ref 0–44)
AST: 32 U/L (ref 15–41)
Albumin: 4.5 g/dL (ref 3.5–5.0)
Alkaline Phosphatase: 113 U/L (ref 38–126)
Anion gap: 9 (ref 5–15)
BUN: 13 mg/dL (ref 8–23)
CO2: 28 mmol/L (ref 22–32)
Calcium: 9.5 mg/dL (ref 8.9–10.3)
Chloride: 105 mmol/L (ref 98–111)
Creatinine, Ser: 1.19 mg/dL (ref 0.61–1.24)
GFR, Estimated: >60 mL/min
Glucose, Bld: 111 mg/dL — ABNORMAL HIGH (ref 70–99)
Potassium: 3.9 mmol/L (ref 3.5–5.1)
Sodium: 142 mmol/L (ref 135–145)
Total Bilirubin: 0.5 mg/dL (ref 0.0–1.2)
Total Protein: 7.9 g/dL (ref 6.5–8.1)

## 2024-04-17 LAB — TROPONIN T, HIGH SENSITIVITY
Troponin T High Sensitivity: <15 ng/L (ref 0–19)
Troponin T High Sensitivity: <15 ng/L (ref 0–19)

## 2024-04-17 MED ORDER — HYDROMORPHONE HCL 1 MG/ML IJ SOLN
1.0000 mg | Freq: Once | INTRAMUSCULAR | Status: AC
  Administered 2024-04-17: 1 mg via INTRAVENOUS
  Filled 2024-04-17: qty 1

## 2024-04-17 MED ORDER — LOSARTAN POTASSIUM 25 MG PO TABS
50.0000 mg | ORAL_TABLET | Freq: Once | ORAL | Status: AC
  Administered 2024-04-17: 50 mg via ORAL
  Filled 2024-04-17: qty 2

## 2024-04-17 MED ORDER — HYDRALAZINE HCL 20 MG/ML IJ SOLN: 5.0000 mg | Freq: Once | INTRAMUSCULAR | Status: DC

## 2024-04-17 MED ORDER — AMLODIPINE BESYLATE 5 MG PO TABS
5.0000 mg | ORAL_TABLET | Freq: Once | ORAL | Status: AC
  Administered 2024-04-17: 5 mg via ORAL
  Filled 2024-04-17: qty 1

## 2024-04-17 MED ORDER — HYDROMORPHONE HCL 1 MG/ML IJ SOLN: 1.0000 mg | Freq: Once | INTRAMUSCULAR | Status: DC

## 2024-04-17 MED ORDER — HYDRALAZINE HCL 20 MG/ML IJ SOLN
5.0000 mg | Freq: Once | INTRAMUSCULAR | Status: AC
  Administered 2024-04-17: 5 mg via INTRAVENOUS
  Filled 2024-04-17: qty 1

## 2024-04-17 NOTE — ED Notes (Signed)
 Extra urine sample sent to lab at this time.

## 2024-04-17 NOTE — Discharge Instructions (Signed)
 As we discussed, you likely have side effect from steroid injection  I recommend you take double your losartan  (50 mg) daily.  Please continue taking your other meds  Your blood pressure is expected to be elevated for the next week or so.  Please recheck your blood pressure in a week  Follow-up with your doctor  Return to ER if you have worse chest pain or shortness of breath

## 2024-04-17 NOTE — ED Triage Notes (Signed)
 States his BP was elevated at PT yesterday, CP with left arm pain x 2 days . Received a steroid shot for left arm pain x 1 week ago. SOB today

## 2024-04-17 NOTE — ED Notes (Signed)
 Patient transferred from waiting room to ED treatment room. Assuming pt care at this time.

## 2024-04-17 NOTE — ED Provider Notes (Signed)
 " [place] [facility] AT [facility] Provider Note   CSN: [id number] Arrival date & time: 04/17/24  1643     Patient presents with: Chest Pain   [name] is a 64 y.o. male hx of chronic back pain, hypertension, here presenting with chest pain and back pain.  Patient states that he has chronic pain and had steroid injection to the left shoulder area yesterday.  He states that today he has been having worsening left shoulder and chest pain.  He states that he checked his blood pressure and was elevated around 160-170 at home.  Patient states that he takes amlodipine  5 mg and Cozaar  25 mg and took it as prescribed and his pressure has not come down so he came here for further evaluation   The history is provided by the patient.       Prior to Admission medications  Medication Sig Start Date End Date Taking? Authorizing Provider  acetaminophen  (TYLENOL ) 325 MG tablet Take 650 mg by mouth every 6 (six) hours as needed for moderate pain.    [provider]  albuterol  (VENTOLIN  HFA) 108 (90 Base) MCG/ACT inhaler Inhale 1 puff into the lungs as needed for wheezing or shortness of breath. 07/05/16   [provider]  amLODipine  (NORVASC ) 5 MG tablet Take 1 tablet (5 mg total) by mouth daily. 04/30/19 01/02/24  Shona Terry SAILOR, DO  atorvastatin  (LIPITOR) 10 MG tablet Take 10 mg by mouth daily. 08/18/21   [provider]  cetirizine (ZYRTEC) 10 MG tablet Take 10 mg by mouth daily as needed for allergies.     [provider]  colchicine 0.6 MG tablet Take 0.6 mg by mouth daily as needed (gout flare). 01/17/20   [provider]  cyclobenzaprine  (FLEXERIL ) 10 MG tablet Take 1 tablet (10 mg total) by mouth 3 (three) times daily as needed for muscle spasms. 01/03/24   Meyran, Suzen Lacks, NP  diclofenac  (VOLTAREN ) 75 MG EC tablet Take 75 mg by mouth as needed for mild pain (pain score 1-3). 11/20/21   [provider]  ferrous  sulfate 325 (65 FE) MG tablet Take 1 tablet (325 mg total) by mouth daily with breakfast. Patient not taking: Reported on 11/19/2023 04/30/19 11/19/23  Shona Terry SAILOR, DO  fluticasone  (FLONASE ) 50 MCG/ACT nasal spray Place 1 spray into both nostrils daily as needed for allergies or rhinitis.    [provider]  Fluticasone -Salmeterol (ADVAIR) 250-50 MCG/DOSE AEPB Inhale 1 puff into the lungs 2 (two) times daily as needed (wheezing).    [provider]  guaiFENesin  (MUCINEX ) 600 MG 12 hr tablet Take 600 mg by mouth daily as needed for to loosen phlegm. 05/18/21   [provider]  lidocaine  (LIDODERM ) 5 % Place 1 patch onto the skin daily. Remove & Discard patch within 12 hours or as directed by MD Patient not taking: Reported on 11/19/2023 10/09/23   Freddi Hamilton, MD  linaclotide  (LINZESS ) 145 MCG CAPS capsule Take 145 mcg by mouth daily as needed (Constipation). 10/07/19   [provider]  losartan  (COZAAR ) 25 MG tablet Take 25 mg by mouth daily. 10/13/23   [provider]  methylPREDNISolone  (MEDROL  DOSEPAK) 4 MG TBPK tablet Take as directed 01/03/24   Meyran, Suzen Lacks, NP  nitroGLYCERIN  (NITROSTAT ) 0.4 MG SL tablet Place 1 tablet (0.4 mg total) under the tongue every 5 (five) minutes as needed for chest pain. 03/12/13   Singh, Prashant K, MD  NON FORMULARY CPAP  at bedtime    [provider]  Oxcarbazepine  (TRILEPTAL ) 300 MG tablet Take 1 tablet (300 mg total) by mouth in the morning, at noon, and at bedtime. Patient not taking: Reported on 11/19/2023 10/10/21   Onita Duos, MD  oxyCODONE -acetaminophen  (PERCOCET) 10-325 MG tablet Take 1 tablet by mouth every 4 (four) hours as needed for pain. 01/03/24 01/02/25  Meyran, Suzen Lacks, NP  pantoprazole  (PROTONIX ) 40 MG tablet Take 40 mg by mouth daily. 07/28/14   [provider]  polyethylene glycol (MIRALAX  / GLYCOLAX ) 17 g packet Take 17 g by mouth daily. Patient taking differently: Take 17 g  by mouth daily as needed for mild constipation. 04/30/19   Shona Terry SAILOR, DO  Prenatal Vit-Fe Fumarate-FA (WESTAB PLUS) 27-1 MG TABS Take 1 tablet by mouth daily. 08/27/23   [provider]  sildenafil  (REVATIO ) 20 MG tablet Take 60-100 mg by mouth daily as needed (ED). 11/25/18   [provider]  sotalol  (BETAPACE ) 80 MG tablet Take 80 mg by mouth every 12 (twelve) hours. 09/21/21   [provider]  SUMAtriptan  (IMITREX ) 100 MG tablet Take 100 mg by mouth as directed. Take one tablet at onset, then an additional tablet every 12 hours as needed for migraine 02/04/20   [provider]  tamsulosin  (FLOMAX ) 0.4 MG CAPS capsule Take 0.4 mg by mouth daily. 08/05/23   [provider]  triamterene -hydrochlorothiazide  (DYAZIDE ) 37.5-25 MG capsule Take 1 each (1 capsule total) by mouth every morning. Patient not taking: Reported on 11/19/2023 08/22/21   Briana Elgin LABOR, MD    Allergies: Patient has no known allergies.    Review of Systems  Cardiovascular:  Positive for chest pain.  All other systems reviewed and are negative.   Updated Vital Signs BP (!) 199/106   Pulse 71   Temp 98.1 F (36.7 C) (Oral)   Resp 18   Ht 5' 11 (1.803 m)   Wt 93 kg   SpO2 98%   BMI 28.59 kg/m   Physical Exam Vitals and nursing note reviewed.  Constitutional:      Comments: Uncomfortable  HENT:     Head: Normocephalic.  Eyes:     Extraocular Movements: Extraocular movements intact.     Pupils: Pupils are equal, round, and reactive to light.  Cardiovascular:     Rate and Rhythm: Normal rate and regular rhythm.     Heart sounds: Normal heart sounds.  Pulmonary:     Effort: Pulmonary effort is normal.     Breath sounds: Normal breath sounds.  Abdominal:     General: Bowel sounds are normal.     Palpations: Abdomen is soft.  Musculoskeletal:        General: Normal range of motion.     Cervical back: Normal range of motion and neck supple.  Skin:    General:  Skin is warm.     Capillary Refill: Capillary refill takes less than 2 seconds.  Neurological:     General: No focal deficit present.     Mental Status: He is alert and oriented to person, place, and time.  Psychiatric:        Mood and Affect: Mood normal.        Behavior: Behavior normal.     (all labs ordered are listed, but only abnormal results are displayed) Labs Reviewed  CBC WITH DIFFERENTIAL/PLATELET  COMPREHENSIVE METABOLIC PANEL WITH GFR  TROPONIN T, HIGH SENSITIVITY    EKG: None  Radiology: No results found.  Procedures   Medications Ordered in the ED  amLODipine  (NORVASC ) tablet 5 mg (has no administration in time range)  losartan  (COZAAR ) tablet 50 mg (has no administration in time range)  HYDROmorphone  (DILAUDID ) injection 1 mg (has no administration in time range)                                    Medical Decision Making [name] is a 64 y.o. male here presenting with left shoulder pain and chest pain after steroid injection yesterday.  This is likely side effect of steroid injection.  I have low suspicion for ACS or dissection.  Plan to get CBC and CMP and troponin x 2 and chest x-ray.  Will give pain medicine and home BP meds.  7:53 PM I reviewed patient's labs and troponin are negative x 2.  Chest x-ray is clear.  Patient was given extra dose of his BP meds and blood pressure improved to the 170s from 200.  Told him to increase his losartan  to 50 mg daily from 25 mg.  Told him that his blood pressure may be elevated for the next week or so.  Gave strict return precautions.  Amount and/or Complexity of Data Reviewed Labs: ordered. Radiology: ordered. ECG/medicine tests: ordered.  Risk Prescription drug management.     Final diagnoses:  None    ED Discharge Orders     None          Bobby Alm Macho, MD 04/17/24 1955  "
